# Patient Record
Sex: Male | Born: 1965
Health system: Southern US, Community
[De-identification: ages and names within clinical notes are randomized; demographics above are authoritative.]

## PROBLEM LIST (undated history)

## (undated) DIAGNOSIS — M549 Dorsalgia, unspecified: Secondary | ICD-10-CM

## (undated) DIAGNOSIS — I509 Heart failure, unspecified: Secondary | ICD-10-CM

## (undated) HISTORY — PX: CARDIAC SURGERY: SHX584

---

## 2006-12-07 ENCOUNTER — Inpatient Hospital Stay (HOSPITAL_COMMUNITY): Admission: EM | Admit: 2006-12-07 | Discharge: 2006-12-11 | Payer: Self-pay | Admitting: Emergency Medicine

## 2007-05-19 ENCOUNTER — Emergency Department (HOSPITAL_COMMUNITY): Admission: EM | Admit: 2007-05-19 | Discharge: 2007-05-19 | Payer: Self-pay | Admitting: Emergency Medicine

## 2007-07-08 ENCOUNTER — Emergency Department (HOSPITAL_COMMUNITY): Admission: EM | Admit: 2007-07-08 | Discharge: 2007-07-08 | Payer: Self-pay | Admitting: Emergency Medicine

## 2007-07-10 ENCOUNTER — Emergency Department (HOSPITAL_COMMUNITY): Admission: EM | Admit: 2007-07-10 | Discharge: 2007-07-10 | Payer: Self-pay | Admitting: Emergency Medicine

## 2007-10-11 ENCOUNTER — Emergency Department (HOSPITAL_COMMUNITY): Admission: EM | Admit: 2007-10-11 | Discharge: 2007-10-12 | Payer: Self-pay | Admitting: Emergency Medicine

## 2007-10-13 ENCOUNTER — Emergency Department (HOSPITAL_COMMUNITY): Admission: EM | Admit: 2007-10-13 | Discharge: 2007-10-13 | Payer: Self-pay | Admitting: Emergency Medicine

## 2008-02-02 ENCOUNTER — Emergency Department (HOSPITAL_COMMUNITY): Admission: EM | Admit: 2008-02-02 | Discharge: 2008-02-02 | Payer: Self-pay | Admitting: Emergency Medicine

## 2008-02-04 ENCOUNTER — Emergency Department (HOSPITAL_COMMUNITY): Admission: EM | Admit: 2008-02-04 | Discharge: 2008-02-04 | Payer: Self-pay | Admitting: Emergency Medicine

## 2008-02-07 ENCOUNTER — Emergency Department (HOSPITAL_COMMUNITY): Admission: EM | Admit: 2008-02-07 | Discharge: 2008-02-07 | Payer: Self-pay | Admitting: Emergency Medicine

## 2010-09-10 NOTE — H&P (Signed)
[name], [name]                ACCOUNT NO.:  [id number]   MEDICAL RECORD NO.:  [id number]          PATIENT TYPE:  INP   LOCATION:  5501                         FACILITY:  [facility]   PHYSICIAN:  Jonathan King, MDDATE OF BIRTH:  [birth year]   DATE OF ADMISSION:  12/07/2006  DATE OF DISCHARGE:                              HISTORY & PHYSICAL   CHIEF COMPLAINT:  Spider bite.   HISTORY OF PRESENT ILLNESS:  Jonathan King is a 45 year old relatively  healthy white male who presents to the emergency room after having  noticed an infection of his left knee several days ago. He thinks he may  have had a spider bite. His friend drained a fluid collection with a  needle after having put it over a sling.  Apparently a lot of blood and  pus was expressed at that time. Since then his knee has gotten worse.  The redness and pain has spread almost circumferentially and more  proximally than the initial area.  He is having nausea and subjective  fevers and chills. He thinks he goes to the [facility]  [facility] but cannot give me a physician's name.  He reports that  he goes to a different one every time. He has had no previous soft  tissue infections, nor joint infections. He has not seen a physician for  this problem.  He  does not recall the last time he received a tetanus  shot and is requesting a booster today.   PAST MEDICAL HISTORY:  Anxiety.   MEDICATIONS:  None.   ALLERGIES:  None.   SOCIAL HISTORY:  The patient is currently unemployed.  He drinks  occasionally but denies drinking to excess.  He does not smoke.  Denies  drug use.  He is here with, I believe, his girlfriend who reports she is  HIV positive.   REVIEW OF SYSTEMS:  CONSTITUTIONAL:  As above.  HEENT: No headache or  sore throat.  RESPIRATORY:  No shortness of breath or cough.  CARDIOVASCULAR:  No chest pains or palpitations.  GI:  As above.  GU:  No dysuria.  MUSCULOSKELETAL:  As above. SKIN:  As  above.  PSYCHIATRIC.  He is reporting feeling very panicky currently.  NEUROLOGIC:  No stroke  or seizure. ENDOCRINE:  No diabetes.  HEMATOLOGIC:  No bleeding or  clotting disorders. INFECTIOUS:  He does not know his HIV status  currently.   PHYSICAL EXAMINATION:  His temperature is 99.6 orally, blood pressure  132/93, pulse 118, respiratory rate 24, oxygen saturation 96% on room  air.  GENERAL:  The patient appears anxious.  He has a stutter.  HEENT:  Normocephalic, atraumatic.  Pupils equal, round, and reactive to  light.  NECK:  Supple.  LUNGS:  Clear to auscultation bilaterally without wheezes, rhonchi or  rales.  CARDIOVASCULAR:  Regular rate and rhythm without murmurs, gallops or  rubs.  ABDOMEN:  Normal bowel sounds, soft, nontender, nondistended.  GU/RECTAL:  Deferred.  EXTREMITIES:  He has a papule just distal to his knee with surrounding  area of intense induration and  erythema, warmth and tenderness. There is  also some erythema that expands distally and proximally, worse on the  front of the knee but extending in a patchy fashion posteriorly to the  popliteal area. I see no fluctuance. He has good range of motion of the  knee and no obvious effusion.  SKIN:  As above.  PSYCHIATRIC:  As above.  NEUROLOGIC:  Alert and oriented.  Cranial nerves and sensory motor exam  are intact.   LABS:  Have not been drawn by the ER physician. Radiology none.   ASSESSMENT AND PLAN:  1. Cellulitis of the left knee. I am concerned about an abscess, less      likely infected joint.  I will get an MRI of the knee and cover      with vancomycin IV and supportive care. I am in the hallway      currently and therefore feel it is not appropriate to discuss      getting an HIV test. The friend did actually volunteer the      information. Once he is in a more private area, I will consider      screening for need for HIV test.  2. Anxiety, p.r.n. Ativan.   In addition to above, he will need  a baseline CBC and BMET.      Jonathan L. Lendell Caprice, MD  Electronically Signed     CLS/MEDQ  D:  12/08/2006  T:  12/09/2006  Job:  [id number]   cc:   [facility], [facility]

## 2010-09-13 NOTE — Discharge Summary (Signed)
Jonathan King, Jonathan King                ACCOUNT NO.:  [id number]   MEDICAL RECORD NO.:  [id number]          PATIENT TYPE:  INP   LOCATION:  5501                         FACILITY:  [facility]   PHYSICIAN:  Barnetta Chapel, MDDATE OF BIRTH:  1965/12/04   DATE OF ADMISSION:  12/07/2006  DATE OF DISCHARGE:  12/11/2006                               DISCHARGE SUMMARY   PRIMARY CARE PHYSICIAN:  Media planner, [employer].   DISCHARGE DIAGNOSES:  Abscess/cellulitis of the anterior aspect of the  left knee status post I&D.   DISCHARGE MEDICATIONS:  1. Doxycycline 100 mg twice daily.  2. Vicodin 5/325 mg x1-2 tabs every 4 h as needed for pain.   CONSULTATIONS:  Surgical consult.   PROCEDURE:  I&D of left anterior knee abscess (this was done by the  surgical team).   IMAGING STUDIES:  The patient had an MRI of the left knee which revealed  cellulitic changes involving the anterior aspect of the knee with the  small foci of nonenhancing soft tissue, which may reflect an area of  phlegmon or necrotic tissue, but no discreet drainable soft tissue  abscess. No findings to suggest osteomyelitis. No internal derangement  of findings for septic arthritis.   BRIEF HISTORY AND HOSPITAL COURSE:  Please refer to the H&P done on  December 07, 2006. The patient is a 45 year old male who presented with an  abscess and cellulitis of the anterior aspect of the left knee following  an insect bite.   The patient was admitted to the regular medical floor. He was started on  IV antibiotics (IV Vancomycin). An MRI of the left knee was done (please  see above for the findings). A surgical consult was called for the  possible drainage of the abscess. The patient was seen by the surgical  team and had an I&D done on the left knee. The wound dressing was  continued as per the surgeon's advice. The patient's antibiotics were  continued. The patient was eventually discharged back home on p.o.  doxycycline.  The wound culture results were still pending as of the time  of discharge.   PLAN:  1. Discharge patient home on p.o. doxycycline.  2. Home health for wound dressing.  3. Activity as tolerated.  4. Regular diet.     Barnetta Chapel, MD  Electronically Signed    SIO/MEDQ  D:  01/06/2007  T:  01/06/2007  Job:  [id number]

## 2011-02-10 LAB — BASIC METABOLIC PANEL
BUN: 8
CO2: 29
Calcium: 9.5
Chloride: 102
Creatinine, Ser: 1
GFR calc Af Amer: >60
GFR calc non Af Amer: >60
Glucose, Bld: 118 — ABNORMAL HIGH
Potassium: 3.8
Sodium: 138

## 2011-02-10 LAB — DIFFERENTIAL
Basophils Absolute: 0
Basophils Relative: 0
Eosinophils Absolute: 0.1
Eosinophils Relative: 1
Lymphocytes Relative: 14
Lymphs Abs: 1.8
Monocytes Absolute: 1 — ABNORMAL HIGH
Monocytes Relative: 8
Neutro Abs: 9.9 — ABNORMAL HIGH
Neutrophils Relative %: 77

## 2011-02-10 LAB — WOUND CULTURE

## 2011-02-10 LAB — CULTURE, BLOOD (ROUTINE X 2): Culture: NO GROWTH

## 2011-02-10 LAB — CBC
HCT: 45.4
Hemoglobin: 15.5
MCHC: 34.1
MCV: 89.6
Platelets: 253
RBC: 5.07
RDW: 12.7
WBC: 12.9 — ABNORMAL HIGH

## 2012-01-25 ENCOUNTER — Emergency Department (HOSPITAL_COMMUNITY)
Admission: EM | Admit: 2012-01-25 | Discharge: 2012-01-25 | Disposition: A | Discharge status: Home / Self Care | Payer: Self-pay | Attending: Emergency Medicine | Admitting: Emergency Medicine

## 2012-01-25 ENCOUNTER — Encounter (HOSPITAL_COMMUNITY): Payer: Self-pay | Admitting: Emergency Medicine

## 2012-01-25 ENCOUNTER — Emergency Department (HOSPITAL_COMMUNITY): Payer: Self-pay

## 2012-01-25 DIAGNOSIS — M549 Dorsalgia, unspecified: Secondary | ICD-10-CM | POA: Insufficient documentation

## 2012-01-25 MED ORDER — KETOROLAC TROMETHAMINE 60 MG/2ML IM SOLN
60.0000 mg | Freq: Once | INTRAMUSCULAR | Status: AC
  Administered 2012-01-25: 60 mg via INTRAMUSCULAR
  Filled 2012-01-25: qty 2

## 2012-01-25 MED ORDER — CYCLOBENZAPRINE HCL 10 MG PO TABS: 10.0000 mg | ORAL_TABLET | Freq: Two times a day (BID) | ORAL | Status: DC | PRN

## 2012-01-25 MED ORDER — OXYCODONE-ACETAMINOPHEN 5-325 MG PO TABS: 1.0000 | ORAL_TABLET | ORAL | Status: DC | PRN

## 2012-01-25 NOTE — ED Provider Notes (Signed)
History     CSN: [id number]  Arrival date & time 01/25/12  0349   None     Chief Complaint  Patient presents with  . Back Pain    (Consider location/radiation/quality/duration/timing/severity/associated sxs/prior treatment) Patient is a 46 y.o. male presenting with back pain. The history is provided by the patient, a friend and the EMS personnel. No language interpreter was used.  Back Pain  This is a new problem. The current episode started 12 to 24 hours ago. The problem occurs constantly. The pain is associated with no known injury. The pain is present in the thoracic spine. The pain is at a severity of 8/10. The pain is moderate. The symptoms are aggravated by bending. Pertinent negatives include no chest pain, no fever, no numbness, no bowel incontinence, no perianal numbness, no bladder incontinence, no leg pain, no paresthesias, no paresis, no tingling and no weakness. He has tried NSAIDs for the symptoms.   46 year old male coming in with complaint of midthoracic pain that started around 10:30 PM last night. Denies shortness of breath or chest pain. States the pain is worse with movement and palpitation to the thoracic spine. He denies injury. He denies bowel or bladder incontinence. Patient has taken ibuprofen 1000 mg for pain. States that he did vomit x1. States that he is hungry right now and would like to eat. Patient sits up in the bed with no problems for exam. Ambulating without any problems.  History reviewed. No pertinent past medical history.  History reviewed. No pertinent past surgical history.  No family history on file.  History  Substance Use Topics  . Smoking status: Never Smoker   . Smokeless tobacco: Not on file  . Alcohol Use: No      Review of Systems  Constitutional: Negative for fever.  Cardiovascular: Negative for chest pain.  Gastrointestinal: Negative for bowel incontinence.  Genitourinary: Negative for bladder incontinence.  Musculoskeletal:  Positive for back pain.  Neurological: Negative for tingling, weakness, numbness and paresthesias.    Allergies  Review of patient's allergies indicates no known allergies.  Home Medications   Current Outpatient Rx  Name Route Sig Dispense Refill  . IBUPROFEN 200 MG PO TABS Oral Take 800 mg by mouth every 6 (six) hours as needed. For pain      BP 150/99  Pulse 66  Temp 98 F (36.7 C) (Oral)  Resp 18  Ht 6' (1.829 m)  Wt 185 lb (83.915 kg)  BMI 25.09 kg/m2  SpO2 93%  Physical Exam  Nursing note and vitals reviewed. Constitutional: He is oriented to person, place, and time. He appears well-developed and well-nourished.  HENT:  Head: Normocephalic.  Eyes: Conjunctivae normal and EOM are normal. Pupils are equal, round, and reactive to light.  Neck: Normal range of motion. Neck supple.  Cardiovascular: Normal rate.   Pulmonary/Chest: Effort normal.  Abdominal: Soft.  Musculoskeletal: Normal range of motion. He exhibits tenderness. He exhibits no edema.  Neurological: He is alert and oriented to person, place, and time.  Skin: Skin is warm and dry.  Psychiatric: He has a normal mood and affect.    ED Course  Procedures (including critical care time)  Labs Reviewed - No data to display No results found.   No diagnosis found.    MDM  46 year old male with acute onset of point tenderness to thoracic spine around 10:30 last night. Thoracic films negative for acute process in reviewed by myself. Toradol IM in the ER with prescription for Percocet  and Flexeril and instructed not to drive with him. No red flags. No cauda equina symptoms. Patient understands to return for worsening symptoms.        Remi Haggard, NP 01/25/12 [phone]

## 2012-01-25 NOTE — ED Notes (Signed)
Pt states he began having back pain late last night. Pt denies any trauma or injury. States that he has occasional back pain but never this bad.

## 2012-01-25 NOTE — ED Notes (Signed)
Pt c/o mid back pain onset last night, denies injury. Worse with movement.

## 2012-01-27 NOTE — ED Provider Notes (Signed)
Medical screening examination/treatment/procedure(s) were performed by non-physician practitioner and as supervising physician I was immediately available for consultation/collaboration.   Nadeem Romanoski, MD 01/27/12 1227 

## 2012-01-30 ENCOUNTER — Encounter (HOSPITAL_COMMUNITY): Payer: Self-pay | Admitting: Emergency Medicine

## 2012-01-30 ENCOUNTER — Emergency Department (INDEPENDENT_AMBULATORY_CARE_PROVIDER_SITE_OTHER)
Admission: EM | Admit: 2012-01-30 | Discharge: 2012-01-30 | Disposition: A | Discharge status: Nursing Home (Includes ICF) | Payer: Self-pay | Source: Home / Self Care | Attending: Emergency Medicine | Admitting: Emergency Medicine

## 2012-01-30 ENCOUNTER — Inpatient Hospital Stay (HOSPITAL_COMMUNITY)
Admission: EM | Admit: 2012-01-30 | Discharge: 2012-02-08 | DRG: 418 | Disposition: A | Discharge status: Home / Self Care | Payer: MEDICAID | Attending: General Surgery | Admitting: General Surgery

## 2012-01-30 ENCOUNTER — Observation Stay (HOSPITAL_COMMUNITY): Payer: Self-pay

## 2012-01-30 ENCOUNTER — Emergency Department (HOSPITAL_COMMUNITY): Payer: Self-pay

## 2012-01-30 DIAGNOSIS — K560 Paralytic ileus: Secondary | ICD-10-CM | POA: Diagnosis not present

## 2012-01-30 DIAGNOSIS — K810 Acute cholecystitis: Secondary | ICD-10-CM

## 2012-01-30 DIAGNOSIS — K929 Disease of digestive system, unspecified: Secondary | ICD-10-CM | POA: Diagnosis not present

## 2012-01-30 DIAGNOSIS — R112 Nausea with vomiting, unspecified: Secondary | ICD-10-CM

## 2012-01-30 DIAGNOSIS — K8000 Calculus of gallbladder with acute cholecystitis without obstruction: Principal | ICD-10-CM | POA: Diagnosis present

## 2012-01-30 DIAGNOSIS — R1011 Right upper quadrant pain: Secondary | ICD-10-CM

## 2012-01-30 DIAGNOSIS — Y836 Removal of other organ (partial) (total) as the cause of abnormal reaction of the patient, or of later complication, without mention of misadventure at the time of the procedure: Secondary | ICD-10-CM | POA: Diagnosis not present

## 2012-01-30 HISTORY — DX: Dorsalgia, unspecified: M54.9

## 2012-01-30 LAB — BASIC METABOLIC PANEL
BUN: 15 mg/dL (ref 6–23)
CO2: 27 mEq/L (ref 19–32)
Calcium: 9.3 mg/dL (ref 8.4–10.5)
Chloride: 92 mEq/L — ABNORMAL LOW (ref 96–112)
Creatinine, Ser: 0.85 mg/dL (ref 0.50–1.35)
GFR calc Af Amer: >90 mL/min (ref >90)
GFR calc non Af Amer: >90 mL/min (ref >90)
Glucose, Bld: 98 mg/dL (ref 70–99)
Potassium: 3 mEq/L — ABNORMAL LOW (ref 3.5–5.1)
Sodium: 132 mEq/L — ABNORMAL LOW (ref 135–145)

## 2012-01-30 LAB — URINE MICROSCOPIC-ADD ON

## 2012-01-30 LAB — CBC WITH DIFFERENTIAL/PLATELET
Basophils Absolute: 0.1 10*3/uL (ref 0.0–0.1)
Basophils Relative: 0 % (ref 0–1)
Eosinophils Absolute: 0.1 10*3/uL (ref 0.0–0.7)
Eosinophils Relative: 0 % (ref 0–5)
HCT: 43.4 % (ref 39.0–52.0)
Hemoglobin: 15.1 g/dL (ref 13.0–17.0)
Lymphocytes Relative: 9 % — ABNORMAL LOW (ref 12–46)
Lymphs Abs: 1.5 10*3/uL (ref 0.7–4.0)
MCH: 30.7 pg (ref 26.0–34.0)
MCHC: 34.8 g/dL (ref 30.0–36.0)
MCV: 88.2 fL (ref 78.0–100.0)
Monocytes Absolute: 1.5 10*3/uL — ABNORMAL HIGH (ref 0.1–1.0)
Monocytes Relative: 9 % (ref 3–12)
Neutro Abs: 13 10*3/uL — ABNORMAL HIGH (ref 1.7–7.7)
Neutrophils Relative %: 81 % — ABNORMAL HIGH (ref 43–77)
Platelets: 256 10*3/uL (ref 150–400)
RBC: 4.92 MIL/uL (ref 4.22–5.81)
RDW: 13.4 % (ref 11.5–15.5)
WBC: 16.1 10*3/uL — ABNORMAL HIGH (ref 4.0–10.5)

## 2012-01-30 LAB — URINALYSIS, ROUTINE W REFLEX MICROSCOPIC
Glucose, UA: NEGATIVE mg/dL
Hgb urine dipstick: NEGATIVE
Ketones, ur: 15 mg/dL — AB
Leukocytes, UA: NEGATIVE
Nitrite: NEGATIVE
Protein, ur: 100 mg/dL — AB
Specific Gravity, Urine: 1.034 — ABNORMAL HIGH (ref 1.005–1.030)
Urobilinogen, UA: 2 mg/dL — ABNORMAL HIGH (ref 0.0–1.0)
pH: 6 (ref 5.0–8.0)

## 2012-01-30 MED ORDER — POTASSIUM CHLORIDE IN NACL 20-0.9 MEQ/L-% IV SOLN
INTRAVENOUS | Status: DC
  Administered 2012-01-31: 125 mL/h via INTRAVENOUS
  Administered 2012-02-01 – 2012-02-03 (×5): via INTRAVENOUS
  Administered 2012-02-04: 75 mL/h via INTRAVENOUS
  Administered 2012-02-04: 03:00:00 via INTRAVENOUS
  Administered 2012-02-04: 75 mL/h via INTRAVENOUS
  Administered 2012-02-05: via INTRAVENOUS
  Filled 2012-01-30 (×18): qty 1000

## 2012-01-30 MED ORDER — OXYCODONE HCL 5 MG PO TABS: 5.0000 mg | ORAL_TABLET | ORAL | Status: DC | PRN

## 2012-01-30 MED ORDER — ACETAMINOPHEN 325 MG PO TABS
975.0000 mg | ORAL_TABLET | Freq: Once | ORAL | Status: AC
  Administered 2012-01-30: 975 mg via ORAL
  Filled 2012-01-30: qty 3

## 2012-01-30 MED ORDER — SODIUM CHLORIDE 0.9 % IV SOLN
3.0000 g | Freq: Once | INTRAVENOUS | Status: AC
  Administered 2012-01-30: 3 g via INTRAVENOUS
  Filled 2012-01-30: qty 3

## 2012-01-30 MED ORDER — ACETAMINOPHEN 650 MG RE SUPP: 650.0000 mg | Freq: Four times a day (QID) | RECTAL | Status: DC | PRN

## 2012-01-30 MED ORDER — HYDROMORPHONE HCL PF 1 MG/ML IJ SOLN
1.0000 mg | INTRAMUSCULAR | Status: DC | PRN
  Administered 2012-01-31: 1 mg via INTRAVENOUS
  Filled 2012-01-30: qty 1

## 2012-01-30 MED ORDER — PANTOPRAZOLE SODIUM 40 MG IV SOLR
40.0000 mg | Freq: Every day | INTRAVENOUS | Status: DC
  Administered 2012-01-31 – 2012-02-06 (×8): 40 mg via INTRAVENOUS
  Filled 2012-01-30 (×10): qty 40

## 2012-01-30 MED ORDER — ACETAMINOPHEN 325 MG PO TABS
650.0000 mg | ORAL_TABLET | Freq: Four times a day (QID) | ORAL | Status: DC | PRN
  Administered 2012-02-01: 650 mg via ORAL
  Filled 2012-01-30: qty 2

## 2012-01-30 MED ORDER — ONDANSETRON HCL 4 MG/2ML IJ SOLN
4.0000 mg | Freq: Four times a day (QID) | INTRAMUSCULAR | Status: DC | PRN
  Administered 2012-02-02: 4 mg via INTRAVENOUS
  Filled 2012-01-30: qty 2

## 2012-01-30 MED ORDER — ONDANSETRON 4 MG PO TBDP
8.0000 mg | ORAL_TABLET | Freq: Once | ORAL | Status: AC
  Administered 2012-01-30: 8 mg via ORAL

## 2012-01-30 MED ORDER — PIPERACILLIN-TAZOBACTAM 3.375 G IVPB
3.3750 g | Freq: Three times a day (TID) | INTRAVENOUS | Status: DC
  Administered 2012-01-31 – 2012-02-07 (×21): 3.375 g via INTRAVENOUS
  Filled 2012-01-30 (×28): qty 50

## 2012-01-30 MED ORDER — ONDANSETRON 4 MG PO TBDP
ORAL_TABLET | ORAL | Status: AC
  Filled 2012-01-30: qty 2

## 2012-01-30 MED ORDER — SODIUM CHLORIDE 0.9 % IV SOLN
Freq: Once | INTRAVENOUS | Status: AC
  Administered 2012-01-30: 18:00:00 via INTRAVENOUS

## 2012-01-30 NOTE — ED Notes (Signed)
Pt sent here from [facility] for further eval of RLQ pain with N/V starting Sunday

## 2012-01-30 NOTE — ED Notes (Addendum)
C/O N/V since Sunday. Was seen early Sunday am at [facility] for back pain. This started after leaving the ED. Has had N/V and mild right flank pain. States flank pain has improved. Denies diarrhea or constipation.

## 2012-01-30 NOTE — ED Provider Notes (Signed)
History     CSN: [id number]  Arrival date & time 01/30/12  1109   First MD Initiated Contact with Patient 01/30/12 1327      Chief Complaint  Patient presents with  . Emesis    (Consider location/radiation/quality/duration/timing/severity/associated sxs/prior treatment) Patient is a 46 y.o. male presenting with vomiting. The history is provided by the patient.  Emesis  Associated symptoms include abdominal pain. Pertinent negatives include no chills and no diarrhea.  Patient reports decreased appetite with vomiting since Sunday.  States evaluated in ER on 01/25/12 and was prescribed flexeril and percocet for back pain.  Pt states back pain resolved, but now has right sided pain with continued nausea and vomiting.  Currently not taking medications for symptoms other than home ibuprofen which mildly relieves pain.  No history of same.  Pain is worse with movement.  Food does not trigger vomiting episodes.  Denies UTI symptoms.    Past Medical History  Diagnosis Date  . Back pain     History reviewed. No pertinent past surgical history.  No family history on file.  History  Substance Use Topics  . Smoking status: Never Smoker   . Smokeless tobacco: Not on file  . Alcohol Use: No      Review of Systems  Constitutional: Positive for appetite change. Negative for chills and fatigue.  Respiratory: Negative.   Cardiovascular: Negative.   Gastrointestinal: Positive for nausea, vomiting and abdominal pain. Negative for diarrhea and constipation.  Genitourinary: Negative.     Allergies  Review of patient's allergies indicates no known allergies.  Home Medications   No current outpatient prescriptions on file.  BP 149/93  Pulse 102  Temp 98.9 F (37.2 C) (Oral)  Resp 18  SpO2 100%  Physical Exam  Nursing note and vitals reviewed. Constitutional: He is oriented to person, place, and time. Vital signs are normal. He appears well-developed and well-nourished. He is  active and cooperative.  HENT:  Head: Normocephalic.  Mouth/Throat: Oropharynx is clear and moist. No oropharyngeal exudate.  Eyes: Conjunctivae normal are normal. Pupils are equal, round, and reactive to light. No scleral icterus.  Neck: Trachea normal and normal range of motion. Neck supple.  Cardiovascular: Normal rate, regular rhythm and normal heart sounds.   Pulmonary/Chest: Effort normal and breath sounds normal.  Abdominal: Soft. Bowel sounds are normal. There is tenderness in the right upper quadrant and right lower quadrant. There is no rebound and no CVA tenderness.  Lymphadenopathy:    He has no cervical adenopathy.  Neurological: He is alert and oriented to person, place, and time. No cranial nerve deficit or sensory deficit.  Skin: Skin is warm and dry. No rash noted.  Psychiatric: He has a normal mood and affect. His speech is normal and behavior is normal. Judgment and thought content normal. Cognition and memory are normal.    ED Course  Procedures (including critical care time)     1. Nausea and vomiting   2. RUQ abdominal pain       MDM  Transfer to [facility] for further evaluation and management of RUQ pain and vomiting.         Johnsie Kindred, NP 02/01/12 1427

## 2012-01-30 NOTE — ED Notes (Signed)
Pt waiting for ultrasound. Pt has family at bedside. Pt is alert, pt anxious about being here.

## 2012-01-30 NOTE — H&P (Signed)
Jonathan King is an 46 y.o. male.   Chief Complaint: Right upper quadrant abdominal pain HPI: This is a 46 year old gentleman who presents with a six-day history of sharp, right upper quadrant abdominal pain. He has had some nausea and vomiting with this. He reports the pain is moderate in intensity and refers through to the back. Bowel movements have been normal. He has no prior history of similar discomfort. He has no other complaints.  Past Medical History  Diagnosis Date  . Back pain     History reviewed. No pertinent past surgical history.  History reviewed. No pertinent family history. Social History:  reports that he has never smoked. He does not have any smokeless tobacco history on file. He reports that he does not drink alcohol or use illicit drugs.  Allergies: No Known Allergies   (Not in a hospital admission)  Results for orders placed during the hospital encounter of 01/30/12 (from the past 48 hour(s))  CBC WITH DIFFERENTIAL     Status: Abnormal   Collection Time   01/30/12  3:02 PM      Component Value Range Comment   WBC 16.1 (*) 4.0 - 10.5 K/uL    RBC 4.92  4.22 - 5.81 MIL/uL    Hemoglobin 15.1  13.0 - 17.0 g/dL    HCT 98.1  19.1 - 47.8 %    MCV 88.2  78.0 - 100.0 fL    MCH 30.7  26.0 - 34.0 pg    MCHC 34.8  30.0 - 36.0 g/dL    RDW 29.5  62.1 - 30.8 %    Platelets 256  150 - 400 K/uL    Neutrophils Relative 81 (*) 43 - 77 %    Neutro Abs 13.0 (*) 1.7 - 7.7 K/uL    Lymphocytes Relative 9 (*) 12 - 46 %    Lymphs Abs 1.5  0.7 - 4.0 K/uL    Monocytes Relative 9  3 - 12 %    Monocytes Absolute 1.5 (*) 0.1 - 1.0 K/uL    Eosinophils Relative 0  0 - 5 %    Eosinophils Absolute 0.1  0.0 - 0.7 K/uL    Basophils Relative 0  0 - 1 %    Basophils Absolute 0.1  0.0 - 0.1 K/uL   BASIC METABOLIC PANEL     Status: Abnormal   Collection Time   01/30/12  3:02 PM      Component Value Range Comment   Sodium 132 (*) 135 - 145 mEq/L    Potassium 3.0 (*) 3.5 - 5.1 mEq/L    Chloride 92 (*) 96 - 112 mEq/L    CO2 27  19 - 32 mEq/L    Glucose, Bld 98  70 - 99 mg/dL    BUN 15  6 - 23 mg/dL    Creatinine, Ser 6.57  0.50 - 1.35 mg/dL    Calcium 9.3  8.4 - 84.6 mg/dL    GFR calc non Af Amer >90  >90 mL/min    GFR calc Af Amer >90  >90 mL/min   URINALYSIS, ROUTINE W REFLEX MICROSCOPIC     Status: Abnormal   Collection Time   01/30/12  4:06 PM      Component Value Range Comment   Color, Urine AMBER (*) YELLOW BIOCHEMICALS MAY BE AFFECTED BY COLOR   APPearance CLEAR  CLEAR    Specific Gravity, Urine 1.034 (*) 1.005 - 1.030    pH 6.0  5.0 - 8.0  Glucose, UA NEGATIVE  NEGATIVE mg/dL    Hgb urine dipstick NEGATIVE  NEGATIVE    Bilirubin Urine MODERATE (*) NEGATIVE    Ketones, ur 15 (*) NEGATIVE mg/dL    Protein, ur 161 (*) NEGATIVE mg/dL    Urobilinogen, UA 2.0 (*) 0.0 - 1.0 mg/dL    Nitrite NEGATIVE  NEGATIVE    Leukocytes, UA NEGATIVE  NEGATIVE   URINE MICROSCOPIC-ADD ON     Status: Normal   Collection Time   01/30/12  4:06 PM      Component Value Range Comment   Squamous Epithelial / LPF RARE  RARE    RBC / HPF 0-2  <3 RBC/hpf    Bacteria, UA RARE  RARE    Urine-Other AMORPHOUS URATES/PHOSPHATES      US Abdomen Complete  01/30/2012  *RADIOLOGY REPORT*  Clinical Data:  Right upper quadrant pain.  Nausea, vomiting.  COMPLETE ABDOMINAL ULTRASOUND  Comparison:  None.  Findings:  Gallbladder:  Gallbladder wall is thickened, measuring 9 mm. Layers of edema are identified within the gallbladder wall.There is gallbladder sludge.  No definite stones are identified.  No sonographic Murphy's sign seen.  Common bile duct:  7.2 mm.  No choledocholithiasis identified.  Liver:  Liver is echogenic without focal lesion.  There is poor visualization of the diaphragm and internal hepatic structures.  IVC:  Appears normal.  Pancreas:  Pancreas is not well seen because of overlying bowel gas.  Spleen:  8.2 cm, normal appearance.  Right Kidney:  13.3 cm, normal appearance.  Left  Kidney:  12.9 cm, normal in appearance.  Abdominal aorta:  Proximal aorta is not aneurysmal.  Distal aorta is not well seen because of bowel gas.  IMPRESSION:  1.  Significant gallbladder wall thickening and gallbladder sludge are present.  No sonographic Murphy's sign.  The findings are nonspecific but include acalculus cholecystitis. 2.  Fatty liver. 3.  No evidence for hydronephrosis.   Original Report Authenticated By: Jonathan King, M.D.     Review of Systems  Constitutional: Positive for fever.  HENT: Negative.   Eyes: Negative.   Respiratory: Negative.   Cardiovascular: Negative.   Gastrointestinal: Positive for nausea, vomiting and abdominal pain. Negative for diarrhea, constipation and blood in stool.  Genitourinary: Negative.   Musculoskeletal: Negative.   Skin: Negative.   Neurological: Negative.   Endo/Heme/Allergies: Negative.   Psychiatric/Behavioral: Negative.     Blood pressure 127/82, pulse 115, temperature 101.9 F (38.8 C), temperature source Oral, resp. rate 18, SpO2 93.00%. Physical Exam  Constitutional: He is oriented to person, place, and time. He appears well-developed and well-nourished. No distress.       He is flushed in appearance  HENT:  Head: Normocephalic and atraumatic.  Right Ear: External ear normal.  Left Ear: External ear normal.  Nose: Nose normal.  Mouth/Throat: Oropharynx is clear and moist. No oropharyngeal exudate.  Eyes: Conjunctivae normal and EOM are normal. Pupils are equal, round, and reactive to light. Right eye exhibits no discharge. Left eye exhibits no discharge. No scleral icterus.  Neck: Normal range of motion. Neck supple. No tracheal deviation present. No thyromegaly present.  Cardiovascular: Normal rate, regular rhythm, normal heart sounds and intact distal pulses.   No murmur heard. Respiratory: Effort normal and breath sounds normal. No respiratory distress. He has no wheezes. He has no rales.  GI: Soft. Bowel sounds are  normal. There is tenderness.       There is mild tenderness with guarding in the right  upper quadrant. The rest of the abdomen is soft and nontender  Musculoskeletal: Normal range of motion. He exhibits no edema and no tenderness.  Lymphadenopathy:    He has no cervical adenopathy.  Neurological: He is alert and oriented to person, place, and time.  Skin: No rash noted. He is diaphoretic. No erythema.  Psychiatric: His behavior is normal. Thought content normal.     Assessment/Plan Acute cholecystitis  Plan will be to admit her to the hospital and start him on IV antibiotics with plans for eventual laparoscopic cholecystectomy. I will check an EKG and chest x-ray preoperatively. I discussed this with him and he is in agreement.  Chrisanne Loose A 01/30/2012, 7:32 PM

## 2012-01-30 NOTE — ED Notes (Signed)
Patient transported to X-ray 

## 2012-01-30 NOTE — ED Provider Notes (Signed)
History   This chart was scribed for Gerhard Munch, MD by Melba Coon. The patient was seen in room TR06C/TR06C and the patient's care was started at 5:12PM.    CSN: [id number]  Arrival date & time 01/30/12  1455   First MD Initiated Contact with Patient 01/30/12 1640      Chief Complaint  Patient presents with  . Flank Pain  . Abdominal Pain    (Consider location/radiation/quality/duration/timing/severity/associated sxs/prior treatment) The history is provided by the patient. No language interpreter was used.   Jonathan King is a 46 y.o. male who presents to the Emergency Department complaining of constant, moderate to severe abdominal and flank pain with an onset 6 days ago. He was seen here at [facility] ED for back pain but present symptoms started after he left. He was at Urgent Care this morning and was given Zofran which slightly alleviated the nausea. Reports vomit this morning and continuous nausea. Nausea is sometimes worse after eating. Ibuprofen did not alleviate the pain. No HA, confusion, disorientation, fever, neck pain, sore throat, rash, CP, SOB, diarrhea, dysuria, scrotal or penile pain, or extremity pain, edema, weakness, numbness, or tingling. No known allergies. No other pertinent medical symptoms.   Past Medical History  Diagnosis Date  . Back pain     History reviewed. No pertinent past surgical history.  History reviewed. No pertinent family history.  History  Substance Use Topics  . Smoking status: Never Smoker   . Smokeless tobacco: Not on file  . Alcohol Use: No      Review of Systems 10 Systems reviewed and all are negative for acute change except as noted in the HPI.   Allergies  Review of patient's allergies indicates no known allergies.  Home Medications   Current Outpatient Rx  Name Route Sig Dispense Refill  . IBUPROFEN 200 MG PO TABS Oral Take 600 mg by mouth every 6 (six) hours as needed. For pain      BP 127/82  Pulse  115  Temp 101.9 F (38.8 C) (Oral)  Resp 18  SpO2 93%  Physical Exam  Nursing note and vitals reviewed. Constitutional: He is oriented to person, place, and time. He appears well-developed and well-nourished. No distress.  HENT:  Head: Normocephalic and atraumatic.  Eyes: EOM are normal.  Neck: Neck supple. No tracheal deviation present.  Cardiovascular: Normal rate.   Pulmonary/Chest: Effort normal. No respiratory distress.  Abdominal: There is tenderness (RUQ and RLQ tenderness).  Musculoskeletal: Normal range of motion.  Neurological: He is alert and oriented to person, place, and time.  Skin: Skin is warm and dry.  Psychiatric: He has a normal mood and affect. His behavior is normal.    ED Course  Procedures (including critical care time)  COORDINATION OF CARE:  5:17PM - abd ultrasound, BMP, CBC with diff, and UA will be ordered for the pt. 6:35PM - imaging and lab results reviewed and is c/w cholecystitis.   Labs Reviewed  CBC WITH DIFFERENTIAL - Abnormal; Notable for the following:    WBC 16.1 (*)     Neutrophils Relative 81 (*)     Neutro Abs 13.0 (*)     Lymphocytes Relative 9 (*)     Monocytes Absolute 1.5 (*)     All other components within normal limits  BASIC METABOLIC PANEL - Abnormal; Notable for the following:    Sodium 132 (*)     Potassium 3.0 (*)     Chloride 92 (*)  All other components within normal limits  URINALYSIS, ROUTINE W REFLEX MICROSCOPIC - Abnormal; Notable for the following:    Color, Urine AMBER (*)  BIOCHEMICALS MAY BE AFFECTED BY COLOR   Specific Gravity, Urine 1.034 (*)     Bilirubin Urine MODERATE (*)     Ketones, ur 15 (*)     Protein, ur 100 (*)     Urobilinogen, UA 2.0 (*)     All other components within normal limits  URINE MICROSCOPIC-ADD ON   US Abdomen Complete  01/30/2012  *RADIOLOGY REPORT*  Clinical Data:  Right upper quadrant pain.  Nausea, vomiting.  COMPLETE ABDOMINAL ULTRASOUND  Comparison:  None.  Findings:   Gallbladder:  Gallbladder wall is thickened, measuring 9 mm. Layers of edema are identified within the gallbladder wall.There is gallbladder sludge.  No definite stones are identified.  No sonographic Murphy's sign seen.  Common bile duct:  7.2 mm.  No choledocholithiasis identified.  Liver:  Liver is echogenic without focal lesion.  There is poor visualization of the diaphragm and internal hepatic structures.  IVC:  Appears normal.  Pancreas:  Pancreas is not well seen because of overlying bowel gas.  Spleen:  8.2 cm, normal appearance.  Right Kidney:  13.3 cm, normal appearance.  Left Kidney:  12.9 cm, normal in appearance.  Abdominal aorta:  Proximal aorta is not aneurysmal.  Distal aorta is not well seen because of bowel gas.  IMPRESSION:  1.  Significant gallbladder wall thickening and gallbladder sludge are present.  No sonographic Murphy's sign.  The findings are nonspecific but include acalculus cholecystitis. 2.  Fatty liver. 3.  No evidence for hydronephrosis.   Original Report Authenticated By: Patterson Hammersmith, M.D.      No diagnosis found.    MDM  I personally performed the services described in this documentation, which was scribed in my presence. The recorded information has been reviewed and considered.  This previously well male presents with new nausea, vomiting, right upper quadrant pain.  On exam he is tender to palpation about the right upper and lower quadrants, tachycardic, febrile.  Given these findings is concern for cholecystitis, though other GI pathology was considered.  Ultrasound demonstrates findings consistent with a calculus cholecystitis.  Given the constellation of findings, I discussed the case with our surgeon on call, Dr. Magnus Ivan, who will see the patient  Gerhard Munch, MD 01/30/12 1932

## 2012-01-31 ENCOUNTER — Observation Stay (HOSPITAL_COMMUNITY): Payer: Self-pay | Admitting: Anesthesiology

## 2012-01-31 ENCOUNTER — Encounter (HOSPITAL_COMMUNITY): Payer: Self-pay | Admitting: Anesthesiology

## 2012-01-31 ENCOUNTER — Encounter (HOSPITAL_COMMUNITY): Admission: EM | Disposition: A | Discharge status: Home / Self Care | Payer: Self-pay | Source: Home / Self Care

## 2012-01-31 HISTORY — PX: CHOLECYSTECTOMY: SHX55

## 2012-01-31 LAB — CBC
HCT: 36.6 % — ABNORMAL LOW (ref 39.0–52.0)
Hemoglobin: 12.5 g/dL — ABNORMAL LOW (ref 13.0–17.0)
MCH: 30 pg (ref 26.0–34.0)
MCHC: 34.2 g/dL (ref 30.0–36.0)
MCV: 87.8 fL (ref 78.0–100.0)
Platelets: 236 10*3/uL (ref 150–400)
RBC: 4.17 MIL/uL — ABNORMAL LOW (ref 4.22–5.81)
RDW: 13.6 % (ref 11.5–15.5)
WBC: 14.6 10*3/uL — ABNORMAL HIGH (ref 4.0–10.5)

## 2012-01-31 LAB — TYPE AND SCREEN
ABO/RH(D): O POS
Antibody Screen: NEGATIVE

## 2012-01-31 LAB — COMPREHENSIVE METABOLIC PANEL
ALT: 107 U/L — ABNORMAL HIGH (ref 0–53)
AST: 80 U/L — ABNORMAL HIGH (ref 0–37)
Albumin: 2.4 g/dL — ABNORMAL LOW (ref 3.5–5.2)
Alkaline Phosphatase: 95 U/L (ref 39–117)
BUN: 13 mg/dL (ref 6–23)
CO2: 25 mEq/L (ref 19–32)
Calcium: 8.2 mg/dL — ABNORMAL LOW (ref 8.4–10.5)
Chloride: 98 mEq/L (ref 96–112)
Creatinine, Ser: 0.94 mg/dL (ref 0.50–1.35)
GFR calc Af Amer: >90 mL/min (ref >90)
GFR calc non Af Amer: >90 mL/min (ref >90)
Glucose, Bld: 96 mg/dL (ref 70–99)
Potassium: 2.9 mEq/L — ABNORMAL LOW (ref 3.5–5.1)
Sodium: 133 mEq/L — ABNORMAL LOW (ref 135–145)
Total Bilirubin: 1.1 mg/dL (ref 0.3–1.2)
Total Protein: 6.5 g/dL (ref 6.0–8.3)

## 2012-01-31 LAB — SURGICAL PCR SCREEN
MRSA, PCR: NEGATIVE
Staphylococcus aureus: NEGATIVE

## 2012-01-31 LAB — ABO/RH: ABO/RH(D): O POS

## 2012-01-31 SURGERY — LAPAROSCOPIC CHOLECYSTECTOMY
Anesthesia: General | Site: Abdomen | Wound class: Dirty or Infected

## 2012-01-31 MED ORDER — LIDOCAINE HCL (CARDIAC) 20 MG/ML IV SOLN
INTRAVENOUS | Status: DC | PRN
  Administered 2012-01-31: 100 mg via INTRAVENOUS

## 2012-01-31 MED ORDER — ACETAMINOPHEN 10 MG/ML IV SOLN
1000.0000 mg | Freq: Once | INTRAVENOUS | Status: AC
  Administered 2012-01-31: 1000 mg via INTRAVENOUS

## 2012-01-31 MED ORDER — SODIUM CHLORIDE 0.9 % IR SOLN
Status: DC | PRN
  Administered 2012-01-31: 3000 mL

## 2012-01-31 MED ORDER — HYDROMORPHONE HCL PF 1 MG/ML IJ SOLN
1.0000 mg | INTRAMUSCULAR | Status: DC | PRN
  Administered 2012-02-02 (×2): 1 mg via INTRAVENOUS
  Administered 2012-02-03 (×2): 2 mg via INTRAVENOUS
  Administered 2012-02-03 (×2): 1 mg via INTRAVENOUS
  Administered 2012-02-04: 2 mg via INTRAVENOUS
  Administered 2012-02-04 – 2012-02-05 (×5): 1 mg via INTRAVENOUS
  Filled 2012-01-31: qty 2
  Filled 2012-01-31: qty 1
  Filled 2012-01-31: qty 2
  Filled 2012-01-31 (×6): qty 1
  Filled 2012-01-31: qty 2
  Filled 2012-01-31 (×2): qty 1

## 2012-01-31 MED ORDER — HYDROMORPHONE HCL PF 1 MG/ML IJ SOLN
0.2500 mg | INTRAMUSCULAR | Status: DC | PRN
  Administered 2012-01-31 (×4): 0.5 mg via INTRAVENOUS

## 2012-01-31 MED ORDER — MIDAZOLAM HCL 5 MG/5ML IJ SOLN
INTRAMUSCULAR | Status: DC | PRN
  Administered 2012-01-31: 2 mg via INTRAVENOUS

## 2012-01-31 MED ORDER — MIDAZOLAM HCL 2 MG/2ML IJ SOLN: 0.5000 mg | Freq: Once | INTRAMUSCULAR | Status: AC | PRN

## 2012-01-31 MED ORDER — SUCCINYLCHOLINE CHLORIDE 20 MG/ML IJ SOLN
INTRAMUSCULAR | Status: DC | PRN
  Administered 2012-01-31: 100 mg via INTRAVENOUS

## 2012-01-31 MED ORDER — GLYCOPYRROLATE 0.2 MG/ML IJ SOLN
INTRAMUSCULAR | Status: DC | PRN
  Administered 2012-01-31: .4 mg via INTRAVENOUS

## 2012-01-31 MED ORDER — NEOSTIGMINE METHYLSULFATE 1 MG/ML IJ SOLN
INTRAMUSCULAR | Status: DC | PRN
  Administered 2012-01-31: 3 mg via INTRAVENOUS

## 2012-01-31 MED ORDER — LACTATED RINGERS IV SOLN
INTRAVENOUS | Status: DC | PRN
  Administered 2012-01-31 (×2): via INTRAVENOUS

## 2012-01-31 MED ORDER — FENTANYL CITRATE 0.05 MG/ML IJ SOLN
INTRAMUSCULAR | Status: DC | PRN
  Administered 2012-01-31: 250 ug via INTRAVENOUS
  Administered 2012-01-31: 100 ug via INTRAVENOUS
  Administered 2012-01-31: 50 ug via INTRAVENOUS

## 2012-01-31 MED ORDER — ACETAMINOPHEN 10 MG/ML IV SOLN
1000.0000 mg | Freq: Four times a day (QID) | INTRAVENOUS | Status: DC
  Filled 2012-01-31 (×2): qty 100

## 2012-01-31 MED ORDER — BUPIVACAINE-EPINEPHRINE 0.25% -1:200000 IJ SOLN
INTRAMUSCULAR | Status: DC | PRN
  Administered 2012-01-31: 20 mL

## 2012-01-31 MED ORDER — PROMETHAZINE HCL 25 MG/ML IJ SOLN
6.2500 mg | INTRAMUSCULAR | Status: DC | PRN
  Administered 2012-01-31: 6.25 mg via INTRAVENOUS

## 2012-01-31 MED ORDER — PROPOFOL 10 MG/ML IV BOLUS
INTRAVENOUS | Status: DC | PRN
  Administered 2012-01-31: 150 mg via INTRAVENOUS

## 2012-01-31 MED ORDER — ONDANSETRON HCL 4 MG/2ML IJ SOLN
INTRAMUSCULAR | Status: DC | PRN
  Administered 2012-01-31: 4 mg via INTRAVENOUS

## 2012-01-31 MED ORDER — OXYCODONE-ACETAMINOPHEN 5-325 MG PO TABS
1.0000 | ORAL_TABLET | ORAL | Status: DC | PRN
  Administered 2012-01-31 – 2012-02-07 (×14): 2 via ORAL
  Filled 2012-01-31 (×13): qty 2

## 2012-01-31 MED ORDER — VECURONIUM BROMIDE 10 MG IV SOLR
INTRAVENOUS | Status: DC | PRN
  Administered 2012-01-31: 5 mg via INTRAVENOUS
  Administered 2012-01-31: 2 mg via INTRAVENOUS

## 2012-01-31 MED ORDER — POTASSIUM CHLORIDE 10 MEQ/100ML IV SOLN
10.0000 meq | INTRAVENOUS | Status: AC
  Administered 2012-01-31 (×2): 10 meq via INTRAVENOUS
  Filled 2012-01-31 (×3): qty 100

## 2012-01-31 MED ORDER — MEPERIDINE HCL 25 MG/ML IJ SOLN: 6.2500 mg | INTRAMUSCULAR | Status: DC | PRN

## 2012-01-31 SURGICAL SUPPLY — 53 items
APL SKNCLS STERI-STRIP NONHPOA (GAUZE/BANDAGES/DRESSINGS) ×2
APPLIER CLIP 5 13 M/L LIGAMAX5 (MISCELLANEOUS) ×3
APPLIER CLIP ROT 10 11.4 M/L (STAPLE) ×3
APR CLP MED LRG 11.4X10 (STAPLE) ×2
APR CLP MED LRG 5 ANG JAW (MISCELLANEOUS) ×2
BAG SPEC RTRVL LRG 6X4 10 (ENDOMECHANICALS) ×2
BANDAGE ADHESIVE 1X3 (GAUZE/BANDAGES/DRESSINGS) ×9 IMPLANT
BENZOIN TINCTURE PRP APPL 2/3 (GAUZE/BANDAGES/DRESSINGS) ×3 IMPLANT
BLADE SURG ROTATE 9660 (MISCELLANEOUS) IMPLANT
CANISTER SUCTION 2500CC (MISCELLANEOUS) ×3 IMPLANT
CHLORAPREP W/TINT 26ML (MISCELLANEOUS) ×3 IMPLANT
CLIP APPLIE 5 13 M/L LIGAMAX5 (MISCELLANEOUS) ×2 IMPLANT
CLIP APPLIE ROT 10 11.4 M/L (STAPLE) ×1 IMPLANT
CLOTH BEACON ORANGE TIMEOUT ST (SAFETY) ×3 IMPLANT
CLSR STERI-STRIP ANTIMIC 1/2X4 (GAUZE/BANDAGES/DRESSINGS) ×2 IMPLANT
COVER MAYO STAND STRL (DRAPES) ×3 IMPLANT
COVER SURGICAL LIGHT HANDLE (MISCELLANEOUS) ×3 IMPLANT
DECANTER SPIKE VIAL GLASS SM (MISCELLANEOUS) ×3 IMPLANT
DRAIN CHANNEL 19F RND (DRAIN) ×2 IMPLANT
DRAPE C-ARM 42X72 X-RAY (DRAPES) ×3 IMPLANT
DRAPE UTILITY 15X26 W/TAPE STR (DRAPE) ×6 IMPLANT
DRSG TEGADERM 4X4.75 (GAUZE/BANDAGES/DRESSINGS) ×3 IMPLANT
ELECT REM PT RETURN 9FT ADLT (ELECTROSURGICAL) ×3
ELECTRODE REM PT RTRN 9FT ADLT (ELECTROSURGICAL) ×2 IMPLANT
ENDOLOOP SUT PDS II  0 18 (SUTURE) ×2
ENDOLOOP SUT PDS II 0 18 (SUTURE) ×2 IMPLANT
EVACUATOR SILICONE 100CC (DRAIN) ×2 IMPLANT
GAUZE SPONGE 2X2 8PLY STRL LF (GAUZE/BANDAGES/DRESSINGS) ×2 IMPLANT
GLOVE BIOGEL M STRL SZ7.5 (GLOVE) ×3 IMPLANT
GLOVE BIOGEL PI IND STRL 8 (GLOVE) ×2 IMPLANT
GLOVE BIOGEL PI INDICATOR 8 (GLOVE) ×1
GOWN PREVENTION PLUS XXLARGE (GOWN DISPOSABLE) ×3 IMPLANT
GOWN STRL NON-REIN LRG LVL3 (GOWN DISPOSABLE) ×9 IMPLANT
HEMOSTAT SNOW SURGICEL 2X4 (HEMOSTASIS) ×2 IMPLANT
KIT BASIN OR (CUSTOM PROCEDURE TRAY) ×3 IMPLANT
KIT ROOM TURNOVER OR (KITS) ×3 IMPLANT
NS IRRIG 1000ML POUR BTL (IV SOLUTION) ×3 IMPLANT
PAD ARMBOARD 7.5X6 YLW CONV (MISCELLANEOUS) ×3 IMPLANT
POUCH SPECIMEN RETRIEVAL 10MM (ENDOMECHANICALS) ×3 IMPLANT
SCISSORS LAP 5X35 DISP (ENDOMECHANICALS) IMPLANT
SET CHOLANGIOGRAPH 5 50 .035 (SET/KITS/TRAYS/PACK) ×3 IMPLANT
SET IRRIG TUBING LAPAROSCOPIC (IRRIGATION / IRRIGATOR) ×3 IMPLANT
SLEEVE ENDOPATH XCEL 5M (ENDOMECHANICALS) ×6 IMPLANT
SPECIMEN JAR SMALL (MISCELLANEOUS) ×3 IMPLANT
SPONGE GAUZE 2X2 STER 10/PKG (GAUZE/BANDAGES/DRESSINGS) ×1
SUT ETHILON 3 0 PS 1 (SUTURE) ×2 IMPLANT
SUT MNCRL AB 4-0 PS2 18 (SUTURE) ×3 IMPLANT
TAPE CLOTH SURG 4X10 WHT LF (GAUZE/BANDAGES/DRESSINGS) ×2 IMPLANT
TOWEL OR 17X24 6PK STRL BLUE (TOWEL DISPOSABLE) ×3 IMPLANT
TOWEL OR 17X26 10 PK STRL BLUE (TOWEL DISPOSABLE) ×3 IMPLANT
TRAY LAPAROSCOPIC (CUSTOM PROCEDURE TRAY) ×3 IMPLANT
TROCAR XCEL BLUNT TIP 100MML (ENDOMECHANICALS) ×3 IMPLANT
TROCAR XCEL NON-BLD 5MMX100MML (ENDOMECHANICALS) ×3 IMPLANT

## 2012-01-31 NOTE — Transfer of Care (Signed)
Immediate Anesthesia Transfer of Care Note  Patient: Jonathan King  Procedure(s) Performed: Procedure(s) (LRB) with comments: LAPAROSCOPIC CHOLECYSTECTOMY (N/A)  Patient Location: PACU  Anesthesia Type: General  Level of Consciousness: awake, alert  and oriented  Airway & Oxygen Therapy: Patient Spontanous Breathing and Patient connected to nasal cannula oxygen  Post-op Assessment: Report given to PACU RN and Post -op Vital signs reviewed and stable  Post vital signs: Reviewed and stable  Complications: No apparent anesthesia complications

## 2012-01-31 NOTE — Preoperative (Signed)
Beta Blockers   Reason not to administer Beta Blockers:Not Applicable 

## 2012-01-31 NOTE — Progress Notes (Signed)
RUQ pain since last Sunday, constant, n/v. Took some NSAIDs for pain. +anorexia. +bm. No prior symptoms. No aggravating/relieving factors  Denies cp, sob, pnd, orthopnea, TIA, amaruosis fugax, tobacco-alcohol-drug use. Denies melena, hematochezia. O/w 10 point ROS negative  Reviewed Dr Eliberto Ivory note  Alert, nad, stutters ox3 cta  Reg Soft, nd, RUQ TTP to deep palpation o/w nontender. No RT/guarding No jaundice, scleral icterus  Labs & imaging reviewed.   A/p: Fever Leukocytosis Elevated transaminases; abnormal appearance of GB on u/s. All consistent acute cholecystitis. Doubt PUD, appendicitis, pancreatitis.  I believe the patient's symptoms are consistent with gallbladder disease.  We discussed gallbladder disease.  We discussed non-operative and operative management.   I discussed laparoscopic cholecystectomy with IOC and possible open cholecystectomy in detail.  The patient was shown diagrams detailing the procedure.  We discussed the risks and benefits of a laparoscopic cholecystectomy including, but not limited to bleeding, infection, injury to surrounding structures such as the intestine or liver, bile leak, retained gallstones, drain placement, need to convert to an open procedure, prolonged diarrhea, blood clots such as  DVT, common bile duct injury, anesthesia risks, and possible need for additional procedures.  We discussed the typical post-operative recovery course. I explained that the likelihood of improvement of their symptoms is good.  He has elected to proceed to the OR  Jonathan King. Andrey Campanile, MD, FACS General, Bariatric, & Minimally Invasive Surgery [facility], [place]

## 2012-01-31 NOTE — Brief Op Note (Signed)
01/30/2012 - 01/31/2012  2:27 PM  PATIENT:  Jonathan King  46 y.o. male  PRE-OPERATIVE DIAGNOSIS:  cholecystitis  POST-OPERATIVE DIAGNOSIS:  acute gangrenous  cholecystitis  PROCEDURE:  Procedure(s) (LRB) with comments: LAPAROSCOPIC CHOLECYSTECTOMY (N/A)  SURGEON:  Surgeon(s) and Role:    * Atilano Ina, MD,FACS - Primary    * Robyne Askew, MD - Assisting  PHYSICIAN ASSISTANT: none  ASSISTANTS: see above   ANESTHESIA:   general  EBL:  Total I/O In: 1000 [I.V.:1000] Out: -   BLOOD ADMINISTERED:none  DRAINS: (19Fr) Jackson-Pratt drain(s) with closed bulb suction in the RUQ/GB fossa   LOCAL MEDICATIONS USED:  OTHER 0.25% marcaine with epi  SPECIMEN:  Source of Specimen:  necrotic gallbladder  DISPOSITION OF SPECIMEN:  PATHOLOGY  COUNTS:  YES  TOURNIQUET:  * No tourniquets in log *  DICTATION: .Other Dictation: Dictation Number [id number]  PLAN OF CARE: return to floor after PACU  PATIENT DISPOSITION:  PACU - hemodynamically stable.   Delay start of Pharmacological VTE agent (>24hrs) due to surgical blood loss or risk of bleeding: yes  Mary Sella. Andrey Campanile, MD, FACS General, Bariatric, & Minimally Invasive Surgery [facility], [place]

## 2012-01-31 NOTE — Op Note (Signed)
Jonathan King, Jonathan King                ACCOUNT NO.:  [id number]  MEDICAL RECORD NO.:  [id number]  LOCATION:  6N21C                        FACILITY:  [facility]  PHYSICIAN:  Mary Sella. Andrey Campanile, MD, FACSDATE OF BIRTH:  1965/09/24  DATE OF PROCEDURE:  01/31/2012 DATE OF DISCHARGE:                              OPERATIVE REPORT   PREOPERATIVE DIAGNOSIS:  Acute cholecystitis.  POSTOPERATIVE DIAGNOSIS:  Acute gangrenous cholecystitis.  PROCEDURE:  Laparoscopic cholecystectomy.  SURGEON:  Mary Sella. Andrey Campanile, MD, FACS  ASSISTANT SURGEON:  Ollen Gross. Vernell Morgans, MD.  ANESTHESIA:  General plus local consisting 0.25% Marcaine with epi.  DRAINS:  A 19-French Jackson-Pratt drain in the right upper quadrant and the gallbladder fossa.  SPECIMEN:  Gallbladder.  FINDINGS:  The patient had a walled-off gallbladder.  There was omentum on top of it.  Once peeling the omentum off, large amount of pus came pouring out of the dome of the gallbladder.  The gallbladder was eventually freed.  Two pieces of Surgicel SNoW were left in the gallbladder fossa as well as on top of the omentum.  The drain was placed.  INDICATIONS FOR PROCEDURE:  The patient is a 46 year old Caucasian male who developed right upper quadrant pain last Sunday.  The pain was persistent.  He had no prior symptoms.  He did have some nausea and some occasional emesis.  The pain was persistent, so he came to the emergency room yesterday where an ultrasound was performed which demonstrated gallbladder wall thickening as well as thick sludge.  He had an elevated white blood cell count and a mild elevation of his transaminases.  He was admitted and placed on IV antibiotics.  We discussed different management options such as IV antibiotics.  First was IV antibiotics with percutaneous drain versus cholecystectomy.  We discussed the pros and cons of each.  We discussed the risks and benefits of surgery including, but not limited to, bleeding, infection,  injury to surrounding structures, need to convert to an open procedure, bile leak, prolonged diarrhea, need for temporary drain placement, blood clot formation, need to convert to an open procedure, incisional hernia, injury to the common bile duct requiring major reconstructive bile duct surgery, retained gallstones, potential need for ERCP as well as the typical postoperative course.  He elected to proceed to surgery.  The patient was taken to the operating room at [facility] after obtaining informed consent.  He was placed supine on the operating room table.  General endotracheal anesthesia was established.  Sequential compression devices had already been placed.  He was on therapeutic antibiotics.  Surgical time-out was performed.  His abdomen was prepped and draped in usual standard surgical fashion.  Local was infiltrated at the base of his umbilicus.  Next, a 1.5 cm vertical infraumbilical incision was made with a #11 blade.  The fascia was grasped and lifted anteriorly.  Next, the fascia was incised with a #11 blade.  A 0 Vicryl suture was placed around the fascial edges in a pursestring fashion. Next, a 12-mm trocar was placed under direct visualization into the abdominal cavity.  Pneumoperitoneum was smoothly established to patient pressure of 15 mmHg.  The laparoscope was advanced and  the abdominal cavity was surveilled.  There was no evidence of injury to surrounding structures.  The right upper quadrant was visualized.  There was a lot of omentum that was lying on top of the liver what appeared to be the gallbladder.  The patient was placed in reverse Trendelenburg and 5 mm trocars were placed in the subxiphoid position and 2 in the right hypochondrium.  This was all done under direct visualization after local been infiltrated.  My partner grabbed the omentum and I lifted up on the liver.  Thus we were able to peel the omentum away from what appeared to be the  gallbladder.  In doing so, there was a large expression of a lot of purulent material.  Then using the suction irrigator catheter, I used hydrodissection as well as blunt dissection to peel the omentum off the body of the gallbladder.  At this point Dr. Carolynne Edouard was able to grab the gallbladder and retracted towards the shoulder.  I then continued to circumferentially try to free the surrounding inflammatory tissue from the gallbladder.  There was a fair amount of bleeding from the omental surface.  I had essentially tried to wall off the gallbladder.  It was essentially very raw.  We were approaching what appeared to be the infundibulum.  I came across a small 2 mm blood vessel that appeared to be going into the gallbladder.  It was clipped proximally with 2 clips and transected distally with a clip and then cut in the middle with EndoShear.  I continued to do some additional blunt hydrodissection as well as with the suction catheter.  All in all about 20-30 minutes was done using this.  I identified what appeared to be the neck of the gallbladder.  I was able to circumferentially get around it with a [place] and doing, there was some spillage of pus from the infundibulum of the gallbladder at this point.  There was one stone that spilled and it was excised, retracted and removed from the abdominal cavity.  The 5- mm subxiphoid trocar was up sized to a 12-mm trocar.  We continued to irrigate and with the suction irrigator catheter to help with dissection.  Again, I was able to get around what appeared to be the neck and proximal cystic duct.  As it entered the gallbladder with the [place].  There did not appear to be any other structures entering the gallbladder.  At this point I felt that it was safe to potentially transect the gallbladder at this location.  There was good posterior window behind this before I could see the liver and again there did not appear to be any other structures  entering the gallbladder.  I transected the neck of the gallbladder with EndoShear.  My partner then grabbed the biliary aspect of the remaining aspect of the gallbladder and I placed 2 PDS Endoloop around the neck of the gallbladder.  We then started mobilizing the gallbladder up at the gallbladder fossa. Essentially, the posterior wall was fused to the liver and it was difficult to differentiate between gallbladder wall and liver capsule and parenchyma.  There was still a fair amount of pus in the gallbladder.  We were able to eventually free the gallbladder, but it appeared that some of the posterior wall was left in the gallbladder fossa.  An EndoCatch bag was advanced to the subxiphoid trocar and the gallbladder was placed within the specimen bag.  It was then removed from the Hasson trocar.  Pneumoperitoneum  was reestablished.  I then irrigated the right upper quadrant.  I bovied the gallbladder fossa with hook electrocautery.  I then irrigated again the omentum that had been plastered to the gallbladder.  There did not appear to be any direct hemorrhage.  It was just essentially very raw.  I ended up using a total of 3 L of irrigation.  I then placed 2 pieces of Surgicel SNoW, one in the gallbladder fossa and one on top of the raw omentum.  We then placed a 19-French drain in the right upper quadrant and brought it out through the most lateral right-sided abdominal trocar.  Pneumoperitoneum was released and the drain was connected to the bulb.  However, we could not get adequate suction on the drains despite numerous attempts.  We eventually reestablished pneumoperitoneum and removed that drain and placed a new drain and brought it out in a similar fashion.  The drain was placed in the right upper quadrant and in the pericolic gutter and in the gallbladder fossa.  It was secured to the bulb and we had adequate suction at this point.  Pneumoperitoneum was released.  It should be  noted that prior to doing this, the Hasson trocar had been removed prior and a pursestring suture had been tied down thus obliterating the fascial defect.  There was no air leak at the umbilicus and there was nothing within our closure.  The drain was secured to the skin with a 2-0 nylon suture.  The remaining incisions were closed with a 4-0 Monocryl in a subcuticular fashion followed by application of benzoin, Steri-Strips, and sterile bandages. The patient was extubated and taken to recovery room in stable condition.  There were no immediate complications.  The patient tolerated the procedure well.     Mary Sella. Andrey Campanile, MD, FACS     EMW/MEDQ  D:  01/31/2012  T:  01/31/2012  Job:  [id number]

## 2012-01-31 NOTE — Anesthesia Preprocedure Evaluation (Signed)
Anesthesia Evaluation  Patient identified by MRN, date of birth, ID band Patient awake    Reviewed: Allergy & Precautions, H&P , NPO status , Patient's Chart, lab work & pertinent test results, reviewed documented beta blocker date and time   History of Anesthesia Complications Negative for: history of anesthetic complications  Airway Mallampati: II TM Distance: >3 FB Neck ROM: Full    Dental  (+) Teeth Intact and Dental Advisory Given   Pulmonary neg pulmonary ROS,  breath sounds clear to auscultation  Pulmonary exam normal       Cardiovascular negative cardio ROS  Rhythm:Regular Rate:Normal     Neuro/Psych negative neurological ROS  negative psych ROS   GI/Hepatic Increased LFTs with acute chole N/V with acute cholecystitis   Endo/Other  negative endocrine ROS  Renal/GU negative Renal ROS     Musculoskeletal   Abdominal   Peds  Hematology negative hematology ROS (+)   Anesthesia Other Findings   Reproductive/Obstetrics                           Anesthesia Physical Anesthesia Plan  ASA: I and Emergent  Anesthesia Plan: General   Post-op Pain Management:    Induction: Intravenous and Rapid sequence  Airway Management Planned: Oral ETT  Additional Equipment:   Intra-op Plan:   Post-operative Plan: Extubation in OR  Informed Consent: I have reviewed the patients History and Physical, chart, labs and discussed the procedure including the risks, benefits and alternatives for the proposed anesthesia with the patient or authorized representative who has indicated his/her understanding and acceptance.   Dental advisory given  Plan Discussed with: Surgeon and CRNA  Anesthesia Plan Comments: (Plan routine monitors, GETA with RSI)        Anesthesia Quick Evaluation

## 2012-01-31 NOTE — Progress Notes (Signed)
Patient ID: Jonathan King, male   DOB: Jan 23, 1966, 46 y.o.   MRN: [id number]    Subjective: Pt is scared and crying.  He states that when he has pain its on the right side, but is not having any pain currently.  Objective: Vital signs in last 24 hours: Temp:  [98.9 F (37.2 C)-102.8 F (39.3 C)] 99.8 F (37.7 C) (10/05 0700) Pulse Rate:  [102-115] 111  (10/05 0547) Resp:  [18] 18  (10/05 0547) BP: (116-149)/(77-95) 116/77 mmHg (10/05 0547) SpO2:  [93 %-100 %] 93 % (10/05 0547) Last BM Date: 01/30/12  Intake/Output from previous day: 10/04 0701 - 10/05 0700 In: 460.4 [I.V.:460.4] Out: -  Intake/Output this shift:    PE: Abd: soft, essentially NT right now, +BS, ND Ht: regular rhythm, but slightly tachy Lungs: CTAB  Lab Results:   Basename 01/30/12 1502  WBC 16.1*  HGB 15.1  HCT 43.4  PLT 256   BMET  Basename 01/30/12 1502  NA 132*  K 3.0*  CL 92*  CO2 27  GLUCOSE 98  BUN 15  CREATININE 0.85  CALCIUM 9.3   PT/INR No results found for this basename: LABPROT:2,INR:2 in the last 72 hours CMP     Component Value Date/Time   NA 132* 01/30/2012 1502   K 3.0* 01/30/2012 1502   CL 92* 01/30/2012 1502   CO2 27 01/30/2012 1502   GLUCOSE 98 01/30/2012 1502   BUN 15 01/30/2012 1502   CREATININE 0.85 01/30/2012 1502   CALCIUM 9.3 01/30/2012 1502   GFRNONAA >90 01/30/2012 1502   GFRAA >90 01/30/2012 1502   Lipase  No results found for this basename: lipase       Studies/Results: Dg Chest 2 View  01/30/2012  *RADIOLOGY REPORT*  Clinical Data: Fever.  Pre cholecystectomy evaluation.  CHEST - 2 VIEW  Comparison: Thoracic spine radiographs dated 01/25/2012.  Findings: Normal sized heart.  Linear density at the right lung base and in the left lower lung zone.  Poor depth of inspiration. Stable mild scoliosis.  IMPRESSION: Poor inspiration with bibasilar atelectasis or scarring, greater on the right.   Original Report Authenticated By: Darrol Angel, M.D.    US Abdomen  Complete  01/30/2012  *RADIOLOGY REPORT*  Clinical Data:  Right upper quadrant pain.  Nausea, vomiting.  COMPLETE ABDOMINAL ULTRASOUND  Comparison:  None.  Findings:  Gallbladder:  Gallbladder wall is thickened, measuring 9 mm. Layers of edema are identified within the gallbladder wall.There is gallbladder sludge.  No definite stones are identified.  No sonographic Murphy's sign seen.  Common bile duct:  7.2 mm.  No choledocholithiasis identified.  Liver:  Liver is echogenic without focal lesion.  There is poor visualization of the diaphragm and internal hepatic structures.  IVC:  Appears normal.  Pancreas:  Pancreas is not well seen because of overlying bowel gas.  Spleen:  8.2 cm, normal appearance.  Right Kidney:  13.3 cm, normal appearance.  Left Kidney:  12.9 cm, normal in appearance.  Abdominal aorta:  Proximal aorta is not aneurysmal.  Distal aorta is not well seen because of bowel gas.  IMPRESSION:  1.  Significant gallbladder wall thickening and gallbladder sludge are present.  No sonographic Murphy's sign.  The findings are nonspecific but include acalculus cholecystitis. 2.  Fatty liver. 3.  No evidence for hydronephrosis.   Original Report Authenticated By: Patterson Hammersmith, M.D.     Anti-infectives: Anti-infectives     Start     Dose/Rate Route Frequency  Ordered Stop   01/30/12 2300  piperacillin-tazobactam (ZOSYN) IVPB 3.375 g       3.375 g 12.5 mL/hr over 240 Minutes Intravenous Every 8 hours 01/30/12 2210     01/30/12 1845   Ampicillin-Sulbactam (UNASYN) 3 g in sodium chloride 0.9 % 100 mL IVPB        3 g 100 mL/hr over 60 Minutes Intravenous  Once 01/30/12 1835 01/30/12 1947           Assessment/Plan  1. Right sided abd pain, ? Cholecystitis 2. Fever 3. Leukocytosis 4. Anxiety  Plan: 1. Keep NPO, await labs.  Patient has not had a CMET yet.  He has urine bilirubin and proteins in his urine.  Would like to see what TB is. 2. Patient does not act or present like a  typical gallbladder.  Will d/w MD prior to pursuing lap chole.  LOS: 1 day    Samie Barclift E 01/31/2012, 8:36 AM Pager: [phone]

## 2012-01-31 NOTE — Anesthesia Procedure Notes (Signed)
Procedure Name: Intubation Date/Time: 01/31/2012 12:43 PM Performed by: Marena Chancy Pre-anesthesia Checklist: Patient identified, Timeout performed, Emergency Drugs available, Suction available and Patient being monitored Patient Re-evaluated:Patient Re-evaluated prior to inductionOxygen Delivery Method: Circle system utilized Preoxygenation: Pre-oxygenation with 100% oxygen Intubation Type: IV induction, Rapid sequence and Cricoid Pressure applied Laryngoscope Size: Miller and 2 Grade View: Grade I Tube type: Oral Tube size: 7.5 mm Number of attempts: 1 Placement Confirmation: ETT inserted through vocal cords under direct vision,  breath sounds checked- equal and bilateral and positive ETCO2 Secured at: 23 cm Tube secured with: Tape Dental Injury: Teeth and Oropharynx as per pre-operative assessment

## 2012-01-31 NOTE — Anesthesia Postprocedure Evaluation (Signed)
  Anesthesia Post-op Note  Patient: Jonathan King  Procedure(s) Performed: Procedure(s) (LRB) with comments: LAPAROSCOPIC CHOLECYSTECTOMY (N/A)  Patient Location: PACU  Anesthesia Type: General  Level of Consciousness: sedated, patient cooperative and responds to stimulation and voice  Airway and Oxygen Therapy: Patient Spontanous Breathing and Patient connected to nasal cannula oxygen  Post-op Pain: none  Post-op Assessment: Post-op Vital signs reviewed, Patient's Cardiovascular Status Stable, Respiratory Function Stable, Patent Airway, No signs of Nausea or vomiting and Pain level controlled  Post-op Vital Signs: Reviewed and stable  Complications: No apparent anesthesia complications

## 2012-02-01 ENCOUNTER — Encounter (HOSPITAL_COMMUNITY): Payer: Self-pay | Admitting: *Deleted

## 2012-02-01 LAB — COMPREHENSIVE METABOLIC PANEL
ALT: 77 U/L — ABNORMAL HIGH (ref 0–53)
AST: 49 U/L — ABNORMAL HIGH (ref 0–37)
Albumin: 2 g/dL — ABNORMAL LOW (ref 3.5–5.2)
Alkaline Phosphatase: 78 U/L (ref 39–117)
BUN: 7 mg/dL (ref 6–23)
CO2: 27 mEq/L (ref 19–32)
Calcium: 7.7 mg/dL — ABNORMAL LOW (ref 8.4–10.5)
Chloride: 97 mEq/L (ref 96–112)
Creatinine, Ser: 0.82 mg/dL (ref 0.50–1.35)
GFR calc Af Amer: >90 mL/min (ref >90)
GFR calc non Af Amer: >90 mL/min (ref >90)
Glucose, Bld: 143 mg/dL — ABNORMAL HIGH (ref 70–99)
Potassium: 3.4 mEq/L — ABNORMAL LOW (ref 3.5–5.1)
Sodium: 132 mEq/L — ABNORMAL LOW (ref 135–145)
Total Bilirubin: 0.9 mg/dL (ref 0.3–1.2)
Total Protein: 5.9 g/dL — ABNORMAL LOW (ref 6.0–8.3)

## 2012-02-01 LAB — CBC
HCT: 36.1 % — ABNORMAL LOW (ref 39.0–52.0)
Hemoglobin: 11.9 g/dL — ABNORMAL LOW (ref 13.0–17.0)
MCH: 29.6 pg (ref 26.0–34.0)
MCHC: 33 g/dL (ref 30.0–36.0)
MCV: 89.8 fL (ref 78.0–100.0)
Platelets: 270 10*3/uL (ref 150–400)
RBC: 4.02 MIL/uL — ABNORMAL LOW (ref 4.22–5.81)
RDW: 14.2 % (ref 11.5–15.5)
WBC: 22.2 10*3/uL — ABNORMAL HIGH (ref 4.0–10.5)

## 2012-02-01 NOTE — Progress Notes (Signed)
Patient ID: Jonathan King, male   DOB: 11-Dec-1965, 46 y.o.   MRN: [id number] 1 Day Post-Op  Subjective: Pt c/o pain, but otherwise ok.  Ready to try some liquids  Objective: Vital signs in last 24 hours: Temp:  [98.2 F (36.8 C)-100.2 F (37.9 C)] 99.3 F (37.4 C) (10/06 0559) Pulse Rate:  [96-125] 112  (10/06 0559) Resp:  [17-20] 20  (10/06 0559) BP: (117-149)/(74-89) 142/86 mmHg (10/06 0559) SpO2:  [91 %-98 %] 93 % (10/06 0559) FiO2 (%):  [3 %] 3 % (10/05 1857) Weight:  [185 lb (83.915 kg)] 185 lb (83.915 kg) (10/06 0207) Last BM Date: 01/30/12  Intake/Output from previous day: 10/05 0701 - 10/06 0700 In: 3710 [P.O.:600; I.V.:3110] Out: 595 [Urine:425; Drains:170] Intake/Output this shift:    PE: Abd: soft, tender in RUQ, drain with serosang output.  +BS, incisions c/d/i  Lab Results:   Basename 02/01/12 0640 01/31/12 0750  WBC 22.2* 14.6*  HGB 11.9* 12.5*  HCT 36.1* 36.6*  PLT 270 236   BMET  Basename 02/01/12 0640 01/31/12 0840  NA 132* 133*  K 3.4* 2.9*  CL 97 98  CO2 27 25  GLUCOSE 143* 96  BUN 7 13  CREATININE 0.82 0.94  CALCIUM 7.7* 8.2*   PT/INR No results found for this basename: LABPROT:2,INR:2 in the last 72 hours CMP     Component Value Date/Time   NA 132* 02/01/2012 0640   K 3.4* 02/01/2012 0640   CL 97 02/01/2012 0640   CO2 27 02/01/2012 0640   GLUCOSE 143* 02/01/2012 0640   BUN 7 02/01/2012 0640   CREATININE 0.82 02/01/2012 0640   CALCIUM 7.7* 02/01/2012 0640   PROT 5.9* 02/01/2012 0640   ALBUMIN 2.0* 02/01/2012 0640   AST 49* 02/01/2012 0640   ALT 77* 02/01/2012 0640   ALKPHOS 78 02/01/2012 0640   BILITOT 0.9 02/01/2012 0640   GFRNONAA >90 02/01/2012 0640   GFRAA >90 02/01/2012 0640   Lipase  No results found for this basename: lipase       Studies/Results: Dg Chest 2 View  01/30/2012  *RADIOLOGY REPORT*  Clinical Data: Fever.  Pre cholecystectomy evaluation.  CHEST - 2 VIEW  Comparison: Thoracic spine radiographs dated 01/25/2012.   Findings: Normal sized heart.  Linear density at the right lung base and in the left lower lung zone.  Poor depth of inspiration. Stable mild scoliosis.  IMPRESSION: Poor inspiration with bibasilar atelectasis or scarring, greater on the right.   Original Report Authenticated By: Darrol Angel, M.D.    US Abdomen Complete  01/30/2012  *RADIOLOGY REPORT*  Clinical Data:  Right upper quadrant pain.  Nausea, vomiting.  COMPLETE ABDOMINAL ULTRASOUND  Comparison:  None.  Findings:  Gallbladder:  Gallbladder wall is thickened, measuring 9 mm. Layers of edema are identified within the gallbladder wall.There is gallbladder sludge.  No definite stones are identified.  No sonographic Murphy's sign seen.  Common bile duct:  7.2 mm.  No choledocholithiasis identified.  Liver:  Liver is echogenic without focal lesion.  There is poor visualization of the diaphragm and internal hepatic structures.  IVC:  Appears normal.  Pancreas:  Pancreas is not well seen because of overlying bowel gas.  Spleen:  8.2 cm, normal appearance.  Right Kidney:  13.3 cm, normal appearance.  Left Kidney:  12.9 cm, normal in appearance.  Abdominal aorta:  Proximal aorta is not aneurysmal.  Distal aorta is not well seen because of bowel gas.  IMPRESSION:  1.  Significant  gallbladder wall thickening and gallbladder sludge are present.  No sonographic Murphy's sign.  The findings are nonspecific but include acalculus cholecystitis. 2.  Fatty liver. 3.  No evidence for hydronephrosis.   Original Report Authenticated By: Patterson Hammersmith, M.D.     Anti-infectives: Anti-infectives     Start     Dose/Rate Route Frequency Ordered Stop   01/30/12 2300  piperacillin-tazobactam (ZOSYN) IVPB 3.375 g       3.375 g 12.5 mL/hr over 240 Minutes Intravenous Every 8 hours 01/30/12 2210     01/30/12 1845   Ampicillin-Sulbactam (UNASYN) 3 g in sodium chloride 0.9 % 100 mL IVPB        3 g 100 mL/hr over 60 Minutes Intravenous  Once 01/30/12 1835 01/30/12  1947           Assessment/Plan  1. S/p lap chole for acute cholecystitis  Plan: 1. Cont IV abx given the amount of infection patient was noted to have at time of surgery 2. Clear liquids and advance as tolerates 3. Mobilize and pulm toilet 4. Cont JP drain and abx for 1 week per Andrey Campanile.   LOS: 2 days    Jennavecia Schwier E 02/01/2012, 8:51 AM Pager: [phone]

## 2012-02-01 NOTE — Progress Notes (Signed)
Pt's Temp. Is 101.4.Dr Magnus Ivan notified.

## 2012-02-01 NOTE — Progress Notes (Signed)
Looks ok No n/v C/o pain  Drain -serosang Expected mild TTP  S/p Lap chole for gangrenous cholecystitis Drain stays in for 1 week Monitor for bile leak Cont ABX for gangrenous purulent GB - needs 1 week of abx  Suprena Travaglini M. Andrey Campanile, MD, FACS General, Bariatric, & Minimally Invasive Surgery [facility], [place]

## 2012-02-02 ENCOUNTER — Inpatient Hospital Stay (HOSPITAL_COMMUNITY): Payer: MEDICAID

## 2012-02-02 ENCOUNTER — Encounter (HOSPITAL_COMMUNITY): Payer: Self-pay | Admitting: General Surgery

## 2012-02-02 LAB — COMPREHENSIVE METABOLIC PANEL
ALT: 52 U/L (ref 0–53)
AST: 32 U/L (ref 0–37)
Albumin: 2.1 g/dL — ABNORMAL LOW (ref 3.5–5.2)
Alkaline Phosphatase: 105 U/L (ref 39–117)
BUN: 10 mg/dL (ref 6–23)
CO2: 28 mEq/L (ref 19–32)
Calcium: 8.5 mg/dL (ref 8.4–10.5)
Chloride: 95 mEq/L — ABNORMAL LOW (ref 96–112)
Creatinine, Ser: 0.84 mg/dL (ref 0.50–1.35)
GFR calc Af Amer: >90 mL/min (ref >90)
GFR calc non Af Amer: >90 mL/min (ref >90)
Glucose, Bld: 133 mg/dL — ABNORMAL HIGH (ref 70–99)
Potassium: 4 mEq/L (ref 3.5–5.1)
Sodium: 132 mEq/L — ABNORMAL LOW (ref 135–145)
Total Bilirubin: 0.5 mg/dL (ref 0.3–1.2)
Total Protein: 6.8 g/dL (ref 6.0–8.3)

## 2012-02-02 LAB — CBC
HCT: 37.7 % — ABNORMAL LOW (ref 39.0–52.0)
HCT: 37.5 % — ABNORMAL LOW (ref 39.0–52.0)
Hemoglobin: 12.3 g/dL — ABNORMAL LOW (ref 13.0–17.0)
Hemoglobin: 12.1 g/dL — ABNORMAL LOW (ref 13.0–17.0)
MCH: 29.5 pg (ref 26.0–34.0)
MCH: 29.5 pg (ref 26.0–34.0)
MCHC: 32.6 g/dL (ref 30.0–36.0)
MCHC: 32.3 g/dL (ref 30.0–36.0)
MCV: 90.4 fL (ref 78.0–100.0)
MCV: 91.5 fL (ref 78.0–100.0)
Platelets: 338 10*3/uL (ref 150–400)
Platelets: 374 10*3/uL (ref 150–400)
RBC: 4.17 MIL/uL — ABNORMAL LOW (ref 4.22–5.81)
RBC: 4.1 MIL/uL — ABNORMAL LOW (ref 4.22–5.81)
RDW: 14.5 % (ref 11.5–15.5)
RDW: 14.4 % (ref 11.5–15.5)
WBC: 30.9 10*3/uL — ABNORMAL HIGH (ref 4.0–10.5)
WBC: 30.8 10*3/uL — ABNORMAL HIGH (ref 4.0–10.5)

## 2012-02-02 MED ORDER — IOHEXOL 300 MG/ML  SOLN
20.0000 mL | INTRAMUSCULAR | Status: AC
  Administered 2012-02-02: 20 mL via ORAL

## 2012-02-02 MED ORDER — SODIUM CHLORIDE 0.9 % IJ SOLN
INTRAMUSCULAR | Status: AC
  Administered 2012-02-02: 10 mL
  Filled 2012-02-02: qty 10

## 2012-02-02 MED ORDER — METOPROLOL TARTRATE 1 MG/ML IV SOLN
10.0000 mg | Freq: Once | INTRAVENOUS | Status: AC
  Administered 2012-02-02: 10 mg via INTRAVENOUS
  Filled 2012-02-02: qty 10

## 2012-02-02 MED ORDER — ALBUTEROL SULFATE (5 MG/ML) 0.5% IN NEBU
2.5000 mg | INHALATION_SOLUTION | RESPIRATORY_TRACT | Status: DC | PRN
  Administered 2012-02-02: 2.5 mg via RESPIRATORY_TRACT
  Filled 2012-02-02: qty 0.5

## 2012-02-02 NOTE — Progress Notes (Signed)
Patient ID: Jonathan King, male   DOB: [birth year], 46 y.o.   MRN: [id number] 2 Days Post-Op  Subjective: Pt c/o pain esp in abd, was nauseated this am, feeling very hot and sweating, denies BM and only minimal flatus  Objective: Vital signs in last 24 hours: Temp:  [99.2 F (37.3 C)-101.4 F (38.6 C)] 99.8 F (37.7 C) (10/07 0529) Pulse Rate:  [108-115] 110  (10/07 0529) Resp:  [18-19] 19  (10/07 0529) BP: (123-135)/(73-85) 124/85 mmHg (10/07 0529) SpO2:  [90 %-94 %] 92 % (10/07 0529) Last BM Date: 01/30/12  Intake/Output from previous day: 10/06 0701 - 10/07 0700 In: 3327 [P.O.:1400; I.V.:1927] Out: 1340 [Urine:1250; Drains:90] Intake/Output this shift:    PE: Abd: distended, tender throughout but esp in RUQ, drain with serosang output.  Very faint bs, incisions c/d/i  Lab Results:   Basename 02/02/12 0640 02/01/12 0640  WBC 30.9* 22.2*  HGB 12.3* 11.9*  HCT 37.7* 36.1*  PLT 338 270   BMET  Basename 02/01/12 0640 01/31/12 0840  NA 132* 133*  K 3.4* 2.9*  CL 97 98  CO2 27 25  GLUCOSE 143* 96  BUN 7 13  CREATININE 0.82 0.94  CALCIUM 7.7* 8.2*   PT/INR No results found for this basename: LABPROT:2,INR:2 in the last 72 hours CMP     Component Value Date/Time   NA 132* 02/01/2012 0640   K 3.4* 02/01/2012 0640   CL 97 02/01/2012 0640   CO2 27 02/01/2012 0640   GLUCOSE 143* 02/01/2012 0640   BUN 7 02/01/2012 0640   CREATININE 0.82 02/01/2012 0640   CALCIUM 7.7* 02/01/2012 0640   PROT 5.9* 02/01/2012 0640   ALBUMIN 2.0* 02/01/2012 0640   AST 49* 02/01/2012 0640   ALT 77* 02/01/2012 0640   ALKPHOS 78 02/01/2012 0640   BILITOT 0.9 02/01/2012 0640   GFRNONAA >90 02/01/2012 0640   GFRAA >90 02/01/2012 0640   Lipase  No results found for this basename: lipase       Studies/Results: No results found.  Anti-infectives: Anti-infectives     Start     Dose/Rate Route Frequency Ordered Stop   01/30/12 2300   piperacillin-tazobactam (ZOSYN) IVPB 3.375 g        3.375  g 12.5 mL/hr over 240 Minutes Intravenous Every 8 hours 01/30/12 2210     01/30/12 1845   Ampicillin-Sulbactam (UNASYN) 3 g in sodium chloride 0.9 % 100 mL IVPB        3 g 100 mL/hr over 60 Minutes Intravenous  Once 01/30/12 1835 01/30/12 1947           Assessment/Plan  1. POD#2- lap chole for acute cholecystitis: febrile today and abd more distended, also WBC up to 30 now, abd tender  --Make NPO  --Get abd CT  --Continue IV abx (zosyn)  --Will follow up after CT scan     LOS: 3 days    Vianny Schraeder 02/02/2012, 8:50 AM Pager: [phone]

## 2012-02-02 NOTE — Progress Notes (Signed)
Patient arrived from [address] to 3316 per MD order. VS 132/105,97.8,113,21,94% on NRB, pt diaphoretic, complaints of mild discomfort in abdomen when sitting up straight in the bed. Patient A&O. Will continue to monitor.

## 2012-02-02 NOTE — Progress Notes (Signed)
Report called to 3300  

## 2012-02-02 NOTE — Progress Notes (Signed)
O2 sat 70% RA, placed on O2 5L [place]  02 sat 85%, placed on non rebreather O2 sat 95%, pt very clammy,sweaty,pale. Clance Boll PA notified at this time. See new orders.

## 2012-02-02 NOTE — Progress Notes (Signed)
Attempted to put NGT down, with help of another RN, patient screamed and pulled out tube after it was 1/2 way down. Patient upset, calmed him down, and made him comfortable. Will continue to monitor patient.

## 2012-02-02 NOTE — Progress Notes (Signed)
Patient acutely decompensating this am with fevers and increased WBC.  Abd distended but soft.  Tachycardic and diaphoretic.  STAT CXR revealed distended stomach.  Pt refusing NG for decompression.  Will put on broad spectrum antibiotics prophylactically and get cultures.  Will transfer to stepdown for closer monitoring.

## 2012-02-03 LAB — BASIC METABOLIC PANEL
BUN: 12 mg/dL (ref 6–23)
CO2: 28 mEq/L (ref 19–32)
Calcium: 8.1 mg/dL — ABNORMAL LOW (ref 8.4–10.5)
Chloride: 100 mEq/L (ref 96–112)
Creatinine, Ser: 0.76 mg/dL (ref 0.50–1.35)
GFR calc Af Amer: >90 mL/min (ref >90)
GFR calc non Af Amer: >90 mL/min (ref >90)
Glucose, Bld: 119 mg/dL — ABNORMAL HIGH (ref 70–99)
Potassium: 3.9 mEq/L (ref 3.5–5.1)
Sodium: 136 mEq/L (ref 135–145)

## 2012-02-03 LAB — CBC
HCT: 36 % — ABNORMAL LOW (ref 39.0–52.0)
Hemoglobin: 11.9 g/dL — ABNORMAL LOW (ref 13.0–17.0)
MCH: 29.9 pg (ref 26.0–34.0)
MCHC: 33.1 g/dL (ref 30.0–36.0)
MCV: 90.5 fL (ref 78.0–100.0)
Platelets: 406 10*3/uL — ABNORMAL HIGH (ref 150–400)
RBC: 3.98 MIL/uL — ABNORMAL LOW (ref 4.22–5.81)
RDW: 14.7 % (ref 11.5–15.5)
WBC: 25.5 10*3/uL — ABNORMAL HIGH (ref 4.0–10.5)

## 2012-02-03 MED ORDER — METOPROLOL TARTRATE 1 MG/ML IV SOLN
5.0000 mg | Freq: Four times a day (QID) | INTRAVENOUS | Status: DC | PRN
  Filled 2012-02-03: qty 5

## 2012-02-03 MED ORDER — SODIUM CHLORIDE 0.9 % IJ SOLN
INTRAMUSCULAR | Status: AC
  Administered 2012-02-03: 10 mL
  Filled 2012-02-03: qty 10

## 2012-02-03 MED ORDER — WHITE PETROLATUM GEL
Status: AC
  Administered 2012-02-03: 0.2
  Filled 2012-02-03: qty 5

## 2012-02-03 NOTE — Progress Notes (Signed)
Patient ID: Jonathan King, male   DOB: March 11, 1966, 46 y.o.   MRN: [id number] 3 Days Post-Op  Subjective: Pt c/o less pain, no nausea.  No flatus or BM's  Objective: Vital signs in last 24 hours: Temp:  [97.8 F (36.6 C)-99.8 F (37.7 C)] 99.2 F (37.3 C) (10/08 0806) Pulse Rate:  [105-128] 117  (10/08 0806) Resp:  [20-26] 21  (10/08 0806) BP: (109-139)/(79-105) 123/91 mmHg (10/08 0806) SpO2:  [69 %-97 %] 92 % (10/08 0806) FiO2 (%):  [100 %] 100 % (10/07 1214) Weight:  [220 lb 7.4 oz (100 kg)] 220 lb 7.4 oz (100 kg) (10/07 1400) Last BM Date: 01/30/12  Intake/Output from previous day: 10/07 0701 - 10/08 0700 In: 1675 [I.V.:1625; IV Piggyback:50] Out: 750 [Urine:725; Drains:25] Intake/Output this shift: Total I/O In: 375 [I.V.:375] Out: 150 [Urine:150]  PE: Gen: NAD CV: tachycardic Abd: less distended, min tenderness, drain with dark serosang output.  incisions c/d/i  Lab Results:   Basename 02/03/12 0820 02/02/12 1339  WBC 25.5* 30.8*  HGB 11.9* 12.1*  HCT 36.0* 37.5*  PLT 406* 374   BMET  Basename 02/03/12 0820 02/02/12 1339  NA 136 132*  K 3.9 4.0  CL 100 95*  CO2 28 28  GLUCOSE 119* 133*  BUN 12 10  CREATININE 0.76 0.84  CALCIUM 8.1* 8.5   PT/INR No results found for this basename: LABPROT:2,INR:2 in the last 72 hours CMP     Component Value Date/Time   NA 136 02/03/2012 0820   K 3.9 02/03/2012 0820   CL 100 02/03/2012 0820   CO2 28 02/03/2012 0820   GLUCOSE 119* 02/03/2012 0820   BUN 12 02/03/2012 0820   CREATININE 0.76 02/03/2012 0820   CALCIUM 8.1* 02/03/2012 0820   PROT 6.8 02/02/2012 1339   ALBUMIN 2.1* 02/02/2012 1339   AST 32 02/02/2012 1339   ALT 52 02/02/2012 1339   ALKPHOS 105 02/02/2012 1339   BILITOT 0.5 02/02/2012 1339   GFRNONAA >90 02/03/2012 0820   GFRAA >90 02/03/2012 0820   Lipase  No results found for this basename: lipase     Studies/Results: Dg Chest Port 1 View  02/02/2012  *RADIOLOGY REPORT*  Clinical Data: Hypoxia,  diaphoresis, difficulty breathing, abdominal distention, right side pain  PORTABLE CHEST - 1 VIEW  Comparison: Portable exam 1210 hours compared to 01/30/2012  Findings: Enlargement of cardiac silhouette. Mediastinal contours and pulmonary vascularity normal. Low lung volumes with bibasilar atelectasis. Upper lungs clear. No pleural effusion or pneumothorax. Mild gaseous distention of the stomach.  IMPRESSION: Low lung volumes with bibasilar atelectasis.   Original Report Authenticated By: Lollie Marrow, M.D.    Dg Abd Portable 1v  02/02/2012  *RADIOLOGY REPORT*  Clinical Data: Abdominal distention, right side pain, difficulty breathing, recent cholecystectomy 01/30/2012  PORTABLE ABDOMEN - 1 VIEW  Comparison: None  Findings: Mild gaseous distention of stomach. Air filled distended loops of small bowel in the abdomen. However there is some gas within the colon as well as the gas and stool within the rectum. Surgical drain noted right upper quadrant. Findings suggest a postoperative ileus. No bowel wall thickening. Surgical clips right upper quadrant consistent with cholecystectomy. No acute osseous findings.  IMPRESSION: Question postoperative ileus.   Original Report Authenticated By: Lollie Marrow, M.D.     Anti-infectives: Anti-infectives     Start     Dose/Rate Route Frequency Ordered Stop   01/30/12 2300   piperacillin-tazobactam (ZOSYN) IVPB 3.375 g  3.375 g 12.5 mL/hr over 240 Minutes Intravenous Every 8 hours 01/30/12 2210     01/30/12 1845   Ampicillin-Sulbactam (UNASYN) 3 g in sodium chloride 0.9 % 100 mL IVPB        3 g 100 mL/hr over 60 Minutes Intravenous  Once 01/30/12 1835 01/30/12 1947           Assessment/Plan  1. POD#3- lap chole for acute cholecystitis: post op septic response. Fever curve better.   --Cont NPO.  NG attempted but pt refused placement  --Continue IV abx (zosyn)  --wbc getting better  --PT for ambulation     LOS: 4 days    Ryker Sudbury  C. 02/03/2012, 11:18 AM Pager: [phone]

## 2012-02-04 LAB — CBC
HCT: 33.9 % — ABNORMAL LOW (ref 39.0–52.0)
Hemoglobin: 10.9 g/dL — ABNORMAL LOW (ref 13.0–17.0)
MCH: 29.2 pg (ref 26.0–34.0)
MCHC: 32.2 g/dL (ref 30.0–36.0)
MCV: 90.9 fL (ref 78.0–100.0)
Platelets: 376 10*3/uL (ref 150–400)
RBC: 3.73 MIL/uL — ABNORMAL LOW (ref 4.22–5.81)
RDW: 14.9 % (ref 11.5–15.5)
WBC: 18.4 10*3/uL — ABNORMAL HIGH (ref 4.0–10.5)

## 2012-02-04 LAB — BASIC METABOLIC PANEL
BUN: 12 mg/dL (ref 6–23)
CO2: 28 mEq/L (ref 19–32)
Calcium: 7.9 mg/dL — ABNORMAL LOW (ref 8.4–10.5)
Chloride: 103 mEq/L (ref 96–112)
Creatinine, Ser: 0.71 mg/dL (ref 0.50–1.35)
GFR calc Af Amer: >90 mL/min (ref >90)
GFR calc non Af Amer: >90 mL/min (ref >90)
Glucose, Bld: 94 mg/dL (ref 70–99)
Potassium: 3.9 mEq/L (ref 3.5–5.1)
Sodium: 138 mEq/L (ref 135–145)

## 2012-02-04 NOTE — Progress Notes (Signed)
Patient ID: Jonathan King, male   DOB: [birth year], 46 y.o.   MRN: [id number] 4 Days Post-Op  Subjective: Pt c/o less pain, no nausea.  Pos flatus no BM's.  Desat when sleeping, switched from 2 to 4L  Objective: Vital signs in last 24 hours: Temp:  [98.6 F (37 C)-99.3 F (37.4 C)] 98.9 F (37.2 C) (10/09 0402) Pulse Rate:  [71-117] 71  (10/09 0325) Resp:  [13-23] 15  (10/09 0325) BP: (118-131)/(64-92) 129/72 mmHg (10/09 0325) SpO2:  [87 %-95 %] 87 % (10/09 0420) Last BM Date: 01/30/12  Intake/Output from previous day: 10/08 0701 - 10/09 0700 In: 2725 [I.V.:2625; IV Piggyback:100] Out: 875 [Urine:850; Drains:25] Intake/Output this shift:    PE: Gen: NAD CV: tachycardic, getting better Abd: less distended, min tenderness, drain with serosang and fibrinous output.  incisions c/d/i  Lab Results:   Basename 02/04/12 0505 02/03/12 0820  WBC 18.4* 25.5*  HGB 10.9* 11.9*  HCT 33.9* 36.0*  PLT 376 406*   BMET  Basename 02/04/12 0505 02/03/12 0820  NA 138 136  K 3.9 3.9  CL 103 100  CO2 28 28  GLUCOSE 94 119*  BUN 12 12  CREATININE 0.71 0.76  CALCIUM 7.9* 8.1*   PT/INR No results found for this basename: LABPROT:2,INR:2 in the last 72 hours CMP     Component Value Date/Time   NA 138 02/04/2012 0505   K 3.9 02/04/2012 0505   CL 103 02/04/2012 0505   CO2 28 02/04/2012 0505   GLUCOSE 94 02/04/2012 0505   BUN 12 02/04/2012 0505   CREATININE 0.71 02/04/2012 0505   CALCIUM 7.9* 02/04/2012 0505   PROT 6.8 02/02/2012 1339   ALBUMIN 2.1* 02/02/2012 1339   AST 32 02/02/2012 1339   ALT 52 02/02/2012 1339   ALKPHOS 105 02/02/2012 1339   BILITOT 0.5 02/02/2012 1339   GFRNONAA >90 02/04/2012 0505   GFRAA >90 02/04/2012 0505   Lipase  No results found for this basename: lipase     Studies/Results: Dg Chest Port 1 View  02/02/2012  *RADIOLOGY REPORT*  Clinical Data: Hypoxia, diaphoresis, difficulty breathing, abdominal distention, right side pain  PORTABLE CHEST - 1 VIEW   Comparison: Portable exam 1210 hours compared to 01/30/2012  Findings: Enlargement of cardiac silhouette. Mediastinal contours and pulmonary vascularity normal. Low lung volumes with bibasilar atelectasis. Upper lungs clear. No pleural effusion or pneumothorax. Mild gaseous distention of the stomach.  IMPRESSION: Low lung volumes with bibasilar atelectasis.   Original Report Authenticated By: Lollie Marrow, M.D.    Dg Abd Portable 1v  02/02/2012  *RADIOLOGY REPORT*  Clinical Data: Abdominal distention, right side pain, difficulty breathing, recent cholecystectomy 01/30/2012  PORTABLE ABDOMEN - 1 VIEW  Comparison: None  Findings: Mild gaseous distention of stomach. Air filled distended loops of small bowel in the abdomen. However there is some gas within the colon as well as the gas and stool within the rectum. Surgical drain noted right upper quadrant. Findings suggest a postoperative ileus. No bowel wall thickening. Surgical clips right upper quadrant consistent with cholecystectomy. No acute osseous findings.  IMPRESSION: Question postoperative ileus.   Original Report Authenticated By: Lollie Marrow, M.D.     Anti-infectives: Anti-infectives     Start     Dose/Rate Route Frequency Ordered Stop   01/30/12 2300   piperacillin-tazobactam (ZOSYN) IVPB 3.375 g        3.375 g 12.5 mL/hr over 240 Minutes Intravenous Every 8 hours 01/30/12 2210  01/30/12 1845   Ampicillin-Sulbactam (UNASYN) 3 g in sodium chloride 0.9 % 100 mL IVPB        3 g 100 mL/hr over 60 Minutes Intravenous  Once 01/30/12 1835 01/30/12 1947           Assessment/Plan  1. POD#4- lap chole for acute cholecystitis: post op septic response. Fever curve better.    --will start clears  --Continue IV abx (zosyn)  --wbc getting better  --Cont ambulation     LOS: 5 days    Romie Levee C. 02/04/2012, 8:01 AM Pager: [phone]

## 2012-02-05 LAB — CBC
HCT: 32.8 % — ABNORMAL LOW (ref 39.0–52.0)
Hemoglobin: 10.6 g/dL — ABNORMAL LOW (ref 13.0–17.0)
MCH: 29.2 pg (ref 26.0–34.0)
MCHC: 32.3 g/dL (ref 30.0–36.0)
MCV: 90.4 fL (ref 78.0–100.0)
Platelets: 394 10*3/uL (ref 150–400)
RBC: 3.63 MIL/uL — ABNORMAL LOW (ref 4.22–5.81)
RDW: 14.6 % (ref 11.5–15.5)
WBC: 15.4 10*3/uL — ABNORMAL HIGH (ref 4.0–10.5)

## 2012-02-05 MED ORDER — DOCUSATE SODIUM 100 MG PO CAPS
100.0000 mg | ORAL_CAPSULE | Freq: Two times a day (BID) | ORAL | Status: DC
  Administered 2012-02-05 – 2012-02-08 (×6): 100 mg via ORAL
  Filled 2012-02-05 (×7): qty 1

## 2012-02-05 MED ORDER — POTASSIUM CHLORIDE IN NACL 20-0.9 MEQ/L-% IV SOLN
INTRAVENOUS | Status: DC
  Filled 2012-02-05 (×2): qty 1000

## 2012-02-05 MED ORDER — BISACODYL 10 MG RE SUPP
10.0000 mg | Freq: Once | RECTAL | Status: AC
  Administered 2012-02-05: 10 mg via RECTAL
  Filled 2012-02-05: qty 1

## 2012-02-05 NOTE — Progress Notes (Signed)
Patient ID: Jonathan King, male   DOB: Jun 22, 1965, 46 y.o.   MRN: [id number] 5 Days Post-Op  Subjective: Pt c/o less pain, no nausea.  Pos flatus no BM's.  Tolerated liquids Objective: Vital signs in last 24 hours: Temp:  [98 F (36.7 C)-100.3 F (37.9 C)] 99.4 F (37.4 C) (10/10 0400) Pulse Rate:  [95-106] 95  (10/10 0400) Resp:  [18-25] 19  (10/10 0400) BP: (123-151)/(77-90) 135/80 mmHg (10/10 0400) SpO2:  [93 %-97 %] 93 % (10/10 0400) Last BM Date: 01/30/12  Intake/Output from previous day: 10/09 0701 - 10/10 0700 In: 1555 [P.O.:600; I.V.:950] Out: 1330 [Urine:1300; Drains:30] Intake/Output this shift:    PE: Gen: NAD CV: tachycardic, getting better Abd: distended, min tenderness, drain with serosang and fibrinous output.  incisions c/d/i  Lab Results:   Basename 02/05/12 0528 02/04/12 0505  WBC 15.4* 18.4*  HGB 10.6* 10.9*  HCT 32.8* 33.9*  PLT 394 376   BMET  Basename 02/04/12 0505 02/03/12 0820  NA 138 136  K 3.9 3.9  CL 103 100  CO2 28 28  GLUCOSE 94 119*  BUN 12 12  CREATININE 0.71 0.76  CALCIUM 7.9* 8.1*   PT/INR No results found for this basename: LABPROT:2,INR:2 in the last 72 hours CMP     Component Value Date/Time   NA 138 02/04/2012 0505   K 3.9 02/04/2012 0505   CL 103 02/04/2012 0505   CO2 28 02/04/2012 0505   GLUCOSE 94 02/04/2012 0505   BUN 12 02/04/2012 0505   CREATININE 0.71 02/04/2012 0505   CALCIUM 7.9* 02/04/2012 0505   PROT 6.8 02/02/2012 1339   ALBUMIN 2.1* 02/02/2012 1339   AST 32 02/02/2012 1339   ALT 52 02/02/2012 1339   ALKPHOS 105 02/02/2012 1339   BILITOT 0.5 02/02/2012 1339   GFRNONAA >90 02/04/2012 0505   GFRAA >90 02/04/2012 0505   Lipase  No results found for this basename: lipase     Studies/Results: No results found.  Anti-infectives: Anti-infectives     Start     Dose/Rate Route Frequency Ordered Stop   01/30/12 2300   piperacillin-tazobactam (ZOSYN) IVPB 3.375 g        3.375 g 12.5 mL/hr over 240 Minutes  Intravenous Every 8 hours 01/30/12 2210     01/30/12 1845   Ampicillin-Sulbactam (UNASYN) 3 g in sodium chloride 0.9 % 100 mL IVPB        3 g 100 mL/hr over 60 Minutes Intravenous  Once 01/30/12 1835 01/30/12 1947           Assessment/Plan  1. POD#5- lap chole for acute cholecystitis: post op septic response. Still with low grade fevers and distended abd.    --tolerating liquids  --Continue IV abx (zosyn)  --wbc getting better  --Cont ambulation  --Will give suppository to assist with bowel movement  --Transfer to floor     LOS: 6 days    Vanita Panda. 02/05/2012, 8:26 AM Pager: [phone]

## 2012-02-05 NOTE — Progress Notes (Signed)
Pt transferred to [address]. Report given to [facility] nurse on [address]. No complaints.

## 2012-02-05 NOTE — Progress Notes (Signed)
Received patient from 3300, alert and oriented, crying, anxious about the transfer. Reassure patient that we will take good care of him. VSS, will continue to monitor.

## 2012-02-06 LAB — CBC
HCT: 31.9 % — ABNORMAL LOW (ref 39.0–52.0)
Hemoglobin: 10.4 g/dL — ABNORMAL LOW (ref 13.0–17.0)
MCH: 29.4 pg (ref 26.0–34.0)
MCHC: 32.6 g/dL (ref 30.0–36.0)
MCV: 90.1 fL (ref 78.0–100.0)
Platelets: 439 10*3/uL — ABNORMAL HIGH (ref 150–400)
RBC: 3.54 MIL/uL — ABNORMAL LOW (ref 4.22–5.81)
RDW: 14.2 % (ref 11.5–15.5)
WBC: 14.8 10*3/uL — ABNORMAL HIGH (ref 4.0–10.5)

## 2012-02-06 NOTE — Progress Notes (Signed)
Patient ID: Jonathan King, male   DOB: [birth year], 46 y.o.   MRN: [id number] 6 Days Post-Op  Subjective: Pt c/o less pain, no nausea.  Pos flatus and BM's.  Tolerated liquids Objective: Vital signs in last 24 hours: Temp:  [97.9 F (36.6 C)-98.9 F (37.2 C)] 98.1 F (36.7 C) (10/11 0501) Pulse Rate:  [83-112] 97  (10/11 0501) Resp:  [18-22] 18  (10/11 0501) BP: (117-134)/(69-95) 124/82 mmHg (10/11 0501) SpO2:  [90 %-95 %] 95 % (10/11 0501) Last BM Date: 02/05/12  Intake/Output from previous day: 10/10 0701 - 10/11 0700 In: 1110 [P.O.:800; I.V.:310] Out: 100 [Urine:100] Intake/Output this shift:    PE: Gen: NAD CV: tachycardic, getting better Abd: distended, min tenderness, drain with serosang and fibrinous output.  incisions c/d/i  Lab Results:   Basename 02/06/12 0605 02/05/12 0528  WBC 14.8* 15.4*  HGB 10.4* 10.6*  HCT 31.9* 32.8*  PLT 439* 394   BMET  Basename 02/04/12 0505  NA 138  K 3.9  CL 103  CO2 28  GLUCOSE 94  BUN 12  CREATININE 0.71  CALCIUM 7.9*   PT/INR No results found for this basename: LABPROT:2,INR:2 in the last 72 hours CMP     Component Value Date/Time   NA 138 02/04/2012 0505   K 3.9 02/04/2012 0505   CL 103 02/04/2012 0505   CO2 28 02/04/2012 0505   GLUCOSE 94 02/04/2012 0505   BUN 12 02/04/2012 0505   CREATININE 0.71 02/04/2012 0505   CALCIUM 7.9* 02/04/2012 0505   PROT 6.8 02/02/2012 1339   ALBUMIN 2.1* 02/02/2012 1339   AST 32 02/02/2012 1339   ALT 52 02/02/2012 1339   ALKPHOS 105 02/02/2012 1339   BILITOT 0.5 02/02/2012 1339   GFRNONAA >90 02/04/2012 0505   GFRAA >90 02/04/2012 0505   Lipase  No results found for this basename: lipase     Studies/Results: No results found.  Anti-infectives: Anti-infectives     Start     Dose/Rate Route Frequency Ordered Stop   01/30/12 2300   piperacillin-tazobactam (ZOSYN) IVPB 3.375 g        3.375 g 12.5 mL/hr over 240 Minutes Intravenous Every 8 hours 01/30/12 2210     01/30/12 1845    Ampicillin-Sulbactam (UNASYN) 3 g in sodium chloride 0.9 % 100 mL IVPB        3 g 100 mL/hr over 60 Minutes Intravenous  Once 01/30/12 1835 01/30/12 1947           Assessment/Plan  1. POD#6- lap chole for acute cholecystitis: post op septic response. Still with distended abd.    --tolerating liquids, having BM's.  Doesn't want to advance diet today  --Continue IV abx (zosyn)   --wbc getting better  --Cont ambulation      LOS: 7 days    Romie Levee C. 02/06/2012, 9:44 AM Pager: [phone]

## 2012-02-07 DIAGNOSIS — K560 Paralytic ileus: Secondary | ICD-10-CM

## 2012-02-07 LAB — CBC
HCT: 32.2 % — ABNORMAL LOW (ref 39.0–52.0)
Hemoglobin: 10.4 g/dL — ABNORMAL LOW (ref 13.0–17.0)
MCH: 28.8 pg (ref 26.0–34.0)
MCHC: 32.3 g/dL (ref 30.0–36.0)
MCV: 89.2 fL (ref 78.0–100.0)
Platelets: 458 10*3/uL — ABNORMAL HIGH (ref 150–400)
RBC: 3.61 MIL/uL — ABNORMAL LOW (ref 4.22–5.81)
RDW: 14.5 % (ref 11.5–15.5)
WBC: 12.3 10*3/uL — ABNORMAL HIGH (ref 4.0–10.5)

## 2012-02-07 MED ORDER — PANTOPRAZOLE SODIUM 40 MG PO TBEC
40.0000 mg | DELAYED_RELEASE_TABLET | Freq: Every day | ORAL | Status: DC
  Administered 2012-02-07 – 2012-02-08 (×2): 40 mg via ORAL
  Filled 2012-02-07 (×2): qty 1

## 2012-02-07 MED ORDER — AMOXICILLIN-POT CLAVULANATE 875-125 MG PO TABS
1.0000 | ORAL_TABLET | Freq: Two times a day (BID) | ORAL | Status: DC
  Administered 2012-02-07 – 2012-02-08 (×3): 1 via ORAL
  Filled 2012-02-07 (×6): qty 1

## 2012-02-07 NOTE — Progress Notes (Signed)
7 Days Post-Op  Subjective: Patient tolerated clears yesterday One bowel movement this morning Still mildly distended, but improved from yesterday.  Objective: Vital signs in last 24 hours: Temp:  [98.1 F (36.7 C)-99 F (37.2 C)] 98.1 F (36.7 C) (10/12 0523) Pulse Rate:  [77-95] 95  (10/12 0523) Resp:  [18-20] 18  (10/12 0523) BP: (105-127)/(73-88) 105/73 mmHg (10/12 0523) SpO2:  [91 %] 91 % (10/12 0523) Last BM Date: 02/06/12  Intake/Output from previous day: 10/11 0701 - 10/12 0700 In: 1113 [P.O.:840; I.V.:223; IV Piggyback:50] Out: 15 [Drains:15] Intake/Output this shift:    General appearance: alert, cooperative and no distress GI: mildly distended; + bowel sounds; incisional tenderness Some skin sensitivity from steri-strips - removed. Drain - old bloody drainage; no bilious drainage  Lab Results:   Basename 02/07/12 0505 02/06/12 0605  WBC 12.3* 14.8*  HGB 10.4* 10.4*  HCT 32.2* 31.9*  PLT 458* 439*   BMET No results found for this basename: NA:2,K:2,CL:2,CO2:2,GLUCOSE:2,BUN:2,CREATININE:2,CALCIUM:2 in the last 72 hours PT/INR No results found for this basename: LABPROT:2,INR:2 in the last 72 hours ABG No results found for this basename: PHART:2,PCO2:2,PO2:2,HCO3:2 in the last 72 hours  Studies/Results: No results found.  Anti-infectives: Anti-infectives     Start     Dose/Rate Route Frequency Ordered Stop   02/07/12 1000   amoxicillin-clavulanate (AUGMENTIN) 875-125 MG per tablet 1 tablet        1 tablet Oral Every 12 hours 02/07/12 0810     01/30/12 2300   piperacillin-tazobactam (ZOSYN) IVPB 3.375 g  Status:  Discontinued        3.375 g 12.5 mL/hr over 240 Minutes Intravenous Every 8 hours 01/30/12 2210 02/07/12 0810   01/30/12 1845   Ampicillin-Sulbactam (UNASYN) 3 g in sodium chloride 0.9 % 100 mL IVPB        3 g 100 mL/hr over 60 Minutes Intravenous  Once 01/30/12 1835 01/30/12 1947          Assessment/Plan: s/p Procedure(s) (LRB) with  comments: LAPAROSCOPIC CHOLECYSTECTOMY (N/A) Gangrenous cholecystitis Post-operative ileus Septic response  Advance diet Saline lock Change IV antibiotics to PO - Augmentin Remove drain in AM Probable discharge tomorrow. Encourage ambulation   LOS: 8 days    Anyra Kaufman K. 02/07/2012

## 2012-02-08 MED ORDER — OXYCODONE-ACETAMINOPHEN 5-325 MG PO TABS: 1.0000 | ORAL_TABLET | ORAL | Status: DC | PRN

## 2012-02-08 NOTE — Discharge Summary (Signed)
Physician Discharge Summary  Patient ID: Jonathan King MRN: [id number] DOB/AGE: [id number] y.o.  Admit date: 01/30/2012 Discharge date: 02/08/2012  Admission Diagnoses:Acute cholecystitis  Discharge Diagnoses: Acute cholecystitis Post-operative ileus - resolved Active Problems:  Acute cholecystitis   Discharged Condition: good  Hospital Course: Lap chole with IOC for acute gangrenous cholecystitis on 01/31/12.  Prolonged post-operative ileus - slowly resolved.  Drain removed on day of discharge.  Tolerating regular diet  Consults: None  Significant Diagnostic Studies: none  Treatments: surgery: Laparoscopic cholecystectomy with intraoperative cholangiogram  Discharge Exam: Blood pressure 124/81, pulse 95, temperature 98.4 F (36.9 C), temperature source Oral, resp. rate 19, height 6' (1.829 m), weight 220 lb 7.4 oz (100 kg), SpO2 91.00%. General appearance: alert, cooperative and no distress Resp: clear to auscultation bilaterally GI: soft, non-tender; bowel sounds normal; no masses,  no organomegaly Some skin irritation from dressings RUQ drain site - minimal bloody drainage; dressing in place  Disposition: home   Discharge Orders    Future Orders Please Complete By Expires   Diet general      Increase activity slowly      May walk up steps      May shower / Bathe      Driving Restrictions      Comments:   Do not drive while taking pain medications   Call MD for:  temperature >100.4      Call MD for:  persistant nausea and vomiting      Call MD for:  severe uncontrolled pain      Call MD for:  redness, tenderness, or signs of infection (pain, swelling, redness, odor or green/yellow discharge around incision site)      Discharge instructions      Comments:   [facility] LAPAROSCOPIC SURGERY: POST OP INSTRUCTIONS Always review your discharge instruction sheet given to you by the facility where your surgery was performed. IF YOU HAVE  DISABILITY OR FAMILY LEAVE FORMS, YOU MUST BRING THEM TO THE OFFICE FOR PROCESSING.   DO NOT GIVE THEM TO YOUR DOCTOR.  A prescription for pain medication may be given to you upon discharge.  Take your pain medication as prescribed, if needed.  If narcotic pain medicine is not needed, then you may take acetaminophen (Tylenol) or ibuprofen (Advil) as needed. Take your usually prescribed medications unless otherwise directed. If you need a refill on your pain medication, please contact your pharmacy.  They will contact our office to request authorization. Prescriptions will not be filled after 5pm or on week-ends. You should follow a light diet the first few days after arrival home, such as soup and crackers, etc.  Be sure to include lots of fluids daily. Most patients will experience some swelling and bruising in the area of the incisions.  Ice packs will help.  Swelling and bruising can take several days to resolve.  It is common to experience some constipation if taking pain medication after surgery.  Increasing fluid intake and taking a stool softener (such as Colace) will usually help or prevent this problem from occurring.  A mild laxative (Milk of Magnesia or Miralax) should be taken according to package instructions if there are no bowel movements after 48 hours. Unless discharge instructions indicate otherwise, you may remove your bandages 24-48 hours after surgery, and you may shower at that time.  You may have steri-strips (small skin tapes) in place directly over the incision.  These strips should be left on the skin for  7-10 days.  If your surgeon used skin glue on the incision, you may shower in 24 hours.  The glue will flake off over the next 2-3 weeks.  Any sutures or staples will be removed at the office during your follow-up visit. ACTIVITIES:  You may resume regular (light) daily activities beginning the next day-such as daily self-care, walking, climbing stairs-gradually increasing  activities as tolerated.  You may have sexual intercourse when it is comfortable.  Refrain from any heavy lifting or straining until approved by your doctor. You may drive when you are no longer taking prescription pain medication, you can comfortably wear a seatbelt, and you can safely maneuver your car and apply brakes. RETURN TO WORK:  __________________________________________________________ Bonita Quin should see your doctor in the office for a follow-up appointment approximately 2-3 weeks after your surgery.  Make sure that you call for this appointment within a day or two after you arrive home to insure a convenient appointment time. OTHER INSTRUCTIONS: __________________________________________________________________________________________________________________________ __________________________________________________________________________________________________________________________ WHEN TO CALL YOUR DOCTOR: Fever over 101.0 Inability to urinate Continued bleeding from incision. Increased pain, redness, or drainage from the incision. Increasing abdominal pain  The clinic staff is available to answer your questions during regular business hours.  Please don't hesitate to call and ask to speak to one of the nurses for clinical concerns.  If you have a medical emergency, go to the nearest emergency room or call 911.  A surgeon from [facility] is always on call at the hospital. [address], [address], [place], [place]  [address] [address] [address], [place], [place]   [address] [phone] ? [phone]  FAX [phone] Web site: [email or web address]   Discharge wound care:      Comments:   Dry dressing over the old drain site on your right side.  Change this daily after your shower.  Use gauze and tape to cover the hole.       Medication List     As of 02/08/2012  8:49 AM    TAKE these medications         ibuprofen 200 MG tablet   Commonly known as:  ADVIL,MOTRIN   Take 600 mg by mouth every 6 (six) hours as needed. For pain      oxyCODONE-acetaminophen 5-325 MG per tablet   Commonly known as: PERCOCET/ROXICET   Take 1-2 tablets by mouth every 4 (four) hours as needed.           Follow-up Information    Follow up with [facility]. Schedule an appointment as soon as possible for a visit in 2 weeks.   Contact information:   [address] [place], [place] [address] [phone]         Signed: Wynona Luna. 02/08/2012, 8:49 AM

## 2012-02-12 ENCOUNTER — Telehealth (INDEPENDENT_AMBULATORY_CARE_PROVIDER_SITE_OTHER): Payer: Self-pay | Admitting: General Surgery

## 2012-02-12 NOTE — Telephone Encounter (Signed)
Called patient to set up p.o.gallbladder appt to see the [facility] per Marisue Ivan in 7-10 days per her message...patient has appt on 02/17/12 at 4:00 and is aware and fine with instructions.

## 2012-02-17 ENCOUNTER — Ambulatory Visit (INDEPENDENT_AMBULATORY_CARE_PROVIDER_SITE_OTHER): Payer: Self-pay | Admitting: General Surgery

## 2012-02-17 ENCOUNTER — Encounter (INDEPENDENT_AMBULATORY_CARE_PROVIDER_SITE_OTHER): Payer: Self-pay

## 2012-02-17 VITALS — BP 128/78 | HR 94 | Temp 97.9°F | Ht 72.0 in | Wt 187.0 lb

## 2012-02-17 DIAGNOSIS — K8010 Calculus of gallbladder with chronic cholecystitis without obstruction: Secondary | ICD-10-CM

## 2012-02-17 NOTE — Progress Notes (Signed)
FON RIJO Harland 1965/10/31 [id number] 02/17/2012   Jonathan King is a 46 y.o. male who had a laparoscopic cholecystectomy with intraoperative cholangiogram by Dr.Wilson.  The pathology report confirmed cholecystitis.  The patient reports that they are feeling well with normal bowel movements and good appetite.  The pre-operative symptoms of abdominal pain, nausea, and vomiting have resolved.  Gangrenous Cholecystitis on exam in OR. She is back to work and doing well Physical examination - Incisions appear well-healed with no sign of infection or bleeding.   Abdomen - soft, non-tender . Impression:  s/p laparoscopic cholecystectomy  Plan:  She may resume a regular diet and full activity.  She may follow-up on a PRN basis.

## 2012-02-17 NOTE — Patient Instructions (Signed)
Call if you have a problem. 

## 2012-02-20 NOTE — ED Provider Notes (Signed)
Medical screening examination/treatment/procedure(s) were performed by non-physician practitioner and as supervising physician I was immediately available for consultation/collaboration.  Leslee Home, M.D.   Reuben Likes, MD 02/20/12 [phone]

## 2013-05-17 ENCOUNTER — Encounter (HOSPITAL_COMMUNITY): Payer: Self-pay | Admitting: Emergency Medicine

## 2013-05-17 ENCOUNTER — Emergency Department (INDEPENDENT_AMBULATORY_CARE_PROVIDER_SITE_OTHER)
Admission: EM | Admit: 2013-05-17 | Discharge: 2013-05-17 | Disposition: A | Discharge status: Home / Self Care | Payer: Self-pay | Source: Home / Self Care | Attending: Family Medicine | Admitting: Family Medicine

## 2013-05-17 DIAGNOSIS — L0390 Cellulitis, unspecified: Secondary | ICD-10-CM

## 2013-05-17 DIAGNOSIS — L0291 Cutaneous abscess, unspecified: Secondary | ICD-10-CM

## 2013-05-17 MED ORDER — SULFAMETHOXAZOLE-TRIMETHOPRIM 800-160 MG PO TABS: 2.0000 | ORAL_TABLET | Freq: Two times a day (BID) | ORAL | Status: DC

## 2013-05-17 MED ORDER — HYDROCODONE-ACETAMINOPHEN 5-325 MG PO TABS: 1.0000 | ORAL_TABLET | Freq: Four times a day (QID) | ORAL | Status: DC | PRN

## 2013-05-17 NOTE — Discharge Instructions (Signed)
You have an abscess of the skin which is an infection. You need to take antibiotics for 10 days. If you develop a reaction to the medication, please stop them and let me know. Also, if you start to develop shaking chills or spreading of the infection around your scalp, then come back. Otherwise, rinse the area with warm water 2-3 x per day.   Take Care,   Dr. Maricela Bo

## 2013-05-17 NOTE — ED Provider Notes (Signed)
CSN: [id number]     Arrival date & time 05/17/13  1730 History   First MD Initiated Contact with Patient 05/17/13 1947     No chief complaint on file.  (Consider location/radiation/quality/duration/timing/severity/associated sxs/prior Treatment) HPI  "knot" on head x 7 days. Started as a small bump that looked like it had blood in it. Wife tried to lance it with a needle and there was drainage of blood. After that it started swelling more and having drainage with bleeding. It is painful. He denies fever or chills. Has tried rubbing alcohol, neosporin, and hydrogen peroxide without help.   Past Medical History  Diagnosis Date  . Back pain    Past Surgical History  Procedure Laterality Date  . Cholecystectomy  01/31/2012    Procedure: LAPAROSCOPIC CHOLECYSTECTOMY;  Surgeon: Gayland Curry, MD,FACS;  Location: [facility];  Service: General;  Laterality: N/A;   No family history on file. History  Substance Use Topics  . Smoking status: Never Smoker   . Smokeless tobacco: Not on file  . Alcohol Use: No    Review of Systems See HPI Allergies  Review of patient's allergies indicates no known allergies.  Home Medications   Current Outpatient Rx  Name  Route  Sig  Dispense  Refill  . HYDROcodone-acetaminophen (NORCO) 5-325 MG per tablet   Oral   Take 1 tablet by mouth every 6 (six) hours as needed for moderate pain.   15 tablet   0   . ibuprofen (ADVIL,MOTRIN) 200 MG tablet   Oral   Take 600 mg by mouth every 6 (six) hours as needed. For pain         . oxyCODONE-acetaminophen (PERCOCET/ROXICET) 5-325 MG per tablet   Oral   Take 1-2 tablets by mouth every 4 (four) hours as needed.   40 tablet   0   . sulfamethoxazole-trimethoprim (SEPTRA DS) 800-160 MG per tablet   Oral   Take 2 tablets by mouth 2 (two) times daily. Take for 10 days.   40 tablet   0    BP 150/99  Pulse 93  Temp(Src) 99.3 F (37.4 C) (Oral)  Resp 20  SpO2 97% Physical Exam Gen: well appearing,  dissheveled, non distressed Skin: 1 cm x 1 cm draining abscess of left temporal scalp without surrounding erythema Neck: normal ROM, no lymphadenopathy  ED Course  Procedures (including critical care time) Labs Review Labs Reviewed  WOUND CULTURE   Imaging Review No results found.    MDM   1. Abscess    Concern for MRSA but no cellulitis. Given bactrim DS 2 tabs BID x 10 days. Precautions for follow up given.     Angelica Ran, MD 05/17/13 2015

## 2013-05-17 NOTE — ED Notes (Signed)
Abscess to left side of head

## 2013-05-18 NOTE — ED Provider Notes (Signed)
Medical screening examination/treatment/procedure(s) were performed by resident physician or non-physician practitioner and as supervising physician I was immediately available for consultation/collaboration.   Pauline Good MD.   Billy Fischer, MD 05/18/13 [phone]

## 2013-05-21 LAB — WOUND CULTURE: Special Requests: NORMAL

## 2013-05-22 ENCOUNTER — Telehealth (HOSPITAL_COMMUNITY): Payer: Self-pay | Admitting: Emergency Medicine

## 2013-05-22 NOTE — ED Notes (Addendum)
Wound culture head: Mod. MRSA.  Pt. adequately treated with Septra DS.  I called pt. and left a message to call.  Call 1. 05/22/2013 Left message.  Call 2. 05/24/2013 I called pt. Pt. verified x 2 and given results.  Pt. Told he was adequately treated with Septra DS.  He said it is getting better.  I reviewed the [facility] MRSA instructions with him and he voiced understanding. 05/25/2013

## 2013-05-23 ENCOUNTER — Encounter: Payer: Self-pay | Admitting: Family Medicine

## 2013-05-23 DIAGNOSIS — A4902 Methicillin resistant Staphylococcus aureus infection, unspecified site: Secondary | ICD-10-CM | POA: Insufficient documentation

## 2013-05-30 NOTE — ED Notes (Signed)
Accessed to write note for patient

## 2013-05-31 ENCOUNTER — Encounter (HOSPITAL_COMMUNITY): Payer: Self-pay | Admitting: Emergency Medicine

## 2013-05-31 ENCOUNTER — Emergency Department (HOSPITAL_COMMUNITY)
Admission: EM | Admit: 2013-05-31 | Discharge: 2013-05-31 | Disposition: A | Discharge status: Home / Self Care | Payer: Self-pay | Attending: Emergency Medicine | Admitting: Emergency Medicine

## 2013-05-31 DIAGNOSIS — Z8614 Personal history of Methicillin resistant Staphylococcus aureus infection: Secondary | ICD-10-CM | POA: Insufficient documentation

## 2013-05-31 DIAGNOSIS — Z4800 Encounter for change or removal of nonsurgical wound dressing: Secondary | ICD-10-CM | POA: Insufficient documentation

## 2013-05-31 DIAGNOSIS — Z5189 Encounter for other specified aftercare: Secondary | ICD-10-CM

## 2013-05-31 NOTE — Discharge Instructions (Signed)
Please follow up with your primary care physician in 1-2 days. If you do not have one please call the [facility] number listed above. Please read all discharge instructions and return precautions.   Scar Minimization You will have a scar anytime you have surgery and a cut is made in the skin or you have something removed from your skin (mole, skin cancer, cyst). Although scars are unavoidable following surgery, there are ways to minimize their appearance. It is important to follow all the instructions you receive from your caregiver about wound care. How your wound heals will influence the appearance of your scar. If you do not follow the wound care instructions as directed, complications such as infection may occur. Wound instructions include keeping the wound clean, moist, and not letting the wound form a scab. Some people form scars that are raised and lumpy (hypertrophic) or larger than the initial wound (keloidal). HOME CARE INSTRUCTIONS   Follow wound care instructions as directed.  Keep the wound clean by washing it with soap and water.  Keep the wound moist with provided antibiotic cream or petroleum jelly until completely healed. Moisten twice a day for about 2 weeks.  Get stitches (sutures) taken out at the scheduled time.  Avoid touching or manipulating your wound unless needed. Wash your hands thoroughly before and after touching your wound.  Follow all restrictions such as limits on exercise or work. This depends on where your scar is located.  Keep the scar protected from sunburn. Cover the scar with sunscreen/sunblock with SPF 30 or higher.  Gently massage the scar using a circular motion to help minimize the appearance of the scar. Do this only after the wound has closed and all the sutures have been removed.  For hypertrophic or keloidal scars, there are several ways to treat and minimize their appearance. Methods include compression therapy, intralesional  corticosteroids, laser therapy, or surgery. These methods are performed by your caregiver. Remember that the scar may appear lighter or darker than your normal skin color. This difference in color should even out with time. SEEK MEDICAL CARE IF:   You have a fever.  You develop signs of infection such as pain, redness, pus, and warmth.  You have questions or concerns. Document Released: 10/02/2009 Document Revised: 07/07/2011 Document Reviewed: 10/02/2009 [facility] Patient Information 2014 [place].

## 2013-05-31 NOTE — ED Provider Notes (Signed)
Medical screening examination/treatment/procedure(s) were performed by non-physician practitioner and as supervising physician I was immediately available for consultation/collaboration.  EKG Interpretation   None         Charles B. Karle Starch, MD 05/31/13 [phone]

## 2013-05-31 NOTE — ED Provider Notes (Signed)
CSN: [id number]     Arrival date & time 05/31/13  1234 History   None   This chart was scribed for Baron Sane PA-C, a non-physician practitioner working with No att. providers found by Denice Bors, ED Scribe. This patient was seen in room TR08C/TR08C and the patient's care was started at 1:22 PM     Chief Complaint  Patient presents with  . Abscess   (Consider location/radiation/quality/duration/timing/severity/associated sxs/prior Treatment) The history is provided by the patient. No language interpreter was used.   HPI Comments: TIMOTHEY DAHLSTROM is a 48 y.o. male who presents to the Emergency Department complaining of abscess recheck on left parietal scalp. Reports he completed course of abx prescribed at urgent care. Denies associated drainage, and fever. Pt is requesting a work note allowing him to return.    Past Medical History  Diagnosis Date  . Back pain    Past Surgical History  Procedure Laterality Date  . Cholecystectomy  01/31/2012    Procedure: LAPAROSCOPIC CHOLECYSTECTOMY;  Surgeon: Gayland Curry, MD,FACS;  Location: [facility];  Service: General;  Laterality: N/A;   History reviewed. No pertinent family history. History  Substance Use Topics  . Smoking status: Never Smoker   . Smokeless tobacco: Not on file  . Alcohol Use: No    Review of Systems  Constitutional: Negative for fever.  Skin: Negative for wound.  Psychiatric/Behavioral: Negative for confusion.    Allergies  Review of patient's allergies indicates no known allergies.  Home Medications   Current Outpatient Rx  Name  Route  Sig  Dispense  Refill  . ibuprofen (ADVIL,MOTRIN) 200 MG tablet   Oral   Take 600 mg by mouth every 6 (six) hours as needed. For pain         . sulfamethoxazole-trimethoprim (SEPTRA DS) 800-160 MG per tablet   Oral   Take 2 tablets by mouth 2 (two) times daily. Take for 10 days.   40 tablet   0    BP 120/78  Pulse 92  Temp(Src) 97.5 F (36.4 C) (Oral)   Resp 18  SpO2 94% Physical Exam  Constitutional: He is oriented to person, place, and time. He appears well-developed and well-nourished. No distress.  HENT:  Head: Normocephalic and atraumatic.  Right Ear: External ear normal.  Left Ear: External ear normal.  Nose: Nose normal.  Mouth/Throat: Oropharynx is clear and moist.  Eyes: Conjunctivae are normal.  Neck: Normal range of motion. Neck supple.  Cardiovascular: Normal rate.   Pulmonary/Chest: Effort normal.  Abdominal: Soft.  Musculoskeletal: Normal range of motion.  Neurological: He is alert and oriented to person, place, and time.  Skin: Skin is warm and dry. He is not diaphoretic. No erythema.  0.5 cm well-healed scar on left parietal scalp   Psychiatric: He has a normal mood and affect.    ED Course  Procedures  COORDINATION OF CARE:  Nursing notes reviewed. Vital signs reviewed. Initial pt interview and examination performed.   1:30 PM-Discussed treatment plan with pt at bedside. Pt agrees with plan.   Treatment plan initiated:Medications - No data to display   Initial diagnostic testing ordered.     Labs Review Labs Reviewed - No data to display Imaging Review No results found.  EKG Interpretation   None       MDM   1. Visit for wound check     Filed Vitals:   05/31/13 1337  BP: 120/78  Pulse: 92  Temp: 97.5 F (36.4 C)  Resp: 18   Afebrile, NAD, non-toxic appearing, AAOx4. Well healed incision from previous I&D done at [facility]. No further evidence of abscess. No evidence of cellulitis. Work note provided to patient. Return precautions discussed. Patient is agreeable to plan. Patient is stable at time of discharge     I personally performed the services described in this documentation, which was scribed in my presence. The recorded information has been reviewed and is accurate.    Harlow Mares, PA-C 05/31/13 1418

## 2013-05-31 NOTE — ED Notes (Addendum)
Pt reports to the ED for eval of abscess to the left anterior portion of the skull. Pt reports he was seen at the [facility] and given PO sulfamethoazole and had a swab performed and was then d/c. The patient was called and later told the culture was positive for MRSA. Pt has been compliant with his antibiotics and attempted to go back to work but his work would not accept his work note unless it came from the ED. Area is scabbed over and no longer draining. No erythema, swelling, warmth, or tenderness noted. Pt denies any complaints at this time and reports he just needs a work note. Pt A&Ox4, resp e/u, and skin warm and dry.

## 2013-06-29 ENCOUNTER — Emergency Department (INDEPENDENT_AMBULATORY_CARE_PROVIDER_SITE_OTHER)
Admission: EM | Admit: 2013-06-29 | Discharge: 2013-06-29 | Disposition: A | Discharge status: Home / Self Care | Payer: Self-pay | Source: Home / Self Care | Attending: Family Medicine | Admitting: Family Medicine

## 2013-06-29 ENCOUNTER — Encounter (HOSPITAL_COMMUNITY): Payer: Self-pay | Admitting: Emergency Medicine

## 2013-06-29 DIAGNOSIS — R21 Rash and other nonspecific skin eruption: Secondary | ICD-10-CM

## 2013-06-29 MED ORDER — PREDNISONE 10 MG PO KIT: PACK | ORAL | Status: DC

## 2013-06-29 MED ORDER — METHYLPREDNISOLONE ACETATE 80 MG/ML IJ SUSP
INTRAMUSCULAR | Status: AC
  Filled 2013-06-29: qty 1

## 2013-06-29 MED ORDER — METHYLPREDNISOLONE SODIUM SUCC 125 MG IJ SOLR
80.0000 mg | Freq: Once | INTRAMUSCULAR | Status: AC
  Administered 2013-06-29: 80 mg via INTRAMUSCULAR

## 2013-06-29 MED ORDER — METHYLPREDNISOLONE SODIUM SUCC 125 MG IJ SOLR
INTRAMUSCULAR | Status: AC
  Filled 2013-06-29: qty 2

## 2013-06-29 MED ORDER — TRIAMCINOLONE ACETONIDE 0.1 % EX CREA: 1.0000 "application " | TOPICAL_CREAM | Freq: Two times a day (BID) | CUTANEOUS | Status: DC

## 2013-06-29 NOTE — ED Provider Notes (Signed)
Jonathan King is a 48 y.o. male who presents to Urgent Care today for rash. Patient notes a pruritic maculopapular rash across his trunk extremities and face present for about one week. He adamantly denies any medications recently. His most recent prescription medication was Septra that finished about one month ago. He notes that he's been using a different laundry detergent over the past several months but nothing new. No new changes to soaps or detergents etc. no mouth or eye involvement. No fevers or chills or flulike illness. He has been using calamine lotion which is only temporarily effective. No nausea vomiting or diarrhea fevers or chills.   Past Medical History  Diagnosis Date  . Back pain    History  Substance Use Topics  . Smoking status: Never Smoker   . Smokeless tobacco: Not on file  . Alcohol Use: No   ROS as above Medications: No current facility-administered medications for this encounter.   Current Outpatient Prescriptions  Medication Sig Dispense Refill  . ibuprofen (ADVIL,MOTRIN) 200 MG tablet Take 600 mg by mouth every 6 (six) hours as needed. For pain      . PredniSONE 10 MG KIT 12 day dose pack po  1 kit  0  . triamcinolone cream (KENALOG) 0.1 % Apply 1 application topically 2 (two) times daily.  60 g  1    Exam:  BP 140/89  Pulse 100  Temp(Src) 98.6 F (37 C) (Oral)  Resp 19  SpO2 98% Gen: Well NAD HEENT: EOMI,  MMM no mucocutaneous involvement Lungs: Normal work of breathing. CTABL Heart: RRR no MRG Abd: NABS, Soft. NT, ND Exts: Brisk capillary refill, warm and well perfused.  Skin: Diffuse patchy maculopapular rash with excoriations across trunk extremities and face and neck. Nontender. Blanching.  No results found for this or any previous visit (from the past 24 hour(s)). No results found.  Assessment and Plan: 47 y.o. male with dermatitis. Unclear etiology. Possibly contact. Very doubtfully related to Septra exposure one month ago.  Plan to use  prednisone and triamcinolone cream.  Followup in 2-3 days for recheck. Go to the emergency room if worsening.  Discussed warning signs or symptoms. Please see discharge instructions. Patient expresses understanding.     S , MD 06/29/13 1226 

## 2013-06-29 NOTE — ED Notes (Signed)
Rash for one week, started on right torso and has spread all over body.  Red dotted rash, itches terribly, some scabbed areas from scratching.  Denies any respiratory symptoms

## 2013-06-29 NOTE — Discharge Instructions (Signed)
Thank you for coming in today. Take prednisone daily starting today. Apply the cream twice daily as needed Use moisturizers Use [place] Itch as needed Return in 2 days for recheck after 4 PM.    Contact Dermatitis Contact dermatitis is a reaction to certain substances that touch the skin. Contact dermatitis can be either irritant contact dermatitis or allergic contact dermatitis. Irritant contact dermatitis does not require previous exposure to the substance for a reaction to occur.Allergic contact dermatitis only occurs if you have been exposed to the substance before. Upon a repeat exposure, your body reacts to the substance.  CAUSES  Many substances can cause contact dermatitis. Irritant dermatitis is most commonly caused by repeated exposure to mildly irritating substances, such as:  Makeup.  Soaps.  Detergents.  Bleaches.  Acids.  Metal salts, such as nickel. Allergic contact dermatitis is most commonly caused by exposure to:  Poisonous plants.  Chemicals (deodorants, shampoos).  Jewelry.  Latex.  Neomycin in triple antibiotic cream.  Preservatives in products, including clothing. SYMPTOMS  The area of skin that is exposed may develop:  Dryness or flaking.  Redness.  Cracks.  Itching.  Pain or a burning sensation.  Blisters. With allergic contact dermatitis, there may also be swelling in areas such as the eyelids, mouth, or genitals.  DIAGNOSIS  Your caregiver can usually tell what the problem is by doing a physical exam. In cases where the cause is uncertain and an allergic contact dermatitis is suspected, a patch skin test may be performed to help determine the cause of your dermatitis. TREATMENT Treatment includes protecting the skin from further contact with the irritating substance by avoiding that substance if possible. Barrier creams, powders, and gloves may be helpful. Your caregiver may also recommend:  Steroid creams or ointments applied 2  times daily. For best results, soak the rash area in cool water for 20 minutes. Then apply the medicine. Cover the area with a plastic wrap. You can store the steroid cream in the refrigerator for a "chilly" effect on your rash. That may decrease itching. Oral steroid medicines may be needed in more severe cases.  Antibiotics or antibacterial ointments if a skin infection is present.  Antihistamine lotion or an antihistamine taken by mouth to ease itching.  Lubricants to keep moisture in your skin.  Burow's solution to reduce redness and soreness or to dry a weeping rash. Mix one packet or tablet of solution in 2 cups cool water. Dip a clean washcloth in the mixture, wring it out a bit, and put it on the affected area. Leave the cloth in place for 30 minutes. Do this as often as possible throughout the day.  Taking several cornstarch or baking soda baths daily if the area is too large to cover with a washcloth. Harsh chemicals, such as alkalis or acids, can cause skin damage that is like a burn. You should flush your skin for 15 to 20 minutes with cold water after such an exposure. You should also seek immediate medical care after exposure. Bandages (dressings), antibiotics, and pain medicine may be needed for severely irritated skin.  HOME CARE INSTRUCTIONS  Avoid the substance that caused your reaction.  Keep the area of skin that is affected away from hot water, soap, sunlight, chemicals, acidic substances, or anything else that would irritate your skin.  Do not scratch the rash. Scratching may cause the rash to become infected.  You may take cool baths to help stop the itching.  Only take over-the-counter  or prescription medicines as directed by your caregiver.  See your caregiver for follow-up care as directed to make sure your skin is healing properly. SEEK MEDICAL CARE IF:   Your condition is not better after 3 days of treatment.  You seem to be getting worse.  You see signs of  infection such as swelling, tenderness, redness, soreness, or warmth in the affected area.  You have any problems related to your medicines. Document Released: 04/11/2000 Document Revised: 07/07/2011 Document Reviewed: 09/17/2010 [facility] Patient Information 2014 [place], [place].

## 2013-07-01 ENCOUNTER — Emergency Department (INDEPENDENT_AMBULATORY_CARE_PROVIDER_SITE_OTHER)
Admission: EM | Admit: 2013-07-01 | Discharge: 2013-07-01 | Disposition: A | Discharge status: Home / Self Care | Payer: Self-pay | Source: Home / Self Care | Attending: Emergency Medicine | Admitting: Emergency Medicine

## 2013-07-01 ENCOUNTER — Encounter (HOSPITAL_COMMUNITY): Payer: Self-pay | Admitting: Emergency Medicine

## 2013-07-01 DIAGNOSIS — L309 Dermatitis, unspecified: Secondary | ICD-10-CM

## 2013-07-01 DIAGNOSIS — L259 Unspecified contact dermatitis, unspecified cause: Secondary | ICD-10-CM

## 2013-07-01 MED ORDER — TRIAMCINOLONE ACETONIDE 0.1 % EX CREA: 1.0000 "application " | TOPICAL_CREAM | Freq: Three times a day (TID) | CUTANEOUS | Status: DC

## 2013-07-01 NOTE — ED Provider Notes (Signed)
  Chief Complaint    Chief Complaint  Patient presents with  . Rash    History of Present Illness      Jonathan King is a 48 year old male who's had a one-week history of a rash on the right side of his body. This began in the flank area on the right and spread as far as the axilla and down as far as the hip. It had begun to spread around the sides of the body. He also has some on his neck and his left antecubital fossa. He was seen here 2 days ago. It was thought that this was a contact dermatitis, although he was unable to recall anything that he come in contact with. He was begun on prednisone and triamcinolone cream. The rash seems to be getting better over the past 2 days. It is mildly pruritic. He denies any difficulty breathing or swelling of the lips, tongue, or throat.  Review of Systems   Other than as noted above, the patient denies any of the following symptoms: Systemic:  No fever, chills, or myalgias. ENT:  No nasal congestion, rhinorrhea, sore throat, swelling of lips, tongue or throat. Resp:  No cough, wheezing, or shortness of breath.  [place]    Past medical history, family history, social history, meds, and allergies were reviewed.   Physical Exam     Vital signs:  BP 127/78  Pulse 84  Temp(Src) 98.3 F (36.8 C) (Oral)  Resp 20  SpO2 95% Gen:  Alert, oriented, in no distress. ENT:  Pharynx clear, no intraoral lesions, moist mucous membranes. Lungs:  Clear to auscultation. Skin:  There is an erythematous, scaly, well-demarcated rash that encompasses the entire right lateral side of the torso from the axilla on down to the hip. This extends somewhat onto the back and onto the anterior chest and abdomen. He also has a small patch of the same rash in the left antecubital fossa and on anterior neck. His face is flushed and erythematous but there is no rash on his face.  Assessment    The encounter diagnosis was Dermatitis.  Appears to be a contact dermatitis,  although I'm not sure what it is that caused it. He seems to be getting better with current medications, so will continue on with these and suggested that if he's not completely well in a week to return for a recheck.  Plan     1.  Meds:  The following meds were prescribed:   Discharge Medication List as of 07/01/2013  7:43 PM    START taking these medications   Details  !! triamcinolone cream (KENALOG) 0.1 % Apply 1 application topically 3 (three) times daily., Starting 07/01/2013, Until Discontinued, Normal     !! - Potential duplicate medications found. Please discuss with provider.      2.  Patient Education/Counseling:  The patient was given appropriate handouts, self care instructions, and instructed in symptomatic relief.    3.  Follow up:  The patient was told to follow up here if no better in 3 to 4 days, or sooner if becoming worse in any way, and given some red flag symptoms such as worsening rash, fever, or difficulty breathing which would prompt immediate return.  Follow up here if necessary.      Harden Mo, MD 07/01/13 2206

## 2013-07-01 NOTE — Discharge Instructions (Signed)

## 2013-07-01 NOTE — ED Notes (Addendum)
Here for recheck of rash from 3/4.  He got a shot of Solu-Medrol 125 mg.  He is taking prednisone dose pack  and Triamcinolone cream with some relief.  Itching has decreased.  Rash is peeling on his arms and sides of abdomen.  Face and neck appear red.

## 2013-09-18 ENCOUNTER — Emergency Department (HOSPITAL_COMMUNITY)
Admission: EM | Admit: 2013-09-18 | Discharge: 2013-09-18 | Disposition: A | Discharge status: Home / Self Care | Payer: Self-pay | Attending: Emergency Medicine | Admitting: Emergency Medicine

## 2013-09-18 ENCOUNTER — Encounter (HOSPITAL_COMMUNITY): Payer: Self-pay | Admitting: Emergency Medicine

## 2013-09-18 DIAGNOSIS — L03811 Cellulitis of head [any part, except face]: Secondary | ICD-10-CM

## 2013-09-18 DIAGNOSIS — L03818 Cellulitis of other sites: Principal | ICD-10-CM

## 2013-09-18 DIAGNOSIS — L02818 Cutaneous abscess of other sites: Secondary | ICD-10-CM | POA: Insufficient documentation

## 2013-09-18 DIAGNOSIS — IMO0002 Reserved for concepts with insufficient information to code with codable children: Secondary | ICD-10-CM | POA: Insufficient documentation

## 2013-09-18 MED ORDER — CEPHALEXIN 500 MG PO CAPS: 500.0000 mg | ORAL_CAPSULE | Freq: Four times a day (QID) | ORAL | Status: DC

## 2013-09-18 MED ORDER — SULFAMETHOXAZOLE-TRIMETHOPRIM 800-160 MG PO TABS: 1.0000 | ORAL_TABLET | Freq: Two times a day (BID) | ORAL | Status: AC

## 2013-09-18 NOTE — Discharge Instructions (Signed)
MRSA Overview  MRSA stands for methicillin-resistant Staphylococcus aureus. It is a type of bacteria that is resistant to some common antibiotics. It can cause infections in the skin and many other places in the body. Staphylococcus aureus, often called "staph," is a bacteria that normally lives on the skin or in the nose. Staph on the surface of the skin or in the nose does not cause problems. However, if the staph enters the body through a cut, wound, or break in the skin, an infection can happen.  Up until recently, infections with the MRSA type of staph mainly occurred in hospitals and other healthcare settings. There are now increasing problems with MRSA infections in the community as well. Infections with MRSA may be very serious or even life-threatening. Most MRSA infections are acquired in one of two ways:  · Healthcare-associated MRSA (HA-MRSA)  · This can be acquired by people in any healthcare setting. MRSA can be a big problem for hospitalized people, people in nursing homes, people in rehabilitation facilities, people with weakened immune systems, dialysis patients, and those who have had surgery.  · Community-associated MRSA (CA-MRSA)  · Community spread of MRSA is becoming more common. It is known to spread in crowded settings, in jails and prisons, and in situations where there is close skin-to-skin contact, such as during sporting events or in locker rooms. MRSA can be spread through shared items, such as children's toys, razors, towels, or sports equipment.  CAUSES   All staph, including MRSA, are normally harmless unless they enter the body through a scratch, cut, or wound, such as with surgery. All staph, including MRSA, can be spread from person-to-person by touching contaminated objects or through direct contact.  SPECIAL GROUPS  MRSA can present problems for special groups of people. Some of these groups include:  · Breastfeeding women.  · The most common problem is MRSA infection of the  breast (mastitis). There is evidence that MRSA can be passed to an infant from infected breast milk. Your caregiver may recommend that you stop breastfeeding until the mastitis is under control.  · If you are breastfeeding and have a MRSA infection in a place other than the breast, you may usually continue breastfeeding while under treatment. If taking antibiotics, ask your caregiver if it is safe to continue breastfeeding while taking your prescribed medicines.  · Neonates (babies from birth to 1 month old) and infants (babies from 1 month to 1 year old).  · There is evidence that MRSA can be passed to a newborn at birth if the mother has MRSA on the skin, in or around the birth canal, or an infection in the uterus, cervix, or vagina. MRSA infection can have the same appearance as a normal newborn or infant rash or several other skin infections. This can make it hard to diagnose MRSA.  · Immune compromised people.  · If you have an immune system problem, you may have a higher chance of developing a MRSA infection.  · People after any type of surgery.  · Staph in general, including MRSA, is the most common cause of infections occurring at the site of recent surgery.  · People on long-term steroid medicines.  · These kinds of medicines can lower your resistance to infection. This can increase your chance of getting MRSA.  · People who have had frequent hospitalizations, live in nursing homes or other residential care facilities, have venous or urinary catheters, or have taken multiple courses of antibiotic therapy for any reason.    DIAGNOSIS   Diagnosis of MRSA is done by cultures of fluid samples that may come from:  · Swabs taken from cuts or wounds in infected areas.  · Nasal swabs.  · Saliva or deep cough specimens from the lungs (sputum).  · Urine.  · Blood.  Many people are "colonized" with MRSA but have no signs of infection. This means that people carry the MRSA germ on their skin or in their nose and may  never develop MRSA infection.   TREATMENT   Treatment varies and is based on how serious, how deep, or how extensive the infection is. For example:  · Some skin infections, such as a small boil or abscess, may be treated by draining yellowish-white fluid (pus) from the site of the infection.  · Deeper or more widespread soft tissue infections are usually treated with surgery to drain pus and with antibiotic medicine given by vein or by mouth. This may be recommended even if you are pregnant.  · Serious infections may require a hospital stay.  If antibiotics are given, they may be needed for several weeks.  PREVENTION   Because many people are colonized with staph, including MRSA, preventing the spread of the bacteria from person-to-person is most important. The best way to prevent the spread of bacteria and other germs is through proper hand washing or by using alcohol-based hand disinfectants. The following are other ways to help prevent MRSA infection within the hospital and community settings.   · Healthcare settings:  · Strict hand washing or hand disinfection procedures need to be followed before and after touching every patient.  · Patients infected with MRSA are placed in isolation to prevent the spread of the bacteria.  · Healthcare workers need to wear disposable gowns and gloves when touching or caring for patients infected with MRSA. Visitors may also be asked to wear a gown and gloves.  · Hospital surfaces need to be disinfected frequently.  · Community settings:  · Wash your hands frequently with soap and water for at least 15 seconds. Otherwise, use alcohol-based hand disinfectants when soap and water is not available.  · Make sure people who live with you wash their hands often, too.  · Do not share personal items. For example, avoid sharing razors and other personal hygiene items, towels, clothing, and athletic equipment.  · Wash and dry your clothes and bedding at the warmest temperatures  recommended on the labels.  · Keep wounds covered. Pus from infected sores may contain MRSA and other bacteria. Keep cuts and abrasions clean and covered with germ-free (sterile), dry bandages until they are healed.  · If you have a wound that appears infected, ask your caregiver if a culture for MRSA and other bacteria should be done.  · If you are breastfeeding, talk to your caregiver about MRSA. You may be asked to temporarily stop breastfeeding.  HOME CARE INSTRUCTIONS   · Take your antibiotics as directed. Finish them even if you start to feel better.  · Avoid close contact with those around you as much as possible. Do not use towels, razors, toothbrushes, bedding, or other items that will be used by others.  · To fight the infection, follow your caregiver's instructions for wound care. Wash your hands before and after changing your bandages.  · If you have an intravascular device, such as a catheter, make sure you know how to care for it.  · Be sure to tell any healthcare providers that you have MRSA   so they are aware of your infection.  SEEK IMMEDIATE MEDICAL CARE IF:   · The infection appears to be getting worse. Signs include:  · Increased warmth, redness, or tenderness around the wound site.  · A red line that extends from the infection site.  · A dark color in the area around the infection.  · Wound drainage that is tan, yellow, or green.  · A bad smell coming from the wound.  · You feel sick to your stomach (nauseous) and throw up (vomit) or cannot keep medicine down.  · You have a fever.  · Your baby is older than 3 months with a rectal temperature of 102° F (38.9° C) or higher.  · Your baby is 3 months old or younger with a rectal temperature of 100.4° F (38° C) or higher.  · You have difficulty breathing.  MAKE SURE YOU:   · Understand these instructions.  · Will watch your condition.  · Will get help right away if you are not doing well or get worse.  Document Released: 04/14/2005 Document Revised:  07/07/2011 Document Reviewed: 07/17/2010  [employer]® Patient Information ©2014 [employer].

## 2013-09-18 NOTE — ED Notes (Signed)
Pt states that he has an abcess on the top of his head. Pt has hx of same. Pt denies any drainage from site. Pt noted to have 2 reddened raised areas to the top of his head

## 2013-09-18 NOTE — ED Provider Notes (Signed)
CSN: [id number]     Arrival date & time 09/18/13  1321 History  This chart was scribed for non-physician practitioner, Domenic Moras  , working with Wandra Arthurs, MD, by Allena Earing ED Scribe. This patient was seen in TR08C/TR08C and the patient's care was started at 2:24 PM.    Chief Complaint  Patient presents with  . Recurrent Skin Infections      The history is provided by the patient. No language interpreter was used.    HPI Comments: Jonathan King is a 48 y.o. male who presents to the Emergency Department complaining of a "couple of red bumps on my head" that have been present for around 1 week, they have not been bothering him until today. He states that they are a 2/10 pain wise and denies any drainage from the sites. He has had this problem before, he had been seen in January for similar symptoms and received anti biotics that helped. He reports that they drained by themselves last time. He denies any fever and HA.   Past Medical History  Diagnosis Date  . Back pain    Past Surgical History  Procedure Laterality Date  . Cholecystectomy  01/31/2012    Procedure: LAPAROSCOPIC CHOLECYSTECTOMY;  Surgeon: Gayland Curry, MD,FACS;  Location: [facility];  Service: General;  Laterality: N/A;   No family history on file. History  Substance Use Topics  . Smoking status: Never Smoker   . Smokeless tobacco: Not on file  . Alcohol Use: No    Review of Systems  Constitutional: Negative for fever, chills, activity change and appetite change.  Respiratory: Negative for cough.   Cardiovascular: Negative for chest pain.  Gastrointestinal: Negative for abdominal pain.  Musculoskeletal: Negative for arthralgias, back pain, gait problem, myalgias, neck pain and neck stiffness.  Skin: Positive for wound (on his scalp).  Neurological: Negative for dizziness, seizures, syncope, facial asymmetry, speech difficulty, weakness, light-headedness, numbness and headaches.      Allergies  Review of  patient's allergies indicates no known allergies.  Home Medications   Prior to Admission medications   Medication Sig Start Date End Date Taking? Authorizing Provider  ibuprofen (ADVIL,MOTRIN) 200 MG tablet Take 600 mg by mouth every 6 (six) hours as needed. For pain    Historical Provider, MD  PredniSONE 10 MG KIT 12 day dose pack po 06/29/13   Gregor Hams, MD  triamcinolone cream (KENALOG) 0.1 % Apply 1 application topically 2 (two) times daily. 06/29/13   Gregor Hams, MD  triamcinolone cream (KENALOG) 0.1 % Apply 1 application topically 3 (three) times daily. 07/01/13   Harden Mo, MD   BP 146/86  Pulse 95  Temp(Src) 98.6 F (37 C) (Oral)  Resp 20  SpO2 95% Physical Exam  Nursing note and vitals reviewed. Constitutional: He is oriented to person, place, and time. He appears well-developed.  HENT:  Head: Normocephalic.  Sparse area, area of mildly fluctuated lesion on the left parietal occipital region. Another located on the front of the scalp. Patches of erythema.   Eyes: Conjunctivae and EOM are normal. No scleral icterus.  Neck: Neck supple. No thyromegaly present.  Cardiovascular: Normal rate and regular rhythm.  Exam reveals no gallop and no friction rub.   No murmur heard. Pulmonary/Chest: No stridor. He has no wheezes. He has no rales. He exhibits no tenderness.  Abdominal: He exhibits no distension. There is no tenderness. There is no rebound.  Musculoskeletal: Normal range of motion. He exhibits  no edema.  Lymphadenopathy:    He has no cervical adenopathy.  Neurological: He is oriented to person, place, and time. He exhibits normal muscle tone. Coordination normal.  Skin: No rash noted. No erythema.  Psychiatric: He has a normal mood and affect. His behavior is normal.    ED Course  Procedures (including critical care time)  DIAGNOSTIC STUDIES: Oxygen Saturation is 95% on RA, normal by my interpretation.    COORDINATION OF CARE:   2:30 PM-Discussed treatment  plan which includes trying antibiotics again with pt at bedside and pt agreed to plan. Abscess likely does not need I&D at this time given formation of scab on each lesion to suggest drainage without I&D.  Recommend warm compress, take abx and take ibuprofen/tylenol for pain.  Pt to f/u with pcp for further care  Labs Review Labs Reviewed - No data to display  Imaging Review No results found.   EKG Interpretation None      MDM   Final diagnoses:  Abscess or cellulitis of scalp    BP 146/86  Pulse 95  Temp(Src) 98.6 F (37 C) (Oral)  Resp 20  SpO2 95%   I personally performed the services described in this documentation, which was scribed in my presence. The recorded information has been reviewed and is accurate.    Domenic Moras, PA-C 09/18/13 1435

## 2013-09-19 NOTE — ED Provider Notes (Signed)
Medical screening examination/treatment/procedure(s) were performed by non-physician practitioner and as supervising physician I was immediately available for consultation/collaboration.   EKG Interpretation None        Wandra Arthurs, MD 09/19/13 1504

## 2013-11-20 ENCOUNTER — Encounter (HOSPITAL_COMMUNITY): Payer: Self-pay | Admitting: Emergency Medicine

## 2013-11-20 ENCOUNTER — Emergency Department (HOSPITAL_COMMUNITY)
Admission: EM | Admit: 2013-11-20 | Discharge: 2013-11-20 | Disposition: A | Discharge status: Home / Self Care | Payer: Self-pay | Attending: Emergency Medicine | Admitting: Emergency Medicine

## 2013-11-20 DIAGNOSIS — L0291 Cutaneous abscess, unspecified: Secondary | ICD-10-CM

## 2013-11-20 DIAGNOSIS — IMO0002 Reserved for concepts with insufficient information to code with codable children: Secondary | ICD-10-CM | POA: Insufficient documentation

## 2013-11-20 DIAGNOSIS — L03818 Cellulitis of other sites: Principal | ICD-10-CM

## 2013-11-20 DIAGNOSIS — L02818 Cutaneous abscess of other sites: Secondary | ICD-10-CM | POA: Insufficient documentation

## 2013-11-20 DIAGNOSIS — Z792 Long term (current) use of antibiotics: Secondary | ICD-10-CM | POA: Insufficient documentation

## 2013-11-20 MED ORDER — CHLORHEXIDINE GLUCONATE 4 % EX LIQD: Freq: Every day | CUTANEOUS | Status: DC | PRN

## 2013-11-20 MED ORDER — MUPIROCIN CALCIUM 2 % NA OINT: TOPICAL_OINTMENT | NASAL | Status: DC

## 2013-11-20 MED ORDER — SULFAMETHOXAZOLE-TRIMETHOPRIM 800-160 MG PO TABS: 1.0000 | ORAL_TABLET | Freq: Two times a day (BID) | ORAL | Status: DC

## 2013-11-20 NOTE — Discharge Instructions (Signed)
MRSA Infection MRSA stands for methicillin-resistant Staphylococcus aureus. This type of infection is caused by Staphylococcus aureus bacteria that are no longer affected by the medicines used to kill them (drug resistant). Staphylococcus (staph) bacteria are normally found on the skin or in the nose of healthy people. In most cases, these bacteria do not cause infection. But if these resistant bacteria enter your body through a cut or sore, they can cause a serious infection on your skin or in other parts of your body. There is a slight chance that the staph on your skin or in your nose is MRSA. There are two types of MRSA infections:  Hospital-acquired MRSA is bacteria that you get in the hospital.  Community-acquired MRSA is bacteria that you get somewhere other than in a hospital. RISK FACTORS Hospital-acquired MRSA is more common. You could be at risk for this infection if you are in the hospital and you:  Have surgery or a procedure.  Have an IV access or a catheter tube placed in your body.  Have weak resistance to germs (weakened immune system).  Are elderly.  Are on kidney dialysis. You could be at risk for community-acquired MRSA if you have a break in your skin and come into contact with MRSA. This may happen if you:  Play sports where there is skin-to-skin contact.  Live in a crowded setting, like a dormitory or a [employer]  Share towels, razors, or sports equipment with other people. SYMPTOMS  Symptoms of hospital-acquired MRSA depend on where MRSA has spread. Symptoms may include:  Wound infection.  Skin infection.  Rash.  Pneumonia.  Fever and chills.  Difficulty breathing.  Chest pain. Community-acquired MRSA is most likely to start as a scratch or cut that becomes infected. Symptoms may include:  A pus-filled pimple.  A boil on your skin.  Pus draining from your skin.  A sore (abscess) under your skin or somewhere in your body.  Fever  with or without chills. DIAGNOSIS  The diagnosis of MRSA is made by taking a sample from an infected area and sending it to a lab for testing. A lab technician can grow (culture) MRSA and check it under a microscope. The cultured MRSA can be tested to see which type of antibiotic medicine will work to treat it. Newer tests can identify MRSA more quickly by testing bacteria samples for MRSA genes. Your health care provider can diagnose MRSA using samples from:   Cuts or wounds in infected areas.  Nasal swabs.  Saliva or cough specimens from deep in the lungs (sputum).  Urine.  Blood. You may also have:  Imaging studies (such as X-Krull or MRI) to check if the infection has spread to the lungs, bones, or joints.  A culture and sensitivity test of blood or fluids from inside the joints. TREATMENT  Treatment depends on how severe, deep, or extensive the infection is. Very bad infections may require a hospital stay.  Some skin infections, such as a small boil or sore (abscess), may be treated by draining pus from the site of the infection.  More extensive surgery to drain pus may be necessary for deeper or more widespread soft tissue infections.  You may then have to take antibiotic medicine given by mouth or through a vein. You may start antibiotic treatment right away or after testing can be done to see what antibiotic medicine should be used. HOME CARE INSTRUCTIONS   Take your antibiotics as directed by your health care provider. Take  the medicine as prescribed until it is finished.  Avoid close contact with those around you as much as possible. Do not use towels, razors, toothbrushes, bedding, or other items that will be used by others.  Wash your hands frequently for 15 seconds with soap and water. Dry your hands with a clean or disposable towel.  When you are not able to wash your hands, use hand sanitizer that is more than 60 percent alcohol.  Wash towels, sheets, or clothes in  the washing machine with detergent and hot water. Dry them in a hot dryer.  Follow your health care provider's instructions for wound care. Wash your hands before and after changing your bandages.  Always shower after exercising.  Keep all cuts and scrapes clean and covered with a bandage.  Be sure to tell all your health care providers that you have MRSA so they are aware of your infection. SEEK MEDICAL CARE IF:  You have a cut, scrape, pimple, or boil that becomes red, swollen, or painful or has pus in it.  You have pus draining from your skin.  You have an abscess under your skin or somewhere in your body. SEEK IMMEDIATE MEDICAL CARE IF:   You have symptoms of a skin infection with a fever or chills.  You have trouble breathing.  You have chest pain.  You have a skin wound and you become nauseous or start vomiting. MAKE SURE YOU:  Understand these instructions.  Will watch your condition.  Will get help right away if you are not doing well or get worse. Document Released: 04/14/2005 Document Revised: 04/19/2013 Document Reviewed: 02/04/2013 [facility] Patient Information 2015 [place], [place]. This information is not intended to replace advice given to you by your health care provider. Make sure you discuss any questions you have with your health care provider. Abscess Care After An abscess (also called a boil or furuncle) is an infected area that contains a collection of pus. Signs and symptoms of an abscess include pain, tenderness, redness, or hardness, or you may feel a moveable soft area under your skin. An abscess can occur anywhere in the body. The infection may spread to surrounding tissues causing cellulitis. A cut (incision) by the surgeon was made over your abscess and the pus was drained out. Gauze may have been packed into the space to provide a drain that will allow the cavity to heal from the inside outwards. The boil may be painful for 5 to 7 days. Most people with  a boil do not have high fevers. Your abscess, if seen early, may not have localized, and may not have been lanced. If not, another appointment may be required for this if it does not get better on its own or with medications. HOME CARE INSTRUCTIONS   Only take over-the-counter or prescription medicines for pain, discomfort, or fever as directed by your caregiver.  When you bathe, soak and then remove gauze or iodoform packs at least daily or as directed by your caregiver. You may then wash the wound gently with mild soapy water. Repack with gauze or do as your caregiver directs. SEEK IMMEDIATE MEDICAL CARE IF:   You develop increased pain, swelling, redness, drainage, or bleeding in the wound site.  You develop signs of generalized infection including muscle aches, chills, fever, or a general ill feeling.  An oral temperature above 102 F (38.9 C) develops, not controlled by medication. See your caregiver for a recheck if you develop any of the symptoms described above.  If medications (antibiotics) were prescribed, take them as directed. Document Released: 10/31/2004 Document Revised: 07/07/2011 Document Reviewed: 06/28/2007 [facility] Patient Information 2015 [place], [place]. This information is not intended to replace advice given to you by your health care provider. Make sure you discuss any questions you have with your health care provider.

## 2013-11-20 NOTE — ED Notes (Signed)
Declined W/C at D/C and was escorted to lobby by RN. 

## 2013-11-20 NOTE — ED Provider Notes (Signed)
CSN: [id number]     Arrival date & time 11/20/13  1622 History  This chart was scribed for non-physician provider Margarita Mail, PA-C, working with Osvaldo Shipper, MD by Irene Pap, ED Scribe. This patient was seen in room TR10C/TR10C and patient care was started at 4:57 PM.       Chief Complaint  Patient presents with  . Abscess   Patient is a 48 y.o. male presenting with abscess. The history is provided by the patient. No language interpreter was used.  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 male who presents to the Emergency Department complaining of an abscess on the left side of his head onset one week ago. He reports that the pain is intermittent which he rates 1-2/10 with associated drainage from the area. He states that while the pain is not severe, the abscess has grown in size over the course of the week. He denies fevers, chills, nausea or vomiting. He denies having a PCP. He denies any allergies to medications, or history of diabetes.   Past Medical History  Diagnosis Date  . Back pain    Past Surgical History  Procedure Laterality Date  . Cholecystectomy  01/31/2012    Procedure: LAPAROSCOPIC CHOLECYSTECTOMY;  Surgeon: Gayland Curry, MD,FACS;  Location: [facility];  Service: General;  Laterality: N/A;   No family history on file. History  Substance Use Topics  . Smoking status: Never Smoker   . Smokeless tobacco: Not on file  . Alcohol Use: Yes     Comment: occ    Review of Systems  Constitutional: Negative for fever and chills.  Gastrointestinal: Negative for nausea and vomiting.  Skin: Positive for rash (left scalp abscess).   Allergies  Review of patient's allergies indicates no known allergies.  Home Medications    Prior to Admission medications   Medication Sig Start Date End Date Taking? Authorizing Provider  cephALEXin (KEFLEX) 500 MG capsule Take 1 capsule (500 mg total) by mouth 4 (four) times daily. 09/18/13   Domenic Moras, PA-C  ibuprofen (ADVIL,MOTRIN) 200 MG tablet Take 600 mg by mouth every 6 (six) hours as needed. For pain    Historical Provider, MD  PredniSONE 10 MG KIT 12 day dose pack po 06/29/13   Gregor Hams, MD  triamcinolone cream (KENALOG) 0.1 % Apply 1 application topically 2 (two) times daily. 06/29/13   Gregor Hams, MD  triamcinolone cream (KENALOG) 0.1 % Apply 1 application topically 3 (three) times daily. 07/01/13   Harden Mo, MD   BP 142/91  Pulse 97  Temp(Src) 98.5 F (36.9 C) (Oral)  Resp 20  SpO2 96% Physical Exam  Nursing note and vitals reviewed. Constitutional: He is oriented to person, place, and time. He appears well-developed and well-nourished.  HENT:  Head: Normocephalic and atraumatic.  Raised erythematous 1.5 cm in diameter nodule with induration and white purulent discharge. Mild pain to palpation and excoriations.   Eyes: EOM are normal.  Neck: Normal range of motion. Neck supple.  Cardiovascular: Normal rate.   Pulmonary/Chest: Effort normal.  Musculoskeletal: Normal range of motion.  Neurological: He is alert and oriented to person, place, and time.  Skin: Skin is warm and dry.  Psychiatric: He has a  normal mood and affect. His behavior is normal.  [facility]  ED Course  Procedures (including critical care time) DIAGNOSTIC STUDIES: Oxygen Saturation is 96% on room air, normal by my interpretation.    COORDINATION OF CARE: 4:57 PM-Discussed treatment plan which includes abscess drainage with pt at bedside and pt agreed to plan.   Labs Review Labs Reviewed - No data to display  Imaging Review No results found.   EKG Interpretation None      INCISION AND DRAINAGE Performed by: Margarita Mail Consent: Verbal consent obtained. Risks and  benefits: risks, benefits and alternatives were discussed Type: abscess  Body area: Scalp  Anesthesia: local infiltration  Incision was made with a scalpel.  Local anesthetic: lidocaine 2% w/ epinephrine  Anesthetic total: 2 ml  Complexity: complex Blunt dissection to break up loculations  Drainage: purulent  Drainage amount: moderate  Packing material:N/A  Patient tolerance: Patient tolerated the procedure well with no immediate complications.     MDM   Final diagnoses:  Abscess   Patient with skin abscess amenable to incision and drainage.  Abscess was not large enough to warrant packing or drain..  Mild signs of cellulitis is surrounding skin.  Will d/c to home with antibiotic therapy due to hx of MRSA.  I personally performed the services described in this documentation, which was scribed in my presence. The recorded information has been reviewed and is accurate.    Margarita Mail, PA-C 11/20/13 1848

## 2013-11-20 NOTE — ED Notes (Signed)
Pt is here with abscess to left lateral head for 3-4 days.  Concerned for mrsa

## 2013-11-21 NOTE — ED Provider Notes (Signed)
Medical screening examination/treatment/procedure(s) were performed by non-physician practitioner and as supervising physician I was immediately available for consultation/collaboration.   EKG Interpretation None        Osvaldo Shipper, MD 11/21/13 [phone]

## 2014-06-19 ENCOUNTER — Emergency Department (HOSPITAL_COMMUNITY)
Admission: EM | Admit: 2014-06-19 | Discharge: 2014-06-19 | Disposition: A | Discharge status: Home / Self Care | Payer: Self-pay | Attending: Emergency Medicine | Admitting: Emergency Medicine

## 2014-06-19 ENCOUNTER — Encounter (HOSPITAL_COMMUNITY): Payer: Self-pay | Admitting: *Deleted

## 2014-06-19 DIAGNOSIS — Z792 Long term (current) use of antibiotics: Secondary | ICD-10-CM | POA: Insufficient documentation

## 2014-06-19 DIAGNOSIS — L0201 Cutaneous abscess of face: Secondary | ICD-10-CM | POA: Insufficient documentation

## 2014-06-19 DIAGNOSIS — Z23 Encounter for immunization: Secondary | ICD-10-CM | POA: Insufficient documentation

## 2014-06-19 DIAGNOSIS — Z79899 Other long term (current) drug therapy: Secondary | ICD-10-CM | POA: Insufficient documentation

## 2014-06-19 DIAGNOSIS — Z7952 Long term (current) use of systemic steroids: Secondary | ICD-10-CM | POA: Insufficient documentation

## 2014-06-19 MED ORDER — LIDOCAINE HCL 2 % IJ SOLN
5.0000 mL | Freq: Once | INTRAMUSCULAR | Status: AC
  Administered 2014-06-19: 100 mg via INTRADERMAL
  Filled 2014-06-19: qty 20

## 2014-06-19 MED ORDER — DOXYCYCLINE HYCLATE 100 MG PO CAPS: 100.0000 mg | ORAL_CAPSULE | Freq: Two times a day (BID) | ORAL | Status: DC

## 2014-06-19 MED ORDER — HYDROCODONE-ACETAMINOPHEN 5-325 MG PO TABS
1.0000 | ORAL_TABLET | Freq: Four times a day (QID) | ORAL | Status: DC | PRN
Start: 2014-06-19 — End: 2016-06-23

## 2014-06-19 MED ORDER — TETANUS-DIPHTH-ACELL PERTUSSIS 5-2.5-18.5 LF-MCG/0.5 IM SUSP
0.5000 mL | Freq: Once | INTRAMUSCULAR | Status: AC
  Administered 2014-06-19: 0.5 mL via INTRAMUSCULAR
  Filled 2014-06-19: qty 0.5

## 2014-06-19 NOTE — Discharge Instructions (Signed)
Apply warm compress to affected area several times daily for the next 4-5 days.  Take antibiotic and pain medication as prescribed.  Return if your condition worsen after 48 hrs or if you have other concerns.  Abscess Care After An abscess (also called a boil or furuncle) is an infected area that contains a collection of pus. Signs and symptoms of an abscess include pain, tenderness, redness, or hardness, or you may feel a moveable soft area under your skin. An abscess can occur anywhere in the body. The infection may spread to surrounding tissues causing cellulitis. A cut (incision) by the surgeon was made over your abscess and the pus was drained out. Gauze may have been packed into the space to provide a drain that will allow the cavity to heal from the inside outwards. The boil may be painful for 5 to 7 days. Most people with a boil do not have high fevers. Your abscess, if seen early, may not have localized, and may not have been lanced. If not, another appointment may be required for this if it does not get better on its own or with medications. HOME CARE INSTRUCTIONS   Only take over-the-counter or prescription medicines for pain, discomfort, or fever as directed by your caregiver.  When you bathe, soak and then remove gauze or iodoform packs at least daily or as directed by your caregiver. You may then wash the wound gently with mild soapy water. Repack with gauze or do as your caregiver directs. SEEK IMMEDIATE MEDICAL CARE IF:   You develop increased pain, swelling, redness, drainage, or bleeding in the wound site.  You develop signs of generalized infection including muscle aches, chills, fever, or a general ill feeling.  An oral temperature above 102 F (38.9 C) develops, not controlled by medication. See your caregiver for a recheck if you develop any of the symptoms described above. If medications (antibiotics) were prescribed, take them as directed. Document Released: 10/31/2004  Document Revised: 07/07/2011 Document Reviewed: 06/28/2007 [facility] Patient Information 2015 [place], [place]. This information is not intended to replace advice given to you by your health care provider. Make sure you discuss any questions you have with your health care provider.

## 2014-06-19 NOTE — ED Provider Notes (Signed)
CSN: [id number]     Arrival date & time 06/19/14  0825 History   First MD Initiated Contact with Patient 06/19/14 [phone]     No chief complaint on file.    (Consider location/radiation/quality/duration/timing/severity/associated sxs/prior Treatment) HPI   49 year old male who presents for evaluation of a wound check on his head.  Pt notice a bump on L side of forehead x 3 weeks.  It has increased in size, with pustular discharge and tender to palpation.  He has been cleaning with peroxide and neosporin.  Pain is dull, 1/10 and no fever.  Has similar wound in the past.  No fever, nausea, sore throat, headache, vision changes or numbness.    Past Medical History  Diagnosis Date  . Back pain    Past Surgical History  Procedure Laterality Date  . Cholecystectomy  01/31/2012    Procedure: LAPAROSCOPIC CHOLECYSTECTOMY;  Surgeon: Gayland Curry, MD,FACS;  Location: [facility];  Service: General;  Laterality: N/A;   No family history on file. History  Substance Use Topics  . Smoking status: Never Smoker   . Smokeless tobacco: Not on file  . Alcohol Use: Yes     Comment: occ    Review of Systems  Constitutional: Negative for fever.  Skin: Positive for rash.  Neurological: Negative for headaches.      Allergies  Review of patient's allergies indicates no known allergies.  Home Medications   Prior to Admission medications   Medication Sig Start Date End Date Taking? Authorizing Provider  cephALEXin (KEFLEX) 500 MG capsule Take 1 capsule (500 mg total) by mouth 4 (four) times daily. 09/18/13   Domenic Moras, PA-C  chlorhexidine (HIBICLENS) 4 % external liquid Apply topically daily as needed. 11/20/13   Margarita Mail, PA-C  ibuprofen (ADVIL,MOTRIN) 200 MG tablet Take 600 mg by mouth every 6 (six) hours as needed. For pain    Historical Provider, MD  mupirocin nasal ointment (BACTROBAN) 2 % Apply in each nostril daily 11/20/13   Margarita Mail, PA-C  PredniSONE 10 MG KIT 12 day dose pack po  06/29/13   Gregor Hams, MD  sulfamethoxazole-trimethoprim (SEPTRA DS) 800-160 MG per tablet Take 1 tablet by mouth every 12 (twelve) hours. 11/20/13   Margarita Mail, PA-C  triamcinolone cream (KENALOG) 0.1 % Apply 1 application topically 2 (two) times daily. 06/29/13   Gregor Hams, MD  triamcinolone cream (KENALOG) 0.1 % Apply 1 application topically 3 (three) times daily. 07/01/13   Harden Mo, MD   There were no vitals taken for this visit. Physical Exam  Constitutional: He appears well-developed and well-nourished. No distress.  Pt stuttered  HENT:  Head: Atraumatic.  Forehead: an area of induration and fluctuance with surrounding erythema measuring a dime size, ttp.  Eyes: Conjunctivae are normal.  Neck: Normal range of motion. Neck supple.  Neurological: He is alert.  Skin: No rash noted.  Psychiatric: He has a normal mood and affect.    ED Course  Procedures (including critical care time)  9:19 AM Pt with an abscess cellulitis affecting forehead.  Abscess was I&D by me.  tdap given, abx and pain medication prescribed as pt has surrounding cellulitis as well.  Doubt kerion.  Warm compress and return precaution discussed.   INCISION AND DRAINAGE Performed by: Domenic Moras Consent: Verbal consent obtained. Risks and benefits: risks, benefits and alternatives were discussed Type: abscess  Body area: forehead, Left  Anesthesia: local infiltration  Incision was made with a scalpel.  Local anesthetic: lidocaine  2% w/o epinephrine  Anesthetic total: 2 ml  Complexity: complex Blunt dissection to break up loculations  Drainage: purulent  Drainage amount: small  Packing material: none  Patient tolerance: Patient tolerated the procedure well with no immediate complications.     Labs Review Labs Reviewed - No data to display  Imaging Review No results found.   EKG Interpretation None      MDM   Final diagnoses:  Abscess of forehead    BP 133/91 mmHg   Pulse 94  Temp(Src) 98.3 F (36.8 C)  Resp 16  Ht 6' (1.829 m)  Wt 190 lb (86.183 kg)  BMI 25.76 kg/m2  SpO2 95%     Domenic Moras, PA-C 06/19/14 3014  Blanchie Dessert, MD 06/19/14 1550

## 2014-06-19 NOTE — ED Notes (Signed)
Declined W/C at D/C and was escorted to lobby by RN. 

## 2014-06-19 NOTE — ED Notes (Signed)
Pt report infection has been there for several days. Pt has a raised ,red area on fore head.

## 2014-10-13 ENCOUNTER — Emergency Department (HOSPITAL_COMMUNITY)
Admission: EM | Admit: 2014-10-13 | Discharge: 2014-10-13 | Disposition: A | Discharge status: Home / Self Care | Payer: Self-pay | Attending: Emergency Medicine | Admitting: Emergency Medicine

## 2014-10-13 ENCOUNTER — Encounter (HOSPITAL_COMMUNITY): Payer: Self-pay | Admitting: *Deleted

## 2014-10-13 DIAGNOSIS — Z79899 Other long term (current) drug therapy: Secondary | ICD-10-CM | POA: Insufficient documentation

## 2014-10-13 DIAGNOSIS — L02811 Cutaneous abscess of head [any part, except face]: Secondary | ICD-10-CM | POA: Insufficient documentation

## 2014-10-13 MED ORDER — CLINDAMYCIN HCL 300 MG PO CAPS: 300.0000 mg | ORAL_CAPSULE | Freq: Four times a day (QID) | ORAL | Status: DC

## 2014-10-13 MED ORDER — LIDOCAINE HCL (PF) 2 % IJ SOLN
0.0000 mL | Freq: Once | INTRAMUSCULAR | Status: AC | PRN
  Administered 2014-10-13: 10 mL via INTRADERMAL
  Filled 2014-10-13: qty 20

## 2014-10-13 NOTE — ED Provider Notes (Signed)
CSN: [id number]     Arrival date & time 10/13/14  1615 History  This chart was scribed for non-physician practitioner Al Corpus, PA-C working with Lacretia Leigh, MD by Meriel Pica, ED Scribe. This patient was seen in room TR08C/TR08C and the patient's care was started at 5:12 PM.   Chief Complaint  Patient presents with  . Abscess   The history is provided by the patient. No language interpreter was used.   HPI Comments: Jonathan King is a 49 y.o. male, with a PMhx of back pain and abscesses, who presents to the Emergency Department complaining of a gradually worsening area of pain and swelling to the back of his head that began 7 days ago. He reports associated purulent drainage that he noticed today. He was seen 4 months ago for similar symptoms but in a different location on his head. Pt denies fevers, chills, nausea, vomiting, or any recent injury to the head. Tetanus updated February 2016. Patient with history of MRSA.   Past Medical History  Diagnosis Date  . Back pain    Past Surgical History  Procedure Laterality Date  . Cholecystectomy  01/31/2012    Procedure: LAPAROSCOPIC CHOLECYSTECTOMY;  Surgeon: Gayland Curry, MD,FACS;  Location: [facility];  Service: General;  Laterality: N/A;   No family history on file. History  Substance Use Topics  . Smoking status: Never Smoker   . Smokeless tobacco: Not on file  . Alcohol Use: Yes     Comment: occ    Review of Systems  Constitutional: Negative for fever and chills.  Gastrointestinal: Negative for nausea and vomiting.  Skin: Positive for color change and wound.   Allergies  Review of patient's allergies indicates no known allergies.  Home Medications   Prior to Admission medications   Medication Sig Start Date End Date Taking? Authorizing Provider  chlorhexidine (HIBICLENS) 4 % external liquid Apply topically daily as needed. 11/20/13   Margarita Mail, PA-C  clindamycin (CLEOCIN) 300 MG capsule Take 1 capsule (300 mg  total) by mouth 4 (four) times daily. X 7 days 10/13/14   Al Corpus, PA-C  doxycycline (VIBRAMYCIN) 100 MG capsule Take 1 capsule (100 mg total) by mouth 2 (two) times daily. One po bid x 7 days 06/19/14   Domenic Moras, PA-C  HYDROcodone-acetaminophen (NORCO/VICODIN) 5-325 MG per tablet Take 1 tablet by mouth every 6 (six) hours as needed for moderate pain. 06/19/14   Domenic Moras, PA-C  ibuprofen (ADVIL,MOTRIN) 200 MG tablet Take 600 mg by mouth every 6 (six) hours as needed. For pain    Historical Provider, MD  mupirocin nasal ointment (BACTROBAN) 2 % Apply in each nostril daily 11/20/13   Margarita Mail, PA-C  PredniSONE 10 MG KIT 12 day dose pack po 06/29/13   Gregor Hams, MD  sulfamethoxazole-trimethoprim (SEPTRA DS) 800-160 MG per tablet Take 1 tablet by mouth every 12 (twelve) hours. 11/20/13   Margarita Mail, PA-C  triamcinolone cream (KENALOG) 0.1 % Apply 1 application topically 2 (two) times daily. 06/29/13   Gregor Hams, MD  triamcinolone cream (KENALOG) 0.1 % Apply 1 application topically 3 (three) times daily. 07/01/13   Harden Mo, MD   BP 134/86 mmHg  Pulse 97  Temp(Src) 97.5 F (36.4 C) (Oral)  Resp 18  Wt 211 lb 8 oz (95.936 kg)  SpO2 95% Physical Exam  Constitutional: He appears well-developed and well-nourished. No distress.  HENT:  Head: Normocephalic and atraumatic.  Eyes: Conjunctivae are normal. Right eye exhibits  no discharge. Left eye exhibits no discharge.  Pulmonary/Chest: Effort normal. No respiratory distress.  Neurological: He is alert. Coordination normal.  Skin: He is not diaphoretic.  2 cm fluctuant mass to left occipital scalp with purulent drainage with mild signs of cellulitis.   Psychiatric: He has a normal mood and affect. His behavior is normal.  Nursing note and vitals reviewed.  ED Course  Procedures  DIAGNOSTIC STUDIES: Oxygen Saturation is 95% on RA, adequate by my interpretation.    COORDINATION OF CARE: 5:14 PM Discussed treatment plan  which includes to perform an I&D procedure. Pt acknowledges and agrees to plan.   INCISION AND DRAINAGE PROCEDURE NOTE: Patient identification was confirmed and verbal consent was obtained. This procedure was performed by Al Corpus, PA-C at 5:34 PM. Site: Left occipital scalp Sterile procedures observed Anesthetic used (type and amt): Lidocaine 2% injection w/o epinephrine  Blade size: 11 Drainage: All Complexity: Complex Packing used 1/4 inch iodoform Site anesthetized, incision made over site, wound drained and explored loculations, rinsed with copious amounts of normal saline, wound packed with sterile gauze, covered with dry, sterile dressing.  Pt tolerated procedure well without complications.  Instructions for care discussed verbally and pt provided with additional written instructions for homecare and f/u.  Labs Review Labs Reviewed - No data to display  Imaging Review No results found.   EKG Interpretation None      MDM   Final diagnoses:  Abscess, scalp   Patient with history of MRSA presenting with left scalp abscess. He states his own new location. No systemic symptoms. IND performed without immediate complications. Patient placed on clindamycin due to history of MRSA. He denies other medical problems. Patient to follow-up at urgent care in 2 days for wound recheck.  Discussed return precautions with patient. Patient verbalizes understanding and agrees with plan.  I personally performed the services described in this documentation, which was scribed in my presence. The recorded information has been reviewed and is accurate.   Al Corpus, PA-C 10/13/14 1749  Lacretia Leigh, MD 10/14/14 2125

## 2014-10-13 NOTE — ED Notes (Signed)
The pt is c/o an abscess on his scalp  .  He is afraid that his Jonathan King has returned.  It has been there for 2-3 weeks

## 2014-10-13 NOTE — Discharge Instructions (Signed)
Return to the emergency room with worsening of symptoms, new symptoms or with symptoms that are concerning, especially fevers, chills, redness, swelling, large amount of pus. Continue with warm soaks. Follow up with urgent care in 2 days for wound recheck. Read below information and follow recommendations.  Abscess Care After An abscess (also called a boil or furuncle) is an infected area that contains a collection of pus. Signs and symptoms of an abscess include pain, tenderness, redness, or hardness, or you may feel a moveable soft area under your skin. An abscess can occur anywhere in the body. The infection may spread to surrounding tissues causing cellulitis. A cut (incision) by the surgeon was made over your abscess and the pus was drained out. Gauze may have been packed into the space to provide a drain that will allow the cavity to heal from the inside outwards. The boil may be painful for 5 to 7 days. Most people with a boil do not have high fevers. Your abscess, if seen early, may not have localized, and may not have been lanced. If not, another appointment may be required for this if it does not get better on its own or with medications. HOME CARE INSTRUCTIONS   Only take over-the-counter or prescription medicines for pain, discomfort, or fever as directed by your caregiver.  When you bathe, soak and then remove gauze or iodoform packs at least daily or as directed by your caregiver. You may then wash the wound gently with mild soapy water. Repack with gauze or do as your caregiver directs. SEEK IMMEDIATE MEDICAL CARE IF:   You develop increased pain, swelling, redness, drainage, or bleeding in the wound site.  You develop signs of generalized infection including muscle aches, chills, fever, or a general ill feeling.  An oral temperature above 102 F (38.9 C) develops, not controlled by medication. See your caregiver for a recheck if you develop any of the symptoms described above. If  medications (antibiotics) were prescribed, take them as directed. Document Released: 10/31/2004 Document Revised: 07/07/2011 Document Reviewed: 06/28/2007 [facility] Patient Information 2015 [place], [place]. This information is not intended to replace advice given to you by your health care provider. Make sure you discuss any questions you have with your health care provider.

## 2016-05-28 ENCOUNTER — Emergency Department (HOSPITAL_COMMUNITY)
Admission: EM | Admit: 2016-05-28 | Discharge: 2016-05-28 | Disposition: A | Discharge status: Home / Self Care | Payer: Self-pay | Attending: Emergency Medicine | Admitting: Emergency Medicine

## 2016-05-28 ENCOUNTER — Encounter (HOSPITAL_COMMUNITY): Payer: Self-pay

## 2016-05-28 DIAGNOSIS — Z79899 Other long term (current) drug therapy: Secondary | ICD-10-CM | POA: Insufficient documentation

## 2016-05-28 DIAGNOSIS — R21 Rash and other nonspecific skin eruption: Secondary | ICD-10-CM | POA: Insufficient documentation

## 2016-05-28 MED ORDER — HYDROXYZINE HCL 10 MG PO TABS: 10.0000 mg | ORAL_TABLET | Freq: Four times a day (QID) | ORAL | 0 refills | Status: DC | PRN

## 2016-05-28 MED ORDER — PREDNISONE 20 MG PO TABS: 40.0000 mg | ORAL_TABLET | Freq: Every day | ORAL | 0 refills | Status: DC

## 2016-05-28 MED ORDER — HYDROXYZINE HCL 10 MG PO TABS
10.0000 mg | ORAL_TABLET | Freq: Once | ORAL | Status: AC
  Administered 2016-05-28: 10 mg via ORAL
  Filled 2016-05-28: qty 1

## 2016-05-28 MED ORDER — PREDNISONE 20 MG PO TABS
60.0000 mg | ORAL_TABLET | Freq: Once | ORAL | Status: AC
Start: 2016-05-28 — End: 2016-05-28
  Administered 2016-05-28: 60 mg via ORAL
  Filled 2016-05-28: qty 3

## 2016-05-28 MED ORDER — PERMETHRIN 5 % EX CREA: TOPICAL_CREAM | CUTANEOUS | 0 refills | Status: DC

## 2016-05-28 NOTE — ED Provider Notes (Signed)
[facility] DEPT Provider Note   CSN: [id number] Arrival date & time: 05/28/16  1154  By signing my name below, I, Higinio Plan, attest that this documentation has been prepared under the direction and in the presence of Harlene Ramus, PA-C . Electronically Signed: Higinio Plan, Scribe. 05/28/2016. 12:36 PM.  History   Chief Complaint Chief Complaint  Patient presents with  . Allergic Reaction   The history is provided by the patient and a friend. No language interpreter was used.   HPI Comments: Jonathan King is a 51 y.o. male who presents to the Emergency Department complaining of gradually worsening, generalized, pruritic rash that began ~1 week ago. Pt reports his rash began on his bilateral arms and recently spread to his chest, neck, and bilateral upper extremities. He notes he has experienced difficulty sleeping due to his symptoms and states his "itching" is worse at night. Pt reports he has applied [place] topical lotion over his rash with temporary relief. He states this is the first time he has experienced similar symptoms. Pt's friend present in the room notes he recently experienced similar symptoms in which he was diagnosed and treated with scabies ~2 weeks ago. However, he states he only picked up his prescription medication for this 2 days ago. Pt denies any drainage from his rash, fever, use of any new detergents, lotions, body washes, new clothes, sheets or foods, lesions on his feet, sensation of his throat closing, shortness of breath, abdominal pain, and vomiting.   Past Medical History:  Diagnosis Date  . Back pain    Patient Active Problem List   Diagnosis Date Noted  . MRSA infection 05/23/2013  . Acute cholecystitis 01/30/2012   Past Surgical History:  Procedure Laterality Date  . CHOLECYSTECTOMY  01/31/2012   Procedure: LAPAROSCOPIC CHOLECYSTECTOMY;  Surgeon: Gayland Curry, MD,FACS;  Location: [facility];  Service: General;  Laterality: N/A;    Home Medications     Prior to Admission medications   Medication Sig Start Date End Date Taking? Authorizing Provider  chlorhexidine (HIBICLENS) 4 % external liquid Apply topically daily as needed. 11/20/13   Margarita Mail, PA-C  clindamycin (CLEOCIN) 300 MG capsule Take 1 capsule (300 mg total) by mouth 4 (four) times daily. X 7 days 10/13/14   Al Corpus, PA-C  doxycycline (VIBRAMYCIN) 100 MG capsule Take 1 capsule (100 mg total) by mouth 2 (two) times daily. One po bid x 7 days 06/19/14   Domenic Moras, PA-C  HYDROcodone-acetaminophen (NORCO/VICODIN) 5-325 MG per tablet Take 1 tablet by mouth every 6 (six) hours as needed for moderate pain. 06/19/14   Domenic Moras, PA-C  hydrOXYzine (ATARAX/VISTARIL) 10 MG tablet Take 1 tablet (10 mg total) by mouth every 6 (six) hours as needed for itching. 05/28/16   Nona Dell, PA-C  ibuprofen (ADVIL,MOTRIN) 200 MG tablet Take 600 mg by mouth every 6 (six) hours as needed. For pain    Historical Provider, MD  mupirocin nasal ointment (BACTROBAN) 2 % Apply in each nostril daily 11/20/13   Margarita Mail, PA-C  permethrin (ELIMITE) 5 % cream Apply to affected area once 05/28/16   Nona Dell, PA-C  predniSONE (DELTASONE) 20 MG tablet Take 2 tablets (40 mg total) by mouth daily. 05/28/16   Nona Dell, PA-C  sulfamethoxazole-trimethoprim (SEPTRA DS) 800-160 MG per tablet Take 1 tablet by mouth every 12 (twelve) hours. 11/20/13   Margarita Mail, PA-C  triamcinolone cream (KENALOG) 0.1 % Apply 1 application topically 2 (two) times daily.  06/29/13   Gregor Hams, MD  triamcinolone cream (KENALOG) 0.1 % Apply 1 application topically 3 (three) times daily. 07/01/13   Harden Mo, MD    Family History History reviewed. No pertinent family history.  Social History Social History  Substance Use Topics  . Smoking status: Never Smoker  . Smokeless tobacco: Never Used  . Alcohol use Yes     Comment: occ     Allergies   Patient has no known  allergies.   Review of Systems Review of Systems  Constitutional: Negative for fever.  HENT: Negative for trouble swallowing.   Respiratory: Negative for shortness of breath.   Gastrointestinal: Negative for abdominal pain and vomiting.  Skin: Positive for rash.  Psychiatric/Behavioral: Positive for sleep disturbance (due to rash).   Physical Exam Updated Vital Signs BP 134/85 (BP Location: Left Arm)   Pulse 85   Temp 97.9 F (36.6 C) (Oral)   Resp 18   SpO2 99%   Physical Exam  Constitutional: He is oriented to person, place, and time. He appears well-developed and well-nourished.  HENT:  Head: Normocephalic and atraumatic.  Mouth/Throat: Uvula is midline, oropharynx is clear and moist and mucous membranes are normal. No oral lesions. No oropharyngeal exudate, posterior oropharyngeal edema, posterior oropharyngeal erythema or tonsillar abscesses. No tonsillar exudate.  Eyes: Conjunctivae and EOM are normal. Right eye exhibits no discharge. Left eye exhibits no discharge. No scleral icterus.  Cardiovascular: Normal rate, regular rhythm, normal heart sounds and intact distal pulses.   Pulmonary/Chest: Effort normal and breath sounds normal.  Abdominal: Soft. Bowel sounds are normal. There is no tenderness.  Musculoskeletal: He exhibits no edema.  Neurological: He is alert and oriented to person, place, and time.  Skin: Skin is warm and dry.  Multiple diffuse erythematous papules noted to interdigits, bilateral upper arms, anterior and posterior neck, and lateral chest with few linear burrows noted. Pt continues to aggressively scratch his skin throughout exam. Multiple excoriations present. No active bleeding or drainage noted. No pustules or bola present. No lesions on palms or soles.   Nursing note and vitals reviewed.  ED Treatments / Results  Labs (all labs ordered are listed, but only abnormal results are displayed) Labs Reviewed - No data to display  EKG  EKG  Interpretation None       Radiology No results found.  Procedures Procedures (including critical care time)  Medications Ordered in ED Medications  predniSONE (DELTASONE) tablet 60 mg (not administered)  hydrOXYzine (ATARAX/VISTARIL) tablet 10 mg (not administered)    DIAGNOSTIC STUDIES:  Oxygen Saturation is 99% on RA, normal by my interpretation.    COORDINATION OF CARE:  12:29 PM Discussed treatment plan with pt at bedside and pt agreed to plan.  Initial Impression / Assessment and Plan / ED Course  I have reviewed the triage vital signs and the nursing notes.  Pertinent labs & imaging results that were available during my care of the patient were reviewed by me and considered in my medical decision making (see chart for details).     Rash consistent with scabies. Patient denies any difficulty breathing or swallowing.  Pt has a patent airway without stridor and is handling secretions without difficulty; no angioedema. Patient reports his roommate was diagnosed with scabies personally 2 weeks ago but notes he just picked up his medications 2 days ago. No blisters, no pustules, no warmth, no draining sinus tracts, no superficial abscesses, no bullous impetigo, no vesicles, no desquamation, no target lesions  with dusky purpura or a central bulla. Not tender to touch. No concern for superimposed infection. No concern for SJS, TEN, TSS, tick borne illness, syphilis or other life-threatening condition. Will discharge home with short course of steroids, permethrin and vistaril. Advised pt to wash all his linens/clothes in hot water. Patient given information to follow up with PCP as needed. Discussed return precautions.    I personally performed the services described in this documentation, which was scribed in my presence. The recorded information has been reviewed and is accurate.   Final Clinical Impressions(s) / ED Diagnoses   Final diagnoses:  Rash    New  Prescriptions New Prescriptions   HYDROXYZINE (ATARAX/VISTARIL) 10 MG TABLET    Take 1 tablet (10 mg total) by mouth every 6 (six) hours as needed for itching.   PERMETHRIN (ELIMITE) 5 % CREAM    Apply to affected area once   PREDNISONE (DELTASONE) 20 MG TABLET    Take 2 tablets (40 mg total) by mouth daily.     Chesley Noon [place], [place] 05/28/16 [place] Y[place], MD 05/28/16 1600

## 2016-05-28 NOTE — Discharge Instructions (Signed)
Take your medication as prescribed. I also recommend washing all linens and close in hot water.  Follow-up with a primary care provider's office below within the next week as needed. Return to the emergency department if symptoms worsen or new onset of fever, oral lesions, difficulty breathing, facial/neck swelling, vomiting, abdominal pain, drainage, new/worsening rash.

## 2016-05-28 NOTE — ED Triage Notes (Signed)
Pt presents with 1 week h/o generalized itching.  Pt denies any new topicals, soaps, detergents, foods or medication.  Male with pt reports she has had same and treated for scabies x 2 weeks ago.

## 2016-06-12 ENCOUNTER — Emergency Department (HOSPITAL_COMMUNITY): Payer: Self-pay

## 2016-06-12 ENCOUNTER — Encounter (HOSPITAL_COMMUNITY): Payer: Self-pay

## 2016-06-12 ENCOUNTER — Emergency Department (HOSPITAL_COMMUNITY)
Admission: EM | Admit: 2016-06-12 | Discharge: 2016-06-12 | Disposition: A | Discharge status: Home / Self Care | Payer: Self-pay | Attending: Emergency Medicine | Admitting: Emergency Medicine

## 2016-06-12 DIAGNOSIS — M25831 Other specified joint disorders, right wrist: Secondary | ICD-10-CM | POA: Insufficient documentation

## 2016-06-12 DIAGNOSIS — M79644 Pain in right finger(s): Secondary | ICD-10-CM

## 2016-06-12 MED ORDER — IBUPROFEN 600 MG PO TABS: 600.0000 mg | ORAL_TABLET | Freq: Four times a day (QID) | ORAL | 0 refills | Status: DC | PRN

## 2016-06-12 NOTE — ED Notes (Signed)
Pt states he understands instructions. Home stable with steady gait. 

## 2016-06-12 NOTE — ED Notes (Signed)
Pt denies injury c/o opain at left wrist.

## 2016-06-12 NOTE — ED Provider Notes (Signed)
[facility] DEPT Provider Note   CSN: [id number] Arrival date & time: 06/12/16  1325   By signing my name below, I, Evelene Croon, attest that this documentation has been prepared under the direction and in the presence of Varney Biles, MD . Electronically Signed: Evelene Croon, Scribe. 06/12/2016. 3:25 PM.   History   Chief Complaint Chief Complaint  Patient presents with  . Arm Pain    The history is provided by the patient. No language interpreter was used.     HPI Comments:  Jonathan King is a 51 y.o. male who presents to the Emergency Department complaining of moderate pain to the left thumb that radiates up the LUE. He has been experiencing this pain for a few days but the radiation up the arm began yesterday. No recent trauma or injury. He reports frequently lifting at work; denies frequent typing.  No alleviating factors noted. Pt has no other acute complaints or associated symptoms at this time.   Past Medical History:  Diagnosis Date  . Back pain     Patient Active Problem List   Diagnosis Date Noted  . MRSA infection 05/23/2013  . Acute cholecystitis 01/30/2012    Past Surgical History:  Procedure Laterality Date  . CHOLECYSTECTOMY  01/31/2012   Procedure: LAPAROSCOPIC CHOLECYSTECTOMY;  Surgeon: Gayland Curry, MD,FACS;  Location: [facility];  Service: General;  Laterality: N/A;       Home Medications    Prior to Admission medications   Medication Sig Start Date End Date Taking? Authorizing Provider  chlorhexidine (HIBICLENS) 4 % external liquid Apply topically daily as needed. 11/20/13   Margarita Mail, PA-C  clindamycin (CLEOCIN) 300 MG capsule Take 1 capsule (300 mg total) by mouth 4 (four) times daily. X 7 days 10/13/14   Al Corpus, PA-C  doxycycline (VIBRAMYCIN) 100 MG capsule Take 1 capsule (100 mg total) by mouth 2 (two) times daily. One po bid x 7 days 06/19/14   Domenic Moras, PA-C  HYDROcodone-acetaminophen (NORCO/VICODIN) 5-325 MG per tablet Take  1 tablet by mouth every 6 (six) hours as needed for moderate pain. 06/19/14   Domenic Moras, PA-C  hydrOXYzine (ATARAX/VISTARIL) 10 MG tablet Take 1 tablet (10 mg total) by mouth every 6 (six) hours as needed for itching. 05/28/16   Nona Dell, PA-C  ibuprofen (ADVIL,MOTRIN) 200 MG tablet Take 600 mg by mouth every 6 (six) hours as needed. For pain    Historical Provider, MD  mupirocin nasal ointment (BACTROBAN) 2 % Apply in each nostril daily 11/20/13   Margarita Mail, PA-C  permethrin (ELIMITE) 5 % cream Apply to affected area once 05/28/16   Nona Dell, PA-C  predniSONE (DELTASONE) 20 MG tablet Take 2 tablets (40 mg total) by mouth daily. 05/28/16   Nona Dell, PA-C  sulfamethoxazole-trimethoprim (SEPTRA DS) 800-160 MG per tablet Take 1 tablet by mouth every 12 (twelve) hours. 11/20/13   Margarita Mail, PA-C  triamcinolone cream (KENALOG) 0.1 % Apply 1 application topically 2 (two) times daily. 06/29/13   Gregor Hams, MD  triamcinolone cream (KENALOG) 0.1 % Apply 1 application topically 3 (three) times daily. 07/01/13   Harden Mo, MD    Family History History reviewed. No pertinent family history.  Social History Social History  Substance Use Topics  . Smoking status: Never Smoker  . Smokeless tobacco: Never Used  . Alcohol use Yes     Comment: occ     Allergies   Patient has no known allergies.  Review of Systems Review of Systems  Musculoskeletal: Positive for arthralgias and myalgias.  Neurological: Negative for weakness.        Physical Exam Updated Vital Signs BP 135/96 (BP Location: Right Arm)   Pulse 87   Temp 98.5 F (36.9 C) (Oral)   Resp 18   Ht 6' (1.829 m)   Wt 210 lb (95.3 kg)   SpO2 96%   BMI 28.48 kg/m   Physical Exam  Constitutional: He is oriented to person, place, and time. He appears well-developed and well-nourished. No distress.  HENT:  Head: Normocephalic and atraumatic.  Eyes: Conjunctivae are normal.    Cardiovascular: Normal rate.   Pulmonary/Chest: Effort normal.  Abdominal: He exhibits no distension.  Musculoskeletal: He exhibits no deformity.  Pt has tenderness at the base of the right thumb. The tenderness is not present at the MCP joint. No tendermess with finkerstein's or eichhoff;s test. No gross swelling, skin is warm to touch  No deformity  Abduction, adduction, and oppsotion of thumb is normal    Neurological: He is alert and oriented to person, place, and time.  Skin: Skin is warm and dry.  Psychiatric: He has a normal mood and affect.  Nursing note and vitals reviewed.    ED Treatments / Results  DIAGNOSTIC STUDIES:  Oxygen Saturation is 96% on RA, normal by my interpretation.    COORDINATION OF CARE:  3:09 PM Pt advised to apply ice. Will discharge with anti-inflammatories.  Discussed treatment plan with pt at bedside and pt agreed to plan.  Labs (all labs ordered are listed, but only abnormal results are displayed) Labs Reviewed - No data to display  EKG  EKG Interpretation None       Radiology No results found.  Procedures Procedures (including critical care time)  Medications Ordered in ED Medications - No data to display   Initial Impression / Assessment and Plan / ED Course  I have reviewed the triage vital signs and the nursing notes.  Pertinent labs & imaging results that were available during my care of the patient were reviewed by me and considered in my medical decision making (see chart for details).    Pt comes in with cc of thumb pain. Pt's ROM over the thumb is normal, there is no deformity or swelling, and he has no pain at the thumb/wrist junction. Pt doesn't type a lot, he has no trauma. Pain shoots up  -so maybe some nerve related irritation maybe tenosynovitis - although provocative test for de quervan's were neg. Will ask pt to ice the area.   Final Clinical Impressions(s) / ED Diagnoses   Final diagnoses:  Thumb pain,  right  Impingement syndrome of wrist, right    New Prescriptions New Prescriptions   No medications on file   I personally performed the services described in this documentation, which was scribed in my presence. The recorded information has been reviewed and is accurate.     Varney Biles, MD 06/12/16 1538

## 2016-06-12 NOTE — ED Notes (Signed)
Pt unable to sign due to computer. 

## 2016-06-12 NOTE — Discharge Instructions (Signed)
Take the motrin as prescribed. Ice the thumb where it hurts for 5-10 minutes at least 4 times a day. If symptoms dont get better, consider seeing the hand doctors.

## 2016-06-12 NOTE — ED Triage Notes (Signed)
Pt presents with 2 day h/o pain to L thumb.  Pt denies any injury, denies any swelling; reports pain in wrist as well.

## 2016-06-23 ENCOUNTER — Ambulatory Visit (HOSPITAL_COMMUNITY)
Admission: EM | Admit: 2016-06-23 | Discharge: 2016-06-23 | Disposition: A | Discharge status: Home / Self Care | Payer: Self-pay | Attending: Family Medicine | Admitting: Family Medicine

## 2016-06-23 ENCOUNTER — Encounter (HOSPITAL_COMMUNITY): Payer: Self-pay | Admitting: Family Medicine

## 2016-06-23 DIAGNOSIS — M25532 Pain in left wrist: Secondary | ICD-10-CM

## 2016-06-23 MED ORDER — DICLOFENAC SODIUM 50 MG PO TBEC: 50.0000 mg | DELAYED_RELEASE_TABLET | Freq: Two times a day (BID) | ORAL | 0 refills | Status: DC

## 2016-06-23 NOTE — ED Provider Notes (Signed)
CSN: [id number]     Arrival date & time 06/23/16  1006 History   First MD Initiated Contact with Patient 06/23/16 1150     Chief Complaint  Patient presents with  . Arm Pain   (Consider location/radiation/quality/duration/timing/severity/associated sxs/prior Treatment) Patient c/o left arm and wrist pain for last 2 weeks.  He states today he is better but has been having problems yesterday.  He states he has been to the ED and has had xrays but  Still having some pains.  He reports the pain in his left wrist and it wakes him up at night occasionally.  He reports the pain is also on the ulnar side of the elbow and radiates up from the wrist.  He states he took some motrin but it didn't help.   The history is provided by the patient.  Arm Pain  This is a new problem. The current episode started yesterday. The problem occurs constantly. The problem has not changed since onset.   Past Medical History:  Diagnosis Date  . Back pain    Past Surgical History:  Procedure Laterality Date  . CHOLECYSTECTOMY  01/31/2012   Procedure: LAPAROSCOPIC CHOLECYSTECTOMY;  Surgeon: Gayland Curry, MD,FACS;  Location: [place];  Service: General;  Laterality: N/A;   History reviewed. No pertinent family history. Social History  Substance Use Topics  . Smoking status: Never Smoker  . Smokeless tobacco: Never Used  . Alcohol use Yes     Comment: occ    Review of Systems  Constitutional: Negative.   HENT: Negative.   Eyes: Negative.   Respiratory: Negative.   Cardiovascular: Negative.   Endocrine: Negative.   Genitourinary: Negative.   Musculoskeletal: Positive for arthralgias.  Allergic/Immunologic: Negative.   Neurological: Negative.   Hematological: Negative.     Allergies  Patient has no known allergies.  Home Medications   Prior to Admission medications   Medication Sig Start Date End Date Taking? Authorizing Provider  diclofenac (VOLTAREN) 50 MG EC tablet Take 1 tablet (50 mg total) by  mouth 2 (two) times daily. 06/23/16   Lysbeth Penner, FNP  hydrOXYzine (ATARAX/VISTARIL) 10 MG tablet Take 1 tablet (10 mg total) by mouth every 6 (six) hours as needed for itching. 05/28/16   Nona Dell, PA-C  ibuprofen (ADVIL,MOTRIN) 600 MG tablet Take 1 tablet (600 mg total) by mouth every 6 (six) hours as needed. For pain 06/12/16   Varney Biles, MD   Meds Ordered and Administered this Visit  Medications - No data to display  BP 117/81   Pulse 99   Temp 98.5 F (36.9 C)   Resp 18   SpO2 95%  No data found.   Physical Exam  Constitutional: He appears well-developed and well-nourished.  HENT:  Head: Normocephalic and atraumatic.  Right Ear: External ear normal.  Left Ear: External ear normal.  Mouth/Throat: Oropharynx is clear and moist.  Eyes: Conjunctivae and EOM are normal. Pupils are equal, round, and reactive to light.  Neck: Normal range of motion.  Cardiovascular: Normal rate, regular rhythm and normal heart sounds.   Pulmonary/Chest: Effort normal and breath sounds normal.  Musculoskeletal:  Negative Finklestein Negative Phalen or tinnels Normal left elbow and left wrist exam.  Nursing note and vitals reviewed.   Urgent Care Course     Procedures (including critical care time)  Labs Review Labs Reviewed - No data to display  Imaging Review No results found.   Visual Acuity Review  Right Eye Distance:  Left Eye Distance:   Bilateral Distance:    Right Eye Near:   Left Eye Near:    Bilateral Near:         MDM   1. Left wrist pain    Patient has been pointing to his left wrist as the major problem area.  He currently is symptom free.  Advised that he may have a carpal tunnel problem and should try wearing a left wrist splint and try diclofenac.  Diclofenac '50mg'$  one po bid x 10 days #20  Copies of recent xrays given to patient.  Follow up with PCP.       Lysbeth Penner, FNP 06/23/16 1213

## 2016-06-23 NOTE — ED Triage Notes (Signed)
Pt here for intermittent left arm pain for weeks. sts he has been to the ER twice about the same problem.

## 2018-03-29 ENCOUNTER — Other Ambulatory Visit: Payer: Self-pay

## 2018-03-29 ENCOUNTER — Emergency Department (HOSPITAL_COMMUNITY): Payer: Self-pay

## 2018-03-29 ENCOUNTER — Encounter (HOSPITAL_COMMUNITY): Payer: Self-pay | Admitting: *Deleted

## 2018-03-29 ENCOUNTER — Emergency Department (HOSPITAL_COMMUNITY)
Admission: EM | Admit: 2018-03-29 | Discharge: 2018-03-30 | Disposition: A | Discharge status: Home / Self Care | Payer: Self-pay | Attending: Emergency Medicine | Admitting: Emergency Medicine

## 2018-03-29 DIAGNOSIS — Z79899 Other long term (current) drug therapy: Secondary | ICD-10-CM | POA: Insufficient documentation

## 2018-03-29 DIAGNOSIS — R0781 Pleurodynia: Secondary | ICD-10-CM | POA: Insufficient documentation

## 2018-03-29 MED ORDER — LIDOCAINE 5 % EX PTCH
1.0000 | MEDICATED_PATCH | CUTANEOUS | Status: DC
  Administered 2018-03-30: 1 via TRANSDERMAL
  Filled 2018-03-29: qty 1

## 2018-03-29 NOTE — ED Triage Notes (Addendum)
Pt says that he bent over to the right over a chair with a wooden arm and started having pain in the right lower ribs. He took some ibuprofen for the same. Reports hurts to take a deep breath and pain with coughing. No acute distress.

## 2018-03-29 NOTE — Discharge Instructions (Addendum)
Please take Ibuprofen (Advil, motrin) and Tylenol (acetaminophen) to relieve your pain.  You may take up to 600 MG (3 pills) of normal strength ibuprofen every 8 hours as needed.  In between doses of ibuprofen you make take tylenol, up to 1,000 mg (two extra strength pills).  Do not take more than 3,000 mg tylenol in a 24 hour period.  Please check all medication labels as many medications such as pain and cold medications may contain tylenol.  Do not drink alcohol while taking these medications.  Do not take other NSAID'S while taking ibuprofen (such as aleve or naproxen).  Please take ibuprofen with food to decrease stomach upset.  Please take the lidocaine patch off after 12 hours and wash the skin in that area.  Do not use ice or heat while you have the lidocaine patch on.

## 2018-03-29 NOTE — ED Provider Notes (Signed)
[place] EMERGENCY DEPARTMENT Provider Note   CSN: [id number] Arrival date & time: 03/29/18  1846     History   Chief Complaint Chief Complaint  Patient presents with  . Rib Pain    HPI Jonathan King is a 52 y.o. male who presents today for evaluation of right-sided rib pain.  He reports that he was at a doctor's visit today with his roommate when he leaned over the wooden arm of a chair to reach his phone charger and had immediate onset of pain in his right sided ribs where the arm of the chair was touching his chest.  He reports that it hurts if he takes big breaths however it only hurts and that one specific area.  No left-sided or generalized chest pain.  No shortness of breath.  No fevers or chills.  No new coughing.   HPI  Past Medical History:  Diagnosis Date  . Back pain     Patient Active Problem List   Diagnosis Date Noted  . MRSA infection 05/23/2013  . Acute cholecystitis 01/30/2012    Past Surgical History:  Procedure Laterality Date  . CHOLECYSTECTOMY  01/31/2012   Procedure: LAPAROSCOPIC CHOLECYSTECTOMY;  Surgeon: Gayland Curry, MD,FACS;  Location: [place];  Service: General;  Laterality: N/A;        Home Medications    Prior to Admission medications   Medication Sig Start Date End Date Taking? Authorizing Provider  diclofenac (VOLTAREN) 50 MG EC tablet Take 1 tablet (50 mg total) by mouth 2 (two) times daily. 06/23/16   Lysbeth Penner, FNP  hydrOXYzine (ATARAX/VISTARIL) 10 MG tablet Take 1 tablet (10 mg total) by mouth every 6 (six) hours as needed for itching. 05/28/16   Nona Dell, PA-C  ibuprofen (ADVIL,MOTRIN) 600 MG tablet Take 1 tablet (600 mg total) by mouth every 6 (six) hours as needed. For pain 06/12/16   Varney Biles, MD    Family History No family history on file.  Social History Social History   Tobacco Use  . Smoking status: Never Smoker  . Smokeless tobacco: Never Used  Substance Use Topics    . Alcohol use: Yes    Comment: occ  . Drug use: No     Allergies   Patient has no known allergies.   Review of Systems Review of Systems  Constitutional: Negative for chills and fever.  HENT: Negative for congestion.   Respiratory: Negative for chest tightness and shortness of breath.   Cardiovascular:       "Rib pain"  Gastrointestinal: Negative for abdominal pain and nausea.  Musculoskeletal: Negative for back pain and neck pain.  Skin: Negative for color change, rash and wound.  Neurological: Negative for weakness and headaches.  All other systems reviewed and are negative.    Physical Exam Updated Vital Signs BP 140/73 (BP Location: Right Arm)   Pulse (!) 48   Temp 98.4 F (36.9 C) (Oral)   Resp 16   Ht 6' (1.829 m)   Wt 97.5 kg   SpO2 96%   BMI 29.16 kg/m   Physical Exam  Constitutional: He appears well-developed. No distress.  HENT:  Head: Normocephalic.  Mouth/Throat: Oropharynx is clear and moist.  Cardiovascular: Normal rate, regular rhythm and normal heart sounds.  Pulmonary/Chest: Effort normal and breath sounds normal. No stridor. No respiratory distress. He has no wheezes. He exhibits tenderness (Localized TTP over right sided lower lateral ribs.  Palpation here both recreates and exacerbates his  reported pain. ).  Abdominal: Soft. He exhibits no distension. There is no tenderness. There is no guarding.  Skin: Skin is warm and dry. He is not diaphoretic.  No obvious wounds, contusions, deformities, rash or erythema/induration over the area of pain on the right lateral chest.  Nursing note and vitals reviewed.    ED Treatments / Results  Labs (all labs ordered are listed, but only abnormal results are displayed) Labs Reviewed - No data to display  EKG None  Radiology Dg Ribs Unilateral W/chest Right  Result Date: 03/29/2018 CLINICAL DATA:  Right rib pain EXAM: RIGHT RIBS AND CHEST - 3+ VIEW COMPARISON:  02/02/2012 FINDINGS: No fracture or  other bone lesions are seen involving the ribs. There is no evidence of pneumothorax or pleural effusion. Both lungs are clear. Heart size and mediastinal contours are within normal limits. IMPRESSION: Negative. Electronically Signed   By: Rolm Baptise M.D.   On: 03/29/2018 20:29    Procedures Procedures (including critical care time)  Medications Ordered in ED Medications  lidocaine (LIDODERM) 5 % 1 patch (has no administration in time range)     Initial Impression / Assessment and Plan / ED Course  I have reviewed the triage vital signs and the nursing notes.  Pertinent labs & imaging results that were available during my care of the patient were reviewed by me and considered in my medical decision making (see chart for details).    Patient presents today for evaluation of sudden onset of right lower rib pain.  This started decently after he was leaning over a wooden chair arm to grab a phone charger, and was feeling fine before this.  Rib and chest x-rays were obtained without evidence of fracture, pneumothorax, consolidation, or other abnormalities.  Given that symptoms started immediately after he leaned over the chair arm, not consistent with ACS.  Do not suspect PE based on timing of pain after injury.  Given lidocaine patch in the department, incentive spirometer ordered.  Instructed on over-the-counter medications at home.  Instructed on proper use of incentive spirometer.  Return precautions were discussed with patient who states their understanding.  At the time of discharge patient denied any unaddressed complaints or concerns.  Patient is agreeable for discharge home.   Final Clinical Impressions(s) / ED Diagnoses   Final diagnoses:  Rib pain on right side    ED Discharge Orders    None       Ollen Gross 03/29/18 2358    Orpah Greek, MD 03/31/18 [phone]

## 2018-11-11 ENCOUNTER — Encounter (HOSPITAL_COMMUNITY): Payer: Self-pay

## 2018-11-11 ENCOUNTER — Other Ambulatory Visit: Payer: Self-pay

## 2018-11-11 ENCOUNTER — Emergency Department (HOSPITAL_COMMUNITY)
Admission: EM | Admit: 2018-11-11 | Discharge: 2018-11-12 | Disposition: A | Discharge status: Home / Self Care | Payer: Medicaid Other | Attending: Emergency Medicine | Admitting: Emergency Medicine

## 2018-11-11 DIAGNOSIS — Z79899 Other long term (current) drug therapy: Secondary | ICD-10-CM | POA: Insufficient documentation

## 2018-11-11 DIAGNOSIS — R1013 Epigastric pain: Secondary | ICD-10-CM | POA: Insufficient documentation

## 2018-11-11 LAB — TROPONIN I (HIGH SENSITIVITY): Troponin I (High Sensitivity): 29 ng/L — ABNORMAL HIGH (ref <18)

## 2018-11-11 MED ORDER — ONDANSETRON HCL 4 MG/2ML IJ SOLN
4.0000 mg | Freq: Once | INTRAMUSCULAR | Status: AC
  Administered 2018-11-11: 4 mg via INTRAVENOUS
  Filled 2018-11-11: qty 2

## 2018-11-11 MED ORDER — PANTOPRAZOLE SODIUM 40 MG PO TBEC
40.0000 mg | DELAYED_RELEASE_TABLET | Freq: Once | ORAL | Status: AC
  Administered 2018-11-11: 40 mg via ORAL
  Filled 2018-11-11: qty 1

## 2018-11-11 NOTE — ED Provider Notes (Signed)
[facility] EMERGENCY DEPARTMENT Provider Note   CSN: [id number] Arrival date & time: 11/11/18  2124    History   Chief Complaint Chief Complaint  Patient presents with  . Abdominal Pain    HPI Jonathan King is a 53 y.o. male.     HPI  This a 53 year old male with a history of cholecystitis who presents with abdominal pain.  Patient reports 2-day history of pain associated with eating.  He has reported some nausea which she states is better when he eats.  He also states he has had some vomiting that has been nonbilious and nonbloody.  Pain is the epigastric region.  He describes it as dull and nonradiating.  Currently his pain is only 2 out of 10.  He has not taken anything for his pain.  He denies any chest pain, fever, cough.  He reports normal bowel movements.  Denies any urinary symptoms.  Past Medical History:  Diagnosis Date  . Back pain     Patient Active Problem List   Diagnosis Date Noted  . MRSA infection 05/23/2013  . Acute cholecystitis 01/30/2012    Past Surgical History:  Procedure Laterality Date  . CHOLECYSTECTOMY  01/31/2012   Procedure: LAPAROSCOPIC CHOLECYSTECTOMY;  Surgeon: Gayland Curry, MD,FACS;  Location: [place];  Service: General;  Laterality: N/A;        Home Medications    Prior to Admission medications   Medication Sig Start Date End Date Taking? Authorizing Provider  ibuprofen (ADVIL,MOTRIN) 600 MG tablet Take 1 tablet (600 mg total) by mouth every 6 (six) hours as needed. For pain Patient taking differently: Take 400 mg by mouth every 6 (six) hours as needed for moderate pain.  06/12/16  Yes Varney Biles, MD  diclofenac (VOLTAREN) 50 MG EC tablet Take 1 tablet (50 mg total) by mouth 2 (two) times daily. Patient not taking: Reported on 11/12/2018 06/23/16   Lysbeth Penner, FNP  hydrOXYzine (ATARAX/VISTARIL) 10 MG tablet Take 1 tablet (10 mg total) by mouth every 6 (six) hours as needed for itching. Patient not  taking: Reported on 11/12/2018 05/28/16   Nona Dell, PA-C  omeprazole (PRILOSEC) 20 MG capsule Take 1 capsule (20 mg total) by mouth daily. 11/12/18   Horton, Barbette Hair, MD  ondansetron (ZOFRAN ODT) 4 MG disintegrating tablet Take 1 tablet (4 mg total) by mouth every 8 (eight) hours as needed for nausea or vomiting. 11/12/18   Horton, Barbette Hair, MD    Family History History reviewed. No pertinent family history.  Social History Social History   Tobacco Use  . Smoking status: Never Smoker  . Smokeless tobacco: Never Used  Substance Use Topics  . Alcohol use: Yes    Comment: occ  . Drug use: No     Allergies   Patient has no known allergies.   Review of Systems Review of Systems  Constitutional: Negative for fever.  Respiratory: Negative for cough and shortness of breath.   Cardiovascular: Negative for chest pain.  Gastrointestinal: Positive for abdominal pain, nausea and vomiting. Negative for constipation and diarrhea.  Genitourinary: Negative for dysuria.  Neurological: Negative for weakness.  All other systems reviewed and are negative.    Physical Exam Updated Vital Signs BP 127/73   Pulse (!) 46   Temp 98.3 F (36.8 C) (Oral)   Resp (!) 23   Ht 1.829 m (6')   Wt 96.2 kg   SpO2 92%   BMI 28.75 kg/m  Physical Exam Vitals signs and nursing note reviewed.  Constitutional:      Appearance: He is well-developed. He is not ill-appearing.  HENT:     Head: Normocephalic and atraumatic.  Eyes:     Pupils: Pupils are equal, round, and reactive to light.  Neck:     Musculoskeletal: Neck supple.  Cardiovascular:     Rate and Rhythm: Normal rate and regular rhythm.     Heart sounds: Normal heart sounds. No murmur.  Pulmonary:     Effort: Pulmonary effort is normal. No respiratory distress.     Breath sounds: Normal breath sounds. No wheezing.  Abdominal:     General: Bowel sounds are normal.     Palpations: Abdomen is soft.     Tenderness:  There is no abdominal tenderness. There is no guarding or rebound.     Hernia: No hernia is present.  Lymphadenopathy:     Cervical: No cervical adenopathy.  Skin:    General: Skin is warm and dry.  Neurological:     Mental Status: He is alert and oriented to person, place, and time.     Comments: Slight stutter noted  Psychiatric:        Mood and Affect: Mood normal.      ED Treatments / Results  Labs (all labs ordered are listed, but only abnormal results are displayed) Labs Reviewed  COMPREHENSIVE METABOLIC PANEL - Abnormal; Notable for the following components:      Result Value   Glucose, Bld 116 (*)    All other components within normal limits  TROPONIN I (HIGH SENSITIVITY) - Abnormal; Notable for the following components:   Troponin I (High Sensitivity) 29 (*)    All other components within normal limits  TROPONIN I (HIGH SENSITIVITY) - Abnormal; Notable for the following components:   Troponin I (High Sensitivity) 30 (*)    All other components within normal limits  CBC WITH DIFFERENTIAL/PLATELET  LIPASE, BLOOD  TROPONIN I (HIGH SENSITIVITY)    EKG EKG Interpretation  Date/Time:  Thursday November 11 2018 23:38:45 EDT Ventricular Rate:  102 PR Interval:    QRS Duration: 108 QT Interval:  360 QTC Calculation: 469 R Axis:   -26 Text Interpretation:  Sinus tachycardia Paired ventricular premature complexes Incomplete left bundle branch block Low voltage, precordial leads Anterior Q waves, possibly due to ILBBB No prior for comparison Confirmed by Thayer Jew [phone]) on 11/12/2018 1:25:11 AM   Radiology No results found.  Procedures Procedures (including critical care time)  Medications Ordered in ED Medications  pantoprazole (PROTONIX) EC tablet 40 mg (40 mg Oral Given 11/11/18 2329)  ondansetron (ZOFRAN) injection 4 mg (4 mg Intravenous Given 11/11/18 2329)     Initial Impression / Assessment and Plan / ED Course  I have reviewed the triage vital signs  and the nursing notes.  Pertinent labs & imaging results that were available during my care of the patient were reviewed by me and considered in my medical decision making (see chart for details).        Patient presents with epigastric pain.  Associated with eating.  He is overall nontoxic-appearing and vital signs are reassuring.  Pulse rate is noted to be low; however, patient has frequent PVCs on the monitor.  EKG shows no evidence of ischemia.  Low suspicion for ACS.  Troponin x2-.  Lab work largely reassuring including LFTs and lipase.  Suspect some element of gastritis or reflux.  Patient was given Protonix and Zofran.  He  is able to orally hydrate.  At this time I do not feel he needs imaging.  After history, exam, and medical workup I feel the patient has been appropriately medically screened and is safe for discharge home. Pertinent diagnoses were discussed with the patient. Patient was given return precautions.   Final Clinical Impressions(s) / ED Diagnoses   Final diagnoses:  Epigastric pain    ED Discharge Orders         Ordered    omeprazole (PRILOSEC) 20 MG capsule  Daily     11/12/18 0253    ondansetron (ZOFRAN ODT) 4 MG disintegrating tablet  Every 8 hours PRN     11/12/18 0253           Merryl Hacker, MD 11/12/18 [phone]

## 2018-11-11 NOTE — ED Notes (Signed)
Noted that pulse ox HR was picking up as 36 w/ good waveform , palpated radial pulse and noted that it did not coincide w/ rate displayed on monitor. Pt pale and diaphoretic, obtained EKG which demonstrated 2nd degree AV block, w/ 5-6 beat runs of vtach and significant ectopy. MD steinl given EKG and aware of ectopy. Pt moved to room 16 and placed on cardiac monitor

## 2018-11-11 NOTE — ED Notes (Signed)
Janett Billow (roommate): [phone]

## 2018-11-11 NOTE — ED Triage Notes (Signed)
Patient brought in by roommate with c/o of abd pain nausea and some vomiting x2 days.

## 2018-11-12 LAB — COMPREHENSIVE METABOLIC PANEL
ALT: 20 U/L (ref 0–44)
AST: 18 U/L (ref 15–41)
Albumin: 3.7 g/dL (ref 3.5–5.0)
Alkaline Phosphatase: 67 U/L (ref 38–126)
Anion gap: 9 (ref 5–15)
BUN: 18 mg/dL (ref 6–20)
CO2: 26 mmol/L (ref 22–32)
Calcium: 9.4 mg/dL (ref 8.9–10.3)
Chloride: 104 mmol/L (ref 98–111)
Creatinine, Ser: 1.11 mg/dL (ref 0.61–1.24)
GFR calc Af Amer: >60 mL/min (ref >60)
GFR calc non Af Amer: >60 mL/min (ref >60)
Glucose, Bld: 116 mg/dL — ABNORMAL HIGH (ref 70–99)
Potassium: 4.2 mmol/L (ref 3.5–5.1)
Sodium: 139 mmol/L (ref 135–145)
Total Bilirubin: 0.4 mg/dL (ref 0.3–1.2)
Total Protein: 6.9 g/dL (ref 6.5–8.1)

## 2018-11-12 LAB — CBC WITH DIFFERENTIAL/PLATELET
Abs Immature Granulocytes: 0.04 10*3/uL (ref 0.00–0.07)
Basophils Absolute: 0.1 10*3/uL (ref 0.0–0.1)
Basophils Relative: 1 %
Eosinophils Absolute: 0.3 10*3/uL (ref 0.0–0.5)
Eosinophils Relative: 3 %
HCT: 45.6 % (ref 39.0–52.0)
Hemoglobin: 14.8 g/dL (ref 13.0–17.0)
Immature Granulocytes: 0 %
Lymphocytes Relative: 20 %
Lymphs Abs: 2.1 10*3/uL (ref 0.7–4.0)
MCH: 29.9 pg (ref 26.0–34.0)
MCHC: 32.5 g/dL (ref 30.0–36.0)
MCV: 92.1 fL (ref 80.0–100.0)
Monocytes Absolute: 0.8 10*3/uL (ref 0.1–1.0)
Monocytes Relative: 7 %
Neutro Abs: 7.1 10*3/uL (ref 1.7–7.7)
Neutrophils Relative %: 69 %
Platelets: 232 10*3/uL (ref 150–400)
RBC: 4.95 MIL/uL (ref 4.22–5.81)
RDW: 13.3 % (ref 11.5–15.5)
WBC: 10.4 10*3/uL (ref 4.0–10.5)
nRBC: 0 % (ref 0.0–0.2)

## 2018-11-12 LAB — TROPONIN I (HIGH SENSITIVITY): Troponin I (High Sensitivity): 30 ng/L — ABNORMAL HIGH (ref <18)

## 2018-11-12 LAB — LIPASE, BLOOD: Lipase: 36 U/L (ref 11–51)

## 2018-11-12 MED ORDER — ONDANSETRON 4 MG PO TBDP: 4.0000 mg | ORAL_TABLET | Freq: Three times a day (TID) | ORAL | 0 refills | Status: DC | PRN

## 2018-11-12 MED ORDER — OMEPRAZOLE 20 MG PO CPDR: 20.0000 mg | DELAYED_RELEASE_CAPSULE | Freq: Every day | ORAL | 0 refills | Status: DC

## 2018-11-12 NOTE — Discharge Instructions (Addendum)
You were seen today for abdominal pain.  Your work-up is reassuring.  Take medications as prescribed.  Follow-up with GI if symptoms persist.

## 2018-12-09 ENCOUNTER — Other Ambulatory Visit: Payer: Self-pay

## 2018-12-09 ENCOUNTER — Encounter (HOSPITAL_COMMUNITY): Payer: Self-pay | Admitting: Emergency Medicine

## 2018-12-09 ENCOUNTER — Inpatient Hospital Stay (HOSPITAL_COMMUNITY)
Admission: EM | Admit: 2018-12-09 | Discharge: 2018-12-30 | DRG: 233 | Disposition: A | Discharge status: Home / Self Care | Payer: Self-pay | Attending: Cardiothoracic Surgery | Admitting: Cardiothoracic Surgery

## 2018-12-09 DIAGNOSIS — R7401 Elevation of levels of liver transaminase levels: Secondary | ICD-10-CM

## 2018-12-09 DIAGNOSIS — R778 Other specified abnormalities of plasma proteins: Secondary | ICD-10-CM

## 2018-12-09 DIAGNOSIS — J939 Pneumothorax, unspecified: Secondary | ICD-10-CM

## 2018-12-09 DIAGNOSIS — E878 Other disorders of electrolyte and fluid balance, not elsewhere classified: Secondary | ICD-10-CM

## 2018-12-09 DIAGNOSIS — F1010 Alcohol abuse, uncomplicated: Secondary | ICD-10-CM | POA: Diagnosis present

## 2018-12-09 DIAGNOSIS — I493 Ventricular premature depolarization: Secondary | ICD-10-CM | POA: Diagnosis present

## 2018-12-09 DIAGNOSIS — K219 Gastro-esophageal reflux disease without esophagitis: Secondary | ICD-10-CM

## 2018-12-09 DIAGNOSIS — Z79899 Other long term (current) drug therapy: Secondary | ICD-10-CM

## 2018-12-09 DIAGNOSIS — R059 Cough, unspecified: Secondary | ICD-10-CM

## 2018-12-09 DIAGNOSIS — D72829 Elevated white blood cell count, unspecified: Secondary | ICD-10-CM

## 2018-12-09 DIAGNOSIS — I459 Conduction disorder, unspecified: Secondary | ICD-10-CM | POA: Diagnosis present

## 2018-12-09 DIAGNOSIS — I5022 Chronic systolic (congestive) heart failure: Secondary | ICD-10-CM | POA: Insufficient documentation

## 2018-12-09 DIAGNOSIS — I4729 Other ventricular tachycardia: Secondary | ICD-10-CM

## 2018-12-09 DIAGNOSIS — Z820 Family history of epilepsy and other diseases of the nervous system: Secondary | ICD-10-CM

## 2018-12-09 DIAGNOSIS — Z791 Long term (current) use of non-steroidal anti-inflammatories (NSAID): Secondary | ICD-10-CM

## 2018-12-09 DIAGNOSIS — R7989 Other specified abnormal findings of blood chemistry: Secondary | ICD-10-CM

## 2018-12-09 DIAGNOSIS — I080 Rheumatic disorders of both mitral and aortic valves: Secondary | ICD-10-CM | POA: Diagnosis present

## 2018-12-09 DIAGNOSIS — Z9049 Acquired absence of other specified parts of digestive tract: Secondary | ICD-10-CM

## 2018-12-09 DIAGNOSIS — R079 Chest pain, unspecified: Secondary | ICD-10-CM

## 2018-12-09 DIAGNOSIS — I9581 Postprocedural hypotension: Secondary | ICD-10-CM | POA: Diagnosis present

## 2018-12-09 DIAGNOSIS — J690 Pneumonitis due to inhalation of food and vomit: Secondary | ICD-10-CM

## 2018-12-09 DIAGNOSIS — D62 Acute posthemorrhagic anemia: Secondary | ICD-10-CM | POA: Diagnosis present

## 2018-12-09 DIAGNOSIS — I5023 Acute on chronic systolic (congestive) heart failure: Secondary | ICD-10-CM

## 2018-12-09 DIAGNOSIS — R7303 Prediabetes: Secondary | ICD-10-CM

## 2018-12-09 DIAGNOSIS — Z09 Encounter for follow-up examination after completed treatment for conditions other than malignant neoplasm: Secondary | ICD-10-CM

## 2018-12-09 DIAGNOSIS — I259 Chronic ischemic heart disease, unspecified: Secondary | ICD-10-CM | POA: Diagnosis present

## 2018-12-09 DIAGNOSIS — R911 Solitary pulmonary nodule: Secondary | ICD-10-CM

## 2018-12-09 DIAGNOSIS — I420 Dilated cardiomyopathy: Secondary | ICD-10-CM | POA: Diagnosis present

## 2018-12-09 DIAGNOSIS — E669 Obesity, unspecified: Secondary | ICD-10-CM | POA: Diagnosis present

## 2018-12-09 DIAGNOSIS — R05 Cough: Secondary | ICD-10-CM

## 2018-12-09 DIAGNOSIS — C3490 Malignant neoplasm of unspecified part of unspecified bronchus or lung: Secondary | ICD-10-CM | POA: Diagnosis present

## 2018-12-09 DIAGNOSIS — E876 Hypokalemia: Secondary | ICD-10-CM

## 2018-12-09 DIAGNOSIS — K5900 Constipation, unspecified: Secondary | ICD-10-CM | POA: Diagnosis present

## 2018-12-09 DIAGNOSIS — R739 Hyperglycemia, unspecified: Secondary | ICD-10-CM | POA: Diagnosis present

## 2018-12-09 DIAGNOSIS — I2510 Atherosclerotic heart disease of native coronary artery without angina pectoris: Secondary | ICD-10-CM | POA: Diagnosis present

## 2018-12-09 DIAGNOSIS — I480 Paroxysmal atrial fibrillation: Secondary | ICD-10-CM | POA: Diagnosis not present

## 2018-12-09 DIAGNOSIS — E785 Hyperlipidemia, unspecified: Secondary | ICD-10-CM | POA: Diagnosis present

## 2018-12-09 DIAGNOSIS — I313 Pericardial effusion (noninflammatory): Principal | ICD-10-CM | POA: Diagnosis present

## 2018-12-09 DIAGNOSIS — D696 Thrombocytopenia, unspecified: Secondary | ICD-10-CM | POA: Diagnosis present

## 2018-12-09 DIAGNOSIS — I509 Heart failure, unspecified: Secondary | ICD-10-CM

## 2018-12-09 DIAGNOSIS — T502X5A Adverse effect of carbonic-anhydrase inhibitors, benzothiadiazides and other diuretics, initial encounter: Secondary | ICD-10-CM | POA: Diagnosis present

## 2018-12-09 DIAGNOSIS — R918 Other nonspecific abnormal finding of lung field: Secondary | ICD-10-CM

## 2018-12-09 DIAGNOSIS — J9382 Other air leak: Secondary | ICD-10-CM | POA: Diagnosis not present

## 2018-12-09 DIAGNOSIS — M545 Low back pain: Secondary | ICD-10-CM | POA: Diagnosis present

## 2018-12-09 DIAGNOSIS — I2582 Chronic total occlusion of coronary artery: Secondary | ICD-10-CM | POA: Diagnosis present

## 2018-12-09 DIAGNOSIS — Z951 Presence of aortocoronary bypass graft: Secondary | ICD-10-CM

## 2018-12-09 DIAGNOSIS — N179 Acute kidney failure, unspecified: Secondary | ICD-10-CM | POA: Diagnosis present

## 2018-12-09 DIAGNOSIS — I255 Ischemic cardiomyopathy: Secondary | ICD-10-CM | POA: Diagnosis present

## 2018-12-09 DIAGNOSIS — I5021 Acute systolic (congestive) heart failure: Secondary | ICD-10-CM | POA: Insufficient documentation

## 2018-12-09 DIAGNOSIS — I472 Ventricular tachycardia: Secondary | ICD-10-CM | POA: Diagnosis not present

## 2018-12-09 DIAGNOSIS — Z20828 Contact with and (suspected) exposure to other viral communicable diseases: Secondary | ICD-10-CM | POA: Diagnosis present

## 2018-12-09 DIAGNOSIS — I5041 Acute combined systolic (congestive) and diastolic (congestive) heart failure: Secondary | ICD-10-CM | POA: Diagnosis present

## 2018-12-09 DIAGNOSIS — R0602 Shortness of breath: Secondary | ICD-10-CM

## 2018-12-09 DIAGNOSIS — E871 Hypo-osmolality and hyponatremia: Secondary | ICD-10-CM

## 2018-12-09 DIAGNOSIS — I5043 Acute on chronic combined systolic (congestive) and diastolic (congestive) heart failure: Secondary | ICD-10-CM | POA: Diagnosis present

## 2018-12-09 DIAGNOSIS — Z7982 Long term (current) use of aspirin: Secondary | ICD-10-CM

## 2018-12-09 LAB — COMPREHENSIVE METABOLIC PANEL
ALT: 32 U/L (ref 0–44)
AST: 23 U/L (ref 15–41)
Albumin: 3.8 g/dL (ref 3.5–5.0)
Alkaline Phosphatase: 77 U/L (ref 38–126)
Anion gap: 11 (ref 5–15)
BUN: 20 mg/dL (ref 6–20)
CO2: 23 mmol/L (ref 22–32)
Calcium: 9 mg/dL (ref 8.9–10.3)
Chloride: 105 mmol/L (ref 98–111)
Creatinine, Ser: 1.2 mg/dL (ref 0.61–1.24)
GFR calc Af Amer: >60 mL/min (ref >60)
GFR calc non Af Amer: >60 mL/min (ref >60)
Glucose, Bld: 126 mg/dL — ABNORMAL HIGH (ref 70–99)
Potassium: 4 mmol/L (ref 3.5–5.1)
Sodium: 139 mmol/L (ref 135–145)
Total Bilirubin: 0.7 mg/dL (ref 0.3–1.2)
Total Protein: 7.4 g/dL (ref 6.5–8.1)

## 2018-12-09 LAB — CBC
HCT: 47.3 % (ref 39.0–52.0)
Hemoglobin: 15 g/dL (ref 13.0–17.0)
MCH: 30 pg (ref 26.0–34.0)
MCHC: 31.7 g/dL (ref 30.0–36.0)
MCV: 94.6 fL (ref 80.0–100.0)
Platelets: 241 10*3/uL (ref 150–400)
RBC: 5 MIL/uL (ref 4.22–5.81)
RDW: 13.3 % (ref 11.5–15.5)
WBC: 8.1 10*3/uL (ref 4.0–10.5)
nRBC: 0 % (ref 0.0–0.2)

## 2018-12-09 LAB — LIPASE, BLOOD: Lipase: 31 U/L (ref 11–51)

## 2018-12-09 MED ORDER — SODIUM CHLORIDE 0.9% FLUSH
3.0000 mL | Freq: Once | INTRAVENOUS | Status: AC
  Administered 2018-12-10: 3 mL via INTRAVENOUS

## 2018-12-09 NOTE — ED Triage Notes (Signed)
Pt reports genralized abdominal pain x 34month.Pt reports SHOB, cough. Denies N/V, sick contact or fever.

## 2018-12-10 ENCOUNTER — Encounter (HOSPITAL_COMMUNITY): Payer: Self-pay | Admitting: Family Medicine

## 2018-12-10 ENCOUNTER — Emergency Department (HOSPITAL_COMMUNITY): Payer: Self-pay

## 2018-12-10 DIAGNOSIS — I509 Heart failure, unspecified: Secondary | ICD-10-CM

## 2018-12-10 DIAGNOSIS — I5022 Chronic systolic (congestive) heart failure: Secondary | ICD-10-CM | POA: Insufficient documentation

## 2018-12-10 DIAGNOSIS — I5021 Acute systolic (congestive) heart failure: Secondary | ICD-10-CM | POA: Insufficient documentation

## 2018-12-10 DIAGNOSIS — R739 Hyperglycemia, unspecified: Secondary | ICD-10-CM

## 2018-12-10 LAB — COMPREHENSIVE METABOLIC PANEL
ALT: 29 U/L (ref 0–44)
AST: 21 U/L (ref 15–41)
Albumin: 3.7 g/dL (ref 3.5–5.0)
Alkaline Phosphatase: 76 U/L (ref 38–126)
Anion gap: 9 (ref 5–15)
BUN: 16 mg/dL (ref 6–20)
CO2: 29 mmol/L (ref 22–32)
Calcium: 8.9 mg/dL (ref 8.9–10.3)
Chloride: 99 mmol/L (ref 98–111)
Creatinine, Ser: 1.16 mg/dL (ref 0.61–1.24)
GFR calc Af Amer: >60 mL/min (ref >60)
GFR calc non Af Amer: >60 mL/min (ref >60)
Glucose, Bld: 116 mg/dL — ABNORMAL HIGH (ref 70–99)
Potassium: 3.9 mmol/L (ref 3.5–5.1)
Sodium: 137 mmol/L (ref 135–145)
Total Bilirubin: 0.7 mg/dL (ref 0.3–1.2)
Total Protein: 7.2 g/dL (ref 6.5–8.1)

## 2018-12-10 LAB — BASIC METABOLIC PANEL
Anion gap: 13 (ref 5–15)
BUN: 15 mg/dL (ref 6–20)
CO2: 28 mmol/L (ref 22–32)
Calcium: 9.5 mg/dL (ref 8.9–10.3)
Chloride: 97 mmol/L — ABNORMAL LOW (ref 98–111)
Creatinine, Ser: 1.22 mg/dL (ref 0.61–1.24)
GFR calc Af Amer: >60 mL/min (ref >60)
GFR calc non Af Amer: >60 mL/min (ref >60)
Glucose, Bld: 114 mg/dL — ABNORMAL HIGH (ref 70–99)
Potassium: 3.7 mmol/L (ref 3.5–5.1)
Sodium: 138 mmol/L (ref 135–145)

## 2018-12-10 LAB — CBC WITH DIFFERENTIAL/PLATELET
Abs Immature Granulocytes: 0.02 10*3/uL (ref 0.00–0.07)
Basophils Absolute: 0.1 10*3/uL (ref 0.0–0.1)
Basophils Relative: 1 %
Eosinophils Absolute: 0.3 10*3/uL (ref 0.0–0.5)
Eosinophils Relative: 3 %
HCT: 45.9 % (ref 39.0–52.0)
Hemoglobin: 14.6 g/dL (ref 13.0–17.0)
Immature Granulocytes: 0 %
Lymphocytes Relative: 21 %
Lymphs Abs: 2.1 10*3/uL (ref 0.7–4.0)
MCH: 29.7 pg (ref 26.0–34.0)
MCHC: 31.8 g/dL (ref 30.0–36.0)
MCV: 93.3 fL (ref 80.0–100.0)
Monocytes Absolute: 0.7 10*3/uL (ref 0.1–1.0)
Monocytes Relative: 7 %
Neutro Abs: 6.7 10*3/uL (ref 1.7–7.7)
Neutrophils Relative %: 68 %
Platelets: 220 10*3/uL (ref 150–400)
RBC: 4.92 MIL/uL (ref 4.22–5.81)
RDW: 13.2 % (ref 11.5–15.5)
WBC: 9.8 10*3/uL (ref 4.0–10.5)
nRBC: 0 % (ref 0.0–0.2)

## 2018-12-10 LAB — SARS CORONAVIRUS 2 (TAT 6-24 HRS): SARS Coronavirus 2: NEGATIVE

## 2018-12-10 LAB — MAGNESIUM
Magnesium: 2.1 mg/dL (ref 1.7–2.4)
Magnesium: 2.1 mg/dL (ref 1.7–2.4)

## 2018-12-10 LAB — URINALYSIS, ROUTINE W REFLEX MICROSCOPIC
Bilirubin Urine: NEGATIVE
Glucose, UA: NEGATIVE mg/dL
Hgb urine dipstick: NEGATIVE
Ketones, ur: NEGATIVE mg/dL
Leukocytes,Ua: NEGATIVE
Nitrite: NEGATIVE
Protein, ur: NEGATIVE mg/dL
Specific Gravity, Urine: 1.023 (ref 1.005–1.030)
pH: 5 (ref 5.0–8.0)

## 2018-12-10 LAB — TROPONIN I (HIGH SENSITIVITY)
Troponin I (High Sensitivity): 42 ng/L — ABNORMAL HIGH (ref <18)
Troponin I (High Sensitivity): 46 ng/L — ABNORMAL HIGH (ref <18)
Troponin I (High Sensitivity): 41 ng/L — ABNORMAL HIGH (ref <18)
Troponin I (High Sensitivity): 41 ng/L — ABNORMAL HIGH (ref <18)

## 2018-12-10 LAB — PHOSPHORUS: Phosphorus: 3.1 mg/dL (ref 2.5–4.6)

## 2018-12-10 LAB — BRAIN NATRIURETIC PEPTIDE: B Natriuretic Peptide: 1200.8 pg/mL — ABNORMAL HIGH (ref 0.0–100.0)

## 2018-12-10 LAB — MRSA PCR SCREENING: MRSA by PCR: NEGATIVE

## 2018-12-10 MED ORDER — SODIUM CHLORIDE 0.9 % IV SOLN
250.0000 mL | INTRAVENOUS | Status: DC | PRN
  Administered 2018-12-14: 250 mL via INTRAVENOUS

## 2018-12-10 MED ORDER — ACETAMINOPHEN 325 MG PO TABS
650.0000 mg | ORAL_TABLET | ORAL | Status: DC | PRN
  Administered 2018-12-13: 650 mg via ORAL
  Filled 2018-12-10: qty 2

## 2018-12-10 MED ORDER — GUAIFENESIN ER 600 MG PO TB12
1200.0000 mg | ORAL_TABLET | Freq: Two times a day (BID) | ORAL | Status: DC
  Administered 2018-12-10 – 2018-12-20 (×21): 1200 mg via ORAL
  Filled 2018-12-10 (×21): qty 2

## 2018-12-10 MED ORDER — BENZONATATE 100 MG PO CAPS
200.0000 mg | ORAL_CAPSULE | Freq: Three times a day (TID) | ORAL | Status: DC | PRN
  Administered 2018-12-13 – 2018-12-17 (×2): 200 mg via ORAL
  Filled 2018-12-10 (×2): qty 2

## 2018-12-10 MED ORDER — ENOXAPARIN SODIUM 40 MG/0.4ML ~~LOC~~ SOLN
40.0000 mg | SUBCUTANEOUS | Status: DC
  Administered 2018-12-10 – 2018-12-16 (×7): 40 mg via SUBCUTANEOUS
  Filled 2018-12-10 (×8): qty 0.4

## 2018-12-10 MED ORDER — ONDANSETRON HCL 4 MG/2ML IJ SOLN: 4.0000 mg | Freq: Four times a day (QID) | INTRAMUSCULAR | Status: DC | PRN

## 2018-12-10 MED ORDER — PANTOPRAZOLE SODIUM 40 MG PO TBEC
40.0000 mg | DELAYED_RELEASE_TABLET | Freq: Every day | ORAL | Status: DC
  Administered 2018-12-10 – 2018-12-20 (×11): 40 mg via ORAL
  Filled 2018-12-10 (×11): qty 1

## 2018-12-10 MED ORDER — SODIUM CHLORIDE 0.9% FLUSH
3.0000 mL | Freq: Two times a day (BID) | INTRAVENOUS | Status: DC
  Administered 2018-12-10 – 2018-12-19 (×13): 3 mL via INTRAVENOUS

## 2018-12-10 MED ORDER — SODIUM CHLORIDE 0.9% FLUSH
3.0000 mL | INTRAVENOUS | Status: DC | PRN
  Administered 2018-12-16: 3 mL via INTRAVENOUS
  Filled 2018-12-10: qty 3

## 2018-12-10 MED ORDER — FUROSEMIDE 10 MG/ML IJ SOLN
40.0000 mg | Freq: Two times a day (BID) | INTRAMUSCULAR | Status: DC
  Administered 2018-12-10 – 2018-12-11 (×3): 40 mg via INTRAVENOUS
  Filled 2018-12-10 (×3): qty 4

## 2018-12-10 NOTE — Progress Notes (Signed)
The patient was admitted early this morning after midnight and H&P has been reviewed and I am in current agreement with assessment and plan done by Dr. Mitzi Hansen.  Additional changes the plan of care been made accordingly.  The patient is a 53 year old Caucasian obese male with no real appreciable past medical history except history of intermittent alcohol abuse who presents with a chief complaint of grossly worsening shortness of breath, nonproductive cough as well as abdominal discomfort and swelling.  He states that he has had trouble laying flat at night and tosses and turns but denies any lower extremity swelling.  He states that symptoms started about a month ago insidiously and have progressively worsened.  Also noted to have some mild abdominal discomfort and nausea over the same interval but was recently prescribed Protonix and Zofran and states that this is alleviated his abdominal discomfort.  He presented to the ED and was found to be afebrile and saturating 90% on room air while at rest but was slightly tachypneic.  Chest x-ray was notable for increased cardiac silhouette from prior and BNP was elevated at 1200.8.  Of note SARS-CoV-2 testing was still pending.  CHF of unknown type given his persistently worsening shortness of breath with orthopnea and elevated BNP along with increased cardiac silhouette.  He was started on diuresis and echocardiogram ordered.  He will be admitted for the following and treated for but not limited to:  Suspected CHF  -Presents with insidiously worsening SOB with orthopnea, no swelling, no fever, and no chest pain  - BNP is elevated to 1201 and cardiac silhouette on CXR is increased from prior  -Start Diureses with IV Lasix 40 mg BID -Check echocardiogram,  -Continue to monitor renal function and electrolytes during diuresis -Consider ACE and beta-blocker if echo confirms CHF  -Strict I's and O's and daily weights and fluid restrict to 1500 mL's -Cycle cardiac  troponin and high-sensitivity to rule out ischemia as a cause for suspected CHF -Continue monitor volume status carefully and if necessary will need to consult cardiology for further evaluation and management  Hyperglycemia -Patient's blood sugar on admission was 116 and repeat was 126 -Check hemoglobin A1c -Continue to monitor and trend blood sugars carefully and if necessary will need to add sensitive NovoLog sliding scale insulin AC  ? Pericardial Effusion -We will obtain echocardiogram to delineate and evaluate  We will continue to monitor patient's clinical response to intervention and repeat blood work in a.m.

## 2018-12-10 NOTE — ED Provider Notes (Signed)
ED ECG REPORT   Date: 12/10/2018 0142  Rate: 83  Rhythm: normal sinus rhythm  QRS Axis: left  Intervals: normal  ST/T Wave abnormalities: nonspecific ST changes  Conduction Disutrbances:none  Narrative Interpretation: PVCs noted    I have personally reviewed the EKG tracing and agree with the computerized printout as noted.    Ripley Fraise, MD 12/10/18 [phone]

## 2018-12-10 NOTE — Plan of Care (Signed)
Patient is currently diuresing, receiving IV lasix for acute overload. Patient is a new CHF patient. Patient is independent on room air.

## 2018-12-10 NOTE — Progress Notes (Signed)
Attempted to call ED Nurse back to receive report.

## 2018-12-10 NOTE — H&P (Signed)
History and Physical    Jonathan King:[id number] DOB: August 20, 1965 DOA: 12/09/2018  PCP: Patient, No Pcp Per   Patient coming from: Home   Chief Complaint: Orthopnea, SOB, swelling, abd discomfort   HPI: Jonathan King is a 53 y.o. male who denies any significant past medical history now presents for evaluation of progressive dyspnea, nonproductive cough. He has had some orthopnea associated with this but denies any lower extremity swelling.  Symptoms began insidiously roughly at least a month ago and have progressively worsened.  He has also been having some mild abdominal discomfort and nausea over the same interval, but was recently prescribed Protonix and Zofran and states that that is not bothering him now.  He denies any fevers, chills, chest pain, or sick contacts.  ED Course: Upon arrival to the ED, patient is found to be afebrile, saturating 90% on room air while at rest, slightly tachypneic, and with stable blood pressure and heart rate.  EKG features a sinus rhythm with PVCs and nonspecific ST abnormality.  Chest x-ray is notable for an increased cardiac silhouette from prior.  Chemistry panel unremarkable.  CBC unremarkable.  Troponin mildly elevated at 42.  BNP is elevated to 1201.  Hospitalist were asked to admit.  Review of Systems:  All other systems reviewed and apart from HPI, are negative.  Past Medical History:  Diagnosis Date  . Back pain     Past Surgical History:  Procedure Laterality Date  . CHOLECYSTECTOMY  01/31/2012   Procedure: LAPAROSCOPIC CHOLECYSTECTOMY;  Surgeon: Gayland Curry, MD,FACS;  Location: [place];  Service: General;  Laterality: N/A;     reports that he has never smoked. He has never used smokeless tobacco. He reports current alcohol use. He reports that he does not use drugs.  No Known Allergies  History reviewed. No pertinent family history.   Prior to Admission medications   Medication Sig Start Date End Date Taking? Authorizing Provider   diclofenac (VOLTAREN) 50 MG EC tablet Take 1 tablet (50 mg total) by mouth 2 (two) times daily. Patient not taking: Reported on 11/12/2018 06/23/16   Lysbeth Penner, FNP  hydrOXYzine (ATARAX/VISTARIL) 10 MG tablet Take 1 tablet (10 mg total) by mouth every 6 (six) hours as needed for itching. Patient not taking: Reported on 11/12/2018 05/28/16   Nona Dell, PA-C  ibuprofen (ADVIL,MOTRIN) 600 MG tablet Take 1 tablet (600 mg total) by mouth every 6 (six) hours as needed. For pain Patient taking differently: Take 400 mg by mouth every 6 (six) hours as needed for moderate pain.  06/12/16   Varney Biles, MD  omeprazole (PRILOSEC) 20 MG capsule Take 1 capsule (20 mg total) by mouth daily. 11/12/18   Horton, Barbette Hair, MD  ondansetron (ZOFRAN ODT) 4 MG disintegrating tablet Take 1 tablet (4 mg total) by mouth every 8 (eight) hours as needed for nausea or vomiting. 11/12/18   Merryl Hacker, MD    Physical Exam: Vitals:   12/10/18 0300 12/10/18 0330 12/10/18 0345 12/10/18 0415  BP: 107/80 101/87 123/73 117/76  Pulse: (!) 31 98 66 80  Resp: (!) 23 (!) 25 15 15   Temp:      TempSrc:      SpO2: 93% 93% 94% 90%    Constitutional: NAD, calm  Eyes: PERTLA, hordeolum upper lid on left, conjunctivae normal ENMT: Mucous membranes are moist. Posterior pharynx clear of any exudate or lesions.   Neck: normal, supple, no masses, no thyromegaly Respiratory: no wheezing, no  crackles. Tachypneic. No accessory muscle use.  Cardiovascular: S1 & S2 heard, regular rate and rhythm. No extremity edema.   Abdomen: No distension, no tenderness, soft. Bowel sounds active.  Musculoskeletal: no clubbing / cyanosis. No joint deformity upper and lower extremities.    Skin: no significant rashes, lesions, ulcers. Warm, dry, well-perfused. Neurologic: CN 2-12 grossly intact. Sensation intact. Strength 5/5 in all 4 limbs.  Psychiatric: Alert and oriented x 3. Calm, cooperative.    Labs on Admission: I  have personally reviewed following labs and imaging studies  CBC: Recent Labs  Lab 12/09/18 1947  WBC 8.1  HGB 15.0  HCT 47.3  MCV 94.6  PLT 161   Basic Metabolic Panel: Recent Labs  Lab 12/09/18 1947  NA 139  K 4.0  CL 105  CO2 23  GLUCOSE 126*  BUN 20  CREATININE 1.20  CALCIUM 9.0   GFR: CrCl cannot be calculated (Unknown ideal weight.). Liver Function Tests: Recent Labs  Lab 12/09/18 1947  AST 23  ALT 32  ALKPHOS 77  BILITOT 0.7  PROT 7.4  ALBUMIN 3.8   Recent Labs  Lab 12/09/18 1947  LIPASE 31   No results for input(s): AMMONIA in the last 168 hours. Coagulation Profile: No results for input(s): INR, PROTIME in the last 168 hours. Cardiac Enzymes: No results for input(s): CKTOTAL, CKMB, CKMBINDEX, TROPONINI in the last 168 hours. BNP (last 3 results) No results for input(s): PROBNP in the last 8760 hours. HbA1C: No results for input(s): HGBA1C in the last 72 hours. CBG: No results for input(s): GLUCAP in the last 168 hours. Lipid Profile: No results for input(s): CHOL, HDL, LDLCALC, TRIG, CHOLHDL, LDLDIRECT in the last 72 hours. Thyroid Function Tests: No results for input(s): TSH, T4TOTAL, FREET4, T3FREE, THYROIDAB in the last 72 hours. Anemia Panel: No results for input(s): VITAMINB12, FOLATE, FERRITIN, TIBC, IRON, RETICCTPCT in the last 72 hours. Urine analysis:    Component Value Date/Time   COLORURINE YELLOW 12/09/2018 2349   APPEARANCEUR CLEAR 12/09/2018 2349   LABSPEC 1.023 12/09/2018 2349   PHURINE 5.0 12/09/2018 2349   GLUCOSEU NEGATIVE 12/09/2018 2349   HGBUR NEGATIVE 12/09/2018 2349   BILIRUBINUR NEGATIVE 12/09/2018 2349   KETONESUR NEGATIVE 12/09/2018 2349   PROTEINUR NEGATIVE 12/09/2018 2349   UROBILINOGEN 2.0 (H) 01/30/2012 1606   NITRITE NEGATIVE 12/09/2018 2349   LEUKOCYTESUR NEGATIVE 12/09/2018 2349   Sepsis Labs: @LABRCNTIP (procalcitonin:4,lacticidven:4) )No results found for this or any previous visit (from the past  240 hour(s)).   Radiological Exams on Admission: Dg Chest Port 1 View  Result Date: 12/10/2018 CLINICAL DATA:  53 year old male with cough and abdominal pain. EXAM: PORTABLE CHEST 1 VIEW COMPARISON:  03/29/2018 and earlier. FINDINGS: Portable AP upright view at 0224 hours. Increased cardiac silhouette since 2019. Other mediastinal contours are within normal limits. Visualized tracheal air column is within normal limits. Stable lung volumes. Allowing for portable technique the lungs are clear. No pneumothorax. Negative visible bowel gas pattern. No acute osseous abnormality identified. IMPRESSION: 1. Suggestion of increased cardiac silhouette since 2019. Consider pericardial effusion. 2. Otherwise no acute cardiopulmonary abnormality. Electronically Signed   By: Genevie Ann M.D.   On: 12/10/2018 02:52    EKG: Independently reviewed. Sinus rhythm, PVC's, non-specific ST abnormalities.   Assessment/Plan   1. Suspected CHF  - Presents with insidiously worsening SOB with orthopnea, no swelling, no fever, and no chest pain  - BNP is elevated to 1201 and cardiac silhouette on CXR is increased from prior  -  Diurese with IV Lasix, check echocardiogram, monitor renal function and electrolytes during diuresis, and consider ACE and beta-blocker if echo confirms CHF    PPE: Mask, face shield  DVT prophylaxis: Lovenox  Code Status: Full  Family Communication: Discussed with patient  Consults called: None  Admission status: Observation     Vianne Bulls, MD [facility] Pager [phone]  If 7PM-7AM, please contact night-coverage [email or web address] Password [email or web address]  12/10/2018, 5:01 AM

## 2018-12-10 NOTE — ED Notes (Signed)
Breakfast tray ordered 

## 2018-12-10 NOTE — ED Provider Notes (Signed)
[facility] EMERGENCY DEPARTMENT Provider Note   CSN: [id number] Arrival date & time: 12/09/18  1933    History   Chief Complaint Chief Complaint  Patient presents with  . Shortness of Breath    HPI Jonathan King is a 53 y.o. male with a hx of cholecystectomy, back pain presents to the Emergency Department complaining of gradual, persistent dry cough onset onset 3 mos ago. Pt reports he developed associated shortness of breath several weeks ago.  Pt reports this is worse at night when laying flat on his back.  He denies dyspnea on exertion.  Pt reports he is a never smoker.  Nothing makes the SOB or cough better or worse.  Pt denies taking any medications including cough treatments or ongoing ACE inhibitors.  Pt denies fever, chills, headache, neck pain, chest pain, weakness, dizziness, syncope.  Pt also c/o epigastric abd pain rated at a 2/10 without radiation.  Pt reports the pain has been present since his last ED visit but has improved significantly.  Pt reports he was placed on omeprazole which helped his symptoms, but turned his "poop green."  He therefore stopped taking the medication.  Pt denies aggravation with food.  Pt denies N/V/D.  He reports normal appetite.  Nothing makes his symptoms better or worse.  Pt reports he used to drink alcohol, but drinks seldomly at this time.  Pt denies known sick of [place] contacts.       The history is provided by the patient and medical records. No language interpreter was used.    Past Medical History:  Diagnosis Date  . Back pain     Patient Active Problem List   Diagnosis Date Noted  . Acute CHF (congestive heart failure) ([place]) 12/10/2018  . MRSA infection 05/23/2013  . Acute cholecystitis 01/30/2012    Past Surgical History:  Procedure Laterality Date  . CHOLECYSTECTOMY  01/31/2012   Procedure: LAPAROSCOPIC CHOLECYSTECTOMY;  Surgeon: Gayland Curry, MD,FACS;  Location: [facility];  Service: General;  Laterality:  N/A;        Home Medications    Prior to Admission medications   Medication Sig Start Date End Date Taking? Authorizing Provider  ibuprofen (ADVIL,MOTRIN) 600 MG tablet Take 1 tablet (600 mg total) by mouth every 6 (six) hours as needed. For pain Patient taking differently: Take 400 mg by mouth every 6 (six) hours as needed for moderate pain.  06/12/16  Yes Varney Biles, MD  omeprazole (PRILOSEC) 20 MG capsule Take 1 capsule (20 mg total) by mouth daily. 11/12/18  Yes Horton, Barbette Hair, MD  ondansetron (ZOFRAN ODT) 4 MG disintegrating tablet Take 1 tablet (4 mg total) by mouth every 8 (eight) hours as needed for nausea or vomiting. 11/12/18  Yes Horton, Barbette Hair, MD    Family History History reviewed. No pertinent family history.  Social History Social History   Tobacco Use  . Smoking status: Never Smoker  . Smokeless tobacco: Never Used  Substance Use Topics  . Alcohol use: Yes    Comment: occ  . Drug use: No     Allergies   Patient has no known allergies.   Review of Systems Review of Systems  Constitutional: Negative for appetite change, diaphoresis, fatigue, fever and unexpected weight change.  HENT: Negative for mouth sores.   Eyes: Negative for visual disturbance.  Respiratory: Positive for cough and shortness of breath. Negative for chest tightness and wheezing.   Cardiovascular: Negative for chest pain.  Gastrointestinal: Negative  for abdominal pain, constipation, diarrhea, nausea and vomiting.  Endocrine: Negative for polydipsia, polyphagia and polyuria.  Genitourinary: Negative for dysuria, frequency, hematuria and urgency.  Musculoskeletal: Negative for back pain and neck stiffness.  Skin: Negative for rash.  Allergic/Immunologic: Negative for immunocompromised state.  Neurological: Negative for syncope, light-headedness and headaches.  Hematological: Does not bruise/bleed easily.  Psychiatric/Behavioral: Negative for sleep disturbance. The patient is  not nervous/anxious.      Physical Exam Updated Vital Signs BP 116/85   Pulse 94   Temp 98.7 F (37.1 C) (Oral)   Resp 18   SpO2 95%   Physical Exam Vitals signs and nursing note reviewed.  Constitutional:      General: He is not in acute distress.    Appearance: He is not diaphoretic.  HENT:     Head: Normocephalic.  Eyes:     General: No scleral icterus.    Conjunctiva/sclera: Conjunctivae normal.  Neck:     Musculoskeletal: Normal range of motion.  Cardiovascular:     Rate and Rhythm: Normal rate and regular rhythm.     Pulses: Normal pulses.          Radial pulses are 2+ on the right side and 2+ on the left side.  Pulmonary:     Effort: No tachypnea, accessory muscle usage, prolonged expiration, respiratory distress or retractions.     Breath sounds: No stridor.     Comments: Equal chest rise. No increased work of breathing. Abdominal:     General: There is no distension.     Palpations: Abdomen is soft.     Tenderness: There is no abdominal tenderness. There is no guarding or rebound.  Genitourinary:    Scrotum/Testes:        Right: Swelling not present.        Left: Swelling not present.  Musculoskeletal:     Comments: Moves all extremities equally and without difficulty.  Skin:    General: Skin is warm and dry.     Capillary Refill: Capillary refill takes less than 2 seconds.  Neurological:     Mental Status: He is alert.     GCS: GCS eye subscore is 4. GCS verbal subscore is 5. GCS motor subscore is 6.     Comments: Speech is clear and goal oriented.  Psychiatric:        Mood and Affect: Mood normal.      ED Treatments / Results  Labs (all labs ordered are listed, but only abnormal results are displayed) Labs Reviewed  COMPREHENSIVE METABOLIC PANEL - Abnormal; Notable for the following components:      Result Value   Glucose, Bld 126 (*)    All other components within normal limits  BRAIN NATRIURETIC PEPTIDE - Abnormal; Notable for the  following components:   B Natriuretic Peptide 1,200.8 (*)    All other components within normal limits  COMPREHENSIVE METABOLIC PANEL - Abnormal; Notable for the following components:   Glucose, Bld 116 (*)    All other components within normal limits  BASIC METABOLIC PANEL - Abnormal; Notable for the following components:   Chloride 97 (*)    Glucose, Bld 114 (*)    All other components within normal limits  TROPONIN I (HIGH SENSITIVITY) - Abnormal; Notable for the following components:   Troponin I (High Sensitivity) 42 (*)    All other components within normal limits  TROPONIN I (HIGH SENSITIVITY) - Abnormal; Notable for the following components:   Troponin I (High Sensitivity) 46 (*)  All other components within normal limits  TROPONIN I (HIGH SENSITIVITY) - Abnormal; Notable for the following components:   Troponin I (High Sensitivity) 41 (*)    All other components within normal limits  TROPONIN I (HIGH SENSITIVITY) - Abnormal; Notable for the following components:   Troponin I (High Sensitivity) 41 (*)    All other components within normal limits  SARS CORONAVIRUS 2  MRSA PCR SCREENING  LIPASE, BLOOD  CBC  URINALYSIS, ROUTINE W REFLEX MICROSCOPIC  CBC WITH DIFFERENTIAL/PLATELET  MAGNESIUM  PHOSPHORUS  MAGNESIUM  HIV ANTIBODY (ROUTINE TESTING W REFLEX)  HEMOGLOBIN A1C  MAGNESIUM  PHOSPHORUS  COMPREHENSIVE METABOLIC PANEL  CBC WITH DIFFERENTIAL/PLATELET   ED ECG REPORT   Date: 12/10/2018  Rate: 83  Rhythm: sinus with trigeminy  QRS Axis: left  Intervals: normal  ST/T Wave abnormalities: nonspecific ST/T changes  Conduction Disutrbances:none  Narrative Interpretation:   Old EKG Reviewed: changes noted  I have personally reviewed the EKG tracing and agree with the computerized printout as noted.    Radiology Dg Chest Port 1 View  Result Date: 12/10/2018 CLINICAL DATA:  53 year old male with cough and abdominal pain. EXAM: PORTABLE CHEST 1 VIEW COMPARISON:   03/29/2018 and earlier. FINDINGS: Portable AP upright view at 0224 hours. Increased cardiac silhouette since 2019. Other mediastinal contours are within normal limits. Visualized tracheal air column is within normal limits. Stable lung volumes. Allowing for portable technique the lungs are clear. No pneumothorax. Negative visible bowel gas pattern. No acute osseous abnormality identified. IMPRESSION: 1. Suggestion of increased cardiac silhouette since 2019. Consider pericardial effusion. 2. Otherwise no acute cardiopulmonary abnormality. Electronically Signed   By: Genevie Ann M.D.   On: 12/10/2018 02:52    Procedures Procedures (including critical care time)  Medications Ordered in ED Medications  pantoprazole (PROTONIX) EC tablet 40 mg (40 mg Oral Given 12/10/18 1608)  sodium chloride flush (NS) 0.9 % injection 3 mL (3 mLs Intravenous Given 12/10/18 2124)  sodium chloride flush (NS) 0.9 % injection 3 mL (has no administration in time range)  0.9 %  sodium chloride infusion (has no administration in time range)  acetaminophen (TYLENOL) tablet 650 mg (has no administration in time range)  ondansetron (ZOFRAN) injection 4 mg (has no administration in time range)  enoxaparin (LOVENOX) injection 40 mg (40 mg Subcutaneous Given 12/10/18 1738)  furosemide (LASIX) injection 40 mg (40 mg Intravenous Given 12/10/18 1731)  benzonatate (TESSALON) capsule 200 mg (has no administration in time range)  guaiFENesin (MUCINEX) 12 hr tablet 1,200 mg (1,200 mg Oral Given 12/10/18 2121)  sodium chloride flush (NS) 0.9 % injection 3 mL (3 mLs Intravenous Given by Other 12/10/18 0410)     Initial Impression / Assessment and Plan / ED Course  I have reviewed the triage vital signs and the nursing notes.  Pertinent labs & imaging results that were available during my care of the patient were reviewed by me and considered in my medical decision making (see chart for details).  Clinical Course as of Dec 09 2228  Fri Dec 10, 2018  0458 Discussed with Dr. Myna Hidalgo who will admit   [HM]    Clinical Course User Index [HM] Evalyse Stroope, Jarrett Soho, [place]       Patient presents with ongoing worsening shortness of breath and orthopnea.  No dyspnea on exertion.  EKG with trigeminy new from previous EKG.  Troponin is elevated, above previous values.  Patient does deny chest pain.  No significant peripheral edema on exam.  Chest  x-ray without pulmonary edema but does show enlarged cardiac silhouette.  Doubt cardiac tamponade given the gradual onset of patient's symptoms and lack of other tamponade symptoms.  No JVD.  Patient does have hypoxia into the low 80s with sleeping.  He does not wear oxygen at home.  This improves after arousal.  Elevated BNP.  Suspect new onset heart failure.  Patient will be admitted for diuresis and further evaluation including echocardiogram.  The patient was discussed with and seen by Dr. Christy Gentles who agrees with the treatment plan.   Final Clinical Impressions(s) / ED Diagnoses   Final diagnoses:  Acute congestive heart failure, unspecified heart failure type (HCC)  Elevated troponin  Elevated brain natriuretic peptide (BNP) level    ED Discharge Orders    None       Loni Muse Gwenlyn Perking 12/10/18 2230    Ripley Fraise, MD 12/11/18 [phone]

## 2018-12-11 ENCOUNTER — Observation Stay (HOSPITAL_BASED_OUTPATIENT_CLINIC_OR_DEPARTMENT_OTHER): Payer: Self-pay

## 2018-12-11 ENCOUNTER — Encounter (HOSPITAL_COMMUNITY): Payer: Self-pay | Admitting: Internal Medicine

## 2018-12-11 DIAGNOSIS — I472 Ventricular tachycardia: Secondary | ICD-10-CM

## 2018-12-11 DIAGNOSIS — I5021 Acute systolic (congestive) heart failure: Secondary | ICD-10-CM

## 2018-12-11 DIAGNOSIS — I429 Cardiomyopathy, unspecified: Secondary | ICD-10-CM

## 2018-12-11 DIAGNOSIS — I5041 Acute combined systolic (congestive) and diastolic (congestive) heart failure: Secondary | ICD-10-CM | POA: Diagnosis present

## 2018-12-11 DIAGNOSIS — R05 Cough: Secondary | ICD-10-CM

## 2018-12-11 DIAGNOSIS — I340 Nonrheumatic mitral (valve) insufficiency: Secondary | ICD-10-CM

## 2018-12-11 DIAGNOSIS — R7303 Prediabetes: Secondary | ICD-10-CM

## 2018-12-11 DIAGNOSIS — I4729 Other ventricular tachycardia: Secondary | ICD-10-CM

## 2018-12-11 DIAGNOSIS — K219 Gastro-esophageal reflux disease without esophagitis: Secondary | ICD-10-CM

## 2018-12-11 DIAGNOSIS — R059 Cough, unspecified: Secondary | ICD-10-CM

## 2018-12-11 LAB — COMPREHENSIVE METABOLIC PANEL
ALT: 34 U/L (ref 0–44)
AST: 27 U/L (ref 15–41)
Albumin: 3.7 g/dL (ref 3.5–5.0)
Alkaline Phosphatase: 73 U/L (ref 38–126)
Anion gap: 10 (ref 5–15)
BUN: 18 mg/dL (ref 6–20)
CO2: 29 mmol/L (ref 22–32)
Calcium: 9.5 mg/dL (ref 8.9–10.3)
Chloride: 98 mmol/L (ref 98–111)
Creatinine, Ser: 1.32 mg/dL — ABNORMAL HIGH (ref 0.61–1.24)
GFR calc Af Amer: >60 mL/min (ref >60)
GFR calc non Af Amer: >60 mL/min (ref >60)
Glucose, Bld: 105 mg/dL — ABNORMAL HIGH (ref 70–99)
Potassium: 4.3 mmol/L (ref 3.5–5.1)
Sodium: 137 mmol/L (ref 135–145)
Total Bilirubin: 0.8 mg/dL (ref 0.3–1.2)
Total Protein: 7.9 g/dL (ref 6.5–8.1)

## 2018-12-11 LAB — CBC WITH DIFFERENTIAL/PLATELET
Abs Immature Granulocytes: 0.04 10*3/uL (ref 0.00–0.07)
Basophils Absolute: 0.1 10*3/uL (ref 0.0–0.1)
Basophils Relative: 1 %
Eosinophils Absolute: 0.5 10*3/uL (ref 0.0–0.5)
Eosinophils Relative: 5 %
HCT: 50.5 % (ref 39.0–52.0)
Hemoglobin: 16.4 g/dL (ref 13.0–17.0)
Immature Granulocytes: 0 %
Lymphocytes Relative: 29 %
Lymphs Abs: 3 10*3/uL (ref 0.7–4.0)
MCH: 29.8 pg (ref 26.0–34.0)
MCHC: 32.5 g/dL (ref 30.0–36.0)
MCV: 91.8 fL (ref 80.0–100.0)
Monocytes Absolute: 0.8 10*3/uL (ref 0.1–1.0)
Monocytes Relative: 8 %
Neutro Abs: 6 10*3/uL (ref 1.7–7.7)
Neutrophils Relative %: 57 %
Platelets: 228 10*3/uL (ref 150–400)
RBC: 5.5 MIL/uL (ref 4.22–5.81)
RDW: 13.2 % (ref 11.5–15.5)
WBC: 10.5 10*3/uL (ref 4.0–10.5)
nRBC: 0 % (ref 0.0–0.2)

## 2018-12-11 LAB — TSH: TSH: 9.49 u[IU]/mL — ABNORMAL HIGH (ref 0.350–4.500)

## 2018-12-11 LAB — PHOSPHORUS: Phosphorus: 4.2 mg/dL (ref 2.5–4.6)

## 2018-12-11 LAB — MAGNESIUM: Magnesium: 2.1 mg/dL (ref 1.7–2.4)

## 2018-12-11 LAB — HIV ANTIBODY (ROUTINE TESTING W REFLEX): HIV Screen 4th Generation wRfx: NONREACTIVE

## 2018-12-11 LAB — ECHOCARDIOGRAM COMPLETE
Height: 72 in
Weight: 3115.2 oz

## 2018-12-11 LAB — HEMOGLOBIN A1C
Hgb A1c MFr Bld: 5.9 % — ABNORMAL HIGH (ref 4.8–5.6)
Mean Plasma Glucose: 122.63 mg/dL

## 2018-12-11 MED ORDER — FUROSEMIDE 10 MG/ML IJ SOLN: 40.0000 mg | Freq: Every day | INTRAMUSCULAR | Status: DC

## 2018-12-11 MED ORDER — CARVEDILOL 3.125 MG PO TABS
3.1250 mg | ORAL_TABLET | Freq: Two times a day (BID) | ORAL | Status: DC
  Administered 2018-12-11 – 2018-12-20 (×20): 3.125 mg via ORAL
  Filled 2018-12-11 (×20): qty 1

## 2018-12-11 NOTE — Progress Notes (Signed)
   12/11/18 0551  ECG Monitoring  Ectopy Couplet PVC's;Multifocal PVC's;Ventricular trigeminy (Run PVC's~RN Brilynn Biasi notified)  Ectopy Frequency Frequent  C. Bodenheimer NP notified, awaiting blood work results

## 2018-12-11 NOTE — Progress Notes (Addendum)
CCMD called regarding pt's Heart rhythm of V-tach and multifocal PVCs. Notified MD and cardiologist.

## 2018-12-11 NOTE — TOC Initial Note (Signed)
Transition of Care [facility]) - Initial/Assessment Note    Patient Details  Name: Jonathan King MRN: [id number] Date of Birth: 10-13-1965  [facility] CM/SW Contact:    Carles Collet, RN Phone Number: 12/11/2018, 4:40 PM  Clinical Narrative:                [name] w patient and roommate at bedside. Patient does not have insurance or PCP. Discussed [facility] and he is interested in having appointment made this week when office open. He states he usually [facility] if he needs any but is not on any recurrent prescriptions. He states he drives and has a car.  CM will continue to follow for DC planning. Medical workup continues, per notes may have cath Monday to r/o ischemia.    Expected Discharge Plan: [place] Barriers to Discharge: Continued Medical Work up   Patient Goals and CMS Choice        Expected Discharge Plan and Services Expected Discharge Plan: [place]       Living arrangements for the past 2 months: Single Family Home                                      Prior Living Arrangements/Services Living arrangements for the past 2 months: Single Family Home Lives with:: Roommate, Other (Comment)(is also POA)                   Activities of Daily Living Home Assistive Devices/Equipment: None ADL Screening (condition at time of admission) Patient's cognitive ability adequate to safely complete daily activities?: Yes Is the patient deaf or have difficulty hearing?: No Does the patient have difficulty seeing, even when wearing glasses/contacts?: No Does the patient have difficulty concentrating, remembering, or making decisions?: No Patient able to express need for assistance with ADLs?: Yes Does the patient have difficulty dressing or bathing?: No Independently performs ADLs?: Yes (appropriate for developmental age) Does the patient have difficulty walking or climbing stairs?: No Weakness of Legs:  None Weakness of Arms/Hands: None  Permission Sought/Granted                  Emotional Assessment              Admission diagnosis:  Elevated troponin [R79.89] Elevated brain natriuretic peptide (BNP) level [R79.89] Acute congestive heart failure, unspecified heart failure type ([place]) [I50.9] Patient Active Problem List   Diagnosis Date Noted  . NSVT (nonsustained ventricular tachycardia) ([place]) 12/11/2018  . 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 Pre-diabetes 12/11/2018  . GERD (gastroesophageal reflux disease) 12/11/2018  . Cough 12/11/2018  . Acute systolic (congestive) heart failure ([place]) 12/10/2018  . MRSA infection 05/23/2013  . Acute cholecystitis 01/30/2012   PCP:  Patient, No Pcp Per Pharmacy:   [facility] [place] [address] Phone: [phone] Fax: [phone]     Social Determinants of Health (SDOH) Interventions    Readmission Risk Interventions No flowsheet data found.

## 2018-12-11 NOTE — Consult Note (Addendum)
CONSULTATION NOTE   Patient Name: Jonathan King Date of Encounter: 12/11/2018 Cardiologist: No primary care provider on file.  Chief Complaint   Shortness of breath  Patient Profile   53 yo male with orthopnea, DOE and edema, elevated BNP of 1201, admitted for CHF  HPI   Jonathan King is a 53 y.o. male who is being seen today for the evaluation of CHF at the request of Dr. Alfredia Ferguson. This is a 53 yo male without any significant history other than prior cholecystectomy in 2013, who presents with progressive DOE, orthopnea, weight gain and lower extremity edema. This has been over the past month and is associated with some abdominal pain and nausea, however, has been taking PPI for that with relief. On initial presentation he was mildly hypoxic at 90% on RA, CXR shows increased cardiac silhoutte, BNP elevated at 1201 with indeterminate flat troponins of [phone]. Echo performed today and I personally reviewed it, demonstrating LVEF 15-20%, severe global HK, dilated LV, moderate MR, trivial AI, mild LAE, elevated LV filling pressure. He reports his breathing is better today. Frequent PVC's and NSVT noted overnight.  PMHx   Past Medical History:  Diagnosis Date  . Back pain     Past Surgical History:  Procedure Laterality Date  . CHOLECYSTECTOMY  01/31/2012   Procedure: LAPAROSCOPIC CHOLECYSTECTOMY;  Surgeon: Gayland Curry, MD,FACS;  Location: [facility] OR;  Service: General;  Laterality: N/A;    FAMHx   Family History  Problem Relation Age of Onset  . Parkinson's disease Mother     SOCHx    reports that he has never smoked. He has never used smokeless tobacco. He reports current alcohol use. He reports that he does not use drugs.  Outpatient Medications   No current facility-administered medications on file prior to encounter.    Current Outpatient Medications on File Prior to Encounter  Medication Sig Dispense Refill  . ibuprofen (ADVIL,MOTRIN) 600 MG tablet Take 1 tablet  (600 mg total) by mouth every 6 (six) hours as needed. For pain (Patient taking differently: Take 400 mg by mouth every 6 (six) hours as needed for moderate pain. ) 30 tablet 0  . omeprazole (PRILOSEC) 20 MG capsule Take 1 capsule (20 mg total) by mouth daily. 30 capsule 0  . ondansetron (ZOFRAN ODT) 4 MG disintegrating tablet Take 1 tablet (4 mg total) by mouth every 8 (eight) hours as needed for nausea or vomiting. 20 tablet 0    Inpatient Medications    Scheduled Meds: . carvedilol  3.125 mg Oral BID  . enoxaparin (LOVENOX) injection  40 mg Subcutaneous Q24H  . [START ON 12/12/2018] furosemide  40 mg Intravenous Daily  . guaiFENesin  1,200 mg Oral BID  . pantoprazole  40 mg Oral Daily  . sodium chloride flush  3 mL Intravenous Q12H    Continuous Infusions: . sodium chloride      PRN Meds: sodium chloride, acetaminophen, benzonatate, ondansetron (ZOFRAN) IV, sodium chloride flush   ALLERGIES   No Known Allergies  ROS   Pertinent items noted in HPI and remainder of comprehensive ROS otherwise negative.  Vitals   Vitals:   12/11/18 0048 12/11/18 0526 12/11/18 0838 12/11/18 0918  BP: 113/85 106/86 115/89 109/80  Pulse: 75 (!) 51 (!) 106 95  Resp: 18 18 20    Temp: 98.3 F (36.8 C) (!) 97.5 F (36.4 C) 98.7 F (37.1 C)   TempSrc: Oral Oral Oral   SpO2: 93% 91% 93% (!) 89%  Weight:  88.3 kg    Height:        Intake/Output Summary (Last 24 hours) at 12/11/2018 1142 Last data filed at 12/11/2018 1100 Gross per 24 hour  Intake 720 ml  Output 4675 ml  Net -3955 ml   Filed Weights   12/10/18 1212 12/11/18 0526  Weight: 76.6 kg 88.3 kg    Physical Exam   General appearance: alert and no distress Neck: JVD - several cm above sternal notch, no carotid bruit and thyroid not enlarged, symmetric, no tenderness/mass/nodules Lungs: diminished breath sounds bilaterally and rales bilaterally Heart: regular rate and rhythm, S1, S2 normal and S3 present Abdomen: soft,  non-tender; bowel sounds normal; no masses,  no organomegaly Extremities: edema 1+ edema Pulses: 2+ and symmetric Skin: pale, warm, dry Neurologic: Grossly normal Psych: Pleasant, stutters  Labs   Results for orders placed or performed during the hospital encounter of 12/09/18 (from the past 48 hour(s))  Lipase, blood     Status: None   Collection Time: 12/09/18  7:47 PM  Result Value Ref Range   Lipase 31 11 - 51 U/L    Comment: Performed at [facility], [address] [place], [place] [address]  Comprehensive metabolic panel     Status: Abnormal   Collection Time: 12/09/18  7:47 PM  Result Value Ref Range   Sodium 139 135 - 145 mmol/L   Potassium 4.0 3.5 - 5.1 mmol/L   Chloride 105 98 - 111 mmol/L   CO2 23 22 - 32 mmol/L   Glucose, Bld 126 (H) 70 - 99 mg/dL   BUN 20 6 - 20 mg/dL   Creatinine, Ser 1.20 0.61 - 1.24 mg/dL   Calcium 9.0 8.9 - 10.3 mg/dL   Total Protein 7.4 6.5 - 8.1 g/dL   Albumin 3.8 3.5 - 5.0 g/dL   AST 23 15 - 41 U/L   ALT 32 0 - 44 U/L   Alkaline Phosphatase 77 38 - 126 U/L   Total Bilirubin 0.7 0.3 - 1.2 mg/dL   GFR calc non Af Amer >60 >60 mL/min   GFR calc Af Amer >60 >60 mL/min   Anion gap 11 5 - 15    Comment: Performed at [facility] [address] [place], [place] [address]  CBC     Status: None   Collection Time: 12/09/18  7:47 PM  Result Value Ref Range   WBC 8.1 4.0 - 10.5 K/uL   RBC 5.00 4.22 - 5.81 MIL/uL   Hemoglobin 15.0 13.0 - 17.0 g/dL   HCT 47.3 39.0 - 52.0 %   MCV 94.6 80.0 - 100.0 fL   MCH 30.0 26.0 - 34.0 pg   MCHC 31.7 30.0 - 36.0 g/dL   RDW 13.3 11.5 - 15.5 %   Platelets 241 150 - 400 K/uL   nRBC 0.0 0.0 - 0.2 %    Comment: Performed at [facility], [place] [address], [place], [place] [address]  Urinalysis, Routine w reflex microscopic     Status: None   Collection Time: 12/09/18 11:49 PM  Result Value Ref Range   Color, Urine YELLOW YELLOW   APPearance CLEAR CLEAR   Specific Gravity, Urine 1.023  1.005 - 1.030   pH 5.0 5.0 - 8.0   Glucose, UA NEGATIVE NEGATIVE mg/dL   Hgb urine dipstick NEGATIVE NEGATIVE   Bilirubin Urine NEGATIVE NEGATIVE   Ketones, ur NEGATIVE NEGATIVE mg/dL   Protein, ur NEGATIVE NEGATIVE mg/dL  Nitrite NEGATIVE NEGATIVE   Leukocytes,Ua NEGATIVE NEGATIVE    Comment: Performed at [facility], [place] [address], [place], [place] [address]  Troponin I (High Sensitivity)     Status: Abnormal   Collection Time: 12/10/18  2:48 AM  Result Value Ref Range   Troponin I (High Sensitivity) 42 (H) <18 ng/L    Comment: (NOTE) Elevated high sensitivity troponin I (hsTnI) values and significant  changes across serial measurements may suggest ACS but many other  chronic and acute conditions are known to elevate hsTnI results.  Refer to the "Links" section for chest pain algorithms and additional  guidance. Performed at [facility], [place] [address] [place], [place] [address]   Brain natriuretic peptide     Status: Abnormal   Collection Time: 12/10/18  2:48 AM  Result Value Ref Range   B Natriuretic Peptide 1,200.8 (H) 0.0 - 100.0 pg/mL    Comment: Performed at [facility] [address], [place], [place] [address]  Troponin I (High Sensitivity)     Status: Abnormal   Collection Time: 12/10/18  4:19 AM  Result Value Ref Range   Troponin I (High Sensitivity) 46 (H) <18 ng/L    Comment: (NOTE) Elevated high sensitivity troponin I (hsTnI) values and significant  changes across serial measurements may suggest ACS but many other  chronic and acute conditions are known to elevate hsTnI results.  Refer to the "Links" section for chest pain algorithms and additional  guidance. Performed at [facility], [place] [address] [place], [place] [address]   SARS CORONAVIRUS 2 Nasal Swab Aptima Multi Swab     Status: None   Collection Time: 12/10/18  4:43 AM   Specimen: Aptima Multi Swab; Nasal Swab  Result Value Ref Range   SARS Coronavirus 2 NEGATIVE  NEGATIVE    Comment: (NOTE) SARS-CoV-2 target nucleic acids are NOT DETECTED. The SARS-CoV-2 RNA is generally detectable in upper and lower respiratory specimens during the acute phase of infection. Negative results do not preclude SARS-CoV-2 infection, do not rule out co-infections with other pathogens, and should not be used as the sole basis for treatment or other patient management decisions. Negative results must be combined with clinical observations, patient history, and epidemiological information. The expected result is Negative. Fact Sheet for Patients: SugarRoll.be Fact Sheet for Healthcare Providers: https://www.woods-mathews.com/ This test is not yet approved or cleared by the [place] FDA and  has been authorized for detection and/or diagnosis of SARS-CoV-2 by FDA under an Emergency Use Authorization (EUA). This EUA will remain  in effect (meaning this test can be used) for the duration of the COVID-19 declaration under Section 56 4(b)(1) of the Act, 21 U.S.C. section 360bbb-3(b)(1), unless the authorization is terminated or revoked sooner. Performed at [facility], [place] [address], [place], [place] [address]   CBC with Differential/Platelet     Status: None   Collection Time: 12/10/18 11:54 AM  Result Value Ref Range   WBC 9.8 4.0 - 10.5 K/uL   RBC 4.92 4.22 - 5.81 MIL/uL   Hemoglobin 14.6 13.0 - 17.0 g/dL   HCT 45.9 39.0 - 52.0 %   MCV 93.3 80.0 - 100.0 fL   MCH 29.7 26.0 - 34.0 pg   MCHC 31.8 30.0 - 36.0 g/dL   RDW 13.2 11.5 - 15.5 %   Platelets 220 150 - 400 K/uL   nRBC 0.0 0.0 - 0.2 %   Neutrophils Relative % 68 %  Neutro Abs 6.7 1.7 - 7.7 K/uL   Lymphocytes Relative 21 %   Lymphs Abs 2.1 0.7 - 4.0 K/uL   Monocytes Relative 7 %   Monocytes Absolute 0.7 0.1 - 1.0 K/uL   Eosinophils Relative 3 %   Eosinophils Absolute 0.3 0.0 - 0.5 K/uL   Basophils Relative 1 %   Basophils Absolute 0.1 0.0 - 0.1 K/uL    Immature Granulocytes 0 %   Abs Immature Granulocytes 0.02 0.00 - 0.07 K/uL    Comment: Performed at [facility] [address], [place], [place] [address]  Comprehensive metabolic panel     Status: Abnormal   Collection Time: 12/10/18 11:54 AM  Result Value Ref Range   Sodium 137 135 - 145 mmol/L   Potassium 3.9 3.5 - 5.1 mmol/L   Chloride 99 98 - 111 mmol/L   CO2 29 22 - 32 mmol/L   Glucose, Bld 116 (H) 70 - 99 mg/dL   BUN 16 6 - 20 mg/dL   Creatinine, Ser 1.16 0.61 - 1.24 mg/dL   Calcium 8.9 8.9 - 10.3 mg/dL   Total Protein 7.2 6.5 - 8.1 g/dL   Albumin 3.7 3.5 - 5.0 g/dL   AST 21 15 - 41 U/L   ALT 29 0 - 44 U/L   Alkaline Phosphatase 76 38 - 126 U/L   Total Bilirubin 0.7 0.3 - 1.2 mg/dL   GFR calc non Af Amer >60 >60 mL/min   GFR calc Af Amer >60 >60 mL/min   Anion gap 9 5 - 15    Comment: Performed at [facility] [address], [place], [place] [address]  Magnesium     Status: None   Collection Time: 12/10/18 11:54 AM  Result Value Ref Range   Magnesium 2.1 1.7 - 2.4 mg/dL    Comment: Performed at [facility] [address], [place], [place] [address]  Phosphorus     Status: None   Collection Time: 12/10/18 11:54 AM  Result Value Ref Range   Phosphorus 3.1 2.5 - 4.6 mg/dL    Comment: Performed at [facility] [address], [place], [place] [address]  Troponin I (High Sensitivity)     Status: Abnormal   Collection Time: 12/10/18 11:54 AM  Result Value Ref Range   Troponin I (High Sensitivity) 41 (H) <18 ng/L    Comment: (NOTE) Elevated high sensitivity troponin I (hsTnI) values and significant  changes across serial measurements may suggest ACS but many other  chronic and acute conditions are known to elevate hsTnI results.  Refer to the "Links" section for chest pain algorithms and additional  guidance. Performed at [facility], [place] [address] [place], [place] [address]   Troponin I (High Sensitivity)     Status: Abnormal    Collection Time: 12/10/18  1:02 PM  Result Value Ref Range   Troponin I (High Sensitivity) 41 (H) <18 ng/L    Comment: (NOTE) Elevated high sensitivity troponin I (hsTnI) values and significant  changes across serial measurements may suggest ACS but many other  chronic and acute conditions are known to elevate hsTnI results.  Refer to the "Links" section for chest pain algorithms and additional  guidance. Performed at [facility], [place] [address] [place], [place] [address]   MRSA PCR Screening     Status: None   Collection Time: 12/10/18  6:24 PM   Specimen: Nasopharyngeal  Result Value Ref Range  MRSA by PCR NEGATIVE NEGATIVE    Comment:        The GeneXpert MRSA Assay (FDA approved for NASAL specimens only), is one component of a comprehensive MRSA colonization surveillance program. It is not intended to diagnose MRSA infection nor to guide or monitor treatment for MRSA infections. Performed at [facility], [place] [address], [place], [place] [address]   Basic metabolic panel     Status: Abnormal   Collection Time: 12/10/18  7:25 PM  Result Value Ref Range   Sodium 138 135 - 145 mmol/L   Potassium 3.7 3.5 - 5.1 mmol/L   Chloride 97 (L) 98 - 111 mmol/L   CO2 28 22 - 32 mmol/L   Glucose, Bld 114 (H) 70 - 99 mg/dL   BUN 15 6 - 20 mg/dL   Creatinine, Ser 1.22 0.61 - 1.24 mg/dL   Calcium 9.5 8.9 - 10.3 mg/dL   GFR calc non Af Amer >60 >60 mL/min   GFR calc Af Amer >60 >60 mL/min   Anion gap 13 5 - 15    Comment: Performed at [facility], [place] [address], [place], [place] [address]  Magnesium     Status: None   Collection Time: 12/10/18  7:25 PM  Result Value Ref Range   Magnesium 2.1 1.7 - 2.4 mg/dL    Comment: Performed at [facility], [place] [address] [place], [place] [address]  Hemoglobin A1c     Status: Abnormal   Collection Time: 12/11/18  5:11 AM  Result Value Ref Range   Hgb A1c MFr Bld 5.9 (H) 4.8 - 5.6 %    Comment: (NOTE) Pre  diabetes:          5.7%-6.4% Diabetes:              >6.4% Glycemic control for   <7.0% adults with diabetes    Mean Plasma Glucose 122.63 mg/dL    Comment: Performed at [facility] [address] [place], [place] [address]  Magnesium     Status: None   Collection Time: 12/11/18  5:11 AM  Result Value Ref Range   Magnesium 2.1 1.7 - 2.4 mg/dL    Comment: Performed at [facility] [address], [place], [place] [address]  Phosphorus     Status: None   Collection Time: 12/11/18  5:11 AM  Result Value Ref Range   Phosphorus 4.2 2.5 - 4.6 mg/dL    Comment: Performed at [facility] [address], [place], [place] [address]  Comprehensive metabolic panel     Status: Abnormal   Collection Time: 12/11/18  5:11 AM  Result Value Ref Range   Sodium 137 135 - 145 mmol/L   Potassium 4.3 3.5 - 5.1 mmol/L   Chloride 98 98 - 111 mmol/L   CO2 29 22 - 32 mmol/L   Glucose, Bld 105 (H) 70 - 99 mg/dL   BUN 18 6 - 20 mg/dL   Creatinine, Ser 1.32 (H) 0.61 - 1.24 mg/dL   Calcium 9.5 8.9 - 10.3 mg/dL   Total Protein 7.9 6.5 - 8.1 g/dL   Albumin 3.7 3.5 - 5.0 g/dL   AST 27 15 - 41 U/L   ALT 34 0 - 44 U/L   Alkaline Phosphatase 73 38 - 126 U/L   Total Bilirubin 0.8 0.3 - 1.2 mg/dL   GFR calc non Af Amer >60 >60 mL/min   GFR calc Af Amer >60 >  60 mL/min   Anion gap 10 5 - 15    Comment: Performed at [facility] [address] [place], [place] [address]  CBC with Differential/Platelet     Status: None   Collection Time: 12/11/18  5:11 AM  Result Value Ref Range   WBC 10.5 4.0 - 10.5 K/uL   RBC 5.50 4.22 - 5.81 MIL/uL   Hemoglobin 16.4 13.0 - 17.0 g/dL   HCT 50.5 39.0 - 52.0 %   MCV 91.8 80.0 - 100.0 fL   MCH 29.8 26.0 - 34.0 pg   MCHC 32.5 30.0 - 36.0 g/dL   RDW 13.2 11.5 - 15.5 %   Platelets 228 150 - 400 K/uL   nRBC 0.0 0.0 - 0.2 %   Neutrophils Relative % 57 %   Neutro Abs 6.0 1.7 - 7.7 K/uL   Lymphocytes Relative 29 %   Lymphs Abs 3.0 0.7 - 4.0 K/uL    Monocytes Relative 8 %   Monocytes Absolute 0.8 0.1 - 1.0 K/uL   Eosinophils Relative 5 %   Eosinophils Absolute 0.5 0.0 - 0.5 K/uL   Basophils Relative 1 %   Basophils Absolute 0.1 0.0 - 0.1 K/uL   Immature Granulocytes 0 %   Abs Immature Granulocytes 0.04 0.00 - 0.07 K/uL    Comment: Performed at [facility], [address] [place], [place] [address]    ECG   Sinus with PVC's at 100, LAFB - Personally Reviewed  Telemetry   Sinus rhythm with PVC's- Personally Reviewed  Radiology   Dg Chest Port 1 View  Result Date: 12/10/2018 CLINICAL DATA:  53 year old male with cough and abdominal pain. EXAM: PORTABLE CHEST 1 VIEW COMPARISON:  03/29/2018 and earlier. FINDINGS: Portable AP upright view at 0224 hours. Increased cardiac silhouette since 2019. Other mediastinal contours are within normal limits. Visualized tracheal air column is within normal limits. Stable lung volumes. Allowing for portable technique the lungs are clear. No pneumothorax. Negative visible bowel gas pattern. No acute osseous abnormality identified. IMPRESSION: 1. Suggestion of increased cardiac silhouette since 2019. Consider pericardial effusion. 2. Otherwise no acute cardiopulmonary abnormality. Electronically Signed   By: Genevie Ann M.D.   On: 12/10/2018 02:52    Cardiac Studies   Procedure: 2D Echo  Indications:    dyspnea 786.09   History:        Patient has no prior history of Echocardiogram examinations.   Sonographer:    Johny Chess Referring Phys: [id number] OMAIR LATIF [place]    1. The left ventricle has a visually estimated ejection fraction of 15-20%. The cavity size was moderately dilated. Findings are consistent with dilated cardiomyopathy. Left ventricular diastolic Doppler parameters are consistent with restrictive  filling. Elevated left atrial and left ventricular end-diastolic pressures Left ventricular diffuse hypokinesis.  2. The right ventricle has moderately  reduced systolic function. The cavity was normal. There is no increase in right ventricular wall thickness.  3. Left atrial size was mildly dilated.  4. The mitral valve is abnormal. Mild thickening of the mitral valve leaflet. Mitral valve regurgitation is moderate by color flow Doppler.  5. The tricuspid valve is grossly normal.  6. The aortic valve is abnormal. Mild thickening of the aortic valve. Aortic valve regurgitation is trivial by color flow Doppler. No stenosis of the aortic valve.  7. The aorta is normal unless otherwise noted.  8. The inferior vena cava was dilated in size with <50% respiratory variability.  SUMMARY  LVEF 15-20%, severe global hypokinesis with moderately dilated LV, grade 3 DD with elevated LV filling pressure, moderate RV hypokinesis, mild LAE, moderate MR, trivial AI, dilated IVC  Impression   Principal Problem:   Acute systolic (congestive) heart failure (HCC) Active Problems:   NSVT (nonsustained ventricular tachycardia) (HCC)   Recommendation   1. Mr. 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 Would recommend definitive R/LHC on Monday to exclude ischemia. Diuresed well overnight- creatinine bumped, so may slow rate of diuresis. Will start low dose coreg 3.125 mg BID today given frequent PVC's and NSVT. Would consider Entresto prior to d/c after cath if renal function stabilized.  Thanks for the consultation. Cardiology will follow with you.  Time Spent Directly with Patient:  I have spent a total of 45 minutes with the patient reviewing hospital notes, telemetry, EKGs, labs and examining the patient as well as establishing an assessment and plan that was discussed personally with the patient.  > 50% of time was spent in direct patient care.  Length of Stay:  LOS: 0 days   Pixie Casino, MD, [facility], [place] Director of the Advanced Lipid Disorders &   Cardiovascular Risk Reduction Clinic Diplomate of the American Board of Clinical Lipidology Attending Cardiologist  Direct Dial: [phone]  Fax: [phone]  Website:  [email or web address] Kelita Wallis 12/11/2018, 11:42 AM

## 2018-12-11 NOTE — Progress Notes (Signed)
  Echocardiogram 2D Echocardiogram has been performed.  Jonathan King 12/11/2018, 10:05 AM

## 2018-12-11 NOTE — Progress Notes (Signed)
PROGRESS NOTE    Jonathan King  UUV:[id number] DOB: [birth year] DOA: 12/09/2018 PCP: Patient, No Pcp Per   Brief Narrative:  The patient is a 53 year old Caucasian obese male with no real appreciable past medical history except history of intermittent alcohol abuse who presents with a chief complaint of grossly worsening shortness of breath, nonproductive cough as well as abdominal discomfort and swelling.  He states that he has had trouble laying flat at night and tosses and turns but denies any lower extremity swelling.  He states that symptoms started about a month ago insidiously and have progressively worsened.  Also noted to have some mild abdominal discomfort and nausea over the same interval but was recently prescribed Protonix and Zofran and states that this is alleviated his abdominal discomfort.    He presented to the ED and was found to be afebrile and saturating 90% on room air while at rest but was slightly tachypneic.  Chest x-ray was notable for increased cardiac silhouette from prior and BNP was elevated at 1200.8.  Of note SARS-CoV-2 testing was still pending.  CHF of unknown type given his persistently worsening shortness of breath with orthopnea and elevated BNP along with increased cardiac silhouette.  He was started on diuresis and echocardiogram ordered.  Echocardiogram was done and was severely abnormal and showed an EF of 15 to 20% with severe global hypokinesis with moderately dilated left ventricle and grade 3 diastolic dysfunction with elevated left filling pressures, moderate right ventricular hypokinesis as well as mild LAE, moderate Mitral Regurg and trivial AI and dilated IVC.   Cardiology is consulted and recommending changing diuresis because of worsening renal function and planning on cathing the patient on Monday to rule out ischemia.  Assessment & Plan:   Principal Problem:   Acute systolic (congestive) heart failure (HCC) Active Problems:   NSVT (nonsustained  ventricular tachycardia) (HCC)   Acute Combined Systolic and Diastolic CHFExacerbation -Presents with insidiously worsening SOB with orthopnea, no swelling, no fever, and no chest pain -BNP is elevated to 1201 and cardiac silhouette on CXR is increased from prior -Started Diureses with IV Lasix 40 mg BID but but back to IV 40 mg Daily per Cardiology given worsening Renal Fxn -Checked Echocardiogram and showed LVEF 15-20%, severe global hypokinesis with moderately dilated LV, grade 3 DD with elevated LV filling pressure, moderate RV hypokinesis, mild LAE, moderate MR, trivial AI, dilated IVC -Continue to monitor renal function and electrolytes during diuresis -Consider ACE and beta-blocker but will hold on ACE currently and start Entresto Likely; BB to be started and Dr. Debara Pickett and I discussed this and he states the patient will be started on Carvedilol 3.125 mg twice daily given his frequent PVCs and nonsustained V. tach -Strict I's and O's and daily weights and fluid restrict to 1500 mL's -Cycle cardiac troponin and high-sensitivity to rule out ischemia as a cause for suspected CHF; high-sensitivity troponins have relatively been flat -Continue monitor volume status carefully  -Cardiology is consulted for further evaluation and Dr. Debara Pickett feels that the patient has a new onset cardiomyopathy of non-known etiology and likely is nonischemic and appears dilated but recommends a right and left heart cath on Monday to exclude ischemia  Hyperglycemia in the setting of Pre-Diabetes -Patient's blood sugar on admission was 116 and repeat was 126 -Checked Hemoglobin A1c and was 5.9 -Continue to monitor and trend blood sugars carefully and if necessary will need to add sensitive NovoLog sliding scale insulin AC  ? Pericardial Effusion, ruled  out -There is mention of the possible pericardial effusion on chest x-ray -Echocardiogram done showed there is no evidence of any pericardial effusion  NSVT  -Has been having frequent PVCs and a 3 beat run of V. tach -Cardiology planning on starting low-dose beta-blocker with carvedilol 3.125 mg p.o. twice daily  -We will need to Continue to monitor telemetry  GERD -C/w Pantoprazole 40 mg po Daily   Cough -In the setting of pulmonary edema and CHF -Continue with benzonatate and guaifenesin  AKI/Renal Insufficiency -Patient's BUN/creatinine are seen with diuresis and went from 16/1.16 -> 15/1.22 -> 18/1.32 -Decreased Diuresis to IV 40 mg Daily -Avoid Nephrotoxic Medications, Contrast Dyes, as well as Hypotension if possible -Continue to Monitor and Trend Renal Fxn -Repeat CMP in AM    DVT prophylaxis: Enoxaparin 40 mg sq q24h Code Status: FULL CODE Family Communication: No family present at bedside  Disposition Plan: Remain inpatient and will change to inpatient status given that he will be here being diuresed and with possible cath on Monday  Consultants:   Cardiology  Procedures:  ECHOCARDIOGRAM IMPRESSIONS    1. The left ventricle has a visually estimated ejection fraction of 15-20%. The cavity size was moderately dilated. Findings are consistent with dilated cardiomyopathy. Left ventricular diastolic Doppler parameters are consistent with restrictive  filling. Elevated left atrial and left ventricular end-diastolic pressures Left ventricular diffuse hypokinesis.  2. The right ventricle has moderately reduced systolic function. The cavity was normal. There is no increase in right ventricular wall thickness.  3. Left atrial size was mildly dilated.  4. The mitral valve is abnormal. Mild thickening of the mitral valve leaflet. Mitral valve regurgitation is moderate by color flow Doppler.  5. The tricuspid valve is grossly normal.  6. The aortic valve is abnormal. Mild thickening of the aortic valve. Aortic valve regurgitation is trivial by color flow Doppler. No stenosis of the aortic valve.  7. The aorta is normal unless  otherwise noted.  8. The inferior vena cava was dilated in size with <50% respiratory variability.  SUMMARY   LVEF 15-20%, severe global hypokinesis with moderately dilated LV, grade 3 DD with elevated LV filling pressure, moderate RV hypokinesis, mild LAE, moderate MR, trivial AI, dilated IVC  FINDINGS  Left Ventricle: The left ventricle has a visually estimated ejection fraction of 15-20. The cavity size was moderately dilated. There is no increase in left ventricular wall thickness. Findings are consistent with dilated cardiomyopathy. Left  ventricular diastolic Doppler parameters are consistent with restrictive filling. Elevated left atrial and left ventricular end-diastolic pressures Left ventricular diffuse hypokinesis.  Right Ventricle: The right ventricle has moderately reduced systolic function. The cavity was normal. There is no increase in right ventricular wall thickness.  Left Atrium: Left atrial size was mildly dilated.  Right Atrium: Right atrial size was normal in size. Right atrial pressure is estimated at 10 mmHg.  Interatrial Septum: No atrial level shunt detected by color flow Doppler.  Pericardium: There is no evidence of pericardial effusion.  Mitral Valve: The mitral valve is abnormal. Mild thickening of the mitral valve leaflet. Mitral valve regurgitation is moderate by color flow Doppler.  Tricuspid Valve: The tricuspid valve is grossly normal. Tricuspid valve regurgitation was not visualized by color flow Doppler.  Aortic Valve: The aortic valve is abnormal Mild thickening of the aortic valve. Aortic valve regurgitation is trivial by color flow Doppler. There is No stenosis of the aortic valve.  Pulmonic Valve: The pulmonic valve was grossly normal. Pulmonic valve  regurgitation is not visualized by color flow Doppler.  Aorta: The aorta is normal unless otherwise noted.  Venous: The inferior vena cava is dilated in size with less than 50%  respiratory variability.    +--------------+--------++ LEFT VENTRICLE              +--------------+---------++ +--------------+--------++      Diastology              PLAX 2D                     +--------------+---------++ +--------------+--------++      LV e' lateral:6.31 cm/s LV EF:        15-20 %       +--------------+---------++ +--------------+--------++      LV e' medial: 5.66 cm/s LVIDd:        6.50 cm       +--------------+---------++ +--------------+--------++ LVIDs:        5.50 cm  +--------------+--------++ LV PW:        0.90 cm  +--------------+--------++ LV IVS:       0.80 cm  +--------------+--------++ LVOT diam:    2.30 cm  +--------------+--------++ LV SV:        69 ml    +--------------+--------++ LV SV Index:  32.20    +--------------+--------++ LVOT Area:    4.15 cm +--------------+--------++                        +--------------+--------++   +------------------+---------++ LV Volumes (MOD)            +------------------+---------++ LV area d, A4C:   46.30 cm +------------------+---------++ LV area s, A4C:   40.40 cm +------------------+---------++ LV major d, A4C:  9.68 cm   +------------------+---------++ LV major s, A4C:  8.84 cm   +------------------+---------++ LV vol d, MOD A4C:180.0 ml  +------------------+---------++ LV vol s, MOD A4C:150.0 ml  +------------------+---------++ LV SV MOD A4C:    180.0 ml  +------------------+---------++  +---------------+------++ RIGHT VENTRICLE       +---------------+------++ TAPSE (M-mode):1.5 cm +---------------+------++  +---------------+-------++-----------++ LEFT ATRIUM           Index       +---------------+-------++-----------++ LA diam:       4.80 cm2.28 cm/m  +---------------+-------++-----------++ LA Vol (A2C):  81.0 ml38.45 ml/m  +---------------+-------++-----------++ LA Vol (A4C):  74.3 ml35.27 ml/m +---------------+-------++-----------++ LA Biplane Vol:78.6 ml37.32 ml/m +---------------+-------++-----------++ +------------+----------++-----------++ RIGHT ATRIUM          Index       +------------+----------++-----------++ RA Pressure:15.00 mmHg            +------------+----------++-----------++ RA Area:    16.70 cm             +------------+----------++-----------++ RA Volume:  43.70 ml  20.75 ml/m +------------+----------++-----------++    +-------------+-------++ AORTA                +-------------+-------++ Ao Root diam:3.20 cm +-------------+-------++  +---------------+----------++ TRICUSPID VALVE           +---------------+----------++ Estimated RAP: 15.00 mmHg +---------------+----------++   +--------------+-------+ SHUNTS                +--------------+-------+ Systemic Diam:2.30 cm +--------------+-------+   Antimicrobials:  Anti-infectives (From admission, onward)   None     Subjective: Seen and examined at bedside and is very anxious and started crying because he was scared of the cath.  He kept stating "why me, why me."  States he is feeling better respiratory wise and is putting  out a lot of urine.  No chest pain, lightheadedness or dizziness.  No other concerns requested this time.  Objective: Vitals:   12/11/18 0048 12/11/18 0526 12/11/18 0838 12/11/18 0918  BP: 113/85 106/86 115/89 109/80  Pulse: 75 (!) 51 (!) 106 95  Resp: 18 18 20    Temp: 98.3 F (36.8 C) (!) 97.5 F (36.4 C) 98.7 F (37.1 C)   TempSrc: Oral Oral Oral   SpO2: 93% 91% 93% (!) 89%  Weight:  88.3 kg    Height:        Intake/Output Summary (Last 24 hours) at 12/11/2018 1249 Last data filed at 12/11/2018 1100 Gross per 24 hour  Intake 720 ml  Output 4675 ml  Net -3955 ml   Filed Weights   12/10/18 1212 12/11/18 0526  Weight: 76.6 kg  88.3 kg   Examination: Physical Exam:  Constitutional: WN/WD overweight Caucasian male currently in NAD and appears very anxious Eyes: Lids and conjunctivae normal, sclerae anicteric  ENMT: External Ears, Nose appear normal. Grossly normal hearing.  Neck: Appears normal, supple, no cervical masses, normal ROM, no appreciable thyromegaly; no JVD Respiratory: Diminished to auscultation bilaterally with some crackles but no appreciable wheezing, rales, rhonchi. Normal respiratory effort and patient is not tachypenic. No accessory muscle use.  Cardiovascular: RRR, no murmurs / rubs / gallops. S1 and S2 auscultated. No LE extremity edema.   Abdomen: Soft, non-tender, non-distended. Bowel sounds positive x4.  GU: Deferred. Musculoskeletal: No clubbing / cyanosis of digits/nails. No joint deformity upper and lower extremities.  Skin: No rashes, lesions, ulcers. No induration; Warm and dry.  Neurologic: CN 2-12 grossly intact with no focal deficits but patient is stuttering alog. Sensation intact in all 4 Extremities. Romberg sign cerebellar reflexes not assessed.  Psychiatric: Normal judgment and insight. Alert and oriented x 3. Extremely anxious mood and appropriate affect.   Data Reviewed: I have personally reviewed following labs and imaging studies  CBC: Recent Labs  Lab 12/09/18 1947 12/10/18 1154 12/11/18 0511  WBC 8.1 9.8 10.5  NEUTROABS  --  6.7 6.0  HGB 15.0 14.6 16.4  HCT 47.3 45.9 50.5  MCV 94.6 93.3 91.8  PLT 241 220 081   Basic Metabolic Panel: Recent Labs  Lab 12/09/18 1947 12/10/18 1154 12/10/18 1925 12/11/18 0511  NA 139 137 138 137  K 4.0 3.9 3.7 4.3  CL 105 99 97* 98  CO2 23 29 28 29   GLUCOSE 126* 116* 114* 105*  BUN 20 16 15 18   CREATININE 1.20 1.16 1.22 1.32*  CALCIUM 9.0 8.9 9.5 9.5  MG  --  2.1 2.1 2.1  PHOS  --  3.1  --  4.2   GFR: Estimated Creatinine Clearance: 71.9 mL/min (A) (by C-G formula based on SCr of 1.32 mg/dL (H)). Liver Function Tests:  Recent Labs  Lab 12/09/18 1947 12/10/18 1154 12/11/18 0511  AST 23 21 27   ALT 32 29 34  ALKPHOS 77 76 73  BILITOT 0.7 0.7 0.8  PROT 7.4 7.2 7.9  ALBUMIN 3.8 3.7 3.7   Recent Labs  Lab 12/09/18 1947  LIPASE 31   No results for input(s): AMMONIA in the last 168 hours. Coagulation Profile: No results for input(s): INR, PROTIME in the last 168 hours. Cardiac Enzymes: No results for input(s): CKTOTAL, CKMB, CKMBINDEX, TROPONINI in the last 168 hours. BNP (last 3 results) No results for input(s): PROBNP in the last 8760 hours. HbA1C: Recent Labs    12/11/18 0511  HGBA1C 5.9*  CBG: No results for input(s): GLUCAP in the last 168 hours. Lipid Profile: No results for input(s): CHOL, HDL, LDLCALC, TRIG, CHOLHDL, LDLDIRECT in the last 72 hours. Thyroid Function Tests: No results for input(s): TSH, T4TOTAL, FREET4, T3FREE, THYROIDAB in the last 72 hours. Anemia Panel: No results for input(s): VITAMINB12, FOLATE, FERRITIN, TIBC, IRON, RETICCTPCT in the last 72 hours. Sepsis Labs: No results for input(s): PROCALCITON, LATICACIDVEN in the last 168 hours.  Recent Results (from the past 240 hour(s))  SARS CORONAVIRUS 2 Nasal Swab Aptima Multi Swab     Status: None   Collection Time: 12/10/18  4:43 AM   Specimen: Aptima Multi Swab; Nasal Swab  Result Value Ref Range Status   SARS Coronavirus 2 NEGATIVE NEGATIVE Final    Comment: (NOTE) SARS-CoV-2 target nucleic acids are NOT DETECTED. The SARS-CoV-2 RNA is generally detectable in upper and lower respiratory specimens during the acute phase of infection. Negative results do not preclude SARS-CoV-2 infection, do not rule out co-infections with other pathogens, and should not be used as the sole basis for treatment or other patient management decisions. Negative results must be combined with clinical observations, patient history, and epidemiological information. The expected result is Negative. Fact Sheet for Patients:  SugarRoll.be Fact Sheet for Healthcare Providers: https://www.woods-mathews.com/ This test is not yet approved or cleared by the [place] FDA and  has been authorized for detection and/or diagnosis of SARS-CoV-2 by FDA under an Emergency Use Authorization (EUA). This EUA will remain  in effect (meaning this test can be used) for the duration of the COVID-19 declaration under Section 56 4(b)(1) of the Act, 21 U.S.C. section 360bbb-3(b)(1), unless the authorization is terminated or revoked sooner. Performed at [facility], [place] [address], [place], [place] [address]   MRSA PCR Screening     Status: None   Collection Time: 12/10/18  6:24 PM   Specimen: Nasopharyngeal  Result Value Ref Range Status   MRSA by PCR NEGATIVE NEGATIVE Final    Comment:        The GeneXpert MRSA Assay (FDA approved for NASAL specimens only), is one component of a comprehensive MRSA colonization surveillance program. It is not intended to diagnose MRSA infection nor to guide or monitor treatment for MRSA infections. Performed at [facility], [place] [address], [place], [place] [address]     Radiology Studies: Dg Chest [place] 1 View  Result Date: 12/10/2018 CLINICAL DATA:  53 year old male with cough and abdominal pain. EXAM: PORTABLE CHEST 1 VIEW COMPARISON:  03/29/2018 and earlier. FINDINGS: Portable AP upright view at 0224 hours. Increased cardiac silhouette since 2019. Other mediastinal contours are within normal limits. Visualized tracheal air column is within normal limits. Stable lung volumes. Allowing for portable technique the lungs are clear. No pneumothorax. Negative visible bowel gas pattern. No acute osseous abnormality identified. IMPRESSION: 1. Suggestion of increased cardiac silhouette since 2019. Consider pericardial effusion. 2. Otherwise no acute cardiopulmonary abnormality. Electronically Signed   By: Genevie Ann M.D.   On: 12/10/2018 02:52    Scheduled Meds: . carvedilol  3.125 mg Oral BID  . enoxaparin (LOVENOX) injection  40 mg Subcutaneous Q24H  . [START ON 12/12/2018] furosemide  40 mg Intravenous Daily  . guaiFENesin  1,200 mg Oral BID  . pantoprazole  40 mg Oral Daily  . sodium chloride flush  3 mL Intravenous Q12H   Continuous Infusions: . sodium chloride      LOS: 0 days   Kerney Elbe, DO [facility] PAGER is on AMION  If 7PM-7AM, please contact night-coverage [email or web address] Password [facility] 12/11/2018, 12:49 PM

## 2018-12-12 DIAGNOSIS — R7989 Other specified abnormal findings of blood chemistry: Secondary | ICD-10-CM

## 2018-12-12 DIAGNOSIS — D72829 Elevated white blood cell count, unspecified: Secondary | ICD-10-CM

## 2018-12-12 DIAGNOSIS — I472 Ventricular tachycardia: Secondary | ICD-10-CM

## 2018-12-12 DIAGNOSIS — I499 Cardiac arrhythmia, unspecified: Secondary | ICD-10-CM

## 2018-12-12 DIAGNOSIS — E878 Other disorders of electrolyte and fluid balance, not elsewhere classified: Secondary | ICD-10-CM

## 2018-12-12 DIAGNOSIS — R7303 Prediabetes: Secondary | ICD-10-CM

## 2018-12-12 DIAGNOSIS — E876 Hypokalemia: Secondary | ICD-10-CM

## 2018-12-12 DIAGNOSIS — E871 Hypo-osmolality and hyponatremia: Secondary | ICD-10-CM

## 2018-12-12 DIAGNOSIS — R7401 Elevation of levels of liver transaminase levels: Secondary | ICD-10-CM

## 2018-12-12 LAB — COMPREHENSIVE METABOLIC PANEL
ALT: 45 U/L — ABNORMAL HIGH (ref 0–44)
AST: 34 U/L (ref 15–41)
Albumin: 3.7 g/dL (ref 3.5–5.0)
Alkaline Phosphatase: 79 U/L (ref 38–126)
Anion gap: 13 (ref 5–15)
BUN: 21 mg/dL — ABNORMAL HIGH (ref 6–20)
CO2: 27 mmol/L (ref 22–32)
Calcium: 9.1 mg/dL (ref 8.9–10.3)
Chloride: 93 mmol/L — ABNORMAL LOW (ref 98–111)
Creatinine, Ser: 1.37 mg/dL — ABNORMAL HIGH (ref 0.61–1.24)
GFR calc Af Amer: >60 mL/min (ref >60)
GFR calc non Af Amer: 59 mL/min — ABNORMAL LOW (ref >60)
Glucose, Bld: 117 mg/dL — ABNORMAL HIGH (ref 70–99)
Potassium: 3.4 mmol/L — ABNORMAL LOW (ref 3.5–5.1)
Sodium: 133 mmol/L — ABNORMAL LOW (ref 135–145)
Total Bilirubin: 1.2 mg/dL (ref 0.3–1.2)
Total Protein: 7.6 g/dL (ref 6.5–8.1)

## 2018-12-12 LAB — CBC WITH DIFFERENTIAL/PLATELET
Abs Immature Granulocytes: 0.07 10*3/uL (ref 0.00–0.07)
Basophils Absolute: 0.1 10*3/uL (ref 0.0–0.1)
Basophils Relative: 1 %
Eosinophils Absolute: 0.6 10*3/uL — ABNORMAL HIGH (ref 0.0–0.5)
Eosinophils Relative: 5 %
HCT: 49 % (ref 39.0–52.0)
Hemoglobin: 16 g/dL (ref 13.0–17.0)
Immature Granulocytes: 1 %
Lymphocytes Relative: 24 %
Lymphs Abs: 2.7 10*3/uL (ref 0.7–4.0)
MCH: 29.6 pg (ref 26.0–34.0)
MCHC: 32.7 g/dL (ref 30.0–36.0)
MCV: 90.6 fL (ref 80.0–100.0)
Monocytes Absolute: 1 10*3/uL (ref 0.1–1.0)
Monocytes Relative: 9 %
Neutro Abs: 6.9 10*3/uL (ref 1.7–7.7)
Neutrophils Relative %: 60 %
Platelets: 238 10*3/uL (ref 150–400)
RBC: 5.41 MIL/uL (ref 4.22–5.81)
RDW: 13.1 % (ref 11.5–15.5)
WBC: 11.4 10*3/uL — ABNORMAL HIGH (ref 4.0–10.5)
nRBC: 0 % (ref 0.0–0.2)

## 2018-12-12 LAB — PHOSPHORUS: Phosphorus: 4.6 mg/dL (ref 2.5–4.6)

## 2018-12-12 LAB — MAGNESIUM: Magnesium: 2 mg/dL (ref 1.7–2.4)

## 2018-12-12 LAB — T4, FREE: Free T4: 0.65 ng/dL (ref 0.61–1.12)

## 2018-12-12 MED ORDER — FUROSEMIDE 40 MG PO TABS
40.0000 mg | ORAL_TABLET | Freq: Every day | ORAL | Status: DC
  Administered 2018-12-13 – 2018-12-14 (×2): 40 mg via ORAL
  Filled 2018-12-12 (×2): qty 1

## 2018-12-12 MED ORDER — POTASSIUM CHLORIDE CRYS ER 20 MEQ PO TBCR
40.0000 meq | EXTENDED_RELEASE_TABLET | Freq: Two times a day (BID) | ORAL | Status: AC
  Administered 2018-12-12 (×2): 40 meq via ORAL
  Filled 2018-12-12 (×2): qty 2

## 2018-12-12 NOTE — Progress Notes (Signed)
Patient's HR down to 40's non sustained, pt asleep. Pt on betablocker. Will continue to monitor

## 2018-12-12 NOTE — Progress Notes (Signed)
Page to MD s/p CCMD call re Jonathan King. FYI pt had 9 beat Vtach per CCMD *pt denies sx & reports brushing teeth*

## 2018-12-12 NOTE — Progress Notes (Signed)
   Vital Signs MEWS/VS Documentation      12/12/2018 0755 12/12/2018 1036 12/12/2018 1044 12/12/2018 1206   MEWS Score:  0  0  0  1   MEWS Score Color:  Green  Green  Green  Green   Resp:  -  -  -  20   Pulse:  -  94  -  66   BP:  -  112/85  -  92/74   Temp:  -  -  -  98.2 F (36.8 C)   O2 Device:  -  -  -  Room Air   Level of Consciousness:  Alert  -  Alert  -           Maud Deed Tobias-Diakun 12/12/2018,12:57 PM

## 2018-12-12 NOTE — Progress Notes (Signed)
PROGRESS NOTE    GEARL BARATTA  OZH:[id number] DOB: Aug 22, 1965 DOA: 12/09/2018 PCP: Patient, No Pcp Per   Brief Narrative:  The patient is a 53 year old Caucasian obese male with no real appreciable past medical history except history of intermittent alcohol abuse who presents with a chief complaint of grossly worsening shortness of breath, nonproductive cough as well as abdominal discomfort and swelling.  He states that he has had trouble laying flat at night and tosses and turns but denies any lower extremity swelling.  He states that symptoms started about a month ago insidiously and have progressively worsened.  Also noted to have some mild abdominal discomfort and nausea over the same interval but was recently prescribed Protonix and Zofran and states that this is alleviated his abdominal discomfort.    He presented to the ED and was found to be afebrile and saturating 90% on room air while at rest but was slightly tachypneic.  Chest x-ray was notable for increased cardiac silhouette from prior and BNP was elevated at 1200.8.  Of note SARS-CoV-2 testing was still pending.  CHF of unknown type given his persistently worsening shortness of breath with orthopnea and elevated BNP along with increased cardiac silhouette.  He was started on diuresis and echocardiogram ordered.  Echocardiogram was done and was severely abnormal and showed an EF of 15 to 20% with severe global hypokinesis with moderately dilated left ventricle and grade 3 diastolic dysfunction with elevated left filling pressures, moderate right ventricular hypokinesis as well as mild LAE, moderate Mitral Regurg and trivial AI and dilated IVC.   Cardiology wasconsulted and recommending changing diuresis because of worsening renal function and planning on cathing the patient on Monday to rule out ischemia. Unfortunately the patient is very averse to getting a cath so he will undergo a stress Myoview test tomorrow.  Diuresis is now stopped  because of worsening renal function and because the patient appears dry.  Patient continues to have frequent PVCs including ventricular bigeminy and cardiology considering antiarrhythmics given that the low-dose beta-blocker is ineffective and because the patient is bradycardic.  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  Cardiology recommending starting p.o. Lasix tomorrow -Checked Echocardiogram and showed LVEF 15-20%, severe global hypokinesis with moderately dilated LV, grade 3 DD with elevated LV filling pressure, moderate RV hypokinesis, mild LAE, moderate MR, trivial AI, dilated IVC -Continue to monitor renal function and electrolytes during diuresis -Continue to Hold on ACE currently and start Entresto Likely when renal function improves; BB to be started and Dr. Debara Pickett and I discussed this and he states the patient will be started on Carvedilol 3.125 mg twice daily given his frequent PVCs and nonsustained V. tach -Strict I's and O's and daily weights and fluid restrict to 1500 mL's -Patient is -5.455 L and weight is down 1 pound since yesterday -Cycle cardiac troponin and high-sensitivity to rule  out ischemia as a cause for suspected CHF; high-sensitivity troponins have relatively been flat -Continue monitor volume status carefully    -Cardiology is consulted for further evaluation and Dr. Debara Pickett feels that the patient has a new onset cardiomyopathy of non-known etiology and likely is nonischemic and appears dilated but recommends a right and left heart cath on Monday to exclude ischemia; patient does not want to proceed with catheterization but is agreeable to a Lexiscan Myoview in the morning  Hyperglycemia in the setting of Pre-Diabetes -Patient's blood sugar on admission was 116 and repeat was 126; CBGs have been running from 105-126 and today was 117 on CMP -Checked Hemoglobin A1c and was 5.9 -Continue to monitor and trend blood sugars carefully and if necessary will need to add sensitive NovoLog sliding scale insulin AC  ? Pericardial Effusion, ruled out -There is mention of the possible pericardial effusion on chest x-ray -Echocardiogram done showed there is no evidence of any pericardial effusion  NSVT -Has been having frequent PVCs and a 3 beat run of V. tach -Cardiology planning on starting low-dose beta-blocker with carvedilol 3.125 mg p.o. twice daily  -We will need to Continue to monitor telemetry  GERD -C/w Pantoprazole 40 mg po Daily   Cough -In the setting of pulmonary edema and CHF -Continue with benzonatate and guaifenesin  AKI/Renal Insufficiency, slightly worsened -Patient's BUN/creatinine are seen with diuresis and went from 16/1.16 -> 15/1.22 -> 18/1.32 -> 21/1.37 -Decreased Diuresis to IV 40 mg Daily yesterday and has now been stopped cardiology -Avoid Nephrotoxic Medications, Contrast Dyes, as well as Hypotension if possible -Continue to Monitor and Trend Renal Fxn -Repeat CMP in AM    Hyponatremia, hypochloremia -In the setting of diuresis -Mild and sodium is now 133 and chloride is now 93 -Continue monitor and trend and IV diuresis now been stopped -Repeat CMP in the a.m.  Hypokalemia -In the setting of diuresis -Patient potassium this morning was 3.4 -Replete with p.o. potassium  chloride 40 mg twice daily x2 doses -Continue monitor replete as necessary -Repeat CMP in a.m.  Elevated LFT -AST was 34 and ALT is slightly elevated at 45 -Likely reactive and will continue to monitor and trend -Repeat CMP in a.m.  Leukocytosis -Mildly elevated at 11.4 -Continue to monitor for signs and symptoms of infection; currently no evidence of infection exhibited and patient is afebrile -Repeat CBC in a.m.  Elevated TSH; suspected subclinical hypothyroidism -Likely reactive and TSH was 9.490 -T4 was within normal limits at 0.65 but is on the low end of normal -Check free T3  -We will hold off on treating and continue to monitor and recommend repeating LFTs in 4 to 6 weeks  DVT prophylaxis: Enoxaparin 40 mg sq q24h Code Status: FULL CODE Family Communication: No family present at bedside  Disposition Plan: Remain inpatient and will change to inpatient status given that he will be here being diuresed and with possible cath on Monday  Consultants:   Cardiology  Procedures:  ECHOCARDIOGRAM IMPRESSIONS    1. The left ventricle has a visually estimated ejection fraction of 15-20%. The cavity size was moderately dilated. Findings are consistent with dilated cardiomyopathy. Left ventricular diastolic Doppler parameters are consistent with restrictive  filling. Elevated left atrial and left ventricular end-diastolic pressures Left ventricular diffuse hypokinesis.  2. The right ventricle has moderately reduced systolic function. The cavity was normal. There is no increase in right ventricular wall thickness.  3. Left atrial size was mildly dilated.  4. The mitral valve is  abnormal. Mild thickening of the mitral valve leaflet. Mitral valve regurgitation is moderate by color flow Doppler.  5. The tricuspid valve is grossly normal.  6. The aortic valve is abnormal. Mild thickening of the aortic valve. Aortic valve regurgitation is trivial by color flow Doppler. No stenosis of the  aortic valve.  7. The aorta is normal unless otherwise noted.  8. The inferior vena cava was dilated in size with <50% respiratory variability.  SUMMARY   LVEF 15-20%, severe global hypokinesis with moderately dilated LV, grade 3 DD with elevated LV filling pressure, moderate RV hypokinesis, mild LAE, moderate MR, trivial AI, dilated IVC  FINDINGS  Left Ventricle: The left ventricle has a visually estimated ejection fraction of 15-20. The cavity size was moderately dilated. There is no increase in left ventricular wall thickness. Findings are consistent with dilated cardiomyopathy. Left  ventricular diastolic Doppler parameters are consistent with restrictive filling. Elevated left atrial and left ventricular end-diastolic pressures Left ventricular diffuse hypokinesis.  Right Ventricle: The right ventricle has moderately reduced systolic function. The cavity was normal. There is no increase in right ventricular wall thickness.  Left Atrium: Left atrial size was mildly dilated.  Right Atrium: Right atrial size was normal in size. Right atrial pressure is estimated at 10 mmHg.  Interatrial Septum: No atrial level shunt detected by color flow Doppler.  Pericardium: There is no evidence of pericardial effusion.  Mitral Valve: The mitral valve is abnormal. Mild thickening of the mitral valve leaflet. Mitral valve regurgitation is moderate by color flow Doppler.  Tricuspid Valve: The tricuspid valve is grossly normal. Tricuspid valve regurgitation was not visualized by color flow Doppler.  Aortic Valve: The aortic valve is abnormal Mild thickening of the aortic valve. Aortic valve regurgitation is trivial by color flow Doppler. There is No stenosis of the aortic valve.  Pulmonic Valve: The pulmonic valve was grossly normal. Pulmonic valve regurgitation is not visualized by color flow Doppler.  Aorta: The aorta is normal unless otherwise noted.  Venous: The inferior vena cava  is dilated in size with less than 50% respiratory variability.    +--------------+--------++  LEFT VENTRICLE                 +--------------+---------++ +--------------+--------++       Diastology                  PLAX 2D                        +--------------+---------++ +--------------+--------++       LV e' lateral: 6.31 cm/s    LV EF:         15-20 %         +--------------+---------++ +--------------+--------++       LV e' medial:  5.66 cm/s    LVIDd:         6.50 cm         +--------------+---------++ +--------------+--------++  LVIDs:         5.50 cm    +--------------+--------++  LV PW:         0.90 cm    +--------------+--------++  LV IVS:        0.80 cm    +--------------+--------++  LVOT diam:     2.30 cm    +--------------+--------++  LV SV:         69 ml      +--------------+--------++  LV SV Index:   32.20      +--------------+--------++  LVOT Area:     4.15 cm   +--------------+--------++                            +--------------+--------++   +------------------+---------++  LV Volumes (MOD)               +------------------+---------++  LV area d, A4C:    46.30 cm   +------------------+---------++  LV area s, A4C:    40.40 cm   +------------------+---------++  LV major d, A4C:   9.68 cm     +------------------+---------++  LV major s, A4C:   8.84 cm     +------------------+---------++  LV vol d, MOD A4C: 180.0 ml    +------------------+---------++  LV vol s, MOD A4C: 150.0 ml    +------------------+---------++  LV SV MOD A4C:     180.0 ml    +------------------+---------++  +---------------+------++  RIGHT VENTRICLE          +---------------+------++  TAPSE (M-mode): 1.5 cm   +---------------+------++  +---------------+-------++-----------++  LEFT ATRIUM              Index         +---------------+-------++-----------++  LA diam:        4.80 cm  2.28 cm/m    +---------------+-------++-----------++  LA Vol (A2C):   81.0 ml  38.45  ml/m   +---------------+-------++-----------++  LA Vol (A4C):   74.3 ml  35.27 ml/m   +---------------+-------++-----------++  LA Biplane Vol: 78.6 ml  37.32 ml/m   +---------------+-------++-----------++ +------------+----------++-----------++  RIGHT ATRIUM             Index         +------------+----------++-----------++  RA Pressure: 15.00 mmHg                +------------+----------++-----------++  RA Area:     16.70 cm                 +------------+----------++-----------++  RA Volume:   43.70 ml    20.75 ml/m   +------------+----------++-----------++    +-------------+-------++  AORTA                   +-------------+-------++  Ao Root diam: 3.20 cm   +-------------+-------++  +---------------+----------++  TRICUSPID VALVE              +---------------+----------++  Estimated RAP:  15.00 mmHg   +---------------+----------++   +--------------+-------+  SHUNTS                  +--------------+-------+  Systemic Diam: 2.30 cm  +--------------+-------+   Antimicrobials:  Anti-infectives (From admission, onward)   None     Subjective: Seen and examined at bedside and states that he was doing better today and denied any chest pain, lightheadedness or dizziness, shortness of breath.  Highly against cardiac catheterization and worried about having a stroke and adamantly refusing.  No nausea or vomiting.  Agreeable to a stress Myoview.  No other concerns or complaints at this time.  Objective: Vitals:   12/12/18 0038 12/12/18 0112 12/12/18 0430 12/12/18 1036  BP: 94/71  107/86 112/85  Pulse: (!) 47 62 (!) 58 94  Resp: 18  20   Temp: 98.5 F (36.9 C)  98.2 F (36.8 C)   TempSrc: Oral  Oral   SpO2: 94%  92%   Weight:   87.7 kg   Height:        Intake/Output Summary (Last 24 hours) at 12/12/2018 1100  Last data filed at 12/12/2018 1043 Gross per 24 hour  Intake 1200 ml  Output 1700 ml  Net -500 ml   Filed Weights   12/10/18 1212 12/11/18 0526 12/12/18  0430  Weight: 76.6 kg 88.3 kg 87.7 kg   Examination: Physical Exam:  Constitutional: Well-nourished, well-developed overweight Caucasian male currently in no acute distress sitting up in bed playing on his phone Eyes: Lids and conjunctive are normal.  Sclera anicteric ENMT: External ears and nose appear normal.  Grossly normal hearing Neck: Appears supple no JVD Respiratory: Diminished auscultation bilaterally but crackles have improved and there is no appreciable wheezing, rales, rhonchi.  Patient was not tachypneic or using any accessory muscles to breathe Cardiovascular: Regular rate and rhythm but slightly on the slower side.  No extremity edema noted today Abdomen: Soft, nontender, slightly distended.  Bowel sounds present GU: Deferred Musculoskeletal: No contractures or cyanosis.  No joint deformities in the upper lower extremities noted Skin: Skin is warm and dry with no appreciable rashes or lesions on to skin evaluation Neurologic: Cranial nerves II through XII grossly intact but patient does stutter.  Romberg sign cerebellar reflexes were not Psychiatric: Normal judgment and insight.  Patient is awake, alert, oriented x3 and not as anxious today  Data Reviewed: I have personally reviewed following labs and imaging studies  CBC: Recent Labs  Lab 12/09/18 1947 12/10/18 1154 12/11/18 0511 12/12/18 0519  WBC 8.1 9.8 10.5 11.4*  NEUTROABS  --  6.7 6.0 6.9  HGB 15.0 14.6 16.4 16.0  HCT 47.3 45.9 50.5 49.0  MCV 94.6 93.3 91.8 90.6  PLT 241 220 228 481   Basic Metabolic Panel: Recent Labs  Lab 12/09/18 1947 12/10/18 1154 12/10/18 1925 12/11/18 0511 12/12/18 0519  NA 139 137 138 137 133*  K 4.0 3.9 3.7 4.3 3.4*  CL 105 99 97* 98 93*  CO2 23 29 28 29 27   GLUCOSE 126* 116* 114* 105* 117*  BUN 20 16 15 18  21*  CREATININE 1.20 1.16 1.22 1.32* 1.37*  CALCIUM 9.0 8.9 9.5 9.5 9.1  MG  --  2.1 2.1 2.1 2.0  PHOS  --  3.1  --  4.2 4.6   GFR: Estimated Creatinine  Clearance: 69.2 mL/min (A) (by C-G formula based on SCr of 1.37 mg/dL (H)). Liver Function Tests: Recent Labs  Lab 12/09/18 1947 12/10/18 1154 12/11/18 0511 12/12/18 0519  AST 23 21 27  34  ALT 32 29 34 45*  ALKPHOS 77 76 73 79  BILITOT 0.7 0.7 0.8 1.2  PROT 7.4 7.2 7.9 7.6  ALBUMIN 3.8 3.7 3.7 3.7   Recent Labs  Lab 12/09/18 1947  LIPASE 31   No results for input(s): AMMONIA in the last 168 hours. Coagulation Profile: No results for input(s): INR, PROTIME in the last 168 hours. Cardiac Enzymes: No results for input(s): CKTOTAL, CKMB, CKMBINDEX, TROPONINI in the last 168 hours. BNP (last 3 results) No results for input(s): PROBNP in the last 8760 hours. HbA1C: Recent Labs    12/11/18 0511  HGBA1C 5.9*   CBG: No results for input(s): GLUCAP in the last 168 hours. Lipid Profile: No results for input(s): CHOL, HDL, LDLCALC, TRIG, CHOLHDL, LDLDIRECT in the last 72 hours. Thyroid Function Tests: Recent Labs    12/11/18 1400 12/12/18 0811  TSH 9.490*  --   FREET4  --  0.65   Anemia Panel: No results for input(s): VITAMINB12, FOLATE, FERRITIN, TIBC, IRON, RETICCTPCT in the last 72 hours. Sepsis Labs: No results for  input(s): PROCALCITON, LATICACIDVEN in the last 168 hours.  Recent Results (from the past 240 hour(s))  SARS CORONAVIRUS 2 Nasal Swab Aptima Multi Swab     Status: None   Collection Time: 12/10/18  4:43 AM   Specimen: Aptima Multi Swab; Nasal Swab  Result Value Ref Range Status   SARS Coronavirus 2 NEGATIVE NEGATIVE Final    Comment: (NOTE) SARS-CoV-2 target nucleic acids are NOT DETECTED. The SARS-CoV-2 RNA is generally detectable in upper and lower respiratory specimens during the acute phase of infection. Negative results do not preclude SARS-CoV-2 infection, do not rule out co-infections with other pathogens, and should not be used as the sole basis for treatment or other patient management decisions. Negative results must be combined with  clinical observations, patient history, and epidemiological information. The expected result is Negative. Fact Sheet for Patients: SugarRoll.be Fact Sheet for Healthcare Providers: https://www.woods-mathews.com/ This test is not yet approved or cleared by the [place] FDA and  has been authorized for detection and/or diagnosis of SARS-CoV-2 by FDA under an Emergency Use Authorization (EUA). This EUA will remain  in effect (meaning this test can be used) for the duration of the COVID-19 declaration under Section 56 4(b)(1) of the Act, 21 U.S.C. section 360bbb-3(b)(1), unless the authorization is terminated or revoked sooner. Performed at [facility], [place] [address], [place], [place] [address]   MRSA PCR Screening     Status: None   Collection Time: 12/10/18  6:24 PM   Specimen: Nasopharyngeal  Result Value Ref Range Status   MRSA by PCR NEGATIVE NEGATIVE Final    Comment:        The GeneXpert MRSA Assay (FDA approved for NASAL specimens only), is one component of a comprehensive MRSA colonization surveillance program. It is not intended to diagnose MRSA infection nor to guide or monitor treatment for MRSA infections. Performed at [facility], [place] [address], [place], [place] [address]     Radiology Studies: No results found. Scheduled Meds:  carvedilol  3.125 mg Oral BID   enoxaparin (LOVENOX) injection  40 mg Subcutaneous Q24H   [START ON 12/13/2018] furosemide  40 mg Oral Daily   guaiFENesin  1,200 mg Oral BID   pantoprazole  40 mg Oral Daily   potassium chloride  40 mEq Oral BID   sodium chloride flush  3 mL Intravenous Q12H   Continuous Infusions:  sodium chloride      LOS: 1 day   Kerney Elbe, DO [facility] PAGER is on AMION  If 7PM-7AM, please contact night-coverage [email or web address] Password [email or web address] 12/12/2018, 11:00 AM

## 2018-12-12 NOTE — Progress Notes (Signed)
Per CCMD pt with 9 beat run of VTach. Pt denies complaints, remains asymptomatic but reports recent coughing spell and having just brushed his teeth. Page to cardiology as Juluis Rainier.

## 2018-12-12 NOTE — Progress Notes (Signed)
DAILY PROGRESS NOTE   Patient Name: Jonathan King Date of Encounter: 12/12/2018 Cardiologist: No primary care provider on file.  Chief Complaint   Breathing better  Patient Profile   53 yo male with orthopnea, DOE and edema, elevated BNP of 1201, admitted for CHF  Subjective   Diuresed another 1.7L negative overnight - now 5.8L Negative.  Creatinine up today with reduced sodium/chloride and potassium, suggestive of overdiuresis. Frequent PVC's overnight including ventricular bigeminy.  Objective   Vitals:   12/11/18 2055 12/12/18 0038 12/12/18 0112 12/12/18 0430  BP: 111/79 94/71  107/86  Pulse: (!) 49 (!) 47 62 (!) 58  Resp: 18 18  20   Temp: 97.7 F (36.5 C) 98.5 F (36.9 C)  98.2 F (36.8 C)  TempSrc: Oral Oral  Oral  SpO2: 93% 94%  92%  Weight:    87.7 kg  Height:        Intake/Output Summary (Last 24 hours) at 12/12/2018 0944 Last data filed at 12/12/2018 0851 Gross per 24 hour  Intake 960 ml  Output 2500 ml  Net -1540 ml   Filed Weights   12/10/18 1212 12/11/18 0526 12/12/18 0430  Weight: 76.6 kg 88.3 kg 87.7 kg    Physical Exam   General appearance: alert and no distress Lungs: clear to auscultation bilaterally Heart: regular rate and rhythm, S1, S2 normal, no murmur, click, rub or gallop Extremities: extremities normal, atraumatic, no cyanosis or edema Neurologic: Grossly normal  Inpatient Medications    Scheduled Meds: . carvedilol  3.125 mg Oral BID  . enoxaparin (LOVENOX) injection  40 mg Subcutaneous Q24H  . furosemide  40 mg Intravenous Daily  . guaiFENesin  1,200 mg Oral BID  . pantoprazole  40 mg Oral Daily  . potassium chloride  40 mEq Oral BID  . sodium chloride flush  3 mL Intravenous Q12H    Continuous Infusions: . sodium chloride      PRN Meds: sodium chloride, acetaminophen, benzonatate, ondansetron (ZOFRAN) IV, sodium chloride flush   Labs   Results for orders placed or performed during the hospital encounter of  12/09/18 (from the past 48 hour(s))  CBC with Differential/Platelet     Status: None   Collection Time: 12/10/18 11:54 AM  Result Value Ref Range   WBC 9.8 4.0 - 10.5 K/uL   RBC 4.92 4.22 - 5.81 MIL/uL   Hemoglobin 14.6 13.0 - 17.0 g/dL   HCT 45.9 39.0 - 52.0 %   MCV 93.3 80.0 - 100.0 fL   MCH 29.7 26.0 - 34.0 pg   MCHC 31.8 30.0 - 36.0 g/dL   RDW 13.2 11.5 - 15.5 %   Platelets 220 150 - 400 K/uL   nRBC 0.0 0.0 - 0.2 %   Neutrophils Relative % 68 %   Neutro Abs 6.7 1.7 - 7.7 K/uL   Lymphocytes Relative 21 %   Lymphs Abs 2.1 0.7 - 4.0 K/uL   Monocytes Relative 7 %   Monocytes Absolute 0.7 0.1 - 1.0 K/uL   Eosinophils Relative 3 %   Eosinophils Absolute 0.3 0.0 - 0.5 K/uL   Basophils Relative 1 %   Basophils Absolute 0.1 0.0 - 0.1 K/uL   Immature Granulocytes 0 %   Abs Immature Granulocytes 0.02 0.00 - 0.07 K/uL    Comment: Performed at [facility], [address], [place], [place] [address]  Comprehensive metabolic panel     Status: Abnormal   Collection Time: 12/10/18 11:54 AM  Result Value Ref  Range   Sodium 137 135 - 145 mmol/L   Potassium 3.9 3.5 - 5.1 mmol/L   Chloride 99 98 - 111 mmol/L   CO2 29 22 - 32 mmol/L   Glucose, Bld 116 (H) 70 - 99 mg/dL   BUN 16 6 - 20 mg/dL   Creatinine, Ser 1.16 0.61 - 1.24 mg/dL   Calcium 8.9 8.9 - 10.3 mg/dL   Total Protein 7.2 6.5 - 8.1 g/dL   Albumin 3.7 3.5 - 5.0 g/dL   AST 21 15 - 41 U/L   ALT 29 0 - 44 U/L   Alkaline Phosphatase 76 38 - 126 U/L   Total Bilirubin 0.7 0.3 - 1.2 mg/dL   GFR calc non Af Amer >60 >60 mL/min   GFR calc Af Amer >60 >60 mL/min   Anion gap 9 5 - 15    Comment: Performed at [facility], [address], [place], [place] [address]  Magnesium     Status: None   Collection Time: 12/10/18 11:54 AM  Result Value Ref Range   Magnesium 2.1 1.7 - 2.4 mg/dL    Comment: Performed at [facility] [address] [place], [place] [address]  Phosphorus     Status: None   Collection Time:  12/10/18 11:54 AM  Result Value Ref Range   Phosphorus 3.1 2.5 - 4.6 mg/dL    Comment: Performed at [facility] [address], [place], [place] [address]  Troponin I (High Sensitivity)     Status: Abnormal   Collection Time: 12/10/18 11:54 AM  Result Value Ref Range   Troponin I (High Sensitivity) 41 (H) <18 ng/L    Comment: (NOTE) Elevated high sensitivity troponin I (hsTnI) values and significant  changes across serial measurements may suggest ACS but many other  chronic and acute conditions are known to elevate hsTnI results.  Refer to the "Links" section for chest pain algorithms and additional  guidance. Performed at [facility], [place] [address], [place], [place] [address]   Troponin I (High Sensitivity)     Status: Abnormal   Collection Time: 12/10/18  1:02 PM  Result Value Ref Range   Troponin I (High Sensitivity) 41 (H) <18 ng/L    Comment: (NOTE) Elevated high sensitivity troponin I (hsTnI) values and significant  changes across serial measurements may suggest ACS but many other  chronic and acute conditions are known to elevate hsTnI results.  Refer to the "Links" section for chest pain algorithms and additional  guidance. Performed at [facility], [place] [address], [place], [place] [address]   MRSA PCR Screening     Status: None   Collection Time: 12/10/18  6:24 PM   Specimen: Nasopharyngeal  Result Value Ref Range   MRSA by PCR NEGATIVE NEGATIVE    Comment:        The GeneXpert MRSA Assay (FDA approved for NASAL specimens only), is one component of a comprehensive MRSA colonization surveillance program. It is not intended to diagnose MRSA infection nor to guide or monitor treatment for MRSA infections. Performed at [facility], [place] [address], [place], [place] [address]   Basic metabolic panel     Status: Abnormal   Collection Time: 12/10/18  7:25 PM  Result Value Ref Range   Sodium 138 135 - 145 mmol/L   Potassium 3.7 3.5 -  5.1 mmol/L   Chloride 97 (L) 98 - 111 mmol/L   CO2 28 22 -  32 mmol/L   Glucose, Bld 114 (H) 70 - 99 mg/dL   BUN 15 6 - 20 mg/dL   Creatinine, Ser 1.22 0.61 - 1.24 mg/dL   Calcium 9.5 8.9 - 10.3 mg/dL   GFR calc non Af Amer >60 >60 mL/min   GFR calc Af Amer >60 >60 mL/min   Anion gap 13 5 - 15    Comment: Performed at [facility] [address] [place], [place] [address]  Magnesium     Status: None   Collection Time: 12/10/18  7:25 PM  Result Value Ref Range   Magnesium 2.1 1.7 - 2.4 mg/dL    Comment: Performed at [facility], [place] [address], [place], [place] [address]  HIV antibody (Routine Testing)     Status: None   Collection Time: 12/11/18  5:11 AM  Result Value Ref Range   HIV Screen 4th Generation wRfx Non Reactive Non Reactive    Comment: (NOTE) Performed At: [facility] [place], [place] [id number] Rush Farmer MD [id number]   Hemoglobin A1c     Status: Abnormal   Collection Time: 12/11/18  5:11 AM  Result Value Ref Range   Hgb A1c MFr Bld 5.9 (H) 4.8 - 5.6 %    Comment: (NOTE) Pre diabetes:          5.7%-6.4% Diabetes:              >6.4% Glycemic control for   <7.0% adults with diabetes    Mean Plasma Glucose 122.63 mg/dL    Comment: Performed at [facility] [address] [place], [place] [address]  Magnesium     Status: None   Collection Time: 12/11/18  5:11 AM  Result Value Ref Range   Magnesium 2.1 1.7 - 2.4 mg/dL    Comment: Performed at [facility] [address], [place], [place] [address]  Phosphorus     Status: None   Collection Time: 12/11/18  5:11 AM  Result Value Ref Range   Phosphorus 4.2 2.5 - 4.6 mg/dL    Comment: Performed at [facility] [address], [place], [place] [address]  Comprehensive metabolic panel     Status: Abnormal   Collection Time: 12/11/18  5:11 AM  Result Value Ref Range   Sodium 137 135 - 145 mmol/L   Potassium 4.3 3.5 - 5.1 mmol/L   Chloride 98 98 -  111 mmol/L   CO2 29 22 - 32 mmol/L   Glucose, Bld 105 (H) 70 - 99 mg/dL   BUN 18 6 - 20 mg/dL   Creatinine, Ser 1.32 (H) 0.61 - 1.24 mg/dL   Calcium 9.5 8.9 - 10.3 mg/dL   Total Protein 7.9 6.5 - 8.1 g/dL   Albumin 3.7 3.5 - 5.0 g/dL   AST 27 15 - 41 U/L   ALT 34 0 - 44 U/L   Alkaline Phosphatase 73 38 - 126 U/L   Total Bilirubin 0.8 0.3 - 1.2 mg/dL   GFR calc non Af Amer >60 >60 mL/min   GFR calc Af Amer >60 >60 mL/min   Anion gap 10 5 - 15    Comment: Performed at [facility] [address], [place], [place] [address]  CBC with Differential/Platelet     Status: None   Collection Time: 12/11/18  5:11 AM  Result Value Ref Range   WBC 10.5 4.0 - 10.5 K/uL   RBC 5.50 4.22 - 5.81  MIL/uL   Hemoglobin 16.4 13.0 - 17.0 g/dL   HCT 50.5 39.0 - 52.0 %   MCV 91.8 80.0 - 100.0 fL   MCH 29.8 26.0 - 34.0 pg   MCHC 32.5 30.0 - 36.0 g/dL   RDW 13.2 11.5 - 15.5 %   Platelets 228 150 - 400 K/uL   nRBC 0.0 0.0 - 0.2 %   Neutrophils Relative % 57 %   Neutro Abs 6.0 1.7 - 7.7 K/uL   Lymphocytes Relative 29 %   Lymphs Abs 3.0 0.7 - 4.0 K/uL   Monocytes Relative 8 %   Monocytes Absolute 0.8 0.1 - 1.0 K/uL   Eosinophils Relative 5 %   Eosinophils Absolute 0.5 0.0 - 0.5 K/uL   Basophils Relative 1 %   Basophils Absolute 0.1 0.0 - 0.1 K/uL   Immature Granulocytes 0 %   Abs Immature Granulocytes 0.04 0.00 - 0.07 K/uL    Comment: Performed at [facility] [address] [place], [place] [address]  TSH     Status: Abnormal   Collection Time: 12/11/18  2:00 PM  Result Value Ref Range   TSH 9.490 (H) 0.350 - 4.500 uIU/mL    Comment: Performed by a 3rd Generation assay with a functional sensitivity of <=0.01 uIU/mL. Performed at [facility], [place] [address], [place], [place] [address]   Magnesium     Status: None   Collection Time: 12/12/18  5:19 AM  Result Value Ref Range   Magnesium 2.0 1.7 - 2.4 mg/dL    Comment: Performed at [facility] [address],  [place], [place] [address]  Phosphorus     Status: None   Collection Time: 12/12/18  5:19 AM  Result Value Ref Range   Phosphorus 4.6 2.5 - 4.6 mg/dL    Comment: Performed at [facility] [address] [place], [place] [address]  CBC with Differential/Platelet     Status: Abnormal   Collection Time: 12/12/18  5:19 AM  Result Value Ref Range   WBC 11.4 (H) 4.0 - 10.5 K/uL   RBC 5.41 4.22 - 5.81 MIL/uL   Hemoglobin 16.0 13.0 - 17.0 g/dL   HCT 49.0 39.0 - 52.0 %   MCV 90.6 80.0 - 100.0 fL   MCH 29.6 26.0 - 34.0 pg   MCHC 32.7 30.0 - 36.0 g/dL   RDW 13.1 11.5 - 15.5 %   Platelets 238 150 - 400 K/uL   nRBC 0.0 0.0 - 0.2 %   Neutrophils Relative % 60 %   Neutro Abs 6.9 1.7 - 7.7 K/uL   Lymphocytes Relative 24 %   Lymphs Abs 2.7 0.7 - 4.0 K/uL   Monocytes Relative 9 %   Monocytes Absolute 1.0 0.1 - 1.0 K/uL   Eosinophils Relative 5 %   Eosinophils Absolute 0.6 (H) 0.0 - 0.5 K/uL   Basophils Relative 1 %   Basophils Absolute 0.1 0.0 - 0.1 K/uL   Immature Granulocytes 1 %   Abs Immature Granulocytes 0.07 0.00 - 0.07 K/uL    Comment: Performed at [facility] [address] [place], [place] [address]  Comprehensive metabolic panel     Status: Abnormal   Collection Time: 12/12/18  5:19 AM  Result Value Ref Range   Sodium 133 (L) 135 - 145 mmol/L   Potassium 3.4 (L) 3.5 - 5.1 mmol/L   Chloride 93 (L) 98 - 111 mmol/L   CO2 27 22 - 32 mmol/L  Glucose, Bld 117 (H) 70 - 99 mg/dL   BUN 21 (H) 6 - 20 mg/dL   Creatinine, Ser 1.37 (H) 0.61 - 1.24 mg/dL   Calcium 9.1 8.9 - 10.3 mg/dL   Total Protein 7.6 6.5 - 8.1 g/dL   Albumin 3.7 3.5 - 5.0 g/dL   AST 34 15 - 41 U/L   ALT 45 (H) 0 - 44 U/L   Alkaline Phosphatase 79 38 - 126 U/L   Total Bilirubin 1.2 0.3 - 1.2 mg/dL   GFR calc non Af Amer 59 (L) >60 mL/min   GFR calc Af Amer >60 >60 mL/min   Anion gap 13 5 - 15    Comment: Performed at [facility] [address], [place], [place] [address]  T4, free     Status:  None   Collection Time: 12/12/18  8:11 AM  Result Value Ref Range   Free T4 0.65 0.61 - 1.12 ng/dL    Comment: (NOTE) Biotin ingestion may interfere with free T4 tests. If the results are inconsistent with the TSH level, previous test results, or the clinical presentation, then consider biotin interference. If needed, order repeat testing after stopping biotin. Performed at [facility], [place] [address] [place], [place] [address]     ECG   N/A   Telemetry   Sinus rhythm - Personally Reviewed  Radiology    No results found.  Cardiac Studies   Procedure: 2D Echo  Indications:    dyspnea 786.09   History:        Patient has no prior history of Echocardiogram examinations.   Sonographer:    Johny Chess Referring Phys: [id number] OMAIR LATIF [place]    1. The left ventricle has a visually estimated ejection fraction of 15-20%. The cavity size was moderately dilated. Findings are consistent with dilated cardiomyopathy. Left ventricular diastolic Doppler parameters are consistent with restrictive  filling. Elevated left atrial and left ventricular end-diastolic pressures Left ventricular diffuse hypokinesis.  2. The right ventricle has moderately reduced systolic function. The cavity was normal. There is no increase in right ventricular wall thickness.  3. Left atrial size was mildly dilated.  4. The mitral valve is abnormal. Mild thickening of the mitral valve leaflet. Mitral valve regurgitation is moderate by color flow Doppler.  5. The tricuspid valve is grossly normal.  6. The aortic valve is abnormal. Mild thickening of the aortic valve. Aortic valve regurgitation is trivial by color flow Doppler. No stenosis of the aortic valve.  7. The aorta is normal unless otherwise noted.  8. The inferior vena cava was dilated in size with <50% respiratory variability.  SUMMARY   LVEF 15-20%, severe global hypokinesis with moderately dilated LV, grade  3 DD with elevated LV filling pressure, moderate RV hypokinesis, mild LAE, moderate MR, trivial AI, dilated IVC  Assessment   1. Principal Problem: 2.   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 Active Problems: 4.   Acute systolic (congestive) heart failure ([facility]) 5.   NSVT (nonsustained ventricular tachycardia) (H[facility]) 6.   Pre-diabetes 7.   GERD (gastroesophageal reflux disease) 8.   Cough 9.   Plan   At diuresis endpoint - will d/c IV lasix today, switch to po lasix tomorrow. Discussed cath with him tomorrow and he is not agreeable to it - worried about risks. 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 Noted to have frequent PVC's  and bigeminy overnight - this could also be the cause of his cardiomyopathy.  Time Spent Directly with Patient:  I have spent a total of 25 minutes with the patient reviewing hospital notes, telemetry, EKGs, labs and examining the patient as well as establishing an assessment and plan that was discussed personally with the patient.  > 50% of time was spent in direct patient care.  Length of Stay:  LOS: 1 day   Pixie Casino, MD, [facility], [place] Director of the Advanced Lipid Disorders &  Cardiovascular Risk Reduction Clinic Diplomate of the American Board of Clinical Lipidology Attending Cardiologist  Direct Dial: [phone]  Fax: [phone]  Website:  [email or web address] [place] 12/12/2018, 9:44 AM

## 2018-12-12 NOTE — Progress Notes (Signed)
Patient resting comfortably during shift report. Denies complaints other than desire to rest.

## 2018-12-13 ENCOUNTER — Inpatient Hospital Stay (HOSPITAL_COMMUNITY): Payer: Self-pay

## 2018-12-13 DIAGNOSIS — I429 Cardiomyopathy, unspecified: Secondary | ICD-10-CM

## 2018-12-13 DIAGNOSIS — R0602 Shortness of breath: Secondary | ICD-10-CM

## 2018-12-13 LAB — COMPREHENSIVE METABOLIC PANEL
ALT: 34 U/L (ref 0–44)
AST: 25 U/L (ref 15–41)
Albumin: 3.6 g/dL (ref 3.5–5.0)
Alkaline Phosphatase: 78 U/L (ref 38–126)
Anion gap: 10 (ref 5–15)
BUN: 19 mg/dL (ref 6–20)
CO2: 25 mmol/L (ref 22–32)
Calcium: 9 mg/dL (ref 8.9–10.3)
Chloride: 101 mmol/L (ref 98–111)
Creatinine, Ser: 1.26 mg/dL — ABNORMAL HIGH (ref 0.61–1.24)
GFR calc Af Amer: >60 mL/min (ref >60)
GFR calc non Af Amer: >60 mL/min (ref >60)
Glucose, Bld: 134 mg/dL — ABNORMAL HIGH (ref 70–99)
Potassium: 3.8 mmol/L (ref 3.5–5.1)
Sodium: 136 mmol/L (ref 135–145)
Total Bilirubin: 1.1 mg/dL (ref 0.3–1.2)
Total Protein: 7.1 g/dL (ref 6.5–8.1)

## 2018-12-13 LAB — CBC WITH DIFFERENTIAL/PLATELET
Abs Immature Granulocytes: 0.04 10*3/uL (ref 0.00–0.07)
Basophils Absolute: 0.1 10*3/uL (ref 0.0–0.1)
Basophils Relative: 1 %
Eosinophils Absolute: 0.4 10*3/uL (ref 0.0–0.5)
Eosinophils Relative: 4 %
HCT: 48.9 % (ref 39.0–52.0)
Hemoglobin: 15.8 g/dL (ref 13.0–17.0)
Immature Granulocytes: 0 %
Lymphocytes Relative: 22 %
Lymphs Abs: 2.1 10*3/uL (ref 0.7–4.0)
MCH: 29.5 pg (ref 26.0–34.0)
MCHC: 32.3 g/dL (ref 30.0–36.0)
MCV: 91.4 fL (ref 80.0–100.0)
Monocytes Absolute: 0.8 10*3/uL (ref 0.1–1.0)
Monocytes Relative: 9 %
Neutro Abs: 6.1 10*3/uL (ref 1.7–7.7)
Neutrophils Relative %: 64 %
Platelets: 243 10*3/uL (ref 150–400)
RBC: 5.35 MIL/uL (ref 4.22–5.81)
RDW: 13.1 % (ref 11.5–15.5)
WBC: 9.6 10*3/uL (ref 4.0–10.5)
nRBC: 0 % (ref 0.0–0.2)

## 2018-12-13 LAB — NM MYOCAR MULTI W/SPECT W/WALL MOTION / EF
MPHR: 168 {beats}/min
Peak HR: 123 {beats}/min
Percent HR: 73 %
Rest HR: 101 {beats}/min

## 2018-12-13 LAB — PHOSPHORUS: Phosphorus: 3.3 mg/dL (ref 2.5–4.6)

## 2018-12-13 LAB — MAGNESIUM: Magnesium: 2.1 mg/dL (ref 1.7–2.4)

## 2018-12-13 LAB — T3: T3, Total: 132 ng/dL (ref 71–180)

## 2018-12-13 MED ORDER — ATORVASTATIN CALCIUM 80 MG PO TABS
80.0000 mg | ORAL_TABLET | Freq: Every day | ORAL | Status: DC
  Administered 2018-12-13 – 2018-12-29 (×16): 80 mg via ORAL
  Filled 2018-12-13 (×16): qty 1

## 2018-12-13 MED ORDER — LOSARTAN POTASSIUM 25 MG PO TABS
25.0000 mg | ORAL_TABLET | Freq: Every day | ORAL | Status: DC
  Administered 2018-12-13 – 2018-12-20 (×8): 25 mg via ORAL
  Filled 2018-12-13 (×8): qty 1

## 2018-12-13 MED ORDER — SPIRONOLACTONE 12.5 MG HALF TABLET
12.5000 mg | ORAL_TABLET | Freq: Every day | ORAL | Status: DC
  Administered 2018-12-13 – 2018-12-16 (×4): 12.5 mg via ORAL
  Filled 2018-12-13 (×4): qty 1

## 2018-12-13 MED ORDER — TECHNETIUM TC 99M TETROFOSMIN IV KIT
10.0000 | PACK | Freq: Once | INTRAVENOUS | Status: AC | PRN
  Administered 2018-12-13: 09:00:00 10 via INTRAVENOUS

## 2018-12-13 MED ORDER — ASPIRIN EC 81 MG PO TBEC
81.0000 mg | DELAYED_RELEASE_TABLET | Freq: Every day | ORAL | Status: DC
  Administered 2018-12-13 – 2018-12-20 (×8): 81 mg via ORAL
  Filled 2018-12-13 (×8): qty 1

## 2018-12-13 MED ORDER — TECHNETIUM TC 99M MEBROFENIN IV KIT
5.0000 | PACK | Freq: Once | INTRAVENOUS | Status: AC | PRN
  Administered 2018-12-13: 5 via INTRAVENOUS

## 2018-12-13 MED ORDER — TECHNETIUM TC 99M TETROFOSMIN IV KIT
30.0000 | PACK | Freq: Once | INTRAVENOUS | Status: AC | PRN
  Administered 2018-12-13: 30 via INTRAVENOUS

## 2018-12-13 MED ORDER — POTASSIUM CHLORIDE CRYS ER 20 MEQ PO TBCR
40.0000 meq | EXTENDED_RELEASE_TABLET | Freq: Once | ORAL | Status: AC
  Administered 2018-12-13: 40 meq via ORAL
  Filled 2018-12-13: qty 2

## 2018-12-13 MED ORDER — REGADENOSON 0.4 MG/5ML IV SOLN
INTRAVENOUS | Status: AC
  Filled 2018-12-13: qty 5

## 2018-12-13 MED ORDER — REGADENOSON 0.4 MG/5ML IV SOLN
0.4000 mg | Freq: Once | INTRAVENOUS | Status: AC
  Administered 2018-12-13: 0.4 mg via INTRAVENOUS

## 2018-12-13 NOTE — Progress Notes (Signed)
Central telemetry called to notified me that patient had a 23 beat run on VT and SVT; checked on patient and he is asymptomatic, no complaints of chest pain, shortness of breath Neta Mends RN 11:24 PM 12-13-2018

## 2018-12-13 NOTE — Progress Notes (Signed)
PROGRESS NOTE    [name]  IRJ:[id number] DOB: 03-17-1966 DOA: 12/09/2018 PCP: Patient, No Pcp Per   Brief Narrative:  The patient is a 53 year old Caucasian obese male with no real appreciable past medical history except history of intermittent alcohol abuse who presents with a chief complaint of grossly worsening shortness of breath, nonproductive cough as well as abdominal discomfort and swelling.  He states that he has had trouble laying flat at night and tosses and turns but denies any lower extremity swelling.  He states that symptoms started about a month ago insidiously and have progressively worsened.  Also noted to have some mild abdominal discomfort and nausea over the same interval but was recently prescribed Protonix and Zofran and states that this is alleviated his abdominal discomfort.    He presented to the ED and was found to be afebrile and saturating 90% on room air while at rest but was slightly tachypneic.  Chest x-ray was notable for increased cardiac silhouette from prior and BNP was elevated at 1200.8.  Of note SARS-CoV-2 testing was still pending.  CHF of unknown type given his persistently worsening shortness of breath with orthopnea and elevated BNP along with increased cardiac silhouette.  He was started on diuresis and echocardiogram ordered.  Echocardiogram was done and was severely abnormal and showed an EF of 15 to 20% with severe global hypokinesis with moderately dilated left ventricle and grade 3 diastolic dysfunction with elevated left filling pressures, moderate right ventricular hypokinesis as well as mild LAE, moderate Mitral Regurg and trivial AI and dilated IVC.   Cardiology wasconsulted and recommending changing diuresis because of worsening renal function and planning on cathing the patient on Monday to rule out ischemia. Unfortunately the patient is very averse to getting a cardiac cath so he will undergo a stress Myoview test and this was done today and  still pending read.  IV Diuresis is now stopped because of worsening renal function and because the patient appeared dry so was changed to po this AM. Patient continues to have frequent PVCs including ventricular bigeminy and cardiology considering antiarrhythmics given that the low-dose beta-blocker is ineffective and because the patient is bradycardic.  Cardiology has also added spironolactone 12.5 mg p.o. daily as well as losartan 25 mg p.o. daily S Dr. Stanford Breed does not think the patient can tolerate Entresto at this point and recommending considering it as an outpatient.  They will consider a cardiac MRI after discharge and referral to the CHF clinic in a recommending a follow-up echocardiogram in 3 months.  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) Active Problems:   Acute systolic (congestive) heart failure (HCC)   NSVT (nonsustained ventricular tachycardia) (HCC)   Pre-diabetes   GERD (gastroesophageal reflux disease)   Cough   Hypokalemia   Hyponatremia   Hypochloremia   Elevated ALT measurement   Leukocytosis   Elevated TSH   Acute Combined Systolic and Diastolic CHFExacerbation, improved Cardiomyopathy, Unclear Etiology  -Presents with insidiously worsening SOB with orthopnea, no swelling, no fever, and no chest pain -BNP is elevated to 1201 and cardiac silhouette on CXR is increased from prior -Started Diureses with IV Lasix 40 mg BID and this was changed to IV daily and now completely stopped and patient was transitioned to p.o. Lasix 40 mg p.o. daily altogether.  Cardiology recommending starting p.o. Lasix today at 40 mg p.o. daily -Checked Echocardiogram and showed LVEF 15-20%, severe global hypokinesis  with moderately dilated LV, grade 3 DD with elevated LV filling pressure, moderate RV hypokinesis, mild LAE, moderate MR, trivial AI, dilated IVC -Continue to monitor renal function and electrolytes during  diuresis -Continue to Hold on ACE currently and start Entresto Likely when renal function improves; BB to be started and Dr. Debara Pickett and I discussed this and he states the patient will be started on Carvedilol 3.125 mg twice daily given his frequent PVCs and nonsustained V. tach -Strict I's and O's and daily weights and fluid restrict to 1500 mL's -Patient is - 7.260 L and weight is down 1 pound since yesterday -Cycle cardiac troponin and high-sensitivity to rule out ischemia as a cause for suspected CHF; high-sensitivity troponins have relatively been flat -Continue monitor volume status carefully  -Cardiology is consulted for further evaluation and Dr. Debara Pickett feels that the patient has a new onset cardiomyopathy of non-known etiology and likely is nonischemic and appears dilated but recommends a right and left heart cath on Monday to exclude ischemia; patient does not want to proceed with catheterization but is agreeable to a [place] which was done today -The [employer] results are still pending Cardiology starting spironolactone 12.5 mg p.o. daily, losartan 25 mg p.o. daily as well -Dr. Stanford Breed feels that the patient blood pressure at this point cannot tolerate Entresto and are recommending to be changed in the outpatient setting if possible -Cardiology also recommending following up with an echocardiogram after 3 months of medications for titrated to reassess left ventricular function as well as considering a cardiac MRI after discharge along with referral to CHF clinic -Patient will need fluid restriction and low-sodium diet at discharge as well  Hyperglycemia in the setting of Pre-Diabetes -Patient's blood sugar on admission was 116 and repeat was 126; CBGs have been running from 105-126 and today was 134 on CMP -Checked Hemoglobin A1c and was 5.9 -Continue to monitor and trend blood sugars carefully and if necessary will need to add sensitive NovoLog sliding scale insulin AC  ?  Pericardial Effusion, ruled out -There is mention of the possible pericardial effusion on chest x-ray -Echocardiogram done showed there is no evidence of any pericardial effusion  NSVT -Has been having frequent PVCs and a 3 beat run of V. tach -Cardiology planning on starting low-dose beta-blocker with carvedilol 3.125 mg p.o. twice daily and recommending continuing for now and titrating as an outpatient given no history of syncope -We will need to Continue to monitor telemetry  GERD -C/w Pantoprazole 40 mg po Daily   Cough -In the setting of pulmonary edema and CHF, improving -Continue with benzonatate and guaifenesin  AKI/Renal Insufficiency, improved now -Patient's BUN/creatinine are seen with diuresis and went from 16/1.16 -> 15/1.22 -> 18/1.32 -> 21/1.37 -> 19/1.26 -IV diuresis was stopped by cardiology yesterday and he was transitioned to p.o. 40 Lasix today -Avoid Nephrotoxic Medications, Contrast Dyes, as well as Hypotension if possible -Continue to Monitor and Trend Renal Fxn -Repeat CMP in AM    Hyponatremia, hypochloremia, improved -In the setting of diuresis -Mild and sodium was 133 and chloride was 93; now sodium is 136 and chloride is 101 -Continue monitor and trend and IV diuresis now been stopped -Repeat CMP in the a.m.  Hypokalemia -In the setting of diuresis -Patient potassium was 3.4 and improved to 3.8 -Replete with p.o. potassium chloride 40 mg twice daily x2 doses yesterday -Continue monitor replete as necessary -Repeat CMP in a.m.  Elevated LFT, improved -AST was 34 and ALT is slightly elevated  at 75; AST is now 25 and ALT is now 34 and improved -Likely reactive and will continue to monitor and trend -Repeat CMP in a.m.  Leukocytosis, improved -Mildly elevated at 11.4 and is now 9.6 -Continue to monitor for signs and symptoms of infection; currently no evidence of infection exhibited and patient is afebrile -Repeat CBC in a.m.  Elevated TSH;  suspected subclinical hypothyroidism -Likely reactive and TSH was 9.490 -T4 was within normal limits at 0.65 but is on the low end of normal -Check free T3 and was 132 -We will hold off on treating and continue to monitor and recommend repeating LFTs in 4 to 6 weeks  Mitral Regurgitation -Was noted to be moderate -They are recommending reassessment with an echocardiogram once cardiac medications are fully titrated in 3 months  DVT prophylaxis: Enoxaparin 40 mg sq q24h Code Status: FULL CODE Family Communication: No family present at bedside  Disposition Plan: Anticipating discharge home tomorrow pending cardiac clearance  Consultants:   Cardiology  Procedures:  ECHOCARDIOGRAM IMPRESSIONS    1. The left ventricle has a visually estimated ejection fraction of 15-20%. The cavity size was moderately dilated. Findings are consistent with dilated cardiomyopathy. Left ventricular diastolic Doppler parameters are consistent with restrictive  filling. Elevated left atrial and left ventricular end-diastolic pressures Left ventricular diffuse hypokinesis.  2. The right ventricle has moderately reduced systolic function. The cavity was normal. There is no increase in right ventricular wall thickness.  3. Left atrial size was mildly dilated.  4. The mitral valve is abnormal. Mild thickening of the mitral valve leaflet. Mitral valve regurgitation is moderate by color flow Doppler.  5. The tricuspid valve is grossly normal.  6. The aortic valve is abnormal. Mild thickening of the aortic valve. Aortic valve regurgitation is trivial by color flow Doppler. No stenosis of the aortic valve.  7. The aorta is normal unless otherwise noted.  8. The inferior vena cava was dilated in size with <50% respiratory variability.  SUMMARY   LVEF 15-20%, severe global hypokinesis with moderately dilated LV, grade 3 DD with elevated LV filling pressure, moderate RV hypokinesis, mild LAE, moderate MR, trivial  AI, dilated IVC  FINDINGS  Left Ventricle: The left ventricle has a visually estimated ejection fraction of 15-20. The cavity size was moderately dilated. There is no increase in left ventricular wall thickness. Findings are consistent with dilated cardiomyopathy. Left  ventricular diastolic Doppler parameters are consistent with restrictive filling. Elevated left atrial and left ventricular end-diastolic pressures Left ventricular diffuse hypokinesis.  Right Ventricle: The right ventricle has moderately reduced systolic function. The cavity was normal. There is no increase in right ventricular wall thickness.  Left Atrium: Left atrial size was mildly dilated.  Right Atrium: Right atrial size was normal in size. Right atrial pressure is estimated at 10 mmHg.  Interatrial Septum: No atrial level shunt detected by color flow Doppler.  Pericardium: There is no evidence of pericardial effusion.  Mitral Valve: The mitral valve is abnormal. Mild thickening of the mitral valve leaflet. Mitral valve regurgitation is moderate by color flow Doppler.  Tricuspid Valve: The tricuspid valve is grossly normal. Tricuspid valve regurgitation was not visualized by color flow Doppler.  Aortic Valve: The aortic valve is abnormal Mild thickening of the aortic valve. Aortic valve regurgitation is trivial by color flow Doppler. There is No stenosis of the aortic valve.  Pulmonic Valve: The pulmonic valve was grossly normal. Pulmonic valve regurgitation is not visualized by color flow Doppler.  Aorta: The  aorta is normal unless otherwise noted.  Venous: The inferior vena cava is dilated in size with less than 50% respiratory variability.    +--------------+--------++ LEFT VENTRICLE              +--------------+---------++ +--------------+--------++      Diastology              PLAX 2D                     +--------------+---------++ +--------------+--------++      LV e' lateral:6.31  cm/s LV EF:        15-20 %       +--------------+---------++ +--------------+--------++      LV e' medial: 5.66 cm/s LVIDd:        6.50 cm       +--------------+---------++ +--------------+--------++ LVIDs:        5.50 cm  +--------------+--------++ LV PW:        0.90 cm  +--------------+--------++ LV IVS:       0.80 cm  +--------------+--------++ LVOT diam:    2.30 cm  +--------------+--------++ LV SV:        69 ml    +--------------+--------++ LV SV Index:  32.20    +--------------+--------++ LVOT Area:    4.15 cm +--------------+--------++                        +--------------+--------++   +------------------+---------++ LV Volumes (MOD)            +------------------+---------++ LV area d, A4C:   46.30 cm +------------------+---------++ LV area s, A4C:   40.40 cm +------------------+---------++ LV major d, A4C:  9.68 cm   +------------------+---------++ LV major s, A4C:  8.84 cm   +------------------+---------++ LV vol d, MOD A4C:180.0 ml  +------------------+---------++ LV vol s, MOD A4C:150.0 ml  +------------------+---------++ LV SV MOD A4C:    180.0 ml  +------------------+---------++  +---------------+------++ RIGHT VENTRICLE       +---------------+------++ TAPSE (M-mode):1.5 cm +---------------+------++  +---------------+-------++-----------++ LEFT ATRIUM           Index       +---------------+-------++-----------++ LA diam:       4.80 cm2.28 cm/m  +---------------+-------++-----------++ LA Vol (A2C):  81.0 ml38.45 ml/m +---------------+-------++-----------++ LA Vol (A4C):  74.3 ml35.27 ml/m +---------------+-------++-----------++ LA Biplane Vol:78.6 ml37.32 ml/m +---------------+-------++-----------++ +------------+----------++-----------++ RIGHT ATRIUM          Index       +------------+----------++-----------++ RA  Pressure:15.00 mmHg            +------------+----------++-----------++ RA Area:    16.70 cm             +------------+----------++-----------++ RA Volume:  43.70 ml  20.75 ml/m +------------+----------++-----------++    +-------------+-------++ AORTA                +-------------+-------++ Ao Root diam:3.20 cm +-------------+-------++  +---------------+----------++ TRICUSPID VALVE           +---------------+----------++ Estimated RAP: 15.00 mmHg +---------------+----------++   +--------------+-------+ SHUNTS                +--------------+-------+ Systemic Diam:2.30 cm +--------------+-------+   Antimicrobials:  Anti-infectives (From admission, onward)   None     Subjective: Seen and examined at bedside after his Lexiscan Myoview stress test and he states he is feeling good with no chest pain, lightheadedness or dizziness.  No nausea or vomiting.  No shortness of breath noted.  States that he feels much better since coming in.  No other  concerns or complaints at this time.  Objective: Vitals:   12/13/18 1052 12/13/18 1054 12/13/18 1056 12/13/18 1213  BP: (!) 146/52 114/61 101/65 117/79  Pulse: (!) 107 (!) 116 (!) 109 99  Resp:    18  Temp:    98 F (36.7 C)  TempSrc:    Oral  SpO2:    94%  Weight:      Height:        Intake/Output Summary (Last 24 hours) at 12/13/2018 1356 Last data filed at 12/13/2018 2633 Gross per 24 hour  Intake 480 ml  Output 1975 ml  Net -1495 ml   Filed Weights   12/11/18 0526 12/12/18 0430 12/13/18 0432  Weight: 88.3 kg 87.7 kg 87.9 kg   Examination: Physical Exam:  Constitutional: Well-nourished, well-developed overweight Caucasian male currently no acute distress and is eating his lunch Eyes: Lids extract are normal.  Sclera anicteric ENMT: External ears and nose appear normal.  Grossly normal hearing Neck: Appears supple no JVD Respiratory: Slightly diminished auscultation  bilaterally and no appreciable crackles, wheezing, rales, rhonchi.  Patient not tachypneic or using any accessory muscles to breathe Cardiovascular: Regular rate and rhythm.  No extremity edema noted Abdomen: Soft, nontender, distended slightly secondary to body habitus.  Bowel sounds GU: Deferred Musculoskeletal: No contractures or cyanosis.  No joint deformities in the upper lower extremity Skin: Skin is warm dry no appreciable rashes or lesions lymph disc evaluation Neurologic: Cranial nerves II through XII grossly intact but he does stutter quite a bit.  Romberg sign cerebellar reflexes were not assessed Psychiatric: Has a normal judgment and insight.  Patient is awake, alert, oriented  Data Reviewed: I have personally reviewed following labs and imaging studies  CBC: Recent Labs  Lab 12/09/18 1947 12/10/18 1154 12/11/18 0511 12/12/18 0519 12/13/18 1237  WBC 8.1 9.8 10.5 11.4* 9.6  NEUTROABS  --  6.7 6.0 6.9 6.1  HGB 15.0 14.6 16.4 16.0 15.8  HCT 47.3 45.9 50.5 49.0 48.9  MCV 94.6 93.3 91.8 90.6 91.4  PLT 241 220 228 238 354   Basic Metabolic Panel: Recent Labs  Lab 12/10/18 1154 12/10/18 1925 12/11/18 0511 12/12/18 0519 12/13/18 1237  NA 137 138 137 133* 136  K 3.9 3.7 4.3 3.4* 3.8  CL 99 97* 98 93* 101  CO2 29 28 29 27 25   GLUCOSE 116* 114* 105* 117* 134*  BUN 16 15 18  21* 19  CREATININE 1.16 1.22 1.32* 1.37* 1.26*  CALCIUM 8.9 9.5 9.5 9.1 9.0  MG 2.1 2.1 2.1 2.0 2.1  PHOS 3.1  --  4.2 4.6 3.3   GFR: Estimated Creatinine Clearance: 75.3 mL/min (A) (by C-G formula based on SCr of 1.26 mg/dL (H)). Liver Function Tests: Recent Labs  Lab 12/09/18 1947 12/10/18 1154 12/11/18 0511 12/12/18 0519 12/13/18 1237  AST 23 21 27  34 25  ALT 32 29 34 45* 34  ALKPHOS 77 76 73 79 78  BILITOT 0.7 0.7 0.8 1.2 1.1  PROT 7.4 7.2 7.9 7.6 7.1  ALBUMIN 3.8 3.7 3.7 3.7 3.6   Recent Labs  Lab 12/09/18 1947  LIPASE 31   No results for input(s): AMMONIA in the last 168  hours. Coagulation Profile: No results for input(s): INR, PROTIME in the last 168 hours. Cardiac Enzymes: No results for input(s): CKTOTAL, CKMB, CKMBINDEX, TROPONINI in the last 168 hours. BNP (last 3 results) No results for input(s): PROBNP in the last 8760 hours. HbA1C: Recent Labs    12/11/18 0511  HGBA1C  5.9*   CBG: No results for input(s): GLUCAP in the last 168 hours. Lipid Profile: No results for input(s): CHOL, HDL, LDLCALC, TRIG, CHOLHDL, LDLDIRECT in the last 72 hours. Thyroid Function Tests: Recent Labs    12/11/18 1400 12/12/18 0811  TSH 9.490*  --   FREET4  --  0.65   Anemia Panel: No results for input(s): VITAMINB12, FOLATE, FERRITIN, TIBC, IRON, RETICCTPCT in the last 72 hours. Sepsis Labs: No results for input(s): PROCALCITON, LATICACIDVEN in the last 168 hours.  Recent Results (from the past 240 hour(s))  SARS CORONAVIRUS 2 Nasal Swab Aptima Multi Swab     Status: None   Collection Time: 12/10/18  4:43 AM   Specimen: Aptima Multi Swab; Nasal Swab  Result Value Ref Range Status   SARS Coronavirus 2 NEGATIVE NEGATIVE Final    Comment: (NOTE) SARS-CoV-2 target nucleic acids are NOT DETECTED. The SARS-CoV-2 RNA is generally detectable in upper and lower respiratory specimens during the acute phase of infection. Negative results do not preclude SARS-CoV-2 infection, do not rule out co-infections with other pathogens, and should not be used as the sole basis for treatment or other patient management decisions. Negative results must be combined with clinical observations, patient history, and epidemiological information. The expected result is Negative. Fact Sheet for Patients: SugarRoll.be Fact Sheet for Healthcare Providers: https://www.woods-mathews.com/ This test is not yet approved or cleared by the [place] FDA and  has been authorized for detection and/or diagnosis of SARS-CoV-2 by FDA under an  Emergency Use Authorization (EUA). This EUA will remain  in effect (meaning this test can be used) for the duration of the COVID-19 declaration under Section 56 4(b)(1) of the Act, 21 U.S.C. section 360bbb-3(b)(1), unless the authorization is terminated or revoked sooner. Performed at [facility], [place] [address] [place], [place] [address]   MRSA PCR Screening     Status: None   Collection Time: 12/10/18  6:24 PM   Specimen: Nasopharyngeal  Result Value Ref Range Status   MRSA by PCR NEGATIVE NEGATIVE Final    Comment:        The GeneXpert MRSA Assay (FDA approved for NASAL specimens only), is one component of a comprehensive MRSA colonization surveillance program. It is not intended to diagnose MRSA infection nor to guide or monitor treatment for MRSA infections. Performed at [facility], [place] [address], [place], [place] [address]     Radiology Studies: Dg Chest Port 1 View  Result Date: 12/13/2018 CLINICAL DATA:  Dyspnea EXAM: PORTABLE CHEST 1 VIEW COMPARISON:  12/10/2018 chest radiograph. FINDINGS: Stable cardiomediastinal silhouette with normal heart size. No pneumothorax. No pleural effusion. Minimal hazy right middle lobe opacity. No pulmonary edema. Clear left lung. IMPRESSION: Minimal hazy right middle lobe opacity, similar to prior, favor minimal scarring or atelectasis. Otherwise no active disease. Electronically Signed   By: Ilona Sorrel M.D.   On: 12/13/2018 08:09   Scheduled Meds: . carvedilol  3.125 mg Oral BID  . enoxaparin (LOVENOX) injection  40 mg Subcutaneous Q24H  . furosemide  40 mg Oral Daily  . guaiFENesin  1,200 mg Oral BID  . losartan  25 mg Oral Daily  . pantoprazole  40 mg Oral Daily  . sodium chloride flush  3 mL Intravenous Q12H  . spironolactone  12.5 mg Oral Daily   Continuous Infusions: . sodium chloride      LOS: 2 days   Kerney Elbe, DO [facility] PAGER is on [place]  If  7PM-7AM, please contact  night-coverage [email or web address] Password [email or web address] 12/13/2018, 1:56 PM

## 2018-12-13 NOTE — Progress Notes (Signed)
   Jonathan King presented for a nuclear stress test today.  No immediate complications.  Stress imaging is pending at this time.  Preliminary EKG findings may be listed in the chart, but the stress test result will not be finalized until perfusion imaging is complete.  Tami Lin Dalayla Aldredge, PA-C 12/13/2018, 12:04 PM

## 2018-12-13 NOTE — Progress Notes (Signed)
Progress Note  Patient Name: Jonathan King Date of Encounter: 12/13/2018  Primary Cardiologist: Dr Debara Pickett  Subjective   No CP; dyspnea resolved; no palpitations or h/o syncope  Inpatient Medications    Scheduled Meds: . carvedilol  3.125 mg Oral BID  . enoxaparin (LOVENOX) injection  40 mg Subcutaneous Q24H  . furosemide  40 mg Oral Daily  . guaiFENesin  1,200 mg Oral BID  . pantoprazole  40 mg Oral Daily  . sodium chloride flush  3 mL Intravenous Q12H   Continuous Infusions: . sodium chloride     PRN Meds: sodium chloride, acetaminophen, benzonatate, ondansetron (ZOFRAN) IV, sodium chloride flush   Vital Signs    Vitals:   12/12/18 1036 12/12/18 1206 12/12/18 2014 12/13/18 0432  BP: 112/85 92/74 111/74 103/83  Pulse: 94 66 (!) 45 98  Resp:  20 20 20   Temp:  98.2 F (36.8 C) 98.8 F (37.1 C) 98.5 F (36.9 C)  TempSrc:  Oral Oral Oral  SpO2:  (!) 89% 95% 95%  Weight:    87.9 kg  Height:        Intake/Output Summary (Last 24 hours) at 12/13/2018 0917 Last data filed at 12/13/2018 0700 Gross per 24 hour  Intake 960 ml  Output 2500 ml  Net -1540 ml   Last 3 Weights 12/13/2018 12/12/2018 12/11/2018  Weight (lbs) 193 lb 11.2 oz 193 lb 6.4 oz 194 lb 11.2 oz  Weight (kg) 87.862 kg 87.726 kg 88.315 kg      Telemetry    Sinus with PVCs and NSVT (19 beats)- Personally Reviewed  Physical Exam   GEN: No acute distress.   Neck: No JVD Cardiac: RRR, no murmurs, rubs, or gallops.  Respiratory: Clear to auscultation bilaterally. GI: Soft, nontender, non-distended  MS: No edema Neuro:  Nonfocal  Psych: Normal affect   Labs    High Sensitivity Troponin:   Recent Labs  Lab 12/10/18 0248 12/10/18 0419 12/10/18 1154 12/10/18 1302  TROPONINIHS 42* 46* 41* 41*        Chemistry Recent Labs  Lab 12/10/18 1154 12/10/18 1925 12/11/18 0511 12/12/18 0519  NA 137 138 137 133*  K 3.9 3.7 4.3 3.4*  CL 99 97* 98 93*  CO2 29 28 29 27   GLUCOSE 116* 114* 105*  117*  BUN 16 15 18  21*  CREATININE 1.16 1.22 1.32* 1.37*  CALCIUM 8.9 9.5 9.5 9.1  PROT 7.2  --  7.9 7.6  ALBUMIN 3.7  --  3.7 3.7  AST 21  --  27 34  ALT 29  --  34 45*  ALKPHOS 76  --  73 79  BILITOT 0.7  --  0.8 1.2  GFRNONAA >60 >60 >60 59*  GFRAA >60 >60 >60 >60  ANIONGAP 9 13 10 13      Hematology Recent Labs  Lab 12/10/18 1154 12/11/18 0511 12/12/18 0519  WBC 9.8 10.5 11.4*  RBC 4.92 5.50 5.41  HGB 14.6 16.4 16.0  HCT 45.9 50.5 49.0  MCV 93.3 91.8 90.6  MCH 29.7 29.8 29.6  MCHC 31.8 32.5 32.7  RDW 13.2 13.2 13.1  PLT 220 228 238    BNP Recent Labs  Lab 12/10/18 0248  BNP 1,200.8*     Radiology    Dg Chest Port 1 View  Result Date: 12/13/2018 CLINICAL DATA:  Dyspnea EXAM: PORTABLE CHEST 1 VIEW COMPARISON:  12/10/2018 chest radiograph. FINDINGS: Stable cardiomediastinal silhouette with normal heart size. No pneumothorax. No pleural effusion. Minimal hazy right  middle lobe opacity. No pulmonary edema. Clear left lung. IMPRESSION: Minimal hazy right middle lobe opacity, similar to prior, favor minimal scarring or atelectasis. Otherwise no active disease. Electronically Signed   By: Ilona Sorrel M.D.   On: 12/13/2018 08:09    Patient Profile     53 y.o. male admitted with new onset systolic congestive heart failure.  Echocardiogram showed severe LV dysfunction and RV dysfunction, moderate mitral regurgitation.  Assessment & Plan    1 acute systolic congestive heart failure-patient appears to be euvolemic on examination.  Lasix was changed to 40 mg daily yesterday.  Add Spironolactone 12.5 mg daily.  Needs fluid restriction and low-sodium diet.  2 cardiomyopathy-etiology unclear.  Free T4 is normal.  HIV negative.  No history of alcohol abuse by his report.  Dr. Debara Pickett recommended cardiac catheterization but apparently the patient declined.  He is scheduled for nuclear study today to screen for coronary disease.  Continue low-dose carvedilol.  Add losartan 25 mg  daily.  I do not think his blood pressure will tolerate Entresto at this point.  Could be considered as an outpatient.  Will need follow-up echocardiogram in 3 months after medications fully titrated to reassess LV function.  Would consider cardiac MRI after discharge.  Would also consider referral to CHF clinic.  3 NSVT-continue carvedilol.  No history of syncope.  Titrate as an outpatient.  4 moderate mitral regurgitation-we will need reassessment via echocardiogram once cardiac medications fully titrated.  5 acute renal insufficiency-creatinine mildly increased likely secondary to diuresis.  Will repeat tomorrow morning.  Potential discharge tomorrow if stable.  For questions or updates, please contact [place] Please consult [email or web address] for contact info under        Signed, Kirk Ruths, MD  12/13/2018, 9:17 AM

## 2018-12-13 NOTE — Progress Notes (Signed)
Pt's friend is at bedside.

## 2018-12-13 NOTE — Progress Notes (Signed)
Patient had 19 beats of VT per CCMD.  RN notified triad.

## 2018-12-13 NOTE — Progress Notes (Signed)
Order from Cardiologist Aktmer for potassium 40 meq once ordered Neta Mends RN 11:30 PM 12-13-2018

## 2018-12-14 ENCOUNTER — Inpatient Hospital Stay (HOSPITAL_COMMUNITY): Payer: Self-pay

## 2018-12-14 DIAGNOSIS — R0602 Shortness of breath: Secondary | ICD-10-CM

## 2018-12-14 DIAGNOSIS — I5041 Acute combined systolic (congestive) and diastolic (congestive) heart failure: Secondary | ICD-10-CM

## 2018-12-14 DIAGNOSIS — I493 Ventricular premature depolarization: Secondary | ICD-10-CM

## 2018-12-14 DIAGNOSIS — J690 Pneumonitis due to inhalation of food and vomit: Secondary | ICD-10-CM

## 2018-12-14 DIAGNOSIS — N289 Disorder of kidney and ureter, unspecified: Secondary | ICD-10-CM

## 2018-12-14 DIAGNOSIS — R9439 Abnormal result of other cardiovascular function study: Secondary | ICD-10-CM

## 2018-12-14 DIAGNOSIS — R918 Other nonspecific abnormal finding of lung field: Secondary | ICD-10-CM

## 2018-12-14 LAB — CBC WITH DIFFERENTIAL/PLATELET
Abs Immature Granulocytes: 0.04 10*3/uL (ref 0.00–0.07)
Basophils Absolute: 0.1 10*3/uL (ref 0.0–0.1)
Basophils Relative: 1 %
Eosinophils Absolute: 0.4 10*3/uL (ref 0.0–0.5)
Eosinophils Relative: 4 %
HCT: 49.4 % (ref 39.0–52.0)
Hemoglobin: 16 g/dL (ref 13.0–17.0)
Immature Granulocytes: 0 %
Lymphocytes Relative: 25 %
Lymphs Abs: 2.6 10*3/uL (ref 0.7–4.0)
MCH: 29.9 pg (ref 26.0–34.0)
MCHC: 32.4 g/dL (ref 30.0–36.0)
MCV: 92.2 fL (ref 80.0–100.0)
Monocytes Absolute: 1 10*3/uL (ref 0.1–1.0)
Monocytes Relative: 9 %
Neutro Abs: 6.2 10*3/uL (ref 1.7–7.7)
Neutrophils Relative %: 61 %
Platelets: 247 10*3/uL (ref 150–400)
RBC: 5.36 MIL/uL (ref 4.22–5.81)
RDW: 13.1 % (ref 11.5–15.5)
WBC: 10.3 10*3/uL (ref 4.0–10.5)
nRBC: 0 % (ref 0.0–0.2)

## 2018-12-14 LAB — BASIC METABOLIC PANEL
Anion gap: 10 (ref 5–15)
BUN: 19 mg/dL (ref 6–20)
CO2: 26 mmol/L (ref 22–32)
Calcium: 9 mg/dL (ref 8.9–10.3)
Chloride: 99 mmol/L (ref 98–111)
Creatinine, Ser: 1.18 mg/dL (ref 0.61–1.24)
GFR calc Af Amer: >60 mL/min (ref >60)
GFR calc non Af Amer: >60 mL/min (ref >60)
Glucose, Bld: 108 mg/dL — ABNORMAL HIGH (ref 70–99)
Potassium: 4.2 mmol/L (ref 3.5–5.1)
Sodium: 135 mmol/L (ref 135–145)

## 2018-12-14 LAB — PROCALCITONIN: Procalcitonin: 0.1 ng/mL

## 2018-12-14 LAB — MAGNESIUM: Magnesium: 2.2 mg/dL (ref 1.7–2.4)

## 2018-12-14 LAB — PHOSPHORUS: Phosphorus: 3.8 mg/dL (ref 2.5–4.6)

## 2018-12-14 MED ORDER — NITROGLYCERIN 0.4 MG SL SUBL
SUBLINGUAL_TABLET | SUBLINGUAL | Status: AC
  Administered 2018-12-14: 15:00:00
  Filled 2018-12-14: qty 2

## 2018-12-14 MED ORDER — FUROSEMIDE 10 MG/ML IJ SOLN
80.0000 mg | Freq: Once | INTRAMUSCULAR | Status: AC
  Administered 2018-12-14: 80 mg via INTRAVENOUS
  Filled 2018-12-14: qty 8

## 2018-12-14 MED ORDER — SODIUM CHLORIDE 0.9 % IV SOLN
3.0000 g | Freq: Three times a day (TID) | INTRAVENOUS | Status: DC
  Administered 2018-12-14 – 2018-12-15 (×3): 3 g via INTRAVENOUS
  Filled 2018-12-14: qty 8
  Filled 2018-12-14 (×3): qty 3

## 2018-12-14 MED ORDER — IVABRADINE HCL 7.5 MG PO TABS
15.0000 mg | ORAL_TABLET | Freq: Once | ORAL | Status: AC
  Administered 2018-12-14: 15 mg via ORAL
  Filled 2018-12-14: qty 2

## 2018-12-14 MED ORDER — FUROSEMIDE 10 MG/ML IJ SOLN: 80.0000 mg | Freq: Once | INTRAMUSCULAR | Status: DC

## 2018-12-14 MED ORDER — METOPROLOL TARTRATE 5 MG/5ML IV SOLN
INTRAVENOUS | Status: AC
  Administered 2018-12-14: 13:00:00
  Filled 2018-12-14: qty 20

## 2018-12-14 MED ORDER — NITROGLYCERIN 0.4 MG SL SUBL
0.4000 mg | SUBLINGUAL_TABLET | Freq: Once | SUBLINGUAL | Status: AC
  Administered 2018-12-14: 0.4 mg via SUBLINGUAL
  Filled 2018-12-14: qty 1

## 2018-12-14 MED ORDER — IOHEXOL 350 MG/ML SOLN
90.0000 mL | Freq: Once | INTRAVENOUS | Status: AC | PRN
  Administered 2018-12-14: 90 mL via INTRAVENOUS

## 2018-12-14 MED ORDER — METOPROLOL TARTRATE 5 MG/5ML IV SOLN
5.0000 mg | INTRAVENOUS | Status: DC | PRN
  Administered 2018-12-14: 5 mg via INTRAVENOUS

## 2018-12-14 MED ORDER — GUAIFENESIN ER 600 MG PO TB12: 1200.0000 mg | ORAL_TABLET | Freq: Two times a day (BID) | ORAL | Status: DC

## 2018-12-14 MED ORDER — NITROGLYCERIN 0.4 MG SL SUBL: 0.4000 mg | SUBLINGUAL_TABLET | SUBLINGUAL | Status: DC | PRN

## 2018-12-14 NOTE — Consult Note (Signed)
In error

## 2018-12-14 NOTE — Consult Note (Signed)
NAME:  Jonathan King, MRN:  [id number], DOB:  October 07, 1965, LOS: 3 ADMISSION DATE:  12/09/2018, CONSULTATION DATE:  12/14/18 REFERRING MD:  Alfredia Ferguson  CHIEF COMPLAINT:  Lung mass   Brief History   Jonathan King is a 53 y.o. male who presented with dyspnea felt to be secondary to volume overload. 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 Refused LHC and underwent cardiac CT, which described mass-like lesion. PCCM consulted.   History of present illness   Jonathan King is a 53 y.o. male who has no significant PMH.  He presented to [facility] 8/14 with increasing SOB, orthopnea, LE edema.  Symptoms first started roughly 1 month prior and progressed.  He was admitted by [facility] and started on IV diuresis.  Echo 12/11/18 demonstrated EF 15-20% with mod RV dysfunction.  Nuclear study performed and showed EF 16%. He was evaluated by cardiology but refused cardiac cath due to risk of CVA.    CT coronaries was performed and showed  transient ischemic dilatation, marked LV dysfunction, LVEF 16%.  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  He was started on Unasyn and PCCM was called in consultation for lung mass.  Past Medical History  None  Significant Hospital Events   8/14 > admit.  Consults:  Cardiology. Advanced heart failure. PCCM.  Procedures:  None.  Significant Diagnostic Tests:  Echo 8/15 > EF 23-76%, RV systolic function mod reduced. Nuc stress test 8/17 > transient ischemic dilatation, marked LV dysfunction, LVEF 16%. CT coronaries 8/18 > Ca score 120,   Micro Data:  SARS CoV2 8/14 > neg  Antimicrobials:  Unasyn 8/14 >    Interim history/subjective:    Objective:  Blood pressure 118/77, pulse 67, temperature 98.1 F (36.7 C), temperature source Oral, resp. rate 18, height 6' (1.829 m), weight 87.2 kg, SpO2 94 %.        Intake/Output Summary (Last 24 hours) at 12/14/2018 1659 Last data filed at 12/14/2018 1636     Gross per 24 hour  Intake 480 ml  Output 2825 ml  Net -2345 ml        Filed Weights   12/12/18 0430 12/13/18 0432 12/14/18 0457  Weight: 87.7 kg 87.9 kg 87.2 kg    Examination: General: Middle aged adult male in NAD Neuro: Alert, oriented, non-focal.  HEENT: [place]/AT, PERRL, no JVD Cardiovascular: RRR, on MRG Lungs: Clear bilateral breath sounds Abdomen: Soft, non-tender, non-distended Musculoskeletal: No acute deformity or ROM limitation. No peripheral edema.  Skin: Grossly intact.    Resolved Hospital Problem list   N/A  Assessment & Plan:  53 year old male with no significant PMH who presents to PCCM with SOB, aspiration pneumonia and a lung mass.  Life long non-smoker, minimal drinking, minimal exposure.  On exam, clear lungs bilaterally.  I reviewed chest CT myself, left sided subpleural mass noted and RLL dependent infiltrate noted.  Discussed with PCCM-NP.  Lung mass:  - Not appropriate to approach at this time  - Must be cleared from acute cardiac event  - Not approachable bronchoscopically  - Will need a CT guided biopsy then pending result f/u with oncology  Dyspnea: likely due to pulmonary edema  - Diureses  - Treat heart failure  - No need for bronchodilators or steroids at this time  Aspiration concerns: likely pneumonitis  - Check procal, if negative would d/c abx as there are no active signs of infection, procal ordered  PCCM will sign  off, please call back if needed.  Labs   CBC: Recent Labs  Lab 12/10/18 1154 12/11/18 0511 12/12/18 0519 12/13/18 1237 12/14/18 0512  WBC 9.8 10.5 11.4* 9.6 10.3  NEUTROABS 6.7 6.0 6.9 6.1 6.2  HGB 14.6 16.4 16.0 15.8 16.0  HCT 45.9 50.5 49.0 48.9 49.4  MCV 93.3 91.8 90.6 91.4 92.2  PLT 220 228 238 243 885    Basic Metabolic Panel: Recent Labs  Lab 12/10/18 1154 12/10/18 1925 12/11/18 0511 12/12/18 0519 12/13/18 1237 12/14/18 0512  NA 137 138 137 133* 136 135  K 3.9 3.7 4.3 3.4* 3.8 4.2  CL 99 97*  98 93* 101 99  CO2 29 28 29 27 25 26   GLUCOSE 116* 114* 105* 117* 134* 108*  BUN 16 15 18  21* 19 19  CREATININE 1.16 1.22 1.32* 1.37* 1.26* 1.18  CALCIUM 8.9 9.5 9.5 9.1 9.0 9.0  MG 2.1 2.1 2.1 2.0 2.1 2.2  PHOS 3.1  --  4.2 4.6 3.3 3.8   GFR: Estimated Creatinine Clearance: 80.4 mL/min (by C-G formula based on SCr of 1.18 mg/dL). Recent Labs  Lab 12/11/18 0511 12/12/18 0519 12/13/18 1237 12/14/18 0512  WBC 10.5 11.4* 9.6 10.3    Liver Function Tests: Recent Labs  Lab 12/09/18 1947 12/10/18 1154 12/11/18 0511 12/12/18 0519 12/13/18 1237  AST 23 21 27  34 25  ALT 32 29 34 45* 34  ALKPHOS 77 76 73 79 78  BILITOT 0.7 0.7 0.8 1.2 1.1  PROT 7.4 7.2 7.9 7.6 7.1  ALBUMIN 3.8 3.7 3.7 3.7 3.6   Recent Labs  Lab 12/09/18 1947  LIPASE 31   No results for input(s): AMMONIA in the last 168 hours.  ABG No results found for: PHART, PCO2ART, PO2ART, HCO3, TCO2, ACIDBASEDEF, O2SAT   Coagulation Profile: No results for input(s): INR, PROTIME in the last 168 hours.  Cardiac Enzymes: No results for input(s): CKTOTAL, CKMB, CKMBINDEX, TROPONINI in the last 168 hours.  HbA1C: Hgb A1c MFr Bld  Date/Time Value Ref Range Status  12/11/2018 05:11 AM 5.9 (H) 4.8 - 5.6 % Final    Comment:    (NOTE) Pre diabetes:          5.7%-6.4% Diabetes:              >6.4% Glycemic control for   <7.0% adults with diabetes     CBG: No results for input(s): GLUCAP in the last 168 hours.  Review of Systems:   12 point ROS is negative other than above  Past Medical History  He,  has a past medical history of Back pain.   Surgical History    Past Surgical History:  Procedure Laterality Date  . CHOLECYSTECTOMY  01/31/2012   Procedure: LAPAROSCOPIC CHOLECYSTECTOMY;  Surgeon: Gayland Curry, MD,FACS;  Location: [facility];  Service: General;  Laterality: N/A;     Social History   reports that he has never smoked. He has never used smokeless tobacco. He reports current alcohol use. He reports  that he does not use drugs.   Family History   His family history includes Parkinson's disease in his mother.   Allergies No Known Allergies   Home Medications  Prior to Admission medications   Medication Sig Start Date End Date Taking? Authorizing Provider  ibuprofen (ADVIL,MOTRIN) 600 MG tablet Take 1 tablet (600 mg total) by mouth every 6 (six) hours as needed. For pain Patient taking differently: Take 400 mg by mouth every 6 (six) hours as needed for moderate  pain.  06/12/16  Yes Nanavati, Ankit, MD  omeprazole (PRILOSEC) 20 MG capsule Take 1 capsule (20 mg total) by mouth daily. 11/12/18  Yes Horton, Barbette Hair, MD  ondansetron (ZOFRAN ODT) 4 MG disintegrating tablet Take 1 tablet (4 mg total) by mouth every 8 (eight) hours as needed for nausea or vomiting. 11/12/18  Yes Horton, Barbette Hair, MD    Patient seen and examined, agree with above note.  I dictated the care and orders written for this patient under my direction.  Rush Farmer, [place]

## 2018-12-14 NOTE — Consult Note (Addendum)
Advanced Heart Failure Team Consult Note   Primary Physician: Patient, No Pcp Per PCP-Cardiologist:  No primary care provider on file.  Reason for Consultation: Heart Failure   HPI:    Jonathan King is seen today for evaluation of heart failure at the request of Dr Stanford Breed.    53 year old patient with a history of cholecystectomy in 2013 and no medical history.   Presented with increase shortness of breath, orthopnea, and lower extremity edema that has been getting progressively worse over the last month. In ED O2 sats 90%.   Echo 12/11/18 LVEF 15-20% with moderate RV dysfunction .  Feels better with diuresis. Refused cath due to risk of CVA. Nuclear study yesterday with EF 16% and mild ischemia + possible TID.    Review of Systems: [y] = yes, [ ]  = no   . General: Weight gain [ ] ; Weight loss [ ] ; Anorexia [ ] ; Fatigue [ ] ; Fever [ ] ; Chills [ ] ; Weakness [ ]   . Cardiac: Chest pain/pressure [ ] ; Resting SOB [ ] ; Exertional SOB [ ] ; Orthopnea [ ] ; Pedal Edema [ ] ; Palpitations [ ] ; Syncope [ ] ; Presyncope [ ] ; Paroxysmal nocturnal dyspnea[ ]   . Pulmonary: Cough [ ] ; Wheezing[ ] ; Hemoptysis[ ] ; Sputum [ ] ; Snoring [ ]   . GI: Vomiting[ ] ; Dysphagia[ ] ; Melena[ ] ; Hematochezia [ ] ; Heartburn[ ] ; Abdominal pain [ ] ; Constipation [ ] ; Diarrhea [ ] ; BRBPR [ ]   . GU: Hematuria[ ] ; Dysuria [ ] ; Nocturia[ ]   . Vascular: Pain in legs with walking [ ] ; Pain in feet with lying flat [ ] ; Non-healing sores [ ] ; Stroke [ ] ; TIA [ ] ; Slurred speech [ ] ;  . Neuro: Headaches[ ] ; Vertigo[ ] ; Seizures[ ] ; Paresthesias[ ] ;Blurred vision [ ] ; Diplopia [ ] ; Vision changes [ ]   . Ortho/Skin: Arthritis [name] ]; Joint pain [ y]; Muscle pain [ ] ; Joint swelling [ ] ; Back Pain [ ] ; Rash [ ]   . Psych: Depression[ ] ; Anxiety[ ]   . Heme: Bleeding problems [ ] ; Clotting disorders [ ] ; Anemia [ ]   . Endocrine: Diabetes [ ] ; Thyroid dysfunction[ ]   Home Medications Prior to Admission medications   Medication  Sig Start Date End Date Taking? Authorizing Provider  ibuprofen (ADVIL,MOTRIN) 600 MG tablet Take 1 tablet (600 mg total) by mouth every 6 (six) hours as needed. For pain Patient taking differently: Take 400 mg by mouth every 6 (six) hours as needed for moderate pain.  06/12/16  Yes Varney Biles, MD  omeprazole (PRILOSEC) 20 MG capsule Take 1 capsule (20 mg total) by mouth daily. 11/12/18  Yes Horton, Barbette Hair, MD  ondansetron (ZOFRAN ODT) 4 MG disintegrating tablet Take 1 tablet (4 mg total) by mouth every 8 (eight) hours as needed for nausea or vomiting. 11/12/18  Yes Horton, Barbette Hair, MD    Past Medical History: Past Medical History:  Diagnosis Date  . Back pain     Past Surgical History: Past Surgical History:  Procedure Laterality Date  . CHOLECYSTECTOMY  01/31/2012   Procedure: LAPAROSCOPIC CHOLECYSTECTOMY;  Surgeon: Gayland Curry, MD,FACS;  Location: [facility];  Service: General;  Laterality: N/A;    Family History: Family History  Problem Relation Age of Onset  . Parkinson's disease Mother     Social History: Social History   Socioeconomic History  . Marital status: Divorced    Spouse name: Not on file  . Number of children: Not on file  . Years  of education: Not on file  . Highest education level: Not on file  Occupational History  . Not on file  Social Needs  . Financial resource strain: Not on file  . Food insecurity    Worry: Not on file    Inability: Not on file  . Transportation needs    Medical: Not on file    Non-medical: Not on file  Tobacco Use  . Smoking status: Never Smoker  . Smokeless tobacco: Never Used  Substance and Sexual Activity  . Alcohol use: Yes    Comment: occ  . Drug use: No  . Sexual activity: Not on file  Lifestyle  . Physical activity    Days per week: Not on file    Minutes per session: Not on file  . Stress: Not on file  Relationships  . Social Herbalist on phone: Not on file    Gets together: Not on file     Attends religious service: Not on file    Active member of club or organization: Not on file    Attends meetings of clubs or organizations: Not on file    Relationship status: Not on file  Other Topics Concern  . Not on file  Social History Narrative  . Not on file    Allergies:  No Known Allergies  Objective:    Vital Signs:   Temp:  [97.8 F (36.6 C)-98.2 F (36.8 C)] 98.2 F (36.8 C) (08/18 0733) Pulse Rate:  [69-116] 84 (08/18 0733) Resp:  [18-20] 20 (08/18 0733) BP: (90-146)/(52-80) 104/77 (08/18 0733) SpO2:  [90 %-94 %] 90 % (08/18 0733) Weight:  [87.2 kg] 87.2 kg (08/18 0457) Last BM Date: 12/12/18  Weight change: Filed Weights   12/12/18 0430 12/13/18 0432 12/14/18 0457  Weight: 87.7 kg 87.9 kg 87.2 kg    Intake/Output:   Intake/Output Summary (Last 24 hours) at 12/14/2018 0820 Last data filed at 12/13/2018 1900 Gross per 24 hour  Intake 720 ml  Output 975 ml  Net -255 ml      Physical Exam    General:  Unkempt No resp difficulty. Stutters frequently  HEENT: normal Neck: supple. JVP 8-9 . Carotids 2+ bilat; no bruits. No lymphadenopathy or thyromegaly appreciated. Cor: PMI nondisplaced. Regular rate & rhythm. With frequent ectopy No rubs, gallops or murmurs. Lungs: clear Abdomen: soft, nontender, mildly distended. No hepatosplenomegaly. No bruits or masses. Good bowel sounds. Extremities: no cyanosis, clubbing, rash, edema Neuro: alert & orientedx3, cranial nerves grossly intact. moves all 4 extremities w/o difficulty. Affect pleasant   Telemetry   NSR 70-80 with 20 beat NSVT Personally reviewed   EKG    Sinus tach 100 with frequent multi focal PVCS Personally reviewed   Labs   Basic Metabolic Panel: Recent Labs  Lab 12/10/18 1154 12/10/18 1925 12/11/18 0511 12/12/18 0519 12/13/18 1237 12/14/18 0512  NA 137 138 137 133* 136 135  K 3.9 3.7 4.3 3.4* 3.8 4.2  CL 99 97* 98 93* 101 99  CO2 29 28 29 27 25 26   GLUCOSE 116* 114* 105* 117*  134* 108*  BUN 16 15 18  21* 19 19  CREATININE 1.16 1.22 1.32* 1.37* 1.26* 1.18  CALCIUM 8.9 9.5 9.5 9.1 9.0 9.0  MG 2.1 2.1 2.1 2.0 2.1 2.2  PHOS 3.1  --  4.2 4.6 3.3 3.8    Liver Function Tests: Recent Labs  Lab 12/09/18 1947 12/10/18 1154 12/11/18 0511 12/12/18 0519 12/13/18 1237  AST 23 21  27 34 25  ALT 32 29 34 45* 34  ALKPHOS 77 76 73 79 78  BILITOT 0.7 0.7 0.8 1.2 1.1  PROT 7.4 7.2 7.9 7.6 7.1  ALBUMIN 3.8 3.7 3.7 3.7 3.6   Recent Labs  Lab 12/09/18 1947  LIPASE 31   No results for input(s): AMMONIA in the last 168 hours.  CBC: Recent Labs  Lab 12/10/18 1154 12/11/18 0511 12/12/18 0519 12/13/18 1237 12/14/18 0512  WBC 9.8 10.5 11.4* 9.6 10.3  NEUTROABS 6.7 6.0 6.9 6.1 6.2  HGB 14.6 16.4 16.0 15.8 16.0  HCT 45.9 50.5 49.0 48.9 49.4  MCV 93.3 91.8 90.6 91.4 92.2  PLT 220 228 238 243 247    Cardiac Enzymes: No results for input(s): CKTOTAL, CKMB, CKMBINDEX, TROPONINI in the last 168 hours.  BNP: BNP (last 3 results) Recent Labs    12/10/18 0248  BNP 1,200.8*    ProBNP (last 3 results) No results for input(s): PROBNP in the last 8760 hours.   CBG: No results for input(s): GLUCAP in the last 168 hours.  Coagulation Studies: No results for input(s): LABPROT, INR in the last 72 hours.   Imaging   Nm Myocar Multi W/spect W/wall Motion / Ef  Result Date: 12/13/2018 CLINICAL DATA:  53 year old male with shortness of breath and cardiomyopathy, possibly nonischemic. EXAM: MYOCARDIAL IMAGING WITH SPECT (REST AND PHARMACOLOGIC-STRESS) GATED LEFT VENTRICULAR WALL MOTION STUDY LEFT VENTRICULAR EJECTION FRACTION TECHNIQUE: Standard myocardial SPECT imaging was performed after resting intravenous injection of 10 mCi Tc-31m sestamibi. Subsequently, intravenous infusion of Lexiscan was performed under the supervision of the Cardiology staff. At peak effect of the drug, 30 mCi Tc-34m sestamibi was injected intravenously and standard myocardial SPECT imaging  was performed. Quantitative gated imaging was also performed to evaluate left ventricular wall motion, and estimate left ventricular ejection fraction. COMPARISON:  Prior chest x-ray 12/13/2018 FINDINGS: Perfusion: Patchy areas of decreased radiotracer uptake at the ventricular apex and apically anteroseptal wall which worsen following pharmacological stress. Additionally, there is diffuse mild transient ischemic dilatation suggesting diffuse reversible ischemia throughout the endocardium. Wall Motion: Marked left ventricular dilatation and global hypokinesis. Left Ventricular Ejection Fraction: 16 % End diastolic volume 010 ml End systolic volume 272 ml IMPRESSION: 1. Imaging findings are consistent with transient ischemic dilatation. While nonspecific, this can be seen in the setting of diffuse endocardial reversible ischemia as can be seen with multivessel coronary artery disease. Additionally, there is a small area of relatively more severe reversible ischemia along the apically anterior and anteroseptal walls along the distribution of the left anterior descending coronary artery. 2. Marked left ventricular dilation with severe global hypokinesis. 3. Left ventricular ejection fraction 16% 4. Non invasive risk stratification*: High *2012 Appropriate Use Criteria for Coronary Revascularization Focused Update: J Am Coll Cardiol. 5366;44(0):347-425. http://content.airportbarriers.com.aspx?articleid=1201161 These results were called by telephone at the time of interpretation on 12/13/2018 at 2:57 pm to Dr. Alfredia Ferguson, who verbally acknowledged these results. Electronically Signed   By: Jacqulynn Cadet M.D.   On: 12/13/2018 14:57      Medications:     Current Medications: . aspirin EC  81 mg Oral Daily  . atorvastatin  80 mg Oral q1800  . carvedilol  3.125 mg Oral BID  . enoxaparin (LOVENOX) injection  40 mg Subcutaneous Q24H  . furosemide  40 mg Oral Daily  . guaiFENesin  1,200 mg Oral BID  . losartan   25 mg Oral Daily  . pantoprazole  40 mg Oral Daily  . sodium chloride  flush  3 mL Intravenous Q12H  . spironolactone  12.5 mg Oral Daily     Infusions: . sodium chloride         Assessment/Plan   1) Acute systolic HF - EF 93-26% by echo - EF 16% by Myoview with possible TID and mild ischemia - Refuses cath due to stroke risk despite extensive discussion (got very agitated with this discussion) - Will plan CTA coronaries today +/- cMRI to further evaluate - If not CAD, I suspect CM may be related to frequent PVCs. Will quantify on tele - NYHA III-IIIb  - Volume still elevated mildly elevated will give one more dose lasix today - Add digoxin - Continue carvedilol, losartan and spiro. Titrate as needed. BP too low for Entresto  2) Frequent PVCs/NSVT - will quantify on tele - if > 10% will need amio to suppress - Keep K > 4.0 Mg > 2.0 - will need sleep study to investigate underlying cause  3) Lack of social support - Will need close f/u in HF Clinic with likely involvement of Paramedicine and SW teams  Length of Stay: 3  Glori Bickers, MD  10:52 AM   Advanced Heart Failure Team Pager [phone] (M-F; 7a - 4p)  Please contact [facility] for night-coverage after hours (4p -7a ) and weekends on [email or web address]

## 2018-12-14 NOTE — Progress Notes (Addendum)
PROGRESS NOTE    Jonathan King  WIO:[id number] DOB: [birth year] DOA: 12/09/2018 PCP: Patient, No Pcp Per   Brief Narrative:  The patient is a 53 year old Caucasian obese male with no real appreciable past medical history except history of intermittent alcohol abuse who presents with a chief complaint of grossly worsening shortness of breath, nonproductive cough as well as abdominal discomfort and swelling.  He states that he has had trouble laying flat at night and tosses and turns but denies any lower extremity swelling.  He states that symptoms started about a month ago insidiously and have progressively worsened.  Also noted to have some mild abdominal discomfort and nausea over the same interval but was recently prescribed Protonix and Zofran and states that this is alleviated his abdominal discomfort.    He presented to the ED and was found to be afebrile and saturating 90% on room air while at rest but was slightly tachypneic.  Chest x-ray was notable for increased cardiac silhouette from prior and BNP was elevated at 1200.8.  Of note SARS-CoV-2 testing was still pending.  CHF of unknown type given his persistently worsening shortness of breath with orthopnea and elevated BNP along with increased cardiac silhouette.  He was started on diuresis and echocardiogram ordered.  Echocardiogram was done and was severely abnormal and showed an EF of 15 to 20% with severe global hypokinesis with moderately dilated left ventricle and grade 3 diastolic dysfunction with elevated left filling pressures, moderate right ventricular hypokinesis as well as mild LAE, moderate Mitral Regurg and trivial AI and dilated IVC.   Cardiology wasconsulted and recommending changing diuresis because of worsening renal function and planning on cathing the patient on Monday to rule out ischemia. Unfortunately the patient is very averse to getting a cardiac cath so he will undergo a stress Myoview test and this was done today and  still pending read.  IV Diuresis is now stopped because of worsening renal function and because the patient appeared dry so was changed to po this AM. Patient continues to have frequent PVCs including ventricular bigeminy and cardiology considering antiarrhythmics given that the low-dose beta-blocker is ineffective and because the patient is bradycardic.  Cardiology has also added spironolactone 12.5 mg p.o. daily as well as losartan 25 mg p.o. daily S Dr. Stanford Breed does not think the patient can tolerate Entresto at this point and recommending considering it as an outpatient.  They will consider a cardiac MRI after discharge and referral to the CHF clinic in a recommending a follow-up echocardiogram in 3 months.  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) Active Problems:   Acute systolic (congestive) heart failure (HCC)   NSVT (nonsustained ventricular tachycardia) (HCC)   Pre-diabetes   GERD (gastroesophageal reflux disease)   Cough   Hypokalemia   Hyponatremia   Hypochloremia   Elevated ALT measurement   Leukocytosis   Elevated TSH   Acute Combined Systolic and Diastolic CHFExacerbation, improved Cardiomyopathy, Unclear Etiology  -Presents with insidiously worsening SOB with orthopnea, no swelling, no fever, and no chest pain -BNP is elevated to 1201 and cardiac silhouette on CXR is increased from prior -Started Diureses with IV Lasix 40 mg BID and this was changed to IV daily and now completely stopped and patient was transitioned to p.o. Lasix 40 mg p.o. daily altogether.  Cardiology recommended starting p.o. Lasix at 40 mg p.o. daily this was done a few days ago however advanced heart  failure team came to evaluate and still feels that he is volume overloaded so I have written him for IV Lasix 80 mg x 1 -Checked Echocardiogram and showed LVEF 15-20%, severe global hypokinesis with moderately dilated LV, grade 3 DD  with elevated LV filling pressure, moderate RV hypokinesis, mild LAE, moderate MR, trivial AI, dilated IVC -Continue to monitor renal function and electrolytes during diuresis -Continue to Hold on ACE currently and start Entresto Likely when renal function improves; BB to be started and the patient will be started on Carvedilol 3.125 mg twice daily given his frequent PVCs and nonsustained V. tach -Strict I's and O's and daily weights and fluid restrict to 1500 mL's -Patient is -7.325 L and weight is down 1 pound since yesterday -Cycle cardiac troponin and high-sensitivity to rule out ischemia as a cause for suspected CHF; high-sensitivity troponins have relatively been flat -Continue monitor volume status carefully  -Cardiology is consulted for further evaluation and Dr. Debara Pickett felt that the patient has a new onset cardiomyopathy of non-known etiology and likely is nonischemic and appears dilated but recommends a right and left heart cath on Monday to exclude ischemia; patient does not want to proceed with catheterization but is agreeable to a Lexiscan Myoview which was done today -Lexiscan Myoview results showed "Imaging findings are consistent with transient ischemic dilatation. While nonspecific, this can be seen in the setting of diffuse endocardial reversible ischemia as can be seen with multivessel coronary artery disease. Additionally, there is a small area of relatively more severe reversible ischemia along the apically anterior and anteroseptal walls along the distribution of the left anterior descending coronary artery. Marked left ventricular dilation with severe global hypokinesis. eft ventricular ejection fraction 16%."  -Discussed the case with cardiologist Dr. Nechama Guard yesterday who recommended empirically starting the patient on high intensity statin with atorvastatin 80 mg p.o. nightly as well as starting the patient on aspirin 81 mg p.o. daily until further work-up is revealing -Patient  continues to refuse cardiac catheterization even after extensive discussion so advanced heart failure team was consulted and Dr. Haroldine Laws is planning to do a coronary CTA today and considering a cardiac MRI to further evaluate; If the Cardiomyopathy is not secondary to CAD they they feel and suspect the cardiomyopathy may be related to frequent PVCs unable to quantify on telemetry; coronary CTA I recommended the patient undergoing catheterization please see cardiology read for full results -Cardiology starting spironolactone 12.5 mg p.o. daily, losartan 25 mg p.o. daily as well; blood pressure remains on the lower side and they stated they were going to add digoxin but orders looks like they have placed our Ivabradine -Also was given a dose of Nitrostat 4 mg sublingual once -Cardiology feels that the patient blood pressure at this point cannot tolerate Entresto and are recommending to be changed in the outpatient setting if possible  Hyperglycemia in the setting of Pre-Diabetes -Patient's blood sugar on admission was 116 and repeat was 126; CBGs have been running from 105-126 and today was 108 on CMP -Checked Hemoglobin A1c and was 5.9 -Continue to monitor and trend blood sugars carefully and if necessary will need to add sensitive NovoLog sliding scale insulin AC  ? Pericardial Effusion, ruled out -There is mention of the possible pericardial effusion on chest x-ray -Echocardiogram done showed there is no evidence of any pericardial effusion  NSVT -Has been having frequent PVCs  -Cardiology planning on starting low-dose beta-blocker with carvedilol 3.125 mg p.o. twice daily and recommending continuing for now  and titrating as an outpatient given no history of syncope -We will need to Continue to monitor telemetry -Allergy will quantify on telemetry and if it is greater than 10% they will need amiodarone to suppress -They were considering a sleep study due to this underlying cause -They are  going to add digoxin for his heart failure but patient was given Ivbradine earlier   GERD -C/w Pantoprazole 40 mg po Daily   Cough -In the setting of pulmonary edema and CHF, improving -Continue with benzonatate and guaifenesin  AKI/Renal Insufficiency, improved now -Patient's BUN/creatinine are seen with diuresis and went from 16/1.16 -> 15/1.22 -> 18/1.32 -> 21/1.37 -> 19/1.26 -> 19/1.18 -IV diuresis was stopped by cardiology the day before yesterdayand he was transitioned to p.o. 40 mg Lasix but now Cardiology stopped that and has given him IV Lasix 80 mg x1 -Avoid Nephrotoxic Medications, Contrast Dyes, as well as Hypotension if possible -Continue to Monitor and Trend Renal Fxn -Repeat CMP in AM    Hyponatremia, hypochloremia, improved -In the setting of diuresis -Mild and sodium was 133 and chloride was 93; now sodium is 135 and chloride is 99 -Continue monitor and trend and IV diuresis now been stopped -Repeat CMP in the a.m.  Hypokalemia -In the setting of diuresis -Patient potassium is now 4.2 -Replete with p.o. potassium chloride 40 mg twice daily x2 doses yesterday -Continue monitor replete as necessary -Repeat CMP in a.m.  Elevated LFT, improved -AST was 34 and ALT is slightly elevated at 45; AST is now 25 and ALT is now 34 and improved -Likely reactive and will continue to monitor and trend -Repeat CMP intermittently   Leukocytosis, improved -Today WBC was 10.3 -Continue to monitor for signs and symptoms of infection; currently no evidence of infection exhibited and patient is afebrile -Repeat CBC in a.m.  Elevated TSH; suspected subclinical hypothyroidism -Likely reactive and TSH was 9.490 -T4 was within normal limits at 0.65 but is on the low end of normal -Check free T3 and was 132 -We will hold off on treating and continue to monitor and recommend repeating LFTs in 4 to 6 weeks  Mitral Regurgitation -Was noted to be moderate -They are recommending  reassessment with an echocardiogram once cardiac medications are fully titrated in 3 months  ADDENDUM 16:45:  Received a call from the radiology department after the over read for his coronary CTA and showed infection or aspiration on the right lobe and 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  Patient was started on IV Unasyn, added a flutter valve, incentive spirometry as well as guaifenesin.  Procalcitonin level as well as ESR and CRP will be checked.  I discussed the case with Dr. Chesley Mires who will see the patient in consultation for further evaluation    DVT prophylaxis: Enoxaparin 40 mg sq q24h Code Status: FULL CODE Family Communication: No family present at bedside  Disposition Plan: Anticipating discharge home tomorrow pending cardiac clearance  Consultants:   Cardiology  Advanced heart failure team  Pulmonary  Procedures:  ECHOCARDIOGRAM IMPRESSIONS    1. The left ventricle has a visually estimated ejection fraction of 15-20%. The cavity size was moderately dilated. Findings are consistent with dilated cardiomyopathy. Left ventricular diastolic Doppler parameters are consistent with restrictive  filling. Elevated left atrial and left ventricular end-diastolic pressures Left ventricular diffuse hypokinesis.  2. The right ventricle has moderately reduced systolic function. The cavity was normal. There is no increase in right ventricular wall thickness.  3. Left atrial  size was mildly dilated.  4. The mitral valve is abnormal. Mild thickening of the mitral valve leaflet. Mitral valve regurgitation is moderate by color flow Doppler.  5. The tricuspid valve is grossly normal.  6. The aortic valve is abnormal. Mild thickening of the aortic valve. Aortic valve regurgitation is trivial by color flow Doppler. No stenosis of the aortic valve.  7. The aorta is normal unless otherwise noted.  8. The inferior vena cava was dilated in size with <50% respiratory  variability.  SUMMARY   LVEF 15-20%, severe global hypokinesis with moderately dilated LV, grade 3 DD with elevated LV filling pressure, moderate RV hypokinesis, mild LAE, moderate MR, trivial AI, dilated IVC  FINDINGS  Left Ventricle: The left ventricle has a visually estimated ejection fraction of 15-20. The cavity size was moderately dilated. There is no increase in left ventricular wall thickness. Findings are consistent with dilated cardiomyopathy. Left  ventricular diastolic Doppler parameters are consistent with restrictive filling. Elevated left atrial and left ventricular end-diastolic pressures Left ventricular diffuse hypokinesis.  Right Ventricle: The right ventricle has moderately reduced systolic function. The cavity was normal. There is no increase in right ventricular wall thickness.  Left Atrium: Left atrial size was mildly dilated.  Right Atrium: Right atrial size was normal in size. Right atrial pressure is estimated at 10 mmHg.  Interatrial Septum: No atrial level shunt detected by color flow Doppler.  Pericardium: There is no evidence of pericardial effusion.  Mitral Valve: The mitral valve is abnormal. Mild thickening of the mitral valve leaflet. Mitral valve regurgitation is moderate by color flow Doppler.  Tricuspid Valve: The tricuspid valve is grossly normal. Tricuspid valve regurgitation was not visualized by color flow Doppler.  Aortic Valve: The aortic valve is abnormal Mild thickening of the aortic valve. Aortic valve regurgitation is trivial by color flow Doppler. There is No stenosis of the aortic valve.  Pulmonic Valve: The pulmonic valve was grossly normal. Pulmonic valve regurgitation is not visualized by color flow Doppler.  Aorta: The aorta is normal unless otherwise noted.  Venous: The inferior vena cava is dilated in size with less than 50% respiratory variability.    +--------------+--------++  LEFT VENTRICLE                  +--------------+---------++ +--------------+--------++       Diastology                  PLAX 2D                        +--------------+---------++ +--------------+--------++       LV e' lateral: 6.31 cm/s    LV EF:         15-20 %         +--------------+---------++ +--------------+--------++       LV e' medial:  5.66 cm/s    LVIDd:         6.50 cm         +--------------+---------++ +--------------+--------++  LVIDs:         5.50 cm    +--------------+--------++  LV PW:         0.90 cm    +--------------+--------++  LV IVS:        0.80 cm    +--------------+--------++  LVOT diam:     2.30 cm    +--------------+--------++  LV SV:         69 ml      +--------------+--------++  LV SV Index:   32.20      +--------------+--------++  LVOT Area:     4.15 cm   +--------------+--------++                            +--------------+--------++   +------------------+---------++  LV Volumes (MOD)               +------------------+---------++  LV area d, A4C:    46.30 cm   +------------------+---------++  LV area s, A4C:    40.40 cm   +------------------+---------++  LV major d, A4C:   9.68 cm     +------------------+---------++  LV major s, A4C:   8.84 cm     +------------------+---------++  LV vol d, MOD A4C: 180.0 ml    +------------------+---------++  LV vol s, MOD A4C: 150.0 ml    +------------------+---------++  LV SV MOD A4C:     180.0 ml    +------------------+---------++  +---------------+------++  RIGHT VENTRICLE          +---------------+------++  TAPSE (M-mode): 1.5 cm   +---------------+------++  +---------------+-------++-----------++  LEFT ATRIUM              Index         +---------------+-------++-----------++  LA diam:        4.80 cm  2.28 cm/m    +---------------+-------++-----------++  LA Vol (A2C):   81.0 ml  38.45 ml/m   +---------------+-------++-----------++  LA Vol (A4C):   74.3 ml  35.27 ml/m   +---------------+-------++-----------++  LA  Biplane Vol: 78.6 ml  37.32 ml/m   +---------------+-------++-----------++ +------------+----------++-----------++  RIGHT ATRIUM             Index         +------------+----------++-----------++  RA Pressure: 15.00 mmHg                +------------+----------++-----------++  RA Area:     16.70 cm                 +------------+----------++-----------++  RA Volume:   43.70 ml    20.75 ml/m   +------------+----------++-----------++    +-------------+-------++  AORTA                   +-------------+-------++  Ao Root diam: 3.20 cm   +-------------+-------++  +---------------+----------++  TRICUSPID VALVE              +---------------+----------++  Estimated RAP:  15.00 mmHg   +---------------+----------++   +--------------+-------+  SHUNTS                  +--------------+-------+  Systemic Diam: 2.30 cm  +--------------+-------+  NM LEXISCAN MYOVIEW STRESS TEST IMPRESSION: 1. Imaging findings are consistent with transient ischemic dilatation. While nonspecific, this can be seen in the setting of diffuse endocardial reversible ischemia as can be seen with multivessel coronary artery disease. Additionally, there is a small area of relatively more severe reversible ischemia along the apically anterior and anteroseptal walls along the distribution of the left anterior descending coronary artery.  2. Marked left ventricular dilation with severe global hypokinesis.  3. Left ventricular ejection fraction 16%  4. Non invasive risk stratification*: High   Antimicrobials:  Anti-infectives (From admission, onward)   None     Subjective: Seen and examined at bedside and he was playing on his phone and he denied any chest pain, lightheadedness or dizziness.  No nausea or vomiting.  No other concerns or complaints at this  time and still does not want to go for cardiac catheterization but is agreeable to a coronary CTA.   Objective: Vitals:   12/13/18 1930 12/14/18  0005 12/14/18 0457 12/14/18 0733  BP: 90/74 93/76 102/68 104/77  Pulse: 90 86 69 84  Resp: _0 Temp: 97.8 F (36.6 C) 97.8 F (36.6 C) 98.1 F (36.7 C) 98.2 F (36.8 C)  TempSrc: Oral Oral Oral Oral  SpO2: 94% 93%  90%  Weight:   87.2 kg   Height:        Intake/Output Summary (Last 24 hours) at 12/14/2018 1136 Last data filed at 12/14/2018 1497 Gross per 24 hour  Intake 960 ml  Output 1025 ml  Net -65 ml   Filed Weights   12/12/18 0430 12/13/18 0432 12/14/18 0457  Weight: 87.7 kg 87.9 kg 87.2 kg   Examination: Physical Exam:  Constitutional: Well-nourished, well-developed overweight Caucasian male currently no acute distress but was agitated this morning and he was cursing at somebody over the phone Eyes: Lids and conjunctive are normal.  Sclera icteric ENMT: External ears and nose appear normal.  Grossly normal hearing Neck: Appears supple no JVD Respiratory: Diminished auscultation bilaterally with no appreciable wheezing, rales, rhonchi.  Patient is not tachypneic or using any accessory muscles to breathe Cardiovascular: Slightly tachycardic but regular rate.  Mild lower extremity edema noted today Abdomen: Soft, nontender, distended secondary to body habitus.  Bowel sounds GU: Deferred Musculoskeletal: No contractures or cyanosis.  No joint deformities in the upper and lower extremities Skin: Skin is warm and dry with no appreciable rashes or lesions on to skin evaluation Neurologic: Cranial nerves II through XII grossly intact no appreciable focal deficits Psychiatric: Slightly agitated this morning but he is awake, alert, oriented  Data Reviewed: I have personally reviewed following labs and imaging studies  CBC: Recent Labs  Lab 12/10/18 1154 12/11/18 0511 12/12/18 0519 12/13/18 1237 12/14/18 0512  WBC 9.8 10.5 11.4* 9.6 10.3  NEUTROABS 6.7 6.0 6.9 6.1 6.2  HGB 14.6 16.4 16.0 15.8 16.0  HCT 45.9 50.5 49.0 48.9 49.4  MCV 93.3 91.8 90.6 91.4 92.2    PLT 220 228 238 243 026   Basic Metabolic Panel: Recent Labs  Lab 12/10/18 1154 12/10/18 1925 12/11/18 0511 12/12/18 0519 12/13/18 1237 12/14/18 0512  NA 137 138 137 133* 136 135  K 3.9 3.7 4.3 3.4* 3.8 4.2  CL 99 97* 98 93* 101 99  CO2 _1 GLUCOSE 116* 114* 105* 117* 134* 108*  BUN _2 21* 19 19  CREATININE 1.16 1.22 1.32* 1.37* 1.26* 1.18  CALCIUM 8.9 9.5 9.5 9.1 9.0 9.0  MG 2.1 2.1 2.1 2.0 2.1 2.2  PHOS 3.1  --  4.2 4.6 3.3 3.8   GFR: Estimated Creatinine Clearance: 80.4 mL/min (by C-G formula based on SCr of 1.18 mg/dL). Liver Function Tests: Recent Labs  Lab 12/09/18 1947 12/10/18 1154 12/11/18 0511 12/12/18 0519 12/13/18 1237  AST _3 34 25  ALT 32 29 34 45* 34  ALKPHOS 77 76 73 79 78  BILITOT 0.7 0.7 0.8 1.2 1.1  PROT 7.4 7.2 7.9 7.6 7.1  ALBUMIN 3.8 3.7 3.7 3.7 3.6   Recent Labs  Lab 12/09/18 1947  LIPASE 31   No results for input(s): AMMONIA in the last 168 hours. Coagulation Profile: No results for input(s): INR, PROTIME in the last 168 hours. Cardiac Enzymes: No results for input(s): CKTOTAL, CKMB, CKMBINDEX,  TROPONINI in the last 168 hours. BNP (last 3 results) No results for input(s): PROBNP in the last 8760 hours. HbA1C: No results for input(s): HGBA1C in the last 72 hours. CBG: No results for input(s): GLUCAP in the last 168 hours. Lipid Profile: No results for input(s): CHOL, HDL, LDLCALC, TRIG, CHOLHDL, LDLDIRECT in the last 72 hours. Thyroid Function Tests: Recent Labs    12/11/18 1400 12/12/18 0811  TSH 9.490*  --   FREET4  --  0.65   Anemia Panel: No results for input(s): VITAMINB12, FOLATE, FERRITIN, TIBC, IRON, RETICCTPCT in the last 72 hours. Sepsis Labs: No results for input(s): PROCALCITON, LATICACIDVEN in the last 168 hours.  Recent Results (from the past 240 hour(s))  SARS CORONAVIRUS 2 Nasal Swab Aptima Multi Swab     Status: None   Collection Time: 12/10/18  4:43 AM   Specimen: Aptima  Multi Swab; Nasal Swab  Result Value Ref Range Status   SARS Coronavirus 2 NEGATIVE NEGATIVE Final    Comment: (NOTE) SARS-CoV-2 target nucleic acids are NOT DETECTED. The SARS-CoV-2 RNA is generally detectable in upper and lower respiratory specimens during the acute phase of infection. Negative results do not preclude SARS-CoV-2 infection, do not rule out co-infections with other pathogens, and should not be used as the sole basis for treatment or other patient management decisions. Negative results must be combined with clinical observations, patient history, and epidemiological information. The expected result is Negative. Fact Sheet for Patients: SugarRoll.be Fact Sheet for Healthcare Providers: https://www.woods-mathews.com/ This test is not yet approved or cleared by the [place] FDA and  has been authorized for detection and/or diagnosis of SARS-CoV-2 by FDA under an Emergency Use Authorization (EUA). This EUA will remain  in effect (meaning this test can be used) for the duration of the COVID-19 declaration under Section 56 4(b)(1) of the Act, 21 U.S.C. section 360bbb-3(b)(1), unless the authorization is terminated or revoked sooner. Performed at [facility], [place] [address], [place], [place] [address]   MRSA PCR Screening     Status: None   Collection Time: 12/10/18  6:24 PM   Specimen: Nasopharyngeal  Result Value Ref Range Status   MRSA by PCR NEGATIVE NEGATIVE Final    Comment:        The GeneXpert MRSA Assay (FDA approved for NASAL specimens only), is one component of a comprehensive MRSA colonization surveillance program. It is not intended to diagnose MRSA infection nor to guide or monitor treatment for MRSA infections. Performed at [facility], [place] [address], [place], [place] [address]     Radiology Studies: Nm Myocar Multi W/spect W/wall Motion / Ef  Result Date: 12/13/2018 CLINICAL DATA:   53 year old male with shortness of breath and cardiomyopathy, possibly nonischemic. EXAM: MYOCARDIAL IMAGING WITH SPECT (REST AND PHARMACOLOGIC-STRESS) GATED LEFT VENTRICULAR WALL MOTION STUDY LEFT VENTRICULAR EJECTION FRACTION TECHNIQUE: Standard myocardial SPECT imaging was performed after resting intravenous injection of 10 mCi [id number]. Subsequently, intravenous infusion of Lexiscan was performed under the supervision of the Cardiology staff. At peak effect of the drug, 30 mCi [id number] was injected intravenously and standard myocardial SPECT imaging was performed. Quantitative gated imaging was also performed to evaluate left ventricular wall motion, and estimate left ventricular ejection fraction. COMPARISON:  Prior chest x-ray 12/13/2018 FINDINGS: Perfusion: Patchy areas of decreased radiotracer uptake at the ventricular apex and apically anteroseptal wall which worsen following pharmacological stress. Additionally, there is diffuse mild transient ischemic dilatation suggesting diffuse reversible ischemia throughout the  endocardium. Wall Motion: Marked left ventricular dilatation and global hypokinesis. Left Ventricular Ejection Fraction: 16 % End diastolic volume 973 ml End systolic volume 532 ml IMPRESSION: 1. Imaging findings are consistent with transient ischemic dilatation. While nonspecific, this can be seen in the setting of diffuse endocardial reversible ischemia as can be seen with multivessel coronary artery disease. Additionally, there is a small area of relatively more severe reversible ischemia along the apically anterior and anteroseptal walls along the distribution of the left anterior descending coronary artery. 2. Marked left ventricular dilation with severe global hypokinesis. 3. Left ventricular ejection fraction 16% 4. Non invasive risk stratification*: High *2012 Appropriate Use Criteria for Coronary Revascularization Focused Update: J Am Coll Cardiol. 9924;26(8):341-962.  http://content.airportbarriers.com.aspx?articleid=[email or web address] These results were called by telephone at the time of interpretation on 12/13/2018 at 2:57 pm to Dr. Alfredia Ferguson, who verbally acknowledged these results. Electronically Signed   By: Jacqulynn Cadet M.D.   On: 12/13/2018 14:57   Dg Chest Port 1 View  Result Date: 12/13/2018 CLINICAL DATA:  Dyspnea EXAM: PORTABLE CHEST 1 VIEW COMPARISON:  12/10/2018 chest radiograph. FINDINGS: Stable cardiomediastinal silhouette with normal heart size. No pneumothorax. No pleural effusion. Minimal hazy right middle lobe opacity. No pulmonary edema. Clear left lung. IMPRESSION: Minimal hazy right middle lobe opacity, similar to prior, favor minimal scarring or atelectasis. Otherwise no active disease. Electronically Signed   By: Ilona Sorrel M.D.   On: 12/13/2018 08:09   Scheduled Meds:  aspirin EC  81 mg Oral Daily   atorvastatin  80 mg Oral q1800   carvedilol  3.125 mg Oral BID   enoxaparin (LOVENOX) injection  40 mg Subcutaneous Q24H   guaiFENesin  1,200 mg Oral BID   losartan  25 mg Oral Daily   pantoprazole  40 mg Oral Daily   sodium chloride flush  3 mL Intravenous Q12H   spironolactone  12.5 mg Oral Daily   Continuous Infusions:  sodium chloride      LOS: 3 days   Kerney Elbe, DO [facility] PAGER is on AMION  If 7PM-7AM, please contact night-coverage [email or web address] Password [facility] 12/14/2018, 11:36 AM

## 2018-12-14 NOTE — Progress Notes (Signed)
Jane from radiology called for report of pt's procedure. Report relayed to [facility] MD.

## 2018-12-14 NOTE — Progress Notes (Signed)
Pharmacy Antibiotic Note  MANDEL SEIDEN is a 53 y.o. male admitted on 12/09/2018 with HF exacerbation now with concern for aspiration pneumonia on the R lobe seen on coronary CTA.  Pharmacy has been consulted for Unasyn dosing.   WBC 10.3, afebrile. Procalcitonin, CRP, sed rate ordered by primary.  Plan: Unasyn 3 g IV q8h Monitor cultures, renal function, length of therapy  Height: 6' (182.9 cm) Weight: 192 lb 3.2 oz (87.2 kg)(scale c) IBW/kg (Calculated) : 77.6  Temp (24hrs), Avg:97.9 F (36.6 C), Min:97.3 F (36.3 C), Max:98.2 F (36.8 C)  Recent Labs  Lab 12/10/18 1154 12/10/18 1925 12/11/18 0511 12/12/18 0519 12/13/18 1237 12/14/18 0512  WBC 9.8  --  10.5 11.4* 9.6 10.3  CREATININE 1.16 1.22 1.32* 1.37* 1.26* 1.18    Estimated Creatinine Clearance: 80.4 mL/min (by C-G formula based on SCr of 1.18 mg/dL).    No Known Allergies  Antimicrobials this admission: Unasyn 8/18 >>  Dose adjustments this admission: None   Microbiology results: 8/14 MRSA PCR: neg 8/14 COVID: neg  Thank you for allowing pharmacy to be a part of this patient's care.  Vertis Kelch, PharmD PGY2 Cardiology Pharmacy Resident Phone [phone] 12/14/2018       4:52 PM  Please check [email or web address] for unit-specific pharmacist phone numbers

## 2018-12-14 NOTE — Progress Notes (Signed)
Patient will need follow up at [facility] clinic, and will need charity set up for [facility] for CHF  Disease management.

## 2018-12-14 NOTE — Progress Notes (Addendum)
CM talked to patient about no medical insurance/ no PCP; pt stated that he has Medicaid; pharmacy of choice is [facility] on [address]; No PCP, at discharge referral to be made at [facility]; CM/SW will continue to follow for progression of care; [place] Supervisor  CM following pt today is Wendi Maya CM [phone]) please contact her for questions

## 2018-12-15 LAB — C-REACTIVE PROTEIN: CRP: <0.8 mg/dL (ref <1.0)

## 2018-12-15 LAB — COMPREHENSIVE METABOLIC PANEL
ALT: 27 U/L (ref 0–44)
AST: 19 U/L (ref 15–41)
Albumin: 3.5 g/dL (ref 3.5–5.0)
Alkaline Phosphatase: 75 U/L (ref 38–126)
Anion gap: 14 (ref 5–15)
BUN: 24 mg/dL — ABNORMAL HIGH (ref 6–20)
CO2: 23 mmol/L (ref 22–32)
Calcium: 9 mg/dL (ref 8.9–10.3)
Chloride: 98 mmol/L (ref 98–111)
Creatinine, Ser: 1.29 mg/dL — ABNORMAL HIGH (ref 0.61–1.24)
GFR calc Af Amer: >60 mL/min (ref >60)
GFR calc non Af Amer: >60 mL/min (ref >60)
Glucose, Bld: 110 mg/dL — ABNORMAL HIGH (ref 70–99)
Potassium: 3.4 mmol/L — ABNORMAL LOW (ref 3.5–5.1)
Sodium: 135 mmol/L (ref 135–145)
Total Bilirubin: 1.1 mg/dL (ref 0.3–1.2)
Total Protein: 7.3 g/dL (ref 6.5–8.1)

## 2018-12-15 LAB — CBC WITH DIFFERENTIAL/PLATELET
Abs Immature Granulocytes: 0.07 10*3/uL (ref 0.00–0.07)
Basophils Absolute: 0.1 10*3/uL (ref 0.0–0.1)
Basophils Relative: 1 %
Eosinophils Absolute: 0.5 10*3/uL (ref 0.0–0.5)
Eosinophils Relative: 4 %
HCT: 47.3 % (ref 39.0–52.0)
Hemoglobin: 15.5 g/dL (ref 13.0–17.0)
Immature Granulocytes: 1 %
Lymphocytes Relative: 23 %
Lymphs Abs: 2.5 10*3/uL (ref 0.7–4.0)
MCH: 29.4 pg (ref 26.0–34.0)
MCHC: 32.8 g/dL (ref 30.0–36.0)
MCV: 89.8 fL (ref 80.0–100.0)
Monocytes Absolute: 0.9 10*3/uL (ref 0.1–1.0)
Monocytes Relative: 8 %
Neutro Abs: 6.9 10*3/uL (ref 1.7–7.7)
Neutrophils Relative %: 63 %
Platelets: 231 10*3/uL (ref 150–400)
RBC: 5.27 MIL/uL (ref 4.22–5.81)
RDW: 13 % (ref 11.5–15.5)
WBC: 10.8 10*3/uL — ABNORMAL HIGH (ref 4.0–10.5)
nRBC: 0 % (ref 0.0–0.2)

## 2018-12-15 LAB — PHOSPHORUS: Phosphorus: 4.6 mg/dL (ref 2.5–4.6)

## 2018-12-15 LAB — SEDIMENTATION RATE: Sed Rate: 9 mm/hr (ref 0–16)

## 2018-12-15 LAB — PROCALCITONIN: Procalcitonin: 0.11 ng/mL

## 2018-12-15 LAB — MAGNESIUM: Magnesium: 2.1 mg/dL (ref 1.7–2.4)

## 2018-12-15 MED ORDER — POTASSIUM CHLORIDE CRYS ER 20 MEQ PO TBCR
40.0000 meq | EXTENDED_RELEASE_TABLET | Freq: Once | ORAL | Status: AC
  Administered 2018-12-15: 40 meq via ORAL
  Filled 2018-12-15: qty 2

## 2018-12-15 MED ORDER — SODIUM CHLORIDE 0.9 % IV SOLN
INTRAVENOUS | Status: DC
  Administered 2018-12-16: 06:00:00 via INTRAVENOUS

## 2018-12-15 MED ORDER — ASPIRIN 81 MG PO CHEW
81.0000 mg | CHEWABLE_TABLET | ORAL | Status: AC
  Administered 2018-12-16: 81 mg via ORAL
  Filled 2018-12-15: qty 1

## 2018-12-15 MED ORDER — SODIUM CHLORIDE 0.9% FLUSH: 3.0000 mL | INTRAVENOUS | Status: DC | PRN

## 2018-12-15 MED ORDER — SODIUM CHLORIDE 0.9% FLUSH
3.0000 mL | Freq: Two times a day (BID) | INTRAVENOUS | Status: DC
  Administered 2018-12-15 – 2018-12-19 (×6): 3 mL via INTRAVENOUS

## 2018-12-15 MED ORDER — SODIUM CHLORIDE 0.9 % IV SOLN: 250.0000 mL | INTRAVENOUS | Status: DC | PRN

## 2018-12-15 MED ORDER — AMIODARONE HCL 200 MG PO TABS
200.0000 mg | ORAL_TABLET | Freq: Two times a day (BID) | ORAL | Status: DC
  Administered 2018-12-15 – 2018-12-20 (×12): 200 mg via ORAL
  Filled 2018-12-15 (×12): qty 1

## 2018-12-15 NOTE — Progress Notes (Signed)
  I spoke with patient and his roommate again this afternoon and we discussed cath and its risks.   He states that he remains nervous about it but agrees to proceed. Will schedule for 830a. I have given his roommate a note so that he can enter the building early tomorrow and be in the waiting room for cath.   Will need eventual CT-guided needle biopsy of L lung nodule as well. If needs stent may be best to stage procedure so we do biopsy prior to starting DAPT. We will see.   Glori Bickers, MD  10:55 PM

## 2018-12-15 NOTE — Progress Notes (Signed)
Dinamap malfunction, patient's HR is 80.

## 2018-12-15 NOTE — Progress Notes (Signed)
Advanced Heart Failure Rounding Note   Subjective:    Chest CT yesterday with suggestion of severe CAD with totally occluded LAD and high-grade ramus lesion. Also LLL nodule suggestive of lung CA  Feels better today. No CP or SOB. Orthopnea resolved. Still with frequent PVCs.    Objective:   Weight Range:  Vital Signs:   Temp:  [98.1 F (36.7 C)-98.8 F (37.1 C)] 98.8 F (37.1 C) (08/19 0947) Pulse Rate:  [66-87] 87 (08/19 0947) Resp:  [18-19] 19 (08/19 0430) BP: (85-118)/(63-77) 108/75 (08/19 0947) SpO2:  [93 %-100 %] 97 % (08/19 0947) Weight:  [86.3 kg] 86.3 kg (08/19 0430) Last BM Date: 12/12/18  Weight change: Filed Weights   12/13/18 0432 12/14/18 0457 12/15/18 0430  Weight: 87.9 kg 87.2 kg 86.3 kg    Intake/Output:   Intake/Output Summary (Last 24 hours) at 12/15/2018 1516 Last data filed at 12/15/2018 1200 Gross per 24 hour  Intake 2005.26 ml  Output 1525 ml  Net 480.26 ml     Physical Exam: General:  Unkempt  No resp difficulty HEENT: normal Neck: supple. JVP 6-7 . Carotids 2+ bilat; no bruits. No lymphadenopathy or thryomegaly appreciated. Cor: PMI nondisplaced. Regular rate & rhythm + frequent ectopy. +s3 . Lungs: clear Abdomen: soft, nontender, nondistended. No hepatosplenomegaly. No bruits or masses. Good bowel sounds. Extremities: no cyanosis, clubbing, rash, edema Neuro: alert & orientedx3, cranial nerves grossly intact. moves all 4 extremities w/o difficulty. Affect pleasant  Telemetry: NSR 80s with frequent polymorphic PVCs ~20/min Personally reviewed   Labs: Basic Metabolic Panel: Recent Labs  Lab 12/11/18 0511 12/12/18 0519 12/13/18 1237 12/14/18 0512 12/15/18 0513  NA 137 133* 136 135 135  K 4.3 3.4* 3.8 4.2 3.4*  CL 98 93* 101 99 98  CO2 29 27 25 26 23   GLUCOSE 105* 117* 134* 108* 110*  BUN 18 21* 19 19 24*  CREATININE 1.32* 1.37* 1.26* 1.18 1.29*  CALCIUM 9.5 9.1 9.0 9.0 9.0  MG 2.1 2.0 2.1 2.2 2.1  PHOS 4.2 4.6 3.3 3.8  4.6    Liver Function Tests: Recent Labs  Lab 12/10/18 1154 12/11/18 0511 12/12/18 0519 12/13/18 1237 12/15/18 0513  AST 21 27 34 25 19  ALT 29 34 45* 34 27  ALKPHOS 76 73 79 78 75  BILITOT 0.7 0.8 1.2 1.1 1.1  PROT 7.2 7.9 7.6 7.1 7.3  ALBUMIN 3.7 3.7 3.7 3.6 3.5   Recent Labs  Lab 12/09/18 1947  LIPASE 31   No results for input(s): AMMONIA in the last 168 hours.  CBC: Recent Labs  Lab 12/11/18 0511 12/12/18 0519 12/13/18 1237 12/14/18 0512 12/15/18 0513  WBC 10.5 11.4* 9.6 10.3 10.8*  NEUTROABS 6.0 6.9 6.1 6.2 6.9  HGB 16.4 16.0 15.8 16.0 15.5  HCT 50.5 49.0 48.9 49.4 47.3  MCV 91.8 90.6 91.4 92.2 89.8  PLT 228 238 243 247 231    Cardiac Enzymes: No results for input(s): CKTOTAL, CKMB, CKMBINDEX, TROPONINI in the last 168 hours.  BNP: BNP (last 3 results) Recent Labs    12/10/18 0248  BNP 1,200.8*    ProBNP (last 3 results) No results for input(s): PROBNP in the last 8760 hours.    Other results:  Imaging: Ct Coronary Morph W/cta Cor W/score W/ca W/cm &/or Wo/cm  Addendum Date: 12/14/2018   ADDENDUM REPORT: 12/14/2018 16:25 CLINICAL DATA:  53 year old male with new diagnosis of acute systolic CHF and an abnormal stress test. EXAM: Cardiac/Coronary  CTA TECHNIQUE: The patient  was scanned on a [employer]. FINDINGS: A 100 kV prospective scan was triggered in the descending thoracic aorta at 111 HU's. Axial non-contrast 3 mm slices were carried out through the heart. The data set was analyzed on a dedicated work station and scored using the [place]. Gantry rotation speed was 250 msecs and collimation was .6 mm. 15 mg of PO Ivabradine, 10 mg of iv Metoprolol and 0.4 Mg of sl NTG was given. The 3D data set was reconstructed in 5% intervals of the 67-82 % of the R-R cycle. Diastolic phases were analyzed on a dedicated work station using MPR, MIP and VRT modes. The patient received 80 cc of contrast. Aorta: Normal size. Mild diffuse  atherosclerotic disease and calcifications. No dissection. Aortic Valve:  Trileaflet.  No calcifications. Coronary Arteries:  Normal coronary origin.  Right dominance. RCA is a large dominant artery that gives rise to PDA and PLA. RCA is affected by motion by there is only minimal diffuse plaque, PDA and PLA are small lumen arteries that are not well visualized. Left main is a large artery that gives rise to LAD, a large ramus intermedius and LCX arteries. Left main has minimal plaque. LAD is a large vessel that gives rise to one diagonal artery. Proximal LAD has severe, predominantly non-calcified plaque with subtotal stenosis > 99%. This is followed by another severe predominantly calcified plaque with severe stenosis. Mid and distal LAD have very small lumen most probably sec to proximal stenosis. D1 is a small lumen artery with mild calcified ostial plaque with stenosis 25-49%. Ramus intermedius is a large artery that has moderate diffuse mixed plaque. Interpretation is affected by motion but there is possibly severe stenosis in the mid portion. LCX is a small non-dominant artery that has mild diffuse calcified plaque. Other findings: Normal pulmonary vein drainage into the left atrium. Normal left atrial appendage without a thrombus. Normal size of the pulmonary artery. IMPRESSION: 1. Coronary calcium score of 120. This was 73 percentile for age and sex matched control. 2. Study quality is affected significantly by motion secondary to frequent ventricular ectopy. 3. Normal coronary origin with right dominance. 4. Proximal LAD has severe, predominantly non-calcified plaque with subtotal stenosis > 99%. This is followed by another severe predominantly calcified plaque with severe stenosis. Interpretation of ramus intermedius is affected by motion but there is possibly severe stenosis in the mid portion. CAD-RADS 4 Severe stenosis. (70-99%). Cardiac catheterization is recommended. Electronically Signed   By:  Ena Dawley   On: 12/14/2018 16:25   Result Date: 12/14/2018 EXAM: OVER-READ INTERPRETATION  CT CHEST The following report is an over-read performed by radiologist Dr. Abigail Miyamoto of [facility], [place] on 12/14/2018. This over-read does not include interpretation of cardiac or coronary anatomy or pathology. The coronary CTA interpretation by the cardiologist is attached. COMPARISON:  Chest radiograph 12/13/2018.  No prior CT. FINDINGS: Vascular: Normal aortic caliber with mild aortic atherosclerosis. No central pulmonary embolism, on this non-dedicated study. Mediastinum/Nodes: Prominent but not pathologically enlarged lower mediastinal nodes, including a node of 8 mm within the azygoesophageal recess. Borderline right hilar adenopathy at 1.3 cm on 07/11. Lungs/Pleura: No imaged pleural fluid. Dependent right lower lobe ground-glass and airspace disease. Left lower lobe more nodular opacity with mild surrounding ground-glass. Example 2.5 x 2.0 cm on image 14/5. Upper Abdomen: Normal imaged portions of the liver, spleen, stomach. Musculoskeletal: No acute osseous abnormality. IMPRESSION: 1. Right lower lobe airspace disease, suspicious for infection or aspiration. 2. Left  lower lobe pulmonary nodule. This is suspicious for primary bronchogenic carcinoma. Given the right lower lobe airspace disease, infectious etiology is possible but felt less likely. Consider antibiotic therapy and CT follow-up at 2-3 weeks. 3. Borderline right hilar adenopathy, most likely reactive. 4.  Aortic Atherosclerosis (ICD10-I70.0). These results will be called to the ordering clinician or representative by the Radiologist Assistant, and communication documented in the PACS or zVision Dashboard. Electronically Signed: By: Abigail Miyamoto M.D. On: 12/14/2018 15:56   Ct Coronary Fractional Flow Reserve Fluid Analysis  Result Date: 12/15/2018 EXAM: CT FFR ANALYSIS CLINICAL DATA:  53 year old male with new diagnosis of  cardiomyopathy and abnormal coronary CTA. FINDINGS: FFRct analysis was performed on the original cardiac CT angiogram dataset. Diagrammatic representation of the FFRct analysis is provided in a separate PDF document in PACS. This dictation was created using the PDF document and an interactive 3D model of the results. 3D model is not available in the EMR/PACS. Normal FFR range is >0.80. 1. Left Main: 0.99. 2. LAD: Ostial: 0.99.  Proximal: Occluded. 3. Ramus intermedius: Ostial; 0.99.  Proximal: < 0.5 4. LCX: 0.95. 5. RCA: Proximal: 0.99, distal: 0.94 IMPRESSION: 1. CT FFR analysis showed occluded LAD at the proximal segment and severe stenosis in the proximal portion of a large ramus intermedius. Electronically Signed   By: Ena Dawley   On: 12/15/2018 00:24      Medications:     Scheduled Medications:  amiodarone  200 mg Oral BID   aspirin EC  81 mg Oral Daily   atorvastatin  80 mg Oral q1800   carvedilol  3.125 mg Oral BID   enoxaparin (LOVENOX) injection  40 mg Subcutaneous Q24H   guaiFENesin  1,200 mg Oral BID   losartan  25 mg Oral Daily   pantoprazole  40 mg Oral Daily   sodium chloride flush  3 mL Intravenous Q12H   spironolactone  12.5 mg Oral Daily     Infusions:  sodium chloride 250 mL (12/14/18 1722)     PRN Medications:  sodium chloride, acetaminophen, benzonatate, metoprolol tartrate, nitroGLYCERIN, ondansetron (ZOFRAN) IV, sodium chloride flush   Assessment/Plan:   1) Acute systolic HF - EF 62-37% by echo - EF 16% by Myoview with possible TID and mild ischemia - CTA on 8/18 suggestion of severe CAD with totally occluded LAD and high-grade ramus lesion. Also LLL nodule suggestive of lung CA - Still refusing cath due to stroke risk. I have asked his roommate to come in and discuss this with [place] and we had a discussion this am about need for cath and they are discussing. Hopefully can scheule for tomrrow - Suspect Frequent PVCs may also be contributing  to LV dysfunction  - NYHA III-IIIb  - Volume looks good now - Continue digoxin - Continue carvedilol, losartan and spiro. Titrate as needed. BP too low for Entresto  2) Frequent PVCs/NSVT - about 20/min ~25% burden - likely contributing to cardiomyopathy - start amio 200 bid to suppress - Keep K > 4.0 Mg > 2.0 - will need sleep study to investigate underlying cause  3) CAD - needs cath as above.  - continue ASA, statin  4) Lack of social support - Will need close f/u in HF Clinic with likely involvement of Paramedicine and SW teams      Length of Stay: 4   Glori Bickers MD 12/15/2018, 3:16 PM  Advanced Heart Failure Team Pager [phone] (M-F; [place])  Please contact [facility] for night-coverage  after hours (4p -7a ) and weekends on [email or web address]

## 2018-12-15 NOTE — Progress Notes (Signed)
PROGRESS NOTE  Jonathan King NWG:[id number] DOB: 06/11/1965 DOA: 12/09/2018 PCP: Patient, No Pcp Per  Brief History   The patient is a 53 year old Caucasian obese male with no real appreciable past medical history except history of intermittent alcohol abuse who presents with a chief complaint of grossly worsening shortness of breath, nonproductive cough as well as abdominal discomfort and swelling. He states that he has had trouble laying flat at night and tosses and turns but denies any lower extremity swelling. He states that symptoms started about a month ago insidiously and have progressively worsened. Also noted to have some mild abdominal discomfort and nausea over the same interval but was recently prescribed Protonix and Zofran and states that this is alleviated his abdominal discomfort.   He presented to the ED and was found to be afebrile and saturating 90% on room air while at rest but was slightly tachypneic. Chest x-ray was notable for increased cardiac silhouette from prior and BNP was elevated at 1200.8. Of note SARS-CoV-2 testing was still pending. CHF of unknown type given his persistently worsening shortness of breath with orthopnea and elevated BNP along with increased cardiac silhouette. He was started on diuresis and echocardiogram ordered.  Echocardiogram was done and was severely abnormal and showed an EF of 15 to 20% with severe global hypokinesis with moderately dilated left ventricle and grade 3 diastolic dysfunction with elevated left filling pressures, moderate right ventricular hypokinesis as well as mild LAE, moderate Mitral Regurg and trivial AI and dilated IVC.   Cardiology was consulted and recommending changing diuresis because of worsening renal function and planning on cathing the patient on Monday to rule out ischemia. Unfortunately the patient is very averse to getting a cardiac cath so he will undergo a stress Myoview test and this was done on 12/14/2018  along with a CTA chest. It demonstrated severe CAD with totally occluded LAD and high-grade ramus lesion. Also LLL nodule suggestive of lung CA.  Dr. Milas Kocher has discussed this with the patient and his roommate. He is hopeful that the patient will change his mind and go for [facility] tomorrow.    IV Diuresis is now stopped because of worsening renal function and because the patient appeared dry so was changed to po this AM. Patient continues to have frequent PVCs including ventricular bigeminy and cardiology considering antiarrhythmics given that the low-dose beta-blocker is ineffective and because the patient is bradycardic.  Cardiology has also added spironolactone 12.5 mg p.o. daily as well as losartan 25 mg p.o. daily S Dr. Jens Som does not think the patient can tolerate Entresto at this point and recommending considering it as an outpatient.  They will consider a cardiac MRI after discharge and referral to the CHF clinic in a recommending a follow-up echocardiogram in 3 months.  Consultants  . Cardiology . Pulmonary Critical Care  Procedures  . Myoview stress test.  Antibiotics   Anti-infectives (From admission, onward)   Start     Dose/Rate Route Frequency Ordered Stop   12/14/18 1700  Ampicillin-Sulbactam (UNASYN) 3 g in sodium chloride 0.9 % 100 mL IVPB  Status:  Discontinued     3 g 200 mL/hr over 30 Minutes Intravenous Every 8 hours 12/14/18 1654 12/15/18 1019    .  Marland Kitchen   Subjective  The patient is resting comfortably. No new complaints.  Objective   Vitals:  Vitals:   12/15/18 0430 12/15/18 0947  BP: 104/75 108/75  Pulse: 86 87  Resp: 19   Temp: 98.3 F (  36.8 C) 98.8 F (37.1 C)  SpO2: 100% 97%    Exam:  Constitutional:  . The patient is awake, alert, and oriented x 3. No acute distress. Respiratory:  . No increased work of breathing. . No wheezes, rales, or rhonchi. . No tactile fremitus. Cardiovascular:  . Regular rate and rhythm . No murmurs, ectopy, or  gallups. . No lateral PMI. No thrills. Abdomen:  . Abdomen is soft, non-tender, non-distended. . No hernias, masses, or organomegaly . Normoactive bowel sounds.  Musculoskeletal:  . No cyanosis, clubbing, or edema Skin:  . No rashes, lesions, ulcers . palpation of skin: no induration or nodules Neurologic:  . CN 2-12 intact . Sensation all 4 extremities intact Psychiatric:  . Mental status o Mood, affect appropriate o Orientation to person, place, time  . judgment and insight appear intact     I have personally reviewed the following:   Today's Data  . Vitals, CMP, CBC  Imaging  . CTA chest  Cardiology Data  . Myoview stress test.   Scheduled Meds: . amiodarone  200 mg Oral BID  . aspirin EC  81 mg Oral Daily  . atorvastatin  80 mg Oral q1800  . carvedilol  3.125 mg Oral BID  . enoxaparin (LOVENOX) injection  40 mg Subcutaneous Q24H  . guaiFENesin  1,200 mg Oral BID  . losartan  25 mg Oral Daily  . pantoprazole  40 mg Oral Daily  . sodium chloride flush  3 mL Intravenous Q12H  . spironolactone  12.5 mg Oral Daily   Continuous Infusions: . 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) Active Problems:   Acute systolic (congestive) heart failure (HCC)   NSVT (nonsustained ventricular tachycardia) (HCC)   Pre-diabetes   GERD (gastroesophageal reflux disease)   Cough   Hypokalemia   Hyponatremia   Hypochloremia   Elevated ALT measurement   Leukocytosis   Elevated TSH   SOB (shortness of breath)   Aspiration pneumonia of right lower lobe (HCC)   Lung mass   LOS: 4 days   A & P    Acute Combined Systolic and Diastolic CHFExacerbation, improved Cardiomyopathy, Unclear Etiology: The patient presented with insidiously worsening SOB with orthopnea, no swelling, no fever, and no chest pain.Likely due to ischemic heart disease. Cardiology and advanced heart failure team  have been consulted. The patient had been receiving IV lasix bid, but that has now been stopped due to worsening renal function. ACE has also been discontinued. It has been recommended that the patient be started on Entresto as outpatient after discharge if his blood pressure allows. Echocardiogram performed on 12/11/2018 showed LVEF 15-20%, severe global hypokinesis with moderately dilated LV, grade 3 DD with elevated LV filling pressure, moderate RV hypokinesis, mild LAE, moderate MR, trivial AI, dilated IVC. Monitor volume status with strict I's and O's and daily weights and fluid restrict to 1500 mL's. Patient is -7,504.7 cc. He has been started on spironolactone12.5 mg PO daily and losartan 25 mg daily.   NSVT and frequent PVC's: Likely due to ischemia. Patient has been started on coreg bid. Continue to monitor on telemetry. Possibly for [facility] tomorrow if patient will agree. Continue tomonitor renal function and electrolytes during diuresis. Continue to Hold on ACE currently and start Entresto Likely when renal function improves; BB to be started and the patient will be started on Carvedilol 3.125 mg twice daily given his frequent PVCs  and nonsustained V. Tach. Keep K>4.0 and Mg > 2.0. Sleep study is strongly recommended as outpatient. Cardiology will quantify on telemetry and if it is greater than 10% they will need amiodarone to suppress. They are going to add digoxin for his heart failure but patient was given Ivbradine earlier.  CAD: Patient was ruled out for MI by EKG and enzyme criteria. He underwent Myoview stress test and CTA chest which suggested severe CAD with totally occluded LAD and high-grade ramus lesion. Also LLL nodule suggestive of lung CA. While nonspecific, this can be seen in the setting of diffuse endocardial reversible ischemia as can be seen with multivessel coronary artery disease. Additionally, there is a small area of relatively more severe reversible ischemia along the apically  anterior and anteroseptal walls along the distribution of the left anterior descending coronary artery. Marked left ventricular dilation with severe global hypokinesis. Left ventricular ejection fraction 16%."  Dr. Milas Kocher spoke to the patient and his roommate today trying to persuade the patient to go forward with [facility] tomorrow. He has refussed so far due to concern for CVA as a result of study.  The patient has been empirically started on high dose statin and ASA 81 mg daily.   Hyperglycemia in the setting of Pre-Diabetes: HbA1c was 5.9. FSBS for the last 24 hours have been 108 - 134. Monitor.   Pericardial Effusion, ruled out: Ruled out by echocardiogram after CXR raised the possibility of pericardial effusion.  GERD: The patient is receiving Pantoprazole 40 mg po Daily.  Cough: Improving. Continue benzonatate and guaifenesin.  AKI/Renal Insufficiency: Improved now with discontinuation of lasix. Avoid Nephrotoxic Medications, Contrast Dyes, as well as Hypotension if possible. Monitor creatinine, electrolytes, and volume status.   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 : Pulmonology consulted.  Infiltrate seen on CTA in the the right lobe: Patient was started on IV Unasyn. He has been given a flutter valve and incentive spirometry. CRP is <0.8 and Procalcitonin is 0.11. Antibiotics have been stopped. Monitor WBC.   Hyponatremia/hypochloremia: Resolved. Monitor.  Hypokalemia: Supplement and monitor. 3.4 this am. Keep K>4 as per cardiology.   Elevated LFT: Improved. Likely reactive and will continue to monitor and trend.  Leukocytosis: Improved. 10.8 this morning. Monitor. No signs of infection.   Elevated TSH; suspected euthyroid sick syndrome. TSH , T4 and FT3 should be rechecked after patient is completely recovered in 4-6 weeks.  Mitral Regurgitation: Moderate per echocardiogram. Cardiology is recommending reassessment with an echocardiogram once cardiac  medications are fully titrated in 3 months.  I have seen and examined this patient myself. I have spent 38 minutes in his evaluation and care.  DVT prophylaxis: Enoxaparin 40 mg sq q24h Code Status: FULL CODE Family Communication: No family present at bedside  Disposition Plan: Anticipating discharge home tomorrow pending cardiac clearance  Tipton Ballow, DO [facility] Direct contact: see [email or web address]  7PM-7AM contact night coverage as above 12/15/2018, 3:47 PM  LOS: 4 days

## 2018-12-15 NOTE — Evaluation (Signed)
Clinical/Bedside Swallow Evaluation Patient Details  Name: Jonathan King MRN: [id number] Date of Birth: 1965/11/12  Today's Date: 12/15/2018 Time: SLP Start Time (ACUTE ONLY): 1127 SLP Stop Time (ACUTE ONLY): 1145 SLP Time Calculation (min) (ACUTE ONLY): 18 min  Past Medical History:  Past Medical History:  Diagnosis Date  . Back pain    Past Surgical History:  Past Surgical History:  Procedure Laterality Date  . CHOLECYSTECTOMY  01/31/2012   Procedure: LAPAROSCOPIC CHOLECYSTECTOMY;  Surgeon: Atilano Ina, MD,FACS;  Location: [facility] OR;  Service: General;  Laterality: N/A;   HPI:  Pt is a 53 y.o. male who presented with dyspnea felt to be secondary to volume overload. 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 Refused LHC and underwent cardiac CT, which described probable aspiration RLL and mass-like lesion on the left.No significant PMH noted.   Assessment / Plan / Recommendation Clinical Impression  Pt uses a liquid wash and lingual sweep with Mod I to ensure adequate oral clearance and appears to trigger a swallow swiftly and consistently to palpation. He did not complete a three ounce water screen, suspect primarily behavioral and/or cognitive in nature, stating that "no one can do that!" and taking only small, single sips despite cueing from SLP and roommate. Suspect that pt's risk for aspiration may be highest post-prandially given eructation noted throughout trials and delayed coughing noted after eating solids. His roommate also reports coughing that is associated with meal times, but only noted after the meal is over. Given the above as well as findings on CT concerning for aspiration, would recommend additional SLP f/u acutely. Will continue with current diet for now, utilizing precautions. Would also recommend that MD consider a more dedicated esophageal assessment. SLP Visit Diagnosis: Dysphagia, unspecified (R13.10)    Aspiration Risk  Mild aspiration risk    Diet Recommendation  Regular;Thin liquid   Liquid Administration via: Cup;Straw Medication Administration: Whole meds with liquid Supervision: Patient able to self feed;Intermittent supervision to cue for compensatory strategies Compensations: Slow rate;Small sips/bites Postural Changes: Remain upright for at least 30 minutes after po intake;Seated upright at 90 degrees    Other  Recommendations Oral Care Recommendations: Oral care BID   Follow up Recommendations None      Frequency and Duration min 2x/week  1 week       Prognosis        Swallow Study   General HPI: Pt is a 53 y.o. male who presented with dyspnea felt to be secondary to volume overload. 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 Refused LHC and underwent cardiac CT, which described probable aspiration RLL and mass-like lesion on the left.No significant PMH noted. Type of Study: Bedside Swallow Evaluation Previous Swallow Assessment: none in chart Diet Prior to this Study: Regular;Thin liquids Temperature Spikes Noted: No Respiratory Status: Room air History of Recent Intubation: No Behavior/Cognition: Alert;Cooperative Oral Cavity Assessment: Within Functional Limits Oral Care Completed by SLP: No Oral Cavity - Dentition: Adequate natural dentition;Poor condition Vision: Functional for self-feeding Self-Feeding Abilities: Able to feed self Patient Positioning: Upright in bed Baseline Vocal Quality: Normal Volitional Swallow: Able to elicit    Oral/Motor/Sensory Function Overall Oral Motor/Sensory Function: Within functional limits   Ice Chips Ice chips: Not tested   Thin Liquid Thin Liquid: Within functional limits Presentation: Self Fed;Straw    Nectar Thick Nectar Thick Liquid: Not tested   Honey Thick Honey Thick Liquid: Not tested   Puree Puree: Within functional limits Presentation: Self Fed;Spoon   Solid  Solid: Impaired Presentation: Self Fed Pharyngeal Phase Impairments: Cough - Delayed      Virl Axe  Jimmy Stipes 12/15/2018,12:06 PM  Ivar Drape, M.A. CCC-SLP Acute Herbalist [phone] Office [phone]

## 2018-12-16 ENCOUNTER — Encounter (HOSPITAL_COMMUNITY): Payer: Self-pay | Admitting: Internal Medicine

## 2018-12-16 ENCOUNTER — Inpatient Hospital Stay (HOSPITAL_COMMUNITY): Payer: Self-pay

## 2018-12-16 ENCOUNTER — Encounter (HOSPITAL_COMMUNITY)
Admission: EM | Disposition: A | Discharge status: Home / Self Care | Payer: Self-pay | Source: Home / Self Care | Attending: Cardiothoracic Surgery

## 2018-12-16 ENCOUNTER — Other Ambulatory Visit: Payer: Self-pay | Admitting: *Deleted

## 2018-12-16 DIAGNOSIS — R740 Nonspecific elevation of levels of transaminase and lactic acid dehydrogenase [LDH]: Secondary | ICD-10-CM

## 2018-12-16 DIAGNOSIS — I2510 Atherosclerotic heart disease of native coronary artery without angina pectoris: Secondary | ICD-10-CM

## 2018-12-16 DIAGNOSIS — E871 Hypo-osmolality and hyponatremia: Secondary | ICD-10-CM

## 2018-12-16 DIAGNOSIS — R7989 Other specified abnormal findings of blood chemistry: Secondary | ICD-10-CM

## 2018-12-16 DIAGNOSIS — E878 Other disorders of electrolyte and fluid balance, not elsewhere classified: Secondary | ICD-10-CM

## 2018-12-16 DIAGNOSIS — Z01810 Encounter for preprocedural cardiovascular examination: Secondary | ICD-10-CM

## 2018-12-16 DIAGNOSIS — E876 Hypokalemia: Secondary | ICD-10-CM

## 2018-12-16 DIAGNOSIS — K219 Gastro-esophageal reflux disease without esophagitis: Secondary | ICD-10-CM

## 2018-12-16 DIAGNOSIS — D72829 Elevated white blood cell count, unspecified: Secondary | ICD-10-CM

## 2018-12-16 HISTORY — PX: RIGHT/LEFT HEART CATH AND CORONARY ANGIOGRAPHY: CATH118266

## 2018-12-16 LAB — POCT I-STAT EG7
Acid-base deficit: 1 mmol/L (ref 0.0–2.0)
Acid-base deficit: 4 mmol/L — ABNORMAL HIGH (ref 0.0–2.0)
Bicarbonate: 20.8 mmol/L (ref 20.0–28.0)
Bicarbonate: 24.2 mmol/L (ref 20.0–28.0)
Calcium, Ion: 0.83 mmol/L — CL (ref 1.15–1.40)
Calcium, Ion: 1.12 mmol/L — ABNORMAL LOW (ref 1.15–1.40)
HCT: 37 % — ABNORMAL LOW (ref 39.0–52.0)
HCT: 43 % (ref 39.0–52.0)
Hemoglobin: 12.6 g/dL — ABNORMAL LOW (ref 13.0–17.0)
Hemoglobin: 14.6 g/dL (ref 13.0–17.0)
O2 Saturation: 65 %
O2 Saturation: 68 %
Potassium: 2.6 mmol/L — CL (ref 3.5–5.1)
Potassium: 3.4 mmol/L — ABNORMAL LOW (ref 3.5–5.1)
Sodium: 137 mmol/L (ref 135–145)
Sodium: 141 mmol/L (ref 135–145)
TCO2: 22 mmol/L (ref 22–32)
TCO2: 26 mmol/L (ref 22–32)
pCO2, Ven: 37.1 mmHg — ABNORMAL LOW (ref 44.0–60.0)
pCO2, Ven: 42.6 mmHg — ABNORMAL LOW (ref 44.0–60.0)
pH, Ven: 7.356 (ref 7.250–7.430)
pH, Ven: 7.363 (ref 7.250–7.430)
pO2, Ven: 35 mmHg (ref 32.0–45.0)
pO2, Ven: 37 mmHg (ref 32.0–45.0)

## 2018-12-16 LAB — CBC
HCT: 43.6 % (ref 39.0–52.0)
Hemoglobin: 14.1 g/dL (ref 13.0–17.0)
MCH: 29.7 pg (ref 26.0–34.0)
MCHC: 32.3 g/dL (ref 30.0–36.0)
MCV: 91.8 fL (ref 80.0–100.0)
Platelets: 232 10*3/uL (ref 150–400)
RBC: 4.75 MIL/uL (ref 4.22–5.81)
RDW: 13.1 % (ref 11.5–15.5)
WBC: 8.2 10*3/uL (ref 4.0–10.5)
nRBC: 0 % (ref 0.0–0.2)

## 2018-12-16 LAB — BASIC METABOLIC PANEL
Anion gap: 10 (ref 5–15)
Anion gap: 9 (ref 5–15)
BUN: 20 mg/dL (ref 6–20)
BUN: 22 mg/dL — ABNORMAL HIGH (ref 6–20)
CO2: 23 mmol/L (ref 22–32)
CO2: 22 mmol/L (ref 22–32)
Calcium: 8.7 mg/dL — ABNORMAL LOW (ref 8.9–10.3)
Calcium: 8.7 mg/dL — ABNORMAL LOW (ref 8.9–10.3)
Chloride: 100 mmol/L (ref 98–111)
Chloride: 101 mmol/L (ref 98–111)
Creatinine, Ser: 1.09 mg/dL (ref 0.61–1.24)
Creatinine, Ser: 1.09 mg/dL (ref 0.61–1.24)
GFR calc Af Amer: >60 mL/min (ref >60)
GFR calc Af Amer: >60 mL/min (ref >60)
GFR calc non Af Amer: >60 mL/min (ref >60)
GFR calc non Af Amer: >60 mL/min (ref >60)
Glucose, Bld: 99 mg/dL (ref 70–99)
Glucose, Bld: 108 mg/dL — ABNORMAL HIGH (ref 70–99)
Potassium: 3.7 mmol/L (ref 3.5–5.1)
Potassium: 4.2 mmol/L (ref 3.5–5.1)
Sodium: 133 mmol/L — ABNORMAL LOW (ref 135–145)
Sodium: 132 mmol/L — ABNORMAL LOW (ref 135–145)

## 2018-12-16 LAB — POCT I-STAT 7, (LYTES, BLD GAS, ICA,H+H)
Acid-base deficit: 1 mmol/L (ref 0.0–2.0)
Bicarbonate: 23.8 mmol/L (ref 20.0–28.0)
Calcium, Ion: 1.14 mmol/L — ABNORMAL LOW (ref 1.15–1.40)
HCT: 43 % (ref 39.0–52.0)
Hemoglobin: 14.6 g/dL (ref 13.0–17.0)
O2 Saturation: 98 %
Potassium: 3.5 mmol/L (ref 3.5–5.1)
Sodium: 138 mmol/L (ref 135–145)
TCO2: 25 mmol/L (ref 22–32)
pCO2 arterial: 39.9 mmHg (ref 32.0–48.0)
pH, Arterial: 7.382 (ref 7.350–7.450)
pO2, Arterial: 104 mmHg (ref 83.0–108.0)

## 2018-12-16 LAB — ALBUMIN: Albumin: 3.4 g/dL — ABNORMAL LOW (ref 3.5–5.0)

## 2018-12-16 LAB — URINALYSIS, ROUTINE W REFLEX MICROSCOPIC
Bilirubin Urine: NEGATIVE
Glucose, UA: NEGATIVE mg/dL
Hgb urine dipstick: NEGATIVE
Ketones, ur: NEGATIVE mg/dL
Leukocytes,Ua: NEGATIVE
Nitrite: NEGATIVE
Protein, ur: NEGATIVE mg/dL
Specific Gravity, Urine: 1.017 (ref 1.005–1.030)
pH: 5 (ref 5.0–8.0)

## 2018-12-16 LAB — PROCALCITONIN: Procalcitonin: <0.1 ng/mL

## 2018-12-16 SURGERY — RIGHT/LEFT HEART CATH AND CORONARY ANGIOGRAPHY
Anesthesia: LOCAL

## 2018-12-16 MED ORDER — SODIUM CHLORIDE 0.9% FLUSH
3.0000 mL | Freq: Two times a day (BID) | INTRAVENOUS | Status: DC
  Administered 2018-12-17 – 2018-12-20 (×6): 3 mL via INTRAVENOUS

## 2018-12-16 MED ORDER — LIDOCAINE HCL (PF) 1 % IJ SOLN
INTRAMUSCULAR | Status: DC | PRN
  Administered 2018-12-16 (×3): 5 mL

## 2018-12-16 MED ORDER — HEPARIN (PORCINE) IN NACL 1000-0.9 UT/500ML-% IV SOLN
INTRAVENOUS | Status: AC
  Filled 2018-12-16: qty 1000

## 2018-12-16 MED ORDER — HEPARIN SODIUM (PORCINE) 1000 UNIT/ML IJ SOLN
INTRAMUSCULAR | Status: AC
  Filled 2018-12-16: qty 1

## 2018-12-16 MED ORDER — VERAPAMIL HCL 2.5 MG/ML IV SOLN
INTRAVENOUS | Status: AC
  Filled 2018-12-16: qty 2

## 2018-12-16 MED ORDER — POTASSIUM CHLORIDE 10 MEQ/100ML IV SOLN
10.0000 meq | INTRAVENOUS | Status: AC
  Administered 2018-12-16 – 2018-12-17 (×3): 10 meq via INTRAVENOUS
  Filled 2018-12-16 (×2): qty 100

## 2018-12-16 MED ORDER — POTASSIUM CHLORIDE CRYS ER 20 MEQ PO TBCR
40.0000 meq | EXTENDED_RELEASE_TABLET | Freq: Once | ORAL | Status: AC
  Administered 2018-12-16: 40 meq via ORAL
  Filled 2018-12-16: qty 2

## 2018-12-16 MED ORDER — HYDRALAZINE HCL 20 MG/ML IJ SOLN: 10.0000 mg | INTRAMUSCULAR | Status: AC | PRN

## 2018-12-16 MED ORDER — SODIUM CHLORIDE 0.9 % IV SOLN: 250.0000 mL | INTRAVENOUS | Status: DC | PRN

## 2018-12-16 MED ORDER — DOXYCYCLINE HYCLATE 100 MG PO TABS
100.0000 mg | ORAL_TABLET | Freq: Two times a day (BID) | ORAL | Status: DC
  Administered 2018-12-16 – 2018-12-20 (×10): 100 mg via ORAL
  Filled 2018-12-16 (×10): qty 1

## 2018-12-16 MED ORDER — ACETAMINOPHEN 325 MG PO TABS: 650.0000 mg | ORAL_TABLET | ORAL | Status: DC | PRN

## 2018-12-16 MED ORDER — FENTANYL CITRATE (PF) 100 MCG/2ML IJ SOLN
INTRAMUSCULAR | Status: AC
  Filled 2018-12-16: qty 2

## 2018-12-16 MED ORDER — POTASSIUM CHLORIDE 10 MEQ/100ML IV SOLN
INTRAVENOUS | Status: AC
  Administered 2018-12-16: 10 meq
  Filled 2018-12-16: qty 100

## 2018-12-16 MED ORDER — MIDAZOLAM HCL 2 MG/2ML IJ SOLN
INTRAMUSCULAR | Status: DC | PRN
  Administered 2018-12-16: 2 mg via INTRAVENOUS

## 2018-12-16 MED ORDER — LABETALOL HCL 5 MG/ML IV SOLN: 10.0000 mg | INTRAVENOUS | Status: AC | PRN

## 2018-12-16 MED ORDER — LIDOCAINE HCL (PF) 1 % IJ SOLN
INTRAMUSCULAR | Status: AC
  Filled 2018-12-16: qty 30

## 2018-12-16 MED ORDER — SODIUM CHLORIDE 0.9% FLUSH: 3.0000 mL | INTRAVENOUS | Status: DC | PRN

## 2018-12-16 MED ORDER — IOHEXOL 350 MG/ML SOLN
INTRAVENOUS | Status: DC | PRN
  Administered 2018-12-16: 55 mL via INTRAVENOUS

## 2018-12-16 MED ORDER — ONDANSETRON HCL 4 MG/2ML IJ SOLN: 4.0000 mg | Freq: Four times a day (QID) | INTRAMUSCULAR | Status: DC | PRN

## 2018-12-16 MED ORDER — VERAPAMIL HCL 2.5 MG/ML IV SOLN
INTRAVENOUS | Status: DC | PRN
  Administered 2018-12-16: 10 mL via INTRA_ARTERIAL

## 2018-12-16 MED ORDER — ENOXAPARIN SODIUM 40 MG/0.4ML ~~LOC~~ SOLN
40.0000 mg | SUBCUTANEOUS | Status: DC
  Administered 2018-12-17 – 2018-12-20 (×4): 40 mg via SUBCUTANEOUS
  Filled 2018-12-16 (×4): qty 0.4

## 2018-12-16 MED ORDER — FENTANYL CITRATE (PF) 100 MCG/2ML IJ SOLN
INTRAMUSCULAR | Status: DC | PRN
  Administered 2018-12-16: 25 ug via INTRAVENOUS

## 2018-12-16 MED ORDER — SODIUM CHLORIDE 0.9 % IV SOLN
INTRAVENOUS | Status: AC
  Administered 2018-12-16: 11:00:00 via INTRAVENOUS

## 2018-12-16 MED ORDER — MIDAZOLAM HCL 2 MG/2ML IJ SOLN
INTRAMUSCULAR | Status: AC
  Filled 2018-12-16: qty 2

## 2018-12-16 MED ORDER — HEPARIN SODIUM (PORCINE) 1000 UNIT/ML IJ SOLN
INTRAMUSCULAR | Status: DC | PRN
  Administered 2018-12-16: 4500 [IU] via INTRAVENOUS

## 2018-12-16 MED ORDER — HEPARIN (PORCINE) IN NACL 1000-0.9 UT/500ML-% IV SOLN
INTRAVENOUS | Status: DC | PRN
  Administered 2018-12-16 (×2): 500 mL

## 2018-12-16 SURGICAL SUPPLY — 12 items
CATH 5FR JL3.5 JR4 ANG PIG MP (CATHETERS) ×1 IMPLANT
CATH BALLN WEDGE 5F 110CM (CATHETERS) ×1 IMPLANT
DEVICE RAD COMP TR BAND LRG (VASCULAR PRODUCTS) ×1 IMPLANT
GLIDESHEATH SLEND SS 6F .021 (SHEATH) ×1 IMPLANT
GUIDEWIRE INQWIRE 1.5J.035X260 (WIRE) IMPLANT
INQWIRE 1.5J .035X260CM (WIRE) ×2
KIT MICROPUNCTURE NIT STIFF (SHEATH) ×1 IMPLANT
PACK CARDIAC CATHETERIZATION (CUSTOM PROCEDURE TRAY) ×2 IMPLANT
SHEATH GLIDE SLENDER 4/5FR (SHEATH) ×1 IMPLANT
SHEATH PROBE COVER 6X72 (BAG) ×1 IMPLANT
TRANSDUCER W/STOPCOCK (MISCELLANEOUS) ×2 IMPLANT
WIRE MICROINTRODUCER 60CM (WIRE) ×1 IMPLANT

## 2018-12-16 NOTE — H&P (View-Only) (Signed)
Advanced Heart Failure Rounding Note   Subjective:    Chest CT 8/17  with suggestion of severe CAD with totally occluded LAD and high-grade ramus lesion. Also LLL nodule suggestive of lung CA (versus infection)  Started on amio yesterday for PVCs. Anxious but ready for cath. Ambulating room. No CP. SOB improved. No orthopnea or PND.    Objective:   Weight Range:  Vital Signs:   Temp:  [98.3 F (36.8 C)-98.8 F (37.1 C)] 98.3 F (36.8 C) (08/20 0423) Pulse Rate:  [37-92] 73 (08/20 0423) Resp:  [18] 18 (08/20 0423) BP: (96-139)/(62-97) 96/64 (08/20 0423) SpO2:  [93 %-97 %] 93 % (08/20 0423) Weight:  [87.8 kg] 87.8 kg (08/20 0423) Last BM Date: 12/14/18  Weight change: Filed Weights   12/14/18 0457 12/15/18 0430 12/16/18 0423  Weight: 87.2 kg 86.3 kg 87.8 kg    Intake/Output:   Intake/Output Summary (Last 24 hours) at 12/16/2018 0813 Last data filed at 12/16/2018 0758 Gross per 24 hour  Intake 1704.1 ml  Output 1475 ml  Net 229.1 ml     Physical Exam: General:  Well appearing. No resp difficulty HEENT: normal Neck: supple. no JVD. Carotids 2+ bilat; no bruits. No lymphadenopathy or thryomegaly appreciated. Cor: PMI nondisplaced. Regular rate & rhythm. No rubs, gallops or murmurs. Lungs: clear Abdomen: soft, nontender, nondistended. No hepatosplenomegaly. No bruits or masses. Good bowel sounds. Extremities: no cyanosis, clubbing, rash, edema Neuro: alert & orientedx3, cranial nerves grossly intact. moves all 4 extremities w/o difficulty. Affect pleasant   Telemetry: NSR 70-80s with frequent polymorphic PVCs = Personally reviewed   Labs: Basic Metabolic Panel: Recent Labs  Lab 12/11/18 0511 12/12/18 0519 12/13/18 1237 12/14/18 0512 12/15/18 0513 12/16/18 0639  NA 137 133* 136 135 135 133*  K 4.3 3.4* 3.8 4.2 3.4* 3.7  CL 98 93* 101 99 98 100  CO2 29 27 25 26 23 23   GLUCOSE 105* 117* 134* 108* 110* 99  BUN 18 21* 19 19 24* 20  CREATININE 1.32*  1.37* 1.26* 1.18 1.29* 1.09  CALCIUM 9.5 9.1 9.0 9.0 9.0 8.7*  MG 2.1 2.0 2.1 2.2 2.1  --   PHOS 4.2 4.6 3.3 3.8 4.6  --     Liver Function Tests: Recent Labs  Lab 12/10/18 1154 12/11/18 0511 12/12/18 0519 12/13/18 1237 12/15/18 0513  AST 21 27 34 25 19  ALT 29 34 45* 34 27  ALKPHOS 76 73 79 78 75  BILITOT 0.7 0.8 1.2 1.1 1.1  PROT 7.2 7.9 7.6 7.1 7.3  ALBUMIN 3.7 3.7 3.7 3.6 3.5   Recent Labs  Lab 12/09/18 1947  LIPASE 31   No results for input(s): AMMONIA in the last 168 hours.  CBC: Recent Labs  Lab 12/11/18 0511 12/12/18 0519 12/13/18 1237 12/14/18 0512 12/15/18 0513  WBC 10.5 11.4* 9.6 10.3 10.8*  NEUTROABS 6.0 6.9 6.1 6.2 6.9  HGB 16.4 16.0 15.8 16.0 15.5  HCT 50.5 49.0 48.9 49.4 47.3  MCV 91.8 90.6 91.4 92.2 89.8  PLT 228 238 243 247 231    Cardiac Enzymes: No results for input(s): CKTOTAL, CKMB, CKMBINDEX, TROPONINI in the last 168 hours.  BNP: BNP (last 3 results) Recent Labs    12/10/18 0248  BNP 1,200.8*    ProBNP (last 3 results) No results for input(s): PROBNP in the last 8760 hours.    Other results:  Imaging: Ct Coronary Morph W/cta Cor W/score W/ca W/cm &/or Wo/cm  Addendum Date: 12/14/2018   ADDENDUM  REPORT: 12/14/2018 16:25 CLINICAL DATA:  53 year old male with new diagnosis of acute systolic CHF and an abnormal stress test. EXAM: Cardiac/Coronary  CTA TECHNIQUE: The patient was scanned on a [employer]. FINDINGS: A 100 kV prospective scan was triggered in the descending thoracic aorta at 111 HU's. Axial non-contrast 3 mm slices were carried out through the heart. The data set was analyzed on a dedicated work station and scored using the [place]. Gantry rotation speed was 250 msecs and collimation was .6 mm. 15 mg of PO Ivabradine, 10 mg of iv Metoprolol and 0.4 Mg of sl NTG was given. The 3D data set was reconstructed in 5% intervals of the 67-82 % of the R-R cycle. Diastolic phases were analyzed on a dedicated work  station using MPR, MIP and VRT modes. The patient received 80 cc of contrast. Aorta: Normal size. Mild diffuse atherosclerotic disease and calcifications. No dissection. Aortic Valve:  Trileaflet.  No calcifications. Coronary Arteries:  Normal coronary origin.  Right dominance. RCA is a large dominant artery that gives rise to PDA and PLA. RCA is affected by motion by there is only minimal diffuse plaque, PDA and PLA are small lumen arteries that are not well visualized. Left main is a large artery that gives rise to LAD, a large ramus intermedius and LCX arteries. Left main has minimal plaque. LAD is a large vessel that gives rise to one diagonal artery. Proximal LAD has severe, predominantly non-calcified plaque with subtotal stenosis > 99%. This is followed by another severe predominantly calcified plaque with severe stenosis. Mid and distal LAD have very small lumen most probably sec to proximal stenosis. D1 is a small lumen artery with mild calcified ostial plaque with stenosis 25-49%. Ramus intermedius is a large artery that has moderate diffuse mixed plaque. Interpretation is affected by motion but there is possibly severe stenosis in the mid portion. LCX is a small non-dominant artery that has mild diffuse calcified plaque. Other findings: Normal pulmonary vein drainage into the left atrium. Normal left atrial appendage without a thrombus. Normal size of the pulmonary artery. IMPRESSION: 1. Coronary calcium score of 120. This was 52 percentile for age and sex matched control. 2. Study quality is affected significantly by motion secondary to frequent ventricular ectopy. 3. Normal coronary origin with right dominance. 4. Proximal LAD has severe, predominantly non-calcified plaque with subtotal stenosis > 99%. This is followed by another severe predominantly calcified plaque with severe stenosis. Interpretation of ramus intermedius is affected by motion but there is possibly severe stenosis in the mid portion.  CAD-RADS 4 Severe stenosis. (70-99%). Cardiac catheterization is recommended. Electronically Signed   By: Ena Dawley   On: 12/14/2018 16:25   Result Date: 12/14/2018 EXAM: OVER-READ INTERPRETATION  CT CHEST The following report is an over-read performed by radiologist Dr. Abigail Miyamoto of [facility], [place] on 12/14/2018. This over-read does not include interpretation of cardiac or coronary anatomy or pathology. The coronary CTA interpretation by the cardiologist is attached. COMPARISON:  Chest radiograph 12/13/2018.  No prior CT. FINDINGS: Vascular: Normal aortic caliber with mild aortic atherosclerosis. No central pulmonary embolism, on this non-dedicated study. Mediastinum/Nodes: Prominent but not pathologically enlarged lower mediastinal nodes, including a node of 8 mm within the azygoesophageal recess. Borderline right hilar adenopathy at 1.3 cm on 07/11. Lungs/Pleura: No imaged pleural fluid. Dependent right lower lobe ground-glass and airspace disease. Left lower lobe more nodular opacity with mild surrounding ground-glass. Example 2.5 x 2.0 cm on image 14/5. Upper Abdomen:  Normal imaged portions of the liver, spleen, stomach. Musculoskeletal: No acute osseous abnormality. IMPRESSION: 1. Right lower lobe airspace disease, suspicious for infection or aspiration. 2. Left lower lobe pulmonary nodule. This is suspicious for primary bronchogenic carcinoma. Given the right lower lobe airspace disease, infectious etiology is possible but felt less likely. Consider antibiotic therapy and CT follow-up at 2-3 weeks. 3. Borderline right hilar adenopathy, most likely reactive. 4.  Aortic Atherosclerosis (ICD10-I70.0). These results will be called to the ordering clinician or representative by the Radiologist Assistant, and communication documented in the PACS or zVision Dashboard. Electronically Signed: By: Abigail Miyamoto M.D. On: 12/14/2018 15:56   Ct Coronary Fractional Flow Reserve Fluid Analysis  Result  Date: 12/15/2018 EXAM: CT FFR ANALYSIS CLINICAL DATA:  53 year old male with new diagnosis of cardiomyopathy and abnormal coronary CTA. FINDINGS: FFRct analysis was performed on the original cardiac CT angiogram dataset. Diagrammatic representation of the FFRct analysis is provided in a separate PDF document in PACS. This dictation was created using the PDF document and an interactive 3D model of the results. 3D model is not available in the EMR/PACS. Normal FFR range is >0.80. 1. Left Main: 0.99. 2. LAD: Ostial: 0.99.  Proximal: Occluded. 3. Ramus intermedius: Ostial; 0.99.  Proximal: < 0.5 4. LCX: 0.95. 5. RCA: Proximal: 0.99, distal: 0.94 IMPRESSION: 1. CT FFR analysis showed occluded LAD at the proximal segment and severe stenosis in the proximal portion of a large ramus intermedius. Electronically Signed   By: Ena Dawley   On: 12/15/2018 00:24     Medications:     Scheduled Medications:  [MAR Hold] amiodarone  200 mg Oral BID   [MAR Hold] aspirin EC  81 mg Oral Daily   [MAR Hold] atorvastatin  80 mg Oral q1800   [MAR Hold] carvedilol  3.125 mg Oral BID   [MAR Hold] enoxaparin (LOVENOX) injection  40 mg Subcutaneous Q24H   [MAR Hold] guaiFENesin  1,200 mg Oral BID   [MAR Hold] losartan  25 mg Oral Daily   [MAR Hold] pantoprazole  40 mg Oral Daily   [MAR Hold] sodium chloride flush  3 mL Intravenous Q12H   [MAR Hold] sodium chloride flush  3 mL Intravenous Q12H   [MAR Hold] spironolactone  12.5 mg Oral Daily    Infusions:  [MAR Hold] sodium chloride 250 mL (12/14/18 1722)   sodium chloride     sodium chloride 10 mL/hr at 12/16/18 0534    PRN Medications: [MAR Hold] sodium chloride, sodium chloride, [MAR Hold] acetaminophen, [MAR Hold] benzonatate, [MAR Hold] metoprolol tartrate, [MAR Hold] nitroGLYCERIN, [MAR Hold] ondansetron (ZOFRAN) IV, [MAR Hold] sodium chloride flush, sodium chloride flush   Assessment/Plan:   1) Acute systolic HF - EF 37-34% by echo -  EF 16% by Myoview with possible TID and mild ischemia - CTA on 8/18 suggestion of severe CAD with totally occluded LAD and high-grade ramus lesion. Also LLL nodule suggestive of lung CA (vs infection) - Agrees to cath today - Suspect Frequent PVCs may also be contributing to LV dysfunction  - NYHA III-IIIb  - Volume looks good now. Trending back up today. Will assess filling pressures on cath today.  - Continue digoxin - Continue carvedilol, losartan and spiro. Titrate as needed. 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 May be infectious. Suggest re-imaging in 3 weeks but if needs coronary stent will be on plavix then and unable to biopsy. - May need to biopsy sooner - Unasyn stopped yesterday - Likely reasonable to finish with course of doxy  5) Lack of social support - Will need close f/u in HF Clinic with likely involvement of Paramedicine and SW teams      Length of Stay: 5   Glori Bickers MD 12/16/2018, 8:13 AM  Advanced Heart Failure Team Pager [phone] (M-F; [place])  Please contact [facility] for night-coverage after hours (4p -7a ) and weekends on [email or web address]

## 2018-12-16 NOTE — Progress Notes (Signed)
Pre CABG Dopplers completed. Preliminary results in Chart review CV Proc. [place] Jonathan King,RVS 12/16/2018 7:18 PM

## 2018-12-16 NOTE — Interval H&P Note (Signed)
History and Physical Interval Note:  12/16/2018 8:45 AM  Jonathan King  has presented today for surgery, with the diagnosis of heart failure.  The various methods of treatment have been discussed with the patient and family. After consideration of risks, benefits and other options for treatment, the patient has consented to  Procedure(s): RIGHT/LEFT HEART CATH AND CORONARY ANGIOGRAPHY (N/A) and possible percutaneous coronary angioplasty as a surgical intervention.  The patient's history has been reviewed, patient examined, no change in status, stable for surgery.  I have reviewed the patient's chart and labs.  Questions were answered to the patient's satisfaction.     Anesa Fronek

## 2018-12-16 NOTE — Progress Notes (Addendum)
Advanced Heart Failure Rounding Note   Subjective:    Chest CT 8/17  with suggestion of severe CAD with totally occluded LAD and high-grade ramus lesion. Also LLL nodule suggestive of lung CA (versus infection)  Started on amio yesterday for PVCs. Anxious but ready for cath. Ambulating room. No CP. SOB improved. No orthopnea or PND.    Objective:   Weight Range:  Vital Signs:   Temp:  [98.3 F (36.8 C)-98.8 F (37.1 C)] 98.3 F (36.8 C) (08/20 0423) Pulse Rate:  [37-92] 73 (08/20 0423) Resp:  [18] 18 (08/20 0423) BP: (96-139)/(62-97) 96/64 (08/20 0423) SpO2:  [93 %-97 %] 93 % (08/20 0423) Weight:  [87.8 kg] 87.8 kg (08/20 0423) Last BM Date: 12/14/18  Weight change: Filed Weights   12/14/18 0457 12/15/18 0430 12/16/18 0423  Weight: 87.2 kg 86.3 kg 87.8 kg    Intake/Output:   Intake/Output Summary (Last 24 hours) at 12/16/2018 0813 Last data filed at 12/16/2018 0758 Gross per 24 hour  Intake 1704.1 ml  Output 1475 ml  Net 229.1 ml     Physical Exam: General:  Well appearing. No resp difficulty HEENT: normal Neck: supple. no JVD. Carotids 2+ bilat; no bruits. No lymphadenopathy or thryomegaly appreciated. Cor: PMI nondisplaced. Regular rate & rhythm. No rubs, gallops or murmurs. Lungs: clear Abdomen: soft, nontender, nondistended. No hepatosplenomegaly. No bruits or masses. Good bowel sounds. Extremities: no cyanosis, clubbing, rash, edema Neuro: alert & orientedx3, cranial nerves grossly intact. moves all 4 extremities w/o difficulty. Affect pleasant   Telemetry: NSR 70-80s with frequent polymorphic PVCs = Personally reviewed   Labs: Basic Metabolic Panel: Recent Labs  Lab 12/11/18 0511 12/12/18 0519 12/13/18 1237 12/14/18 0512 12/15/18 0513 12/16/18 0639  NA 137 133* 136 135 135 133*  K 4.3 3.4* 3.8 4.2 3.4* 3.7  CL 98 93* 101 99 98 100  CO2 29 27 25 26 23 23   GLUCOSE 105* 117* 134* 108* 110* 99  BUN 18 21* 19 19 24* 20  CREATININE 1.32*  1.37* 1.26* 1.18 1.29* 1.09  CALCIUM 9.5 9.1 9.0 9.0 9.0 8.7*  MG 2.1 2.0 2.1 2.2 2.1  --   PHOS 4.2 4.6 3.3 3.8 4.6  --     Liver Function Tests: Recent Labs  Lab 12/10/18 1154 12/11/18 0511 12/12/18 0519 12/13/18 1237 12/15/18 0513  AST 21 27 34 25 19  ALT 29 34 45* 34 27  ALKPHOS 76 73 79 78 75  BILITOT 0.7 0.8 1.2 1.1 1.1  PROT 7.2 7.9 7.6 7.1 7.3  ALBUMIN 3.7 3.7 3.7 3.6 3.5   Recent Labs  Lab 12/09/18 1947  LIPASE 31   No results for input(s): AMMONIA in the last 168 hours.  CBC: Recent Labs  Lab 12/11/18 0511 12/12/18 0519 12/13/18 1237 12/14/18 0512 12/15/18 0513  WBC 10.5 11.4* 9.6 10.3 10.8*  NEUTROABS 6.0 6.9 6.1 6.2 6.9  HGB 16.4 16.0 15.8 16.0 15.5  HCT 50.5 49.0 48.9 49.4 47.3  MCV 91.8 90.6 91.4 92.2 89.8  PLT 228 238 243 247 231    Cardiac Enzymes: No results for input(s): CKTOTAL, CKMB, CKMBINDEX, TROPONINI in the last 168 hours.  BNP: BNP (last 3 results) Recent Labs    12/10/18 0248  BNP 1,200.8*    ProBNP (last 3 results) No results for input(s): PROBNP in the last 8760 hours.    Other results:  Imaging: Ct Coronary Morph W/cta Cor W/score W/ca W/cm &/or Wo/cm  Addendum Date: 12/14/2018   ADDENDUM  REPORT: 12/14/2018 16:25 CLINICAL DATA:  53 year old male with new diagnosis of acute systolic CHF and an abnormal stress test. EXAM: Cardiac/Coronary  CTA TECHNIQUE: The patient was scanned on a [employer]. FINDINGS: A 100 kV prospective scan was triggered in the descending thoracic aorta at 111 HU's. Axial non-contrast 3 mm slices were carried out through the heart. The data set was analyzed on a dedicated work station and scored using the [place]. Gantry rotation speed was 250 msecs and collimation was .6 mm. 15 mg of PO Ivabradine, 10 mg of iv Metoprolol and 0.4 Mg of sl NTG was given. The 3D data set was reconstructed in 5% intervals of the 67-82 % of the R-R cycle. Diastolic phases were analyzed on a dedicated work  station using MPR, MIP and VRT modes. The patient received 80 cc of contrast. Aorta: Normal size. Mild diffuse atherosclerotic disease and calcifications. No dissection. Aortic Valve:  Trileaflet.  No calcifications. Coronary Arteries:  Normal coronary origin.  Right dominance. RCA is a large dominant artery that gives rise to PDA and PLA. RCA is affected by motion by there is only minimal diffuse plaque, PDA and PLA are small lumen arteries that are not well visualized. Left main is a large artery that gives rise to LAD, a large ramus intermedius and LCX arteries. Left main has minimal plaque. LAD is a large vessel that gives rise to one diagonal artery. Proximal LAD has severe, predominantly non-calcified plaque with subtotal stenosis > 99%. This is followed by another severe predominantly calcified plaque with severe stenosis. Mid and distal LAD have very small lumen most probably sec to proximal stenosis. D1 is a small lumen artery with mild calcified ostial plaque with stenosis 25-49%. Ramus intermedius is a large artery that has moderate diffuse mixed plaque. Interpretation is affected by motion but there is possibly severe stenosis in the mid portion. LCX is a small non-dominant artery that has mild diffuse calcified plaque. Other findings: Normal pulmonary vein drainage into the left atrium. Normal left atrial appendage without a thrombus. Normal size of the pulmonary artery. IMPRESSION: 1. Coronary calcium score of 120. This was 22 percentile for age and sex matched control. 2. Study quality is affected significantly by motion secondary to frequent ventricular ectopy. 3. Normal coronary origin with right dominance. 4. Proximal LAD has severe, predominantly non-calcified plaque with subtotal stenosis > 99%. This is followed by another severe predominantly calcified plaque with severe stenosis. Interpretation of ramus intermedius is affected by motion but there is possibly severe stenosis in the mid portion.  CAD-RADS 4 Severe stenosis. (70-99%). Cardiac catheterization is recommended. Electronically Signed   By: Ena Dawley   On: 12/14/2018 16:25   Result Date: 12/14/2018 EXAM: OVER-READ INTERPRETATION  CT CHEST The following report is an over-read performed by radiologist Dr. Abigail Miyamoto of [facility], [place] on 12/14/2018. This over-read does not include interpretation of cardiac or coronary anatomy or pathology. The coronary CTA interpretation by the cardiologist is attached. COMPARISON:  Chest radiograph 12/13/2018.  No prior CT. FINDINGS: Vascular: Normal aortic caliber with mild aortic atherosclerosis. No central pulmonary embolism, on this non-dedicated study. Mediastinum/Nodes: Prominent but not pathologically enlarged lower mediastinal nodes, including a node of 8 mm within the azygoesophageal recess. Borderline right hilar adenopathy at 1.3 cm on 07/11. Lungs/Pleura: No imaged pleural fluid. Dependent right lower lobe ground-glass and airspace disease. Left lower lobe more nodular opacity with mild surrounding ground-glass. Example 2.5 x 2.0 cm on image 14/5. Upper Abdomen:  Normal imaged portions of the liver, spleen, stomach. Musculoskeletal: No acute osseous abnormality. IMPRESSION: 1. Right lower lobe airspace disease, suspicious for infection or aspiration. 2. Left lower lobe pulmonary nodule. This is suspicious for primary bronchogenic carcinoma. Given the right lower lobe airspace disease, infectious etiology is possible but felt less likely. Consider antibiotic therapy and CT follow-up at 2-3 weeks. 3. Borderline right hilar adenopathy, most likely reactive. 4.  Aortic Atherosclerosis (ICD10-I70.0). These results will be called to the ordering clinician or representative by the Radiologist Assistant, and communication documented in the PACS or zVision Dashboard. Electronically Signed: By: Abigail Miyamoto M.D. On: 12/14/2018 15:56   Ct Coronary Fractional Flow Reserve Fluid Analysis  Result  Date: 12/15/2018 EXAM: CT FFR ANALYSIS CLINICAL DATA:  54 year old male with new diagnosis of cardiomyopathy and abnormal coronary CTA. FINDINGS: FFRct analysis was performed on the original cardiac CT angiogram dataset. Diagrammatic representation of the FFRct analysis is provided in a separate PDF document in PACS. This dictation was created using the PDF document and an interactive 3D model of the results. 3D model is not available in the EMR/PACS. Normal FFR range is >0.80. 1. Left Main: 0.99. 2. LAD: Ostial: 0.99.  Proximal: Occluded. 3. Ramus intermedius: Ostial; 0.99.  Proximal: < 0.5 4. LCX: 0.95. 5. RCA: Proximal: 0.99, distal: 0.94 IMPRESSION: 1. CT FFR analysis showed occluded LAD at the proximal segment and severe stenosis in the proximal portion of a large ramus intermedius. Electronically Signed   By: Ena Dawley   On: 12/15/2018 00:24     Medications:     Scheduled Medications:  [MAR Hold] amiodarone  200 mg Oral BID   [MAR Hold] aspirin EC  81 mg Oral Daily   [MAR Hold] atorvastatin  80 mg Oral q1800   [MAR Hold] carvedilol  3.125 mg Oral BID   [MAR Hold] enoxaparin (LOVENOX) injection  40 mg Subcutaneous Q24H   [MAR Hold] guaiFENesin  1,200 mg Oral BID   [MAR Hold] losartan  25 mg Oral Daily   [MAR Hold] pantoprazole  40 mg Oral Daily   [MAR Hold] sodium chloride flush  3 mL Intravenous Q12H   [MAR Hold] sodium chloride flush  3 mL Intravenous Q12H   [MAR Hold] spironolactone  12.5 mg Oral Daily    Infusions:  [MAR Hold] sodium chloride 250 mL (12/14/18 1722)   sodium chloride     sodium chloride 10 mL/hr at 12/16/18 0534    PRN Medications: [MAR Hold] sodium chloride, sodium chloride, [MAR Hold] acetaminophen, [MAR Hold] benzonatate, [MAR Hold] metoprolol tartrate, [MAR Hold] nitroGLYCERIN, [MAR Hold] ondansetron (ZOFRAN) IV, [MAR Hold] sodium chloride flush, sodium chloride flush   Assessment/Plan:   1) Acute systolic HF - EF 88-41% by echo -  EF 16% by Myoview with possible TID and mild ischemia - CTA on 8/18 suggestion of severe CAD with totally occluded LAD and high-grade ramus lesion. Also LLL nodule suggestive of lung CA (vs infection) - Agrees to cath today - Suspect Frequent PVCs may also be contributing to LV dysfunction  - NYHA III-IIIb  - Volume looks good now. Trending back up today. Will assess filling pressures on cath today.  - Continue digoxin - Continue carvedilol, losartan and spiro. Titrate as needed. 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 May be infectious. Suggest re-imaging in 3 weeks but if needs coronary stent will be on plavix then and unable to biopsy. - May need to biopsy sooner - Unasyn stopped yesterday - Likely reasonable to finish with course of doxy  5) Lack of social support - Will need close f/u in HF Clinic with likely involvement of Paramedicine and SW teams      Length of Stay: 5   Glori Bickers MD 12/16/2018, 8:13 AM  Advanced Heart Failure Team Pager [phone] (M-F; [place])  Please contact [facility] for night-coverage after hours (4p -7a ) and weekends on [email or web address]

## 2018-12-16 NOTE — Progress Notes (Signed)
  Speech Language Pathology Treatment: Dysphagia  Patient Details Name: Jonathan King MRN: [id number] DOB: 1966/02/25 Today's Date: 12/16/2018 Time: 1220-1240 SLP Time Calculation (min) (ACUTE ONLY): 20 min  Assessment / Plan / Recommendation Clinical Impression  Patient seen to address dysphagia goals at lunch time meal with his roommate/friend present. Patient is with premorbid cognitive impairment and roommate provides majority of history. During PO intake of regular solids and thin liquids, no overt s/s of aspiration or penetration were observed. Roommate reported that at home, approximately every 2-3 months, he will have incident during night of "an attack" where he cant breath where he is so loud he is banging on the walls and she stated she has attempted heimlich and that then he is ok (but no expulsion of food or other material). Patient reports he has thrown up at times, but roommate denies this. Patient appears to be tolerating regular solids, thin liquids PO's orally and pharyngeally, but based on previous reports, concern for possible esophageal dysphagia remain.    HPI HPI: Pt is a 53 y.o. male who presented with dyspnea felt to be secondary to volume overload. 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 Refused LHC and underwent cardiac CT, which described probable aspiration RLL and mass-like lesion on the left.No significant PMH noted.      SLP Plan  Continue with current plan of care       Recommendations  Diet recommendations: Regular;Thin liquid Liquids provided via: Cup;Straw Medication Administration: Whole meds with liquid Compensations: Slow rate;Small sips/bites Postural Changes and/or Swallow Maneuvers: Seated upright 90 degrees;Upright 30-60 min after meal                Oral Care Recommendations: Oral care BID SLP Visit Diagnosis: Dysphagia, unspecified (R13.10) Plan: Continue with current plan of care       GO                Dannial Monarch 12/16/2018, 5:37 PM   Sonia Baller, MA, [facility] Pager: [phone]

## 2018-12-16 NOTE — Progress Notes (Signed)
PROGRESS NOTE  ARIS DEROUSSE ZOX:[id number] DOB: 12-10-1965 DOA: 12/09/2018 PCP: Patient, No Pcp Per  Brief History   The patient is a 53 year old Caucasian obese male with no real appreciable past medical history except history of intermittent alcohol abuse who presents with a chief complaint of grossly worsening shortness of breath, nonproductive cough as well as abdominal discomfort and swelling. He states that he has had trouble laying flat at night and tosses and turns but denies any lower extremity swelling. He states that symptoms started about a month ago insidiously and have progressively worsened. Also noted to have some mild abdominal discomfort and nausea over the same interval but was recently prescribed Protonix and Zofran and states that this is alleviated his abdominal discomfort.   He presented to the ED and was found to be afebrile and saturating 90% on room air while at rest but was slightly tachypneic. Chest x-ray was notable for increased cardiac silhouette from prior and BNP was elevated at 1200.8. Of note SARS-CoV-2 testing was still pending. CHF of unknown type given his persistently worsening shortness of breath with orthopnea and elevated BNP along with increased cardiac silhouette. He was started on diuresis and echocardiogram ordered.  Echocardiogram was done and was severely abnormal and showed an EF of 15 to 20% with severe global hypokinesis with moderately dilated left ventricle and grade 3 diastolic dysfunction with elevated left filling pressures, moderate right ventricular hypokinesis as well as mild LAE, moderate Mitral Regurg and trivial AI and dilated IVC.   Cardiology was consulted and recommending changing diuresis because of worsening renal function and planning on cathing the patient on Monday to rule out ischemia. Unfortunately the patient is very averse to getting a cardiac cath so he will undergo a stress Myoview test and this was done on 12/14/2018  along with a CTA chest. It demonstrated severe CAD with totally occluded LAD and high-grade ramus lesion. Also LLL nodule suggestive of lung CA.  Dr. Milas Kocher has discussed this with the patient and his roommate. He is hopeful that the patient will change his mind and go for [facility] tomorrow.    IV Diuresis is now stopped because of worsening renal function and because the patient appeared dry so was changed to po this AM. Patient continues to have frequent PVCs including ventricular bigeminy and cardiology considering antiarrhythmics given that the low-dose beta-blocker is ineffective and because the patient is bradycardic.  Cardiology has also added spironolactone 12.5 mg p.o. daily as well as losartan 25 mg p.o. daily S Dr. Jens Som does not think the patient can tolerate Entresto at this point and recommending considering it as an outpatient.  They will consider a cardiac MRI after discharge and referral to the CHF clinic in a recommending a follow-up echocardiogram in 3 months.  After Myoview cardiac stress and CTA chest which suggested severe CAD, the patient finally agreed to [facility] on 12/16/2018. The study revealed: Assessment: 1. 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 Well-compensated filling pressures with moderately reduced CO  [facility] has been consulted to evaluate the patient for possible CABG.   Consultants   Cardiology  Pulmonary Critical Care  Procedures   Myoview stress test.  Antibiotics   Anti-infectives (From admission, onward)   Start     Dose/Rate Route Frequency Ordered Stop   12/16/18 1030  doxycycline (VIBRA-TABS) tablet 100 mg     100 mg Oral Every 12 hours 12/16/18 1028 12/23/18 0959  12/14/18 1700  Ampicillin-Sulbactam (UNASYN) 3 g in sodium chloride 0.9 % 100 mL IVPB  Status:  Discontinued     3 g 200 mL/hr over 30 Minutes Intravenous Every 8 hours 12/14/18 1654 12/15/18 1019        Subjective  The  patient is resting comfortably. No new complaints.  Objective   Vitals:  Vitals:   12/16/18 1237 12/16/18 1250  BP: (!) 89/66 99/81  Pulse: 74 78  Resp:  16  Temp:  98.1 F (36.7 C)  SpO2:  96%    Exam:  Constitutional:   The patient is awake, alert, and oriented x 3. No acute distress. Respiratory:   No increased work of breathing.  No wheezes, rales, or rhonchi.  No tactile fremitus. Cardiovascular:   Regular rate and rhythm  No murmurs, ectopy, or gallups.  No lateral PMI. No thrills. Abdomen:   Abdomen is soft, non-tender, non-distended.  No hernias, masses, or organomegaly  Normoactive bowel sounds.  Musculoskeletal:   No cyanosis, clubbing, or edema Skin:   No rashes, lesions, ulcers  palpation of skin: no induration or nodules Neurologic:   CN 2-12 intact  Sensation all 4 extremities intact Psychiatric:   Mental status o Mood, affect appropriate o Orientation to person, place, time   judgment and insight appear intact     I have personally reviewed the following:   Today's Data   Vitals, CMP, CBC  Imaging   CTA chest  Cardiology Data   Myoview stress test.   Scheduled Meds:  amiodarone  200 mg Oral BID   aspirin EC  81 mg Oral Daily   atorvastatin  80 mg Oral q1800   carvedilol  3.125 mg Oral BID   doxycycline  100 mg Oral Q12H   enoxaparin (LOVENOX) injection  40 mg Subcutaneous Q24H   [START ON 12/17/2018] enoxaparin (LOVENOX) injection  40 mg Subcutaneous Q24H   guaiFENesin  1,200 mg Oral BID   losartan  25 mg Oral Daily   pantoprazole  40 mg Oral Daily   sodium chloride flush  3 mL Intravenous Q12H   sodium chloride flush  3 mL Intravenous Q12H   sodium chloride flush  3 mL Intravenous Q12H   spironolactone  12.5 mg Oral Daily   Continuous Infusions:  sodium chloride 250 mL (12/14/18 1722)   sodium chloride      Principal Problem:   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  (HCC) Active Problems:   Acute systolic (congestive) heart failure (HCC)   NSVT (nonsustained ventricular tachycardia) (HCC)   Pre-diabetes   GERD (gastroesophageal reflux disease)   Cough   Hypokalemia   Hyponatremia   Hypochloremia   Elevated ALT measurement   Leukocytosis   Elevated TSH   SOB (shortness of breath)   Aspiration pneumonia of right lower lobe (HCC)   Lung mass   LOS: 5 days   A & P    Acute Combined Systolic and Diastolic CHFExacerbation, improved Cardiomyopathy, Unclear Etiology: The patient presented with insidiously worsening SOB with orthopnea, no swelling, no fever, and no chest pain.Likely due to ischemic heart disease. Cardiology and advanced heart failure team have been consulted. The patient had been receiving IV lasix bid, but that has now been stopped due to worsening renal function. ACE has also been discontinued. It has been recommended that the patient be started on Entresto as outpatient after discharge if his blood pressure allows. Echocardiogram performed on 12/11/2018 showed LVEF 15-20%, severe global hypokinesis with  moderately dilated LV, grade 3 DD with elevated LV filling pressure, moderate RV hypokinesis, mild LAE, moderate MR, trivial AI, dilated IVC. Monitor volume status with strict I's and O's and daily weights and fluid restrict to 1500 mL's. Patient is -7,504.7 cc. He has been started on spironolactone12.5 mg PO daily and losartan 25 mg daily.   NSVT and frequent PVC's: Likely due to ischemia. Patient has been started on coreg bid. Continue to monitor on telemetry. Possibly for [facility] tomorrow if patient will agree. Continue tomonitor renal function and electrolytes during diuresis. Continue to Hold on ACE currently and start Entresto Likely when renal function improves; BB to be started and the patient will be started on Carvedilol 3.125 mg twice daily given his frequent PVCs and nonsustained V. Tach. Keep K>4.0 and Mg > 2.0. Sleep study is  strongly recommended as outpatient. Cardiology will quantify on telemetry and if it is greater than 10% they will need amiodarone to suppress. They are going to add digoxin for his heart failure but patient was given Ivbradine earlier.  CAD: Patient was ruled out for MI by EKG and enzyme criteria. He underwent Myoview stress test and CTA chest which suggested severe CAD with totally occluded LAD and high-grade ramus lesion. Also LLL nodule suggestive of lung CA. While nonspecific, this can be seen in the setting of diffuse endocardial reversible ischemia as can be seen with multivessel coronary artery disease. Additionally, there is a small area of relatively more severe reversible ischemia along the apically anterior and anteroseptal walls along the distribution of the left anterior descending coronary artery. Marked left ventricular dilation with severe global hypokinesis. Left ventricular ejection fraction 16%."  Dr. Milas Kocher spoke to the patient and his roommate today trying to persuade the patient to go forward with [facility] tomorrow. He has refussed so far due to concern for CVA as a result of study.  The patient has been empirically started on high dose statin and ASA 81 mg daily. The patient underwent LHC today that demonstrated:  Assessment: 1. 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 Well-compensated filling pressures with moderately reduced CO [facility] has been consulted to evaluate the patient for possible CABG.  Hyperglycemia in the setting of Pre-Diabetes: HbA1c was 5.9. FSBS for the last 24 hours have been 108 - 134. Monitor.   Pericardial Effusion, ruled out: Ruled out by echocardiogram after CXR raised the possibility of pericardial effusion.  GERD: The patient is receiving Pantoprazole 40 mg po Daily.  Cough: Improving. Continue benzonatate and guaifenesin.  AKI/Renal Insufficiency: Improved now with discontinuation of lasix.  Avoid Nephrotoxic Medications, Contrast Dyes, as well as Hypotension if possible. Monitor creatinine, electrolytes, and volume status.   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 : Pulmonology consulted. They have recommended CT guided biopsy with follow up with oncology pending result.  Infiltrate seen on CTA in the the right lobe: Patient was started on IV Unasyn. He has been given a flutter valve and incentive spirometry. CRP is <0.8 and Procalcitonin is 0.11. Antibiotics have been stopped. Monitor WBC.   Hyponatremia/hypochloremia: Resolved. Monitor.  Hypokalemia: 2.6 this am. Keep K>4 as per cardiology. Supplement and monitor.  Elevated LFT: Improved. Likely reactive and will continue to monitor and trend.  Leukocytosis: Improved. 8.2 this morning. Monitor. No signs of infection.   Elevated TSH; suspected euthyroid sick syndrome. TSH , T4 and FT3 should be rechecked after patient is completely recovered in 4-6 weeks.  Mitral Regurgitation: Moderate per echocardiogram. Cardiology is recommending reassessment with an echocardiogram once cardiac medications are fully titrated in 3 months.  I have seen and examined this patient myself. I have spent 36 minutes in his evaluation and care.  DVT prophylaxis: Enoxaparin 40 mg sq q24h Code Status: FULL CODE Family Communication: No family present at bedside  Disposition Plan: Anticipating discharge home tomorrow pending cardiac clearance  Eudora Guevarra, DO [facility] Direct contact: see [email or web address]  7PM-7AM contact night coverage as above 12/16/2018, 4:42 PM  LOS: 4 days

## 2018-12-16 NOTE — Progress Notes (Signed)
Patient;s bed was not in the lowest position, patient was educated about safety issues with his bed being higher. He refused to put the bed at its lowest position and refused alarm. Will continue to monitor.

## 2018-12-17 ENCOUNTER — Inpatient Hospital Stay (HOSPITAL_COMMUNITY): Payer: Self-pay

## 2018-12-17 DIAGNOSIS — I2510 Atherosclerotic heart disease of native coronary artery without angina pectoris: Secondary | ICD-10-CM

## 2018-12-17 DIAGNOSIS — I340 Nonrheumatic mitral (valve) insufficiency: Secondary | ICD-10-CM

## 2018-12-17 DIAGNOSIS — I255 Ischemic cardiomyopathy: Secondary | ICD-10-CM

## 2018-12-17 LAB — CBC WITH DIFFERENTIAL/PLATELET
Abs Immature Granulocytes: 0.03 10*3/uL (ref 0.00–0.07)
Basophils Absolute: 0.1 10*3/uL (ref 0.0–0.1)
Basophils Relative: 1 %
Eosinophils Absolute: 0.4 10*3/uL (ref 0.0–0.5)
Eosinophils Relative: 5 %
HCT: 45.1 % (ref 39.0–52.0)
Hemoglobin: 14.3 g/dL (ref 13.0–17.0)
Immature Granulocytes: 0 %
Lymphocytes Relative: 31 %
Lymphs Abs: 2.5 10*3/uL (ref 0.7–4.0)
MCH: 29.4 pg (ref 26.0–34.0)
MCHC: 31.7 g/dL (ref 30.0–36.0)
MCV: 92.6 fL (ref 80.0–100.0)
Monocytes Absolute: 0.6 10*3/uL (ref 0.1–1.0)
Monocytes Relative: 8 %
Neutro Abs: 4.5 10*3/uL (ref 1.7–7.7)
Neutrophils Relative %: 55 %
Platelets: 204 10*3/uL (ref 150–400)
RBC: 4.87 MIL/uL (ref 4.22–5.81)
RDW: 13.2 % (ref 11.5–15.5)
WBC: 8.2 10*3/uL (ref 4.0–10.5)
nRBC: 0 % (ref 0.0–0.2)

## 2018-12-17 LAB — COMPREHENSIVE METABOLIC PANEL
ALT: 29 U/L (ref 0–44)
AST: 26 U/L (ref 15–41)
Albumin: 3.2 g/dL — ABNORMAL LOW (ref 3.5–5.0)
Alkaline Phosphatase: 65 U/L (ref 38–126)
Anion gap: 8 (ref 5–15)
BUN: 21 mg/dL — ABNORMAL HIGH (ref 6–20)
CO2: 21 mmol/L — ABNORMAL LOW (ref 22–32)
Calcium: 8.3 mg/dL — ABNORMAL LOW (ref 8.9–10.3)
Chloride: 105 mmol/L (ref 98–111)
Creatinine, Ser: 1.12 mg/dL (ref 0.61–1.24)
GFR calc Af Amer: >60 mL/min (ref >60)
GFR calc non Af Amer: >60 mL/min (ref >60)
Glucose, Bld: 121 mg/dL — ABNORMAL HIGH (ref 70–99)
Potassium: 4.2 mmol/L (ref 3.5–5.1)
Sodium: 134 mmol/L — ABNORMAL LOW (ref 135–145)
Total Bilirubin: 0.8 mg/dL (ref 0.3–1.2)
Total Protein: 6.7 g/dL (ref 6.5–8.1)

## 2018-12-17 LAB — BASIC METABOLIC PANEL
Anion gap: 7 (ref 5–15)
BUN: 21 mg/dL — ABNORMAL HIGH (ref 6–20)
CO2: 24 mmol/L (ref 22–32)
Calcium: 8.5 mg/dL — ABNORMAL LOW (ref 8.9–10.3)
Chloride: 103 mmol/L (ref 98–111)
Creatinine, Ser: 1.18 mg/dL (ref 0.61–1.24)
GFR calc Af Amer: >60 mL/min (ref >60)
GFR calc non Af Amer: >60 mL/min (ref >60)
Glucose, Bld: 113 mg/dL — ABNORMAL HIGH (ref 70–99)
Potassium: 4.3 mmol/L (ref 3.5–5.1)
Sodium: 134 mmol/L — ABNORMAL LOW (ref 135–145)

## 2018-12-17 LAB — BLOOD GAS, ARTERIAL
Acid-base deficit: 1.5 mmol/L (ref 0.0–2.0)
Bicarbonate: 22 mmol/L (ref 20.0–28.0)
Drawn by: 511551
FIO2: 21
O2 Saturation: 95.2 %
Patient temperature: 98.6
pCO2 arterial: 32 mmHg (ref 32.0–48.0)
pH, Arterial: 7.451 — ABNORMAL HIGH (ref 7.350–7.450)
pO2, Arterial: 81.2 mmHg — ABNORMAL LOW (ref 83.0–108.0)

## 2018-12-17 LAB — TSH: TSH: 17.794 u[IU]/mL — ABNORMAL HIGH (ref 0.350–4.500)

## 2018-12-17 LAB — SURGICAL PCR SCREEN
MRSA, PCR: NEGATIVE
Staphylococcus aureus: NEGATIVE

## 2018-12-17 LAB — PROTIME-INR
INR: 1.1 (ref 0.8–1.2)
Prothrombin Time: 14.2 seconds (ref 11.4–15.2)

## 2018-12-17 MED ORDER — POTASSIUM CHLORIDE 10 MEQ/100ML IV SOLN
INTRAVENOUS | Status: AC
  Filled 2018-12-17: qty 100

## 2018-12-17 MED ORDER — SPIRONOLACTONE 25 MG PO TABS
25.0000 mg | ORAL_TABLET | Freq: Every day | ORAL | Status: DC
  Administered 2018-12-17 – 2018-12-20 (×4): 25 mg via ORAL
  Filled 2018-12-17 (×4): qty 1

## 2018-12-17 MED ORDER — GUAIFENESIN-DM 100-10 MG/5ML PO SYRP: 5.0000 mL | ORAL_SOLUTION | ORAL | Status: DC | PRN

## 2018-12-17 MED ORDER — IOHEXOL 300 MG/ML  SOLN
80.0000 mL | Freq: Once | INTRAMUSCULAR | Status: AC | PRN
  Administered 2018-12-17: 80 mL via INTRAVENOUS

## 2018-12-17 NOTE — Consult Note (Addendum)
[address]       [place] [address]             [phone]        Jonathan King  [facility] Record #[id number] Date of Birth: 02-Jul-1965  Referring: D Bensimhon  Primary Care: Patient, No Pcp Per Primary Cardiologist:No primary care provider on file.  Chief Complaint:    Chief Complaint  Patient presents with  . Shortness of Breath  patient examined and coronary arteriograms reviewed  History of Present Illness:     53 yo nonsmoker presents wit CHF and EF .20 Cath shows occluded LAD, patent dominant RCA and stenosis of OM,OD, Diagonal RHC shows compensated cardiac output without elevated filling pressures Cardiac CT partially shows LLL 2.5 cm density- formal chest CT pending Echo with moderate MR Has hx nonsustained VT Now stable on medical therapy Current Activity/ Functional Status:  patient works at [employer]    Zubrod Score: At the time of surgery this patient's most appropriate activity status/level should be described as: []     0    Normal activity, no symptoms []     1    Restricted in physical strenuous activity but ambulatory, able to do out light work [x]     2    Ambulatory and capable of self care, unable to do work activities, up and about                 more than 50%  Of the time                            []     3    Only limited self care, in bed greater than 50% of waking hours []     4    Completely disabled, no self care, confined to bed or chair []     5    Moribund  Past Medical History:  Diagnosis Date  . Back pain     Past Surgical History:  Procedure Laterality Date  . CHOLECYSTECTOMY  01/31/2012   Procedure: LAPAROSCOPIC CHOLECYSTECTOMY;  Surgeon: Gayland Curry, MD,FACS;  Location: [facility];  Service: General;  Laterality: N/A;  . RIGHT/LEFT HEART CATH AND CORONARY ANGIOGRAPHY N/A 12/16/2018   Procedure: RIGHT/LEFT HEART CATH AND CORONARY ANGIOGRAPHY;  Surgeon: Jolaine Artist, MD;  Location: [facility];  Service: Cardiovascular;  Laterality: N/A;    Social History   Tobacco Use  Smoking Status Never Smoker  Smokeless Tobacco Never Used    Social History   Substance and Sexual Activity  Alcohol Use Yes   Comment: occ     No Known Allergies  Current Facility-Administered Medications  Medication Dose Route Frequency Provider Last Rate Last Dose  . 0.9 %  sodium chloride infusion  250 mL Intravenous PRN Bensimhon, Shaune Pascal, MD 10 mL/hr at 12/14/18 1722 250 mL at 12/14/18 1722  . 0.9 %  sodium chloride infusion  250 mL Intravenous PRN Bensimhon, Shaune Pascal, MD      . acetaminophen (TYLENOL) tablet 650 mg  650 mg Oral Q4H PRN Bensimhon, Shaune Pascal, MD   650 mg at 12/13/18 2209  . acetaminophen (TYLENOL) tablet 650 mg  650 mg Oral Q4H PRN Bensimhon, Shaune Pascal, MD      . amiodarone (PACERONE) tablet 200 mg  200 mg Oral BID Bensimhon, Shaune Pascal, MD   200 mg at 12/17/18 0867  .  aspirin EC tablet 81 mg  81 mg Oral Daily Bensimhon, Shaune Pascal, MD   81 mg at 12/17/18 0902  . atorvastatin (LIPITOR) tablet 80 mg  80 mg Oral q1800 Bensimhon, Shaune Pascal, MD   80 mg at 12/16/18 1729  . benzonatate (TESSALON) capsule 200 mg  200 mg Oral TID PRN Bensimhon, Shaune Pascal, MD   200 mg at 12/17/18 0330  . carvedilol (COREG) tablet 3.125 mg  3.125 mg Oral BID Bensimhon, Shaune Pascal, MD   3.125 mg at 12/17/18 5188  . doxycycline (VIBRA-TABS) tablet 100 mg  100 mg Oral Q12H Bensimhon, Shaune Pascal, MD   100 mg at 12/17/18 0902  . enoxaparin (LOVENOX) injection 40 mg  40 mg Subcutaneous Q24H Bensimhon, Shaune Pascal, MD   40 mg at 12/16/18 1729  . enoxaparin (LOVENOX) injection 40 mg  40 mg Subcutaneous Q24H Bensimhon, Shaune Pascal, MD   40 mg at 12/17/18 0902  . guaiFENesin (MUCINEX) 12 hr tablet 1,200 mg  1,200 mg Oral BID Bensimhon, Shaune Pascal, MD   1,200 mg at 12/17/18 0902  . guaiFENesin-dextromethorphan (ROBITUSSIN DM) 100-10 MG/5ML syrup 5 mL  5 mL Oral Q4H PRN Swayze, Ava, DO      . losartan (COZAAR) tablet 25 mg  25 mg Oral  Daily Bensimhon, Shaune Pascal, MD   25 mg at 12/17/18 0903  . metoprolol tartrate (LOPRESSOR) injection 5 mg  5 mg Intravenous Q5 min PRN Bensimhon, Shaune Pascal, MD   5 mg at 12/14/18 1456  . nitroGLYCERIN (NITROSTAT) SL tablet 0.4 mg  0.4 mg Sublingual Q5 min PRN Bensimhon, Shaune Pascal, MD      . ondansetron (ZOFRAN) injection 4 mg  4 mg Intravenous Q6H PRN Bensimhon, Shaune Pascal, MD      . ondansetron (ZOFRAN) injection 4 mg  4 mg Intravenous Q6H PRN Bensimhon, Shaune Pascal, MD      . pantoprazole (PROTONIX) EC tablet 40 mg  40 mg Oral Daily Bensimhon, Shaune Pascal, MD   40 mg at 12/17/18 0902  . sodium chloride flush (NS) 0.9 % injection 3 mL  3 mL Intravenous Q12H Bensimhon, Shaune Pascal, MD   3 mL at 12/17/18 0905  . sodium chloride flush (NS) 0.9 % injection 3 mL  3 mL Intravenous PRN Bensimhon, Shaune Pascal, MD   3 mL at 12/16/18 1731  . sodium chloride flush (NS) 0.9 % injection 3 mL  3 mL Intravenous Q12H Bensimhon, Shaune Pascal, MD   3 mL at 12/17/18 0905  . sodium chloride flush (NS) 0.9 % injection 3 mL  3 mL Intravenous Q12H Bensimhon, Shaune Pascal, MD   3 mL at 12/17/18 0905  . sodium chloride flush (NS) 0.9 % injection 3 mL  3 mL Intravenous PRN Bensimhon, Shaune Pascal, MD      . spironolactone (ALDACTONE) tablet 25 mg  25 mg Oral Daily Bensimhon, Shaune Pascal, MD   25 mg at 12/17/18 4166    Medications Prior to Admission  Medication Sig Dispense Refill Last Dose  . ibuprofen (ADVIL,MOTRIN) 600 MG tablet Take 1 tablet (600 mg total) by mouth every 6 (six) hours as needed. For pain (Patient taking differently: Take 400 mg by mouth every 6 (six) hours as needed for moderate pain. ) 30 tablet 0 unk  . omeprazole (PRILOSEC) 20 MG capsule Take 1 capsule (20 mg total) by mouth daily. 30 capsule 0 12/09/2018 at Unknown time  . ondansetron (ZOFRAN ODT) 4 MG disintegrating tablet Take 1 tablet (4 mg total) by  mouth every 8 (eight) hours as needed for nausea or vomiting. 20 tablet 0 unk    Family History  Problem Relation Age of Onset   . Parkinson's disease Mother      Review of Systems:   ROS No problem with anesthesia for gall bladder surgery No chest trauma No DVT     Cardiac Review of Systems: Y or  [    ]= no  Chest Pain [ y   ]  Resting SOB [   ] Exertional SOB  [name]  ]  Orthopnea [  ]   Pedal Edema [   ]    Palpitations [name]  ] Syncope  [  ]   Presyncope [   ]  General Review of Systems: [Y] = yes [  ]=no Constitional: recent weight change [  ]; anorexia [  ]; fatigue [  ]; nausea [  ]; night sweats [  ]; fever [  ]; or chills [  ]                                                               Dental: Last Dentist visit:   Eye : blurred vision [  ]; diplopia [   ]; vision changes [  ];  Amaurosis fugax[  ]; Resp: cough [  ];  wheezing[  ];  hemoptysis[  ]; shortness of breath[  ]; paroxysmal nocturnal dyspnea[  ]; dyspnea on exertion[  ]; or orthopnea[  ];  GI:  gallstones[  ], vomiting[  ];  dysphagia[  ]; melena[  ];  hematochezia [  ]; heartburn[  ];   Hx of  Colonoscopy[  ]; GU: kidney stones [  ]; hematuria[  ];   dysuria [  ];  nocturia[  ];  history of     obstruction [  ]; urinary frequency [  ]             Skin: rash, swelling[  ];, hair loss[  ];  peripheral edema[  ];  or itching[  ]; Musculosketetal: myalgias[  ];  joint swelling[  ];  joint erythema[  ];  joint pain[  ];  back pain[  ];  Heme/Lymph: bruising[  ];  bleeding[  ];  anemia[  ];  Neuro: TIA[  ];  headaches[  ];  stroke[  ];  vertigo[  ];  seizures[  ];   paresthesias[  ];  difficulty walking[  ];  Psych:depression[  ]; anxiety[  ];  Endocrine: diabetes[  ];  thyroid dysfunction[  ];                Physical Exam: BP 105/87 (BP Location: Left Arm)   Pulse 75   Temp 97.7 F (36.5 C) (Oral)   Resp 18   Ht 6' (1.829 m)   Wt 88.9 kg   SpO2 90%   BMI 26.58 kg/m         Exam    General- alert and comfortable    Neck- no JVD, no cervical adenopathy palpable, no carotid bruit   Lungs- clear without rales, wheezes   Cor- regular  rate and rhythm, no murmur , gallop   Abdomen- soft, non-tender   Extremities - warm, non-tender, minimal edema   Neuro- oriented, appropriate,  no focal weakness   Diagnostic Studies & Laboratory data:     Recent Radiology Findings:   Dg Chest 2 View  Result Date: 12/17/2018 CLINICAL DATA:  Chest pain EXAM: CHEST - 2 VIEW COMPARISON:  CT chest December 14, 2018 FINDINGS: Stable cardiac and mediastinal contours. Persistent patchy consolidation right lower lung. Persistent nodule within the left mid lung. No pleural effusion or pneumothorax. Thoracic spine degenerative changes. IMPRESSION: Persistent patchy consolidation right lower lung as can be seen with pneumonia. Followup PA and lateral chest X-ray is recommended in 3-4 weeks following trial of antibiotic therapy to ensure resolution and exclude underlying malignancy. Persistent nodule within the left mid lung. Bronchogenic carcinoma not excluded. Recommend attention on previously recommended follow-up chest CT. Electronically Signed   By: Lovey Newcomer M.D.   On: 12/17/2018 08:02   Vas US Doppler Pre Cabg  Result Date: 12/16/2018 PREOPERATIVE VASCULAR EVALUATION  Indications:      Pre CABG. Risk Factors:     Coronary artery disease. Comparison Study: No prervious study available for comparison Performing Technologist: Birdena Crandall, [place]  Examination Guidelines: A complete evaluation includes B-mode imaging, spectral Doppler, color Doppler, and power Doppler as needed of all accessible portions of each vessel. Bilateral testing is considered an integral part of a complete examination. Limited examinations for reoccurring indications may be performed as noted.  Right Carotid Findings: +----------+--------+--------+--------+--------+----------------------------+           PSV cm/sEDV cm/sStenosisDescribeComments                     +----------+--------+--------+--------+--------+----------------------------+ CCA Prox  79      9                        mild intimal wall changes    +----------+--------+--------+--------+--------+----------------------------+ CCA Distal54      14                      mild intimal wall changes    +----------+--------+--------+--------+--------+----------------------------+ ICA Prox  67      18                      minimal intimal wall changes +----------+--------+--------+--------+--------+----------------------------+ ICA Mid   66      18                                                   +----------+--------+--------+--------+--------+----------------------------+ ICA Distal67      17                                                   +----------+--------+--------+--------+--------+----------------------------+ ECA       91      8                                                    +----------+--------+--------+--------+--------+----------------------------+ Portions of this table do not appear on this page. +----------+--------+-------+--------+------------+           PSV cm/sEDV cmsDescribeArm Pressure +----------+--------+-------+--------+------------+ Subclavian117  104          +----------+--------+-------+--------+------------+ +---------+--------+--+--------+--+ VertebralPSV cm/s59EDV cm/s11 +---------+--------+--+--------+--+ Left Carotid Findings: +----------+--------+--------+--------+--------+----------------------------+           PSV cm/sEDV cm/sStenosisDescribeComments                     +----------+--------+--------+--------+--------+----------------------------+ CCA Prox  68      11                                                   +----------+--------+--------+--------+--------+----------------------------+ CCA Distal62      7                       mild intimal wall changes    +----------+--------+--------+--------+--------+----------------------------+ ICA Prox  45      15                      minimal  intimal wall changes +----------+--------+--------+--------+--------+----------------------------+ ICA Mid   59      14                                                   +----------+--------+--------+--------+--------+----------------------------+ ICA Distal98      29                                                   +----------+--------+--------+--------+--------+----------------------------+ ECA       73      4                                                    +----------+--------+--------+--------+--------+----------------------------+ +----------+--------+--------+--------+------------+ SubclavianPSV cm/sEDV cm/sDescribeArm Pressure +----------+--------+--------+--------+------------+           85                      100          +----------+--------+--------+--------+------------+ +---------+--------+--+--------+-+ VertebralPSV cm/s51EDV cm/s5 +---------+--------+--+--------+-+  ABI Findings: +--------+------------------+-----+---------+--------+ Right   Rt Pressure (mmHg)IndexWaveform Comment  +--------+------------------+-----+---------+--------+ [id number]                    triphasic         +--------+------------------+-----+---------+--------+ PTA     111               1.07 triphasic         +--------+------------------+-----+---------+--------+ DP      145               1.39 triphasic         +--------+------------------+-----+---------+--------+ +--------+------------------+-----+---------+-------+ Left    Lt Pressure (mmHg)IndexWaveform Comment +--------+------------------+-----+---------+-------+ Brachial100                    triphasic        +--------+------------------+-----+---------+-------+ PTA     124               1.19 triphasic        +--------+------------------+-----+---------+-------+  DP      106               1.02 triphasic        +--------+------------------+-----+---------+-------+  +-------+---------------+----------------+ ABI/TBIToday's ABI/TBIPrevious ABI/TBI +-------+---------------+----------------+ Right  1.39                            +-------+---------------+----------------+ Left   1.19                            +-------+---------------+----------------+  Right Doppler Findings: +-----------+--------+-----+---------+--------+ Site       PressureIndexDoppler  Comments +-----------+--------+-----+---------+--------+ Brachial   104          triphasic         +-----------+--------+-----+---------+--------+ Radial                  triphasic         +-----------+--------+-----+---------+--------+ Ulnar                   triphasic         +-----------+--------+-----+---------+--------+ Palmar Arch                      normal   +-----------+--------+-----+---------+--------+  Left Doppler Findings: +-----------+--------+-----+---------+--------+ Site       PressureIndexDoppler  Comments +-----------+--------+-----+---------+--------+ Brachial   100          triphasic         +-----------+--------+-----+---------+--------+ Radial                  triphasic         +-----------+--------+-----+---------+--------+ Ulnar                   triphasic         +-----------+--------+-----+---------+--------+ Palmar Arch                      normal   +-----------+--------+-----+---------+--------+  Summary: Right Carotid: There is no evidence of stenosis in the right ICA. Left Carotid: There is no evidence of stenosis in the left ICA. Right ABI: Resting right ankle-brachial index indicates noncompressible right lower extremity arteries. Left ABI: Resting left ankle-brachial index is within normal range. No evidence of significant left lower extremity arterial disease. Right Upper Extremity: Doppler waveforms remain within normal limits with right radial compression. Doppler waveforms remain within normal limits with right ulnar  compression. Left Upper Extremity: Doppler waveforms remain within normal limits with left radial compression. Doppler waveforms remain within normal limits with left ulnar compression.     Preliminary      I have independently reviewed the above radiologic studies and discussed with the patient   Recent Lab Findings: Lab Results  Component Value Date   WBC 8.2 12/17/2018   HGB 14.3 12/17/2018   HCT 45.1 12/17/2018   PLT 204 12/17/2018   GLUCOSE 121 (H) 12/17/2018   ALT 29 12/17/2018   AST 26 12/17/2018   NA 134 (L) 12/17/2018   K 4.2 12/17/2018   CL 105 12/17/2018   CREATININE 1.12 12/17/2018   BUN 21 (H) 12/17/2018   CO2 21 (L) 12/17/2018   TSH 17.794 (H) 12/17/2018   INR 1.1 12/17/2018   HGBA1C 5.9 (H) 12/11/2018      Assessment / Plan:      Ischemic cardiomyopathy Moderate MR Non sustained VT 2.5 cm LLL nodule- will not be accessible thru sternotomy  Plan multivessel CABG next week 8-25 Procedure, benefits and risks d/w patient and significant other       @ME1 @ 12/17/2018 10:28 AM

## 2018-12-17 NOTE — Plan of Care (Signed)
  Problem: Nutrition: Goal: Adequate nutrition will be maintained Outcome: Completed/Met   Problem: Coping: Goal: Level of anxiety will decrease Outcome: Completed/Met   Problem: Elimination: Goal: Will not experience complications related to bowel motility Outcome: Completed/Met Goal: Will not experience complications related to urinary retention Outcome: Completed/Met   Problem: Pain Managment: Goal: General experience of comfort will improve Outcome: Completed/Met   

## 2018-12-17 NOTE — Progress Notes (Signed)
PROGRESS NOTE  Jonathan King QQV:[id number] DOB: Sep 30, 1965 DOA: 12/09/2018 PCP: Patient, No Pcp Per  Brief History   The patient is a 53 year old Caucasian obese male with no real appreciable past medical history except history of intermittent alcohol abuse who presents with a chief complaint of grossly worsening shortness of breath, nonproductive cough as well as abdominal discomfort and swelling. He states that he has had trouble laying flat at night and tosses and turns but denies any lower extremity swelling. He states that symptoms started about a month ago insidiously and have progressively worsened. Also noted to have some mild abdominal discomfort and nausea over the same interval but was recently prescribed Protonix and Zofran and states that this is alleviated his abdominal discomfort.   He presented to the ED and was found to be afebrile and saturating 90% on room air while at rest but was slightly tachypneic. Chest x-ray was notable for increased cardiac silhouette from prior and BNP was elevated at 1200.8. Of note SARS-CoV-2 testing was still pending. CHF of unknown type given his persistently worsening shortness of breath with orthopnea and elevated BNP along with increased cardiac silhouette. He was started on diuresis and echocardiogram ordered.  Echocardiogram was done and was severely abnormal and showed an EF of 15 to 20% with severe global hypokinesis with moderately dilated left ventricle and grade 3 diastolic dysfunction with elevated left filling pressures, moderate right ventricular hypokinesis as well as mild LAE, moderate Mitral Regurg and trivial AI and dilated IVC.   Cardiology was consulted and recommending changing diuresis because of worsening renal function and planning on cathing the patient on Monday to rule out ischemia. Unfortunately the patient is very averse to getting a cardiac cath so he will undergo a stress Myoview test and this was done on 12/14/2018  along with a CTA chest. It demonstrated severe CAD with totally occluded LAD and high-grade ramus lesion. Also LLL nodule suggestive of lung CA.  Dr. Milas Kocher has discussed this with the patient and his roommate. He is hopeful that the patient will change his mind and go for [facility] tomorrow.    IV Diuresis is now stopped because of worsening renal function and because the patient appeared dry so was changed to po this AM. Patient continues to have frequent PVCs including ventricular bigeminy and cardiology considering antiarrhythmics given that the low-dose beta-blocker is ineffective and because the patient is bradycardic.  Cardiology has also added spironolactone 12.5 mg p.o. daily as well as losartan 25 mg p.o. daily S Dr. Jens Som does not think the patient can tolerate Entresto at this point and recommending considering it as an outpatient.  They will consider a cardiac MRI after discharge and referral to the CHF clinic in a recommending a follow-up echocardiogram in 3 months.  After Myoview cardiac stress and CTA chest which suggested severe CAD, the patient finally agreed to [facility] on 12/16/2018. The study revealed: Assessment: 1. 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 Well-compensated filling pressures with moderately reduced CO  [facility] has been consulted to evaluate the patient for possible CABG. It has been planned for 12/21/2018.  Consultants   Cardiology  Pulmonary Critical Care  Cardiothoracic surgery  Procedures   Myoview stress test.  LHC  Antibiotics   Anti-infectives (From admission, onward)   Start     Dose/Rate Route Frequency Ordered Stop   12/16/18 1030  doxycycline (VIBRA-TABS) tablet 100 mg  100 mg Oral Every 12 hours 12/16/18 1028 12/23/18 0959   12/14/18 1700  Ampicillin-Sulbactam (UNASYN) 3 g in sodium chloride 0.9 % 100 mL IVPB  Status:  Discontinued     3 g 200 mL/hr over 30 Minutes Intravenous Every 8  hours 12/14/18 1654 12/15/18 1019        Subjective  The patient is resting comfortably. No new complaints.  Objective   Vitals:  Vitals:   12/17/18 0641 12/17/18 0901  BP: 116/76 105/87  Pulse: 92 75  Resp: 18   Temp: 97.7 F (36.5 C)   SpO2: 100% 90%    Exam:  Constitutional:   The patient is awake, alert, and oriented x 3. No acute distress. Respiratory:   No increased work of breathing.  No wheezes, rales, or rhonchi.  No tactile fremitus. Cardiovascular:   Regular rate and rhythm  No murmurs, ectopy, or gallups.  No lateral PMI. No thrills. Abdomen:   Abdomen is soft, non-tender, non-distended.  No hernias, masses, or organomegaly  Normoactive bowel sounds.  Musculoskeletal:   No cyanosis, clubbing, or edema Skin:   No rashes, lesions, ulcers  palpation of skin: no induration or nodules Neurologic:   CN 2-12 intact  Sensation all 4 extremities intact Psychiatric:   Mental status o Mood, affect appropriate o Orientation to person, place, time   judgment and insight appear intact    I have personally reviewed the following:   Today's Data   Vitals, CMP, CBC  Imaging   CTA chest  Cardiology Data   Myoview stress test.  LHC  Scheduled Meds:  amiodarone  200 mg Oral BID   aspirin EC  81 mg Oral Daily   atorvastatin  80 mg Oral q1800   carvedilol  3.125 mg Oral BID   doxycycline  100 mg Oral Q12H   enoxaparin (LOVENOX) injection  40 mg Subcutaneous Q24H   guaiFENesin  1,200 mg Oral BID   losartan  25 mg Oral Daily   pantoprazole  40 mg Oral Daily   sodium chloride flush  3 mL Intravenous Q12H   sodium chloride flush  3 mL Intravenous Q12H   sodium chloride flush  3 mL Intravenous Q12H   spironolactone  25 mg Oral Daily   Continuous Infusions:  sodium chloride 250 mL (12/14/18 1722)   sodium chloride      Principal Problem:   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) Active  Problems:   Acute systolic (congestive) heart failure (HCC)   NSVT (nonsustained ventricular tachycardia) (HCC)   Pre-diabetes   GERD (gastroesophageal reflux disease)   Cough   Hypokalemia   Hyponatremia   Hypochloremia   Elevated ALT measurement   Leukocytosis   Elevated TSH   SOB (shortness of breath)   Aspiration pneumonia of right lower lobe (HCC)   Lung mass   LOS: 6 days   A & P    Acute Combined Systolic and Diastolic CHFExacerbation, improved Cardiomyopathy, Unclear Etiology: The patient presented with insidiously worsening SOB with orthopnea, no swelling, no fever, and no chest pain.Likely due to ischemic heart disease. Cardiology and advanced heart failure team have been consulted. The patient had been receiving IV lasix bid, but that has now been stopped due to worsening renal function. ACE has also been discontinued. It has been recommended that the patient be started on Entresto as outpatient after discharge if his blood pressure allows. Echocardiogram performed on 12/11/2018 showed LVEF 15-20%, severe global hypokinesis with moderately dilated  LV, grade 3 DD with elevated LV filling pressure, moderate RV hypokinesis, mild LAE, moderate MR, trivial AI, dilated IVC. Monitor volume status with strict I's and O's and daily weights and fluid restrict to 1500 mL's. Patient is -7,504.7 cc. He has been started on spironolactone12.5 mg PO daily and losartan 25 mg daily.   NSVT and frequent PVC's: Likely due to ischemia. Patient has been started on coreg bid. Continue to monitor on telemetry. Possibly for [facility] tomorrow if patient will agree. Continue tomonitor renal function and electrolytes during diuresis. Continue to Hold on ACE currently and start Entresto Likely when renal function improves; BB to be started and the patient will be started on Carvedilol 3.125 mg twice daily given his frequent PVCs and nonsustained V. Tach. Keep K>4.0 and Mg > 2.0. Sleep study is strongly  recommended as outpatient. Cardiology will quantify on telemetry and if it is greater than 10% they will need amiodarone to suppress. They are going to add digoxin for his heart failure but patient was given Ivbradine earlier.  CAD: Patient was ruled out for MI by EKG and enzyme criteria. He underwent Myoview stress test and CTA chest which suggested severe CAD with totally occluded LAD and high-grade ramus lesion. Also LLL nodule suggestive of lung CA. While nonspecific, this can be seen in the setting of diffuse endocardial reversible ischemia as can be seen with multivessel coronary artery disease. Additionally, there is a small area of relatively more severe reversible ischemia along the apically anterior and anteroseptal walls along the distribution of the left anterior descending coronary artery. Marked left ventricular dilation with severe global hypokinesis. Left ventricular ejection fraction 16%."  Dr. Milas Kocher spoke to the patient and his roommate today trying to persuade the patient to go forward with [facility] tomorrow. He has refussed so far due to concern for CVA as a result of study.  The patient has been empirically started on high dose statin and ASA 81 mg daily. The patient underwent LHC today that demonstrated:  Assessment: 1. 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 Well-compensated filling pressures with moderately reduced CO [facility] has been consulted to evaluate the patient for possible CABG.  Hyperglycemia in the setting of Pre-Diabetes: HbA1c was 5.9. FSBS for the last 24 hours have been 108 - 134. Monitor.   Pericardial Effusion, ruled out: Ruled out by echocardiogram after CXR raised the possibility of pericardial effusion.  GERD: The patient is receiving Pantoprazole 40 mg po Daily.  Cough: Improving. Continue benzonatate and guaifenesin.  AKI/Renal Insufficiency: Improved now with discontinuation of lasix. Avoid  Nephrotoxic Medications, Contrast Dyes, as well as Hypotension if possible. Monitor creatinine, electrolytes, and volume status.   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 : Pulmonology consulted. They have recommended CT guided biopsy with follow up with oncology pending result.  Infiltrate seen on CTA in the the right lobe: Patient was started on IV Unasyn. He has been given a flutter valve and incentive spirometry. CRP is <0.8 and Procalcitonin is 0.11. Antibiotics have been stopped. Monitor WBC.   Hyponatremia/hypochloremia: Resolved. Monitor.  Hypokalemia: 2.6 this am. Keep K>4 as per cardiology. Supplement and monitor.  Elevated LFT: Improved. Likely reactive and will continue to monitor and trend.  Leukocytosis: Improved. 8.2 this morning. Monitor. No signs of infection.   Elevated TSH; suspected euthyroid sick syndrome. TSH , T4 and FT3 should be rechecked after patient is completely recovered in 4-6 weeks.  Mitral  Regurgitation: Moderate per echocardiogram. Cardiology is recommending reassessment with an echocardiogram once cardiac medications are fully titrated in 3 months.  I have seen and examined this patient myself. I have spent 36 minutes in his evaluation and care.  DVT prophylaxis: Enoxaparin 40 mg sq q24h Code Status: FULL CODE Family Communication: No family present at bedside  Disposition Plan: Anticipating discharge home tomorrow pending cardiac clearance  Freemon Binford, DO [facility] Direct contact: see [email or web address]  7PM-7AM contact night coverage as above 12/17/2018, 3:20 PM  LOS: 4 days

## 2018-12-17 NOTE — Progress Notes (Signed)
CARDIAC REHAB PHASE I   Preop education completed with pt. Pt given IS, demonstrates 2500. Reviewed importance of walks and sternal precautions with pt. Pt given in-the-tube sheet and [facility] care guide. Encouraged continued IS use throughout the weekend, and ambulating in room as able. Will continue to follow.  [place], RN BSN 12/17/2018 2:17 PM

## 2018-12-17 NOTE — Progress Notes (Signed)
Advanced Heart Failure Rounding Note   Subjective:    Chest CT 8/17  with suggestion of severe CAD with totally occluded LAD and high-grade ramus lesion. Also LLL nodule suggestive of lung CA (versus infection)  Cath on 8/20 Confirmed occluded LAD and 99% lesion at ostium of D1. (0% proximal large Ramus. Filling pressures ok.   Denies CP or SOB. No orthopnea or PND.   Seen by Dr. Lucianne Lei Tirgt this am. Plan for CABG next tuesday   Objective:   Weight Range:  Vital Signs:   Temp:  [97.7 F (36.5 C)-98.9 F (37.2 C)] 97.7 F (36.5 C) (08/21 0641) Pulse Rate:  [56-92] 92 (08/21 0641) Resp:  [12-23] 18 (08/21 0641) BP: (87-120)/(61-96) 116/76 (08/21 0641) SpO2:  [93 %-100 %] 100 % (08/21 0641) Weight:  [88.9 kg] 88.9 kg (08/21 0300) Last BM Date: 12/14/18  Weight change: Filed Weights   12/15/18 0430 12/16/18 0423 12/17/18 0300  Weight: 86.3 kg 87.8 kg 88.9 kg    Intake/Output:   Intake/Output Summary (Last 24 hours) at 12/17/2018 0843 Last data filed at 12/17/2018 0600 Gross per 24 hour  Intake 1320 ml  Output 1625 ml  Net -305 ml     Physical Exam: General:  Sitting up in bedNo resp difficulty HEENT: normal Neck: supple. no JVD. Carotids 2+ bilat; no bruits. No lymphadenopathy or thryomegaly appreciated. Cor: PMI nondisplaced. Regular rate & rhythm. No rubs, gallops or murmurs. Lungs: clear Abdomen: soft, nontender, nondistended. No hepatosplenomegaly. No bruits or masses. Good bowel sounds. Extremities: no cyanosis, clubbing, rash, edema Neuro: alert & orientedx3, cranial nerves grossly intact. moves all 4 extremities w/o difficulty. Affect pleasant   Telemetry: NSR 80-90s with frequent polymorphic PVCs = Personally reviewed   Labs: Basic Metabolic Panel: Recent Labs  Lab 12/11/18 0511 12/12/18 0519 12/13/18 1237 12/14/18 0512 12/15/18 0513 12/16/18 0639 12/16/18 0909 12/16/18 0912 12/16/18 2129 12/17/18 0426  NA 137 133* 136 135 135 133* 138  137  141 132* 134*  K 4.3 3.4* 3.8 4.2 3.4* 3.7 3.5 3.4*  2.6* 4.2 4.2  CL 98 93* 101 99 98 100  --   --  101 105  CO2 29 27 25 26 23 23   --   --  22 21*  GLUCOSE 105* 117* 134* 108* 110* 99  --   --  108* 121*  BUN 18 21* 19 19 24* 20  --   --  22* 21*  CREATININE 1.32* 1.37* 1.26* 1.18 1.29* 1.09  --   --  1.09 1.12  CALCIUM 9.5 9.1 9.0 9.0 9.0 8.7*  --   --  8.7* 8.3*  MG 2.1 2.0 2.1 2.2 2.1  --   --   --   --   --   PHOS 4.2 4.6 3.3 3.8 4.6  --   --   --   --   --     Liver Function Tests: Recent Labs  Lab 12/11/18 0511 12/12/18 0519 12/13/18 1237 12/15/18 0513 12/16/18 2129 12/17/18 0426  AST 27 34 25 19  --  26  ALT 34 45* 34 27  --  29  ALKPHOS 73 79 78 75  --  65  BILITOT 0.8 1.2 1.1 1.1  --  0.8  PROT 7.9 7.6 7.1 7.3  --  6.7  ALBUMIN 3.7 3.7 3.6 3.5 3.4* 3.2*   No results for input(s): LIPASE, AMYLASE in the last 168 hours. No results for input(s): AMMONIA in the last 168 hours.  CBC: Recent Labs  Lab 12/11/18 0511 12/12/18 0519 12/13/18 1237 12/14/18 0512 12/15/18 0513 12/16/18 0639 12/16/18 0909 12/16/18 0912  WBC 10.5 11.4* 9.6 10.3 10.8* 8.2  --   --   NEUTROABS 6.0 6.9 6.1 6.2 6.9  --   --   --   HGB 16.4 16.0 15.8 16.0 15.5 14.1 14.6 14.6  12.6*  HCT 50.5 49.0 48.9 49.4 47.3 43.6 43.0 43.0  37.0*  MCV 91.8 90.6 91.4 92.2 89.8 91.8  --   --   PLT 228 238 243 247 231 232  --   --     Cardiac Enzymes: No results for input(s): CKTOTAL, CKMB, CKMBINDEX, TROPONINI in the last 168 hours.  BNP: BNP (last 3 results) Recent Labs    12/10/18 0248  BNP 1,200.8*    ProBNP (last 3 results) No results for input(s): PROBNP in the last 8760 hours.    Other results:  Imaging: Dg Chest 2 View  Result Date: 12/17/2018 CLINICAL DATA:  Chest pain EXAM: CHEST - 2 VIEW COMPARISON:  CT chest December 14, 2018 FINDINGS: Stable cardiac and mediastinal contours. Persistent patchy consolidation right lower lung. Persistent nodule within the left mid lung.  No pleural effusion or pneumothorax. Thoracic spine degenerative changes. IMPRESSION: Persistent patchy consolidation right lower lung as can be seen with pneumonia. Followup PA and lateral chest X-ray is recommended in 3-4 weeks following trial of antibiotic therapy to ensure resolution and exclude underlying malignancy. Persistent nodule within the left mid lung. Bronchogenic carcinoma not excluded. Recommend attention on previously recommended follow-up chest CT. Electronically Signed   By: Lovey Newcomer M.D.   On: 12/17/2018 08:02   Vas US Doppler Pre Cabg  Result Date: 12/16/2018 PREOPERATIVE VASCULAR EVALUATION  Indications:      Pre CABG. Risk Factors:     Coronary artery disease. Comparison Study: No prervious study available for comparison Performing Technologist: Birdena Crandall, [place]  Examination Guidelines: A complete evaluation includes B-mode imaging, spectral Doppler, color Doppler, and power Doppler as needed of all accessible portions of each vessel. Bilateral testing is considered an integral part of a complete examination. Limited examinations for reoccurring indications may be performed as noted.  Right Carotid Findings: +----------+--------+--------+--------+--------+----------------------------+           PSV cm/sEDV cm/sStenosisDescribeComments                     +----------+--------+--------+--------+--------+----------------------------+ CCA Prox  79      9                       mild intimal wall changes    +----------+--------+--------+--------+--------+----------------------------+ CCA Distal54      14                      mild intimal wall changes    +----------+--------+--------+--------+--------+----------------------------+ ICA Prox  67      18                      minimal intimal wall changes +----------+--------+--------+--------+--------+----------------------------+ ICA Mid   66      18                                                    +----------+--------+--------+--------+--------+----------------------------+ ICA Distal67      17                                                   +----------+--------+--------+--------+--------+----------------------------+  ECA       91      8                                                    +----------+--------+--------+--------+--------+----------------------------+ Portions of this table do not appear on this page. +----------+--------+-------+--------+------------+           PSV cm/sEDV cmsDescribeArm Pressure +----------+--------+-------+--------+------------+ Subclavian117                    104          +----------+--------+-------+--------+------------+ +---------+--------+--+--------+--+ VertebralPSV cm/s59EDV cm/s11 +---------+--------+--+--------+--+ Left Carotid Findings: +----------+--------+--------+--------+--------+----------------------------+           PSV cm/sEDV cm/sStenosisDescribeComments                     +----------+--------+--------+--------+--------+----------------------------+ CCA Prox  68      11                                                   +----------+--------+--------+--------+--------+----------------------------+ CCA Distal62      7                       mild intimal wall changes    +----------+--------+--------+--------+--------+----------------------------+ ICA Prox  45      15                      minimal intimal wall changes +----------+--------+--------+--------+--------+----------------------------+ ICA Mid   59      14                                                   +----------+--------+--------+--------+--------+----------------------------+ ICA Distal98      29                                                   +----------+--------+--------+--------+--------+----------------------------+ ECA       73      4                                                     +----------+--------+--------+--------+--------+----------------------------+ +----------+--------+--------+--------+------------+ SubclavianPSV cm/sEDV cm/sDescribeArm Pressure +----------+--------+--------+--------+------------+           85                      100          +----------+--------+--------+--------+------------+ +---------+--------+--+--------+-+ VertebralPSV cm/s51EDV cm/s5 +---------+--------+--+--------+-+  ABI Findings: +--------+------------------+-----+---------+--------+ Right   Rt Pressure (mmHg)IndexWaveform Comment  +--------+------------------+-----+---------+--------+ [id number]                    triphasic         +--------+------------------+-----+---------+--------+ PTA     111  1.07 triphasic         +--------+------------------+-----+---------+--------+ DP      145               1.39 triphasic         +--------+------------------+-----+---------+--------+ +--------+------------------+-----+---------+-------+ Left    Lt Pressure (mmHg)IndexWaveform Comment +--------+------------------+-----+---------+-------+ Brachial100                    triphasic        +--------+------------------+-----+---------+-------+ PTA     124               1.19 triphasic        +--------+------------------+-----+---------+-------+ DP      106               1.02 triphasic        +--------+------------------+-----+---------+-------+ +-------+---------------+----------------+ ABI/TBIToday's ABI/TBIPrevious ABI/TBI +-------+---------------+----------------+ Right  1.39                            +-------+---------------+----------------+ Left   1.19                            +-------+---------------+----------------+  Right Doppler Findings: +-----------+--------+-----+---------+--------+ Site       PressureIndexDoppler  Comments +-----------+--------+-----+---------+--------+ Brachial   104           triphasic         +-----------+--------+-----+---------+--------+ Radial                  triphasic         +-----------+--------+-----+---------+--------+ Ulnar                   triphasic         +-----------+--------+-----+---------+--------+ Palmar Arch                      normal   +-----------+--------+-----+---------+--------+  Left Doppler Findings: +-----------+--------+-----+---------+--------+ Site       PressureIndexDoppler  Comments +-----------+--------+-----+---------+--------+ Brachial   100          triphasic         +-----------+--------+-----+---------+--------+ Radial                  triphasic         +-----------+--------+-----+---------+--------+ Ulnar                   triphasic         +-----------+--------+-----+---------+--------+ Palmar Arch                      normal   +-----------+--------+-----+---------+--------+  Summary: Right Carotid: There is no evidence of stenosis in the right ICA. Left Carotid: There is no evidence of stenosis in the left ICA. Right ABI: Resting right ankle-brachial index indicates noncompressible right lower extremity arteries. Left ABI: Resting left ankle-brachial index is within normal range. No evidence of significant left lower extremity arterial disease. Right Upper Extremity: Doppler waveforms remain within normal limits with right radial compression. Doppler waveforms remain within normal limits with right ulnar compression. Left Upper Extremity: Doppler waveforms remain within normal limits with left radial compression. Doppler waveforms remain within normal limits with left ulnar compression.     Preliminary      Medications:     Scheduled Medications: . amiodarone  200 mg Oral BID  . aspirin EC  81 mg Oral Daily  . atorvastatin  80 mg  Oral q1800  . carvedilol  3.125 mg Oral BID  . doxycycline  100 mg Oral Q12H  . enoxaparin (LOVENOX) injection  40 mg Subcutaneous Q24H  . enoxaparin  (LOVENOX) injection  40 mg Subcutaneous Q24H  . guaiFENesin  1,200 mg Oral BID  . losartan  25 mg Oral Daily  . pantoprazole  40 mg Oral Daily  . sodium chloride flush  3 mL Intravenous Q12H  . sodium chloride flush  3 mL Intravenous Q12H  . sodium chloride flush  3 mL Intravenous Q12H  . spironolactone  12.5 mg Oral Daily    Infusions: . sodium chloride 250 mL (12/14/18 1722)  . sodium chloride      PRN Medications: sodium chloride, sodium chloride, acetaminophen, acetaminophen, benzonatate, guaiFENesin-dextromethorphan, metoprolol tartrate, nitroGLYCERIN, ondansetron (ZOFRAN) IV, ondansetron (ZOFRAN) IV, sodium chloride flush, sodium chloride flush   Assessment/Plan:   1) Acute systolic HF - EF 56-31% by echo - EF 16% by Myoview with possible TID and mild ischemia - CTA on 8/18 suggestion of severe CAD with totally occluded LAD and high-grade ramus lesion. Also LLL nodule suggestive of lung CA (vs infection) - Cath on 8/20 Confirmed occluded LAD and 99% lesion at ostium of D1. (0% proximal large Ramus. Filling pressures ok.  - Suspect Frequent PVCs may also be contributing to LV dysfunction  - Continue digoxin - Continue carvedilol, losartan. Increase spiro to 25. Titrate as possible. BP too low for Entresto - Seen by Dr. Lucianne Lei Tirgt this am. 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 Scheduled for CABG tuesday - continue ASA, statin  4) LLL nodule - I reviewed with Radiology personally - Suspect it may be CA but limited risk factors for this. May be infectious. Suggest re-imaging in 3 weeks - Now on doxy. - Will discuss plan with Dr. Prescott Gum  5) Lack of social support - Will need close f/u in HF Clinic with likely involvement of Paramedicine and SW  teams    Length of Stay: 6   Glori Bickers MD 12/17/2018, 8:43 AM  Advanced Heart Failure Team Pager [phone] (M-F; [place])  Please contact [facility] for night-coverage after hours (4p -7a ) and weekends on [email or web address]

## 2018-12-18 LAB — BASIC METABOLIC PANEL
Anion gap: 9 (ref 5–15)
BUN: 17 mg/dL (ref 6–20)
CO2: 21 mmol/L — ABNORMAL LOW (ref 22–32)
Calcium: 8.3 mg/dL — ABNORMAL LOW (ref 8.9–10.3)
Chloride: 101 mmol/L (ref 98–111)
Creatinine, Ser: 1.15 mg/dL (ref 0.61–1.24)
GFR calc Af Amer: >60 mL/min (ref >60)
GFR calc non Af Amer: >60 mL/min (ref >60)
Glucose, Bld: 104 mg/dL — ABNORMAL HIGH (ref 70–99)
Potassium: 4.2 mmol/L (ref 3.5–5.1)
Sodium: 131 mmol/L — ABNORMAL LOW (ref 135–145)

## 2018-12-18 LAB — T4, FREE: Free T4: 0.66 ng/dL (ref 0.61–1.12)

## 2018-12-18 LAB — TSH: TSH: 10.994 u[IU]/mL — ABNORMAL HIGH (ref 0.350–4.500)

## 2018-12-18 NOTE — Progress Notes (Signed)
PROGRESS NOTE  Jonathan King ZOX:[id number] DOB: 1965/06/19 DOA: 12/09/2018 PCP: Patient, No Pcp Per  Brief History   The patient is a 53 year old Caucasian obese male with no real appreciable past medical history except history of intermittent alcohol abuse who presents with a chief complaint of grossly worsening shortness of breath, nonproductive cough as well as abdominal discomfort and swelling. He states that he has had trouble laying flat at night and tosses and turns but denies any lower extremity swelling. He states that symptoms started about a month ago insidiously and have progressively worsened. Also noted to have some mild abdominal discomfort and nausea over the same interval but was recently prescribed Protonix and Zofran and states that this is alleviated his abdominal discomfort.   He presented to the ED and was found to be afebrile and saturating 90% on room air while at rest but was slightly tachypneic. Chest x-ray was notable for increased cardiac silhouette from prior and BNP was elevated at 1200.8. Of note SARS-CoV-2 testing was still pending. CHF of unknown type given his persistently worsening shortness of breath with orthopnea and elevated BNP along with increased cardiac silhouette. He was started on diuresis and echocardiogram ordered.  Echocardiogram was done and was severely abnormal and showed an EF of 15 to 20% with severe global hypokinesis with moderately dilated left ventricle and grade 3 diastolic dysfunction with elevated left filling pressures, moderate right ventricular hypokinesis as well as mild LAE, moderate Mitral Regurg and trivial AI and dilated IVC.   Cardiology was consulted and recommending changing diuresis because of worsening renal function and planning on cathing the patient on Monday to rule out ischemia. Unfortunately the patient is very averse to getting a cardiac cath so he will undergo a stress Myoview test and this was done on 12/14/2018  along with a CTA chest. It demonstrated severe CAD with totally occluded LAD and high-grade ramus lesion. Also LLL nodule suggestive of lung CA.  Dr. Milas Kocher has discussed this with the patient and his roommate. He is hopeful that the patient will change his mind and go for [facility] tomorrow.    IV Diuresis is now stopped because of worsening renal function and because the patient appeared dry so was changed to po this AM. Patient continues to have frequent PVCs including ventricular bigeminy and cardiology considering antiarrhythmics given that the low-dose beta-blocker is ineffective and because the patient is bradycardic.  Cardiology has also added spironolactone 12.5 mg p.o. daily as well as losartan 25 mg p.o. daily S Dr. Jens Som does not think the patient can tolerate Entresto at this point and recommending considering it as an outpatient.  They will consider a cardiac MRI after discharge and referral to the CHF clinic in a recommending a follow-up echocardiogram in 3 months.  After Myoview cardiac stress and CTA chest which suggested severe CAD, the patient finally agreed to [facility] on 12/16/2018. The study revealed: Assessment: 1. 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 Well-compensated filling pressures with moderately reduced CO  [facility] has been consulted to evaluate the patient for possible CABG. It has been planned for 12/21/2018.  Consultants   Cardiology  Pulmonary Critical Care  Cardiothoracic surgery  Procedures   Myoview stress test.  LHC  Antibiotics   Anti-infectives (From admission, onward)   Start     Dose/Rate Route Frequency Ordered Stop   12/16/18 1030  doxycycline (VIBRA-TABS) tablet 100 mg  100 mg Oral Every 12 hours 12/16/18 1028 12/23/18 0959   12/14/18 1700  Ampicillin-Sulbactam (UNASYN) 3 g in sodium chloride 0.9 % 100 mL IVPB  Status:  Discontinued     3 g 200 mL/hr over 30 Minutes Intravenous Every 8  hours 12/14/18 1654 12/15/18 1019        Subjective  The patient is resting comfortably. No new complaints. His bed has been raised as high as it can go. I feel this is unsafe and have asked nursing to lower it. She tells me that it is this high at the patient's request.  Objective   Vitals:  Vitals:   12/18/18 0858 12/18/18 1207  BP: 106/78 102/70  Pulse: 84 75  Resp:  16  Temp:  (!) 97.5 F (36.4 C)  SpO2: 95% 96%    Exam:  Constitutional:   The patient is awake, alert, and oriented x 3. No acute distress. Respiratory:   No increased work of breathing.  No wheezes, rales, or rhonchi.  No tactile fremitus. Cardiovascular:   Regular rate and rhythm  No murmurs, ectopy, or gallups.  No lateral PMI. No thrills. Abdomen:   Abdomen is soft, non-tender, non-distended.  No hernias, masses, or organomegaly  Normoactive bowel sounds.  Musculoskeletal:   No cyanosis, clubbing, or edema Skin:   No rashes, lesions, ulcers  palpation of skin: no induration or nodules Neurologic:   CN 2-12 intact  Sensation all 4 extremities intact Psychiatric:   Mental status o Mood, affect appropriate o Orientation to person, place, time   judgment and insight appear intact    I have personally reviewed the following:   Today's Data   Vitals, CMP, CBC  Imaging   CTA chest  Cardiology Data   Myoview stress test.  LHC  Scheduled Meds:  amiodarone  200 mg Oral BID   aspirin EC  81 mg Oral Daily   atorvastatin  80 mg Oral q1800   carvedilol  3.125 mg Oral BID   doxycycline  100 mg Oral Q12H   enoxaparin (LOVENOX) injection  40 mg Subcutaneous Q24H   guaiFENesin  1,200 mg Oral BID   losartan  25 mg Oral Daily   pantoprazole  40 mg Oral Daily   sodium chloride flush  3 mL Intravenous Q12H   sodium chloride flush  3 mL Intravenous Q12H   sodium chloride flush  3 mL Intravenous Q12H   spironolactone  25 mg Oral Daily   Continuous  Infusions:  sodium chloride 250 mL (12/14/18 1722)   sodium chloride      Principal Problem:   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) Active Problems:   Acute systolic (congestive) heart failure (HCC)   NSVT (nonsustained ventricular tachycardia) (HCC)   Pre-diabetes   GERD (gastroesophageal reflux disease)   Cough   Hypokalemia   Hyponatremia   Hypochloremia   Elevated ALT measurement   Leukocytosis   Elevated TSH   SOB (shortness of breath)   Aspiration pneumonia of right lower lobe (HCC)   Lung mass   LOS: 7 days   A & P    Acute Combined Systolic and Diastolic CHFExacerbation, improved Cardiomyopathy, Unclear Etiology: The patient presented with insidiously worsening SOB with orthopnea, no swelling, no fever, and no chest pain.Likely due to ischemic heart disease. Cardiology and advanced heart failure team have been consulted. The patient had been receiving IV lasix bid, but that has now been stopped due to worsening renal function. ACE has  also been discontinued. It has been recommended that the patient be started on Entresto as outpatient after discharge if his blood pressure allows. Echocardiogram performed on 12/11/2018 showed LVEF 15-20%, severe global hypokinesis with moderately dilated LV, grade 3 DD with elevated LV filling pressure, moderate RV hypokinesis, mild LAE, moderate MR, trivial AI, dilated IVC. Monitor volume status with strict I's and O's and daily weights and fluid restrict to 1500 mL's. Patient is -7,504.7 cc. He has been started on spironolactone12.5 mg PO daily and losartan 25 mg daily.   NSVT and frequent PVC's: Likely due to ischemia. Patient has been started on coreg bid. Continue to monitor on telemetry. Possibly for [facility] tomorrow if patient will agree. Continue tomonitor renal function and electrolytes during diuresis. Continue to Hold on ACE currently and start Entresto Likely when renal function improves; BB to be started  and the patient will be started on Carvedilol 3.125 mg twice daily given his frequent PVCs and nonsustained V. Tach. Keep K>4.0 and Mg > 2.0. Sleep study is strongly recommended as outpatient. Cardiology will quantify on telemetry and if it is greater than 10% they will need amiodarone to suppress. They are going to add digoxin for his heart failure but patient was given Ivbradine earlier.  CAD: Patient was ruled out for MI by EKG and enzyme criteria. He underwent Myoview stress test and CTA chest which suggested severe CAD with totally occluded LAD and high-grade ramus lesion. Also LLL nodule suggestive of lung CA. While nonspecific, this can be seen in the setting of diffuse endocardial reversible ischemia as can be seen with multivessel coronary artery disease. Additionally, there is a small area of relatively more severe reversible ischemia along the apically anterior and anteroseptal walls along the distribution of the left anterior descending coronary artery. Marked left ventricular dilation with severe global hypokinesis. Left ventricular ejection fraction 16%."  Dr. Milas Kocher spoke to the patient and his roommate today trying to persuade the patient to go forward with [facility] tomorrow. He has refussed so far due to concern for CVA as a result of study.  The patient has been empirically started on high dose statin and ASA 81 mg daily. The patient underwent LHC today that demonstrated:  Assessment: 1. 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 Well-compensated filling pressures with moderately reduced CO [facility] has been consulted to evaluate the patient for possible CABG. This has been scheduled for 12/21/2018.  Hyperglycemia in the setting of Pre-Diabetes: HbA1c was 5.9. FSBS for the last 24 hours have been 104 - 121. Monitor.   Pericardial Effusion, ruled out: Ruled out by echocardiogram after CXR raised the possibility of pericardial  effusion.  GERD: The patient is receiving Pantoprazole 40 mg po Daily.  Cough: Improving. Continue benzonatate and guaifenesin.  AKI/Renal Insufficiency: Improved now with discontinuation of lasix. Avoid Nephrotoxic Medications, Contrast Dyes, as well as Hypotension if possible. Monitor creatinine, electrolytes, and volume status.   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 : Pulmonology consulted. They have recommended CT guided biopsy with follow up with oncology pending result.  Infiltrate seen on CTA in the the right lobe: Patient was started on IV Unasyn. He has been given a flutter valve and incentive spirometry. CRP is <0.8 and Procalcitonin is 0.11. Antibiotics have been stopped. Monitor WBC.   Hyponatremia/hypochloremia: Resolved. Monitor.  Hypokalemia: 3.4 this am. Keep K>4 as per cardiology. Supplement and monitor.  Elevated LFT: Improved. Likely reactive and will  continue to monitor and trend.  Leukocytosis: Improved. 8.2 this morning. Monitor. No signs of infection.   Elevated TSH: suspected euthyroid sick syndrome. TSH rechecked and has elevated from 9.49 to 17.79. T4 when checked on 12/12/2018 was within normal limits at 132.  FT3 when checked on that same date was at the low end of normal at 0.64. Will recheck.  Mitral Regurgitation: Moderate per echocardiogram. Cardiology is recommending reassessment with an echocardiogram once cardiac medications are fully titrated in 3 months.  I have seen and examined this patient myself. I have spent 34 minutes in his evaluation and care.  DVT prophylaxis: Enoxaparin 40 mg sq q24h Code Status: FULL CODE Family Communication: No family present at bedside  Disposition Plan: Anticipating discharge home tomorrow pending cardiac clearance  Journee Kohen, DO [facility] Direct contact: see [email or web address]  7PM-7AM contact night coverage as above 12/18/2018, 2:50 PM  LOS: 4 days

## 2018-12-18 NOTE — Progress Notes (Signed)
Upon entering the room patients bed not in lowest position. When attempting to lower the bed pt refused. Educated patient on safety hazard of bed not being in lowest position. Pt still refused.

## 2018-12-18 NOTE — Progress Notes (Signed)
Advanced Heart Failure Rounding Note   Subjective:    Chest CT 8/17  with suggestion of severe CAD with totally occluded LAD and high-grade ramus lesion. Also LLL nodule suggestive of lung CA (versus infection)  Cath on 8/20 Confirmed occluded LAD and 99% lesion at ostium of D1. (0% proximal large Ramus. Filling pressures ok. Seen by Dr. Lucianne Lei Tirgt this am. Plan for CABG next tuesday  Feels well. No CP or SOB. No f/c. No productive cough.   Objective:   Weight Range:  Vital Signs:   Temp:  [97.6 F (36.4 C)-98.2 F (36.8 C)] 97.6 F (36.4 C) (08/22 0538) Pulse Rate:  [39-84] 84 (08/22 0858) Resp:  [18] 18 (08/22 0538) BP: (102-130)/(73-78) 106/78 (08/22 0858) SpO2:  [95 %-98 %] 95 % (08/22 0858) Weight:  [89 kg] 89 kg (08/22 0538) Last BM Date: 12/16/18  Weight change: Filed Weights   12/16/18 0423 12/17/18 0300 12/18/18 0538  Weight: 87.8 kg 88.9 kg 89 kg    Intake/Output:   Intake/Output Summary (Last 24 hours) at 12/18/2018 1022 Last data filed at 12/18/2018 0900 Gross per 24 hour  Intake 1200 ml  Output 1300 ml  Net -100 ml     Physical Exam: General:  Well appearing. No resp difficulty HEENT: normal Neck: supple. no JVD. Carotids 2+ bilat; no bruits. No lymphadenopathy or thryomegaly appreciated. Cor: PMI nondisplaced. Regular rate & rhythm. + ecyopy No rubs, gallops or murmurs. Lungs: clear Abdomen: soft, nontender, nondistended. No hepatosplenomegaly. No bruits or masses. Good bowel sounds. Extremities: no cyanosis, clubbing, rash, edema Neuro: alert & orientedx3, cranial nerves grossly intact. moves all 4 extremities w/o difficulty. Affect pleasant   Telemetry: NSR 80s with frequent polymorphic PVCs ~20/min  Personally reviewed   Labs: Basic Metabolic Panel: Recent Labs  Lab 12/12/18 0519 12/13/18 1237 12/14/18 0512 12/15/18 0513 12/16/18 4782  12/16/18 0912 12/16/18 2129 12/17/18 0426 12/17/18 1057 12/18/18 0303  NA 133* 136 135 135  133*   < > 137  141 132* 134* 134* 131*  K 3.4* 3.8 4.2 3.4* 3.7   < > 3.4*  2.6* 4.2 4.2 4.3 4.2  CL 93* 101 99 98 100  --   --  101 105 103 101  CO2 27 25 26 23 23   --   --  22 21* 24 21*  GLUCOSE 117* 134* 108* 110* 99  --   --  108* 121* 113* 104*  BUN 21* 19 19 24* 20  --   --  22* 21* 21* 17  CREATININE 1.37* 1.26* 1.18 1.29* 1.09  --   --  1.09 1.12 1.18 1.15  CALCIUM 9.1 9.0 9.0 9.0 8.7*  --   --  8.7* 8.3* 8.5* 8.3*  MG 2.0 2.1 2.2 2.1  --   --   --   --   --   --   --   PHOS 4.6 3.3 3.8 4.6  --   --   --   --   --   --   --    < > = values in this interval not displayed.    Liver Function Tests: Recent Labs  Lab 12/12/18 0519 12/13/18 1237 12/15/18 0513 12/16/18 2129 12/17/18 0426  AST 34 25 19  --  26  ALT 45* 34 27  --  29  ALKPHOS 79 78 75  --  65  BILITOT 1.2 1.1 1.1  --  0.8  PROT 7.6 7.1 7.3  --  6.7  ALBUMIN 3.7 3.6 3.5 3.4* 3.2*   No results for input(s): LIPASE, AMYLASE in the last 168 hours. No results for input(s): AMMONIA in the last 168 hours.  CBC: Recent Labs  Lab 12/12/18 0519 12/13/18 1237 12/14/18 0512 12/15/18 0513 12/16/18 0639 12/16/18 0909 12/16/18 0912 12/17/18 0821  WBC 11.4* 9.6 10.3 10.8* 8.2  --   --  8.2  NEUTROABS 6.9 6.1 6.2 6.9  --   --   --  4.5  HGB 16.0 15.8 16.0 15.5 14.1 14.6 14.6  12.6* 14.3  HCT 49.0 48.9 49.4 47.3 43.6 43.0 43.0  37.0* 45.1  MCV 90.6 91.4 92.2 89.8 91.8  --   --  92.6  PLT 238 243 247 231 232  --   --  204    Cardiac Enzymes: No results for input(s): CKTOTAL, CKMB, CKMBINDEX, TROPONINI in the last 168 hours.  BNP: BNP (last 3 results) Recent Labs    12/10/18 0248  BNP 1,200.8*    ProBNP (last 3 results) No results for input(s): PROBNP in the last 8760 hours.    Other results:  Imaging: Dg Chest 2 View  Result Date: 12/17/2018 CLINICAL DATA:  Chest pain EXAM: CHEST - 2 VIEW COMPARISON:  CT chest December 14, 2018 FINDINGS: Stable cardiac and mediastinal contours. Persistent patchy  consolidation right lower lung. Persistent nodule within the left mid lung. No pleural effusion or pneumothorax. Thoracic spine degenerative changes. IMPRESSION: Persistent patchy consolidation right lower lung as can be seen with pneumonia. Followup PA and lateral chest X-ray is recommended in 3-4 weeks following trial of antibiotic therapy to ensure resolution and exclude underlying malignancy. Persistent nodule within the left mid lung. Bronchogenic carcinoma not excluded. Recommend attention on previously recommended follow-up chest CT. Electronically Signed   By: Lovey Newcomer M.D.   On: 12/17/2018 08:02   Ct Chest W Contrast  Result Date: 12/17/2018 CLINICAL DATA:  Pulmonary nodule and airspace disease identified on coronary CTA. EXAM: CT CHEST WITH CONTRAST TECHNIQUE: Multidetector CT imaging of the chest was performed during intravenous contrast administration. CONTRAST:  53mL OMNIPAQUE IOHEXOL 300 MG/ML  SOLN COMPARISON:  CTA 12/14/2018 FINDINGS: Cardiovascular: No significant vascular findings. Normal heart size. No pericardial effusion. Mediastinum/Nodes: No axillary or supraclavicular adenopathy. Borderline enlarged RIGHT lower paratracheal lymph node measures 1.3 cm. Normal diameter prevascular node measures 0.7 cm. RIGHT hilar lymph node measures 1.6 cm. Lungs/Pleura: Within the medial RIGHT lower lobe again demonstrated a band of dense airspace disease with air bronchograms not changed in short interval. Within the LEFT lower lobe, rounded nodule in the peripheral aspect of the superior segment measures 2.2 cm also unchanged in short interval. Upper Abdomen: Limited view of the liver, kidneys, pancreas are unremarkable. Normal adrenal glands. Musculoskeletal: No aggressive osseous lesion. IMPRESSION: 1. LEFT lower lobe peripheral pulmonary nodule with differential including bronchogenic carcinoma versus focus of rounded pneumonia. 2. Dense airspace disease in the medial RIGHT lower lobe consistent  pneumonia versus aspiration pneumonitis. Malignancy is less favored. 3. RIGHT hilar adenopathy and mild mediastinal adenopathy is favored reactive. 4. Patient management consideration would include antibiotic therapy and short-term follow-up FDG PET scan to determine malignant potential of nodules and mediastinal nodes. Electronically Signed   By: Suzy Bouchard M.D.   On: 12/17/2018 11:57   Vas US Doppler Pre Cabg  Result Date: 12/17/2018 PREOPERATIVE VASCULAR EVALUATION  Indications:      Pre CABG. Risk Factors:     Coronary artery disease. Comparison Study: No prervious study available for comparison  Performing Technologist: Birdena Crandall, [place] [facility]  Examination Guidelines: A complete evaluation includes B-mode imaging, spectral Doppler, color Doppler, and power Doppler as needed of all accessible portions of each vessel. Bilateral testing is considered an integral part of a complete examination. Limited examinations for reoccurring indications may be performed as noted.  Right Carotid Findings: +----------+--------+--------+--------+--------+----------------------------+           PSV cm/sEDV cm/sStenosisDescribeComments                     +----------+--------+--------+--------+--------+----------------------------+ CCA Prox  79      9                       mild intimal wall changes    +----------+--------+--------+--------+--------+----------------------------+ CCA Distal54      14                      mild intimal wall changes    +----------+--------+--------+--------+--------+----------------------------+ ICA Prox  67      18                      minimal intimal wall changes +----------+--------+--------+--------+--------+----------------------------+ ICA Mid   66      18                                                   +----------+--------+--------+--------+--------+----------------------------+ ICA Distal67      17                                                    +----------+--------+--------+--------+--------+----------------------------+ ECA       91      8                                                    +----------+--------+--------+--------+--------+----------------------------+ Portions of this table do not appear on this page. +----------+--------+-------+--------+------------+           PSV cm/sEDV cmsDescribeArm Pressure +----------+--------+-------+--------+------------+ Subclavian117                    104          +----------+--------+-------+--------+------------+ +---------+--------+--+--------+--+ VertebralPSV cm/s59EDV cm/s11 +---------+--------+--+--------+--+ Left Carotid Findings: +----------+--------+--------+--------+--------+----------------------------+           PSV cm/sEDV cm/sStenosisDescribeComments                     +----------+--------+--------+--------+--------+----------------------------+ CCA Prox  68      11                                                   +----------+--------+--------+--------+--------+----------------------------+ CCA Distal62      7                       mild intimal wall changes    +----------+--------+--------+--------+--------+----------------------------+ ICA Prox  45      15  minimal intimal wall changes +----------+--------+--------+--------+--------+----------------------------+ ICA Mid   59      14                                                   +----------+--------+--------+--------+--------+----------------------------+ ICA Distal98      29                                                   +----------+--------+--------+--------+--------+----------------------------+ ECA       73      4                                                    +----------+--------+--------+--------+--------+----------------------------+ +----------+--------+--------+--------+------------+ SubclavianPSV cm/sEDV cm/sDescribeArm  Pressure +----------+--------+--------+--------+------------+           85                      100          +----------+--------+--------+--------+------------+ +---------+--------+--+--------+-+ VertebralPSV cm/s51EDV cm/s5 +---------+--------+--+--------+-+  ABI Findings: +--------+------------------+-----+---------+--------+ Right   Rt Pressure (mmHg)IndexWaveform Comment  +--------+------------------+-----+---------+--------+ [id number]                    triphasic         +--------+------------------+-----+---------+--------+ PTA     111               1.07 triphasic         +--------+------------------+-----+---------+--------+ DP      145               1.39 triphasic         +--------+------------------+-----+---------+--------+ +--------+------------------+-----+---------+-------+ Left    Lt Pressure (mmHg)IndexWaveform Comment +--------+------------------+-----+---------+-------+ Brachial100                    triphasic        +--------+------------------+-----+---------+-------+ PTA     124               1.19 triphasic        +--------+------------------+-----+---------+-------+ DP      106               1.02 triphasic        +--------+------------------+-----+---------+-------+ +-------+---------------+----------------+ ABI/TBIToday's ABI/TBIPrevious ABI/TBI +-------+---------------+----------------+ Right  1.39                            +-------+---------------+----------------+ Left   1.19                            +-------+---------------+----------------+  Right Doppler Findings: +-----------+--------+-----+---------+--------+ Site       PressureIndexDoppler  Comments +-----------+--------+-----+---------+--------+ Brachial   104          triphasic         +-----------+--------+-----+---------+--------+ Radial                  triphasic         +-----------+--------+-----+---------+--------+ Ulnar  triphasic         +-----------+--------+-----+---------+--------+ Palmar Arch                      normal   +-----------+--------+-----+---------+--------+  Left Doppler Findings: +-----------+--------+-----+---------+--------+ Site       PressureIndexDoppler  Comments +-----------+--------+-----+---------+--------+ Brachial   100          triphasic         +-----------+--------+-----+---------+--------+ Radial                  triphasic         +-----------+--------+-----+---------+--------+ Ulnar                   triphasic         +-----------+--------+-----+---------+--------+ Palmar Arch                      normal   +-----------+--------+-----+---------+--------+  Summary: Right Carotid: There is no evidence of stenosis in the right ICA. Left Carotid: There is no evidence of stenosis in the left ICA. Right ABI: Resting right ankle-brachial index indicates noncompressible right lower extremity arteries. Left ABI: Resting left ankle-brachial index is within normal range. No evidence of significant left lower extremity arterial disease. Right Upper Extremity: Doppler waveforms remain within normal limits with right radial compression. Doppler waveforms remain within normal limits with right ulnar compression. Left Upper Extremity: Doppler waveforms remain within normal limits with left radial compression. Doppler waveforms remain within normal limits with left ulnar compression.  Electronically signed by Monica Martinez MD on 12/17/2018 at 4:09:28 PM.    Final      Medications:     Scheduled Medications: . amiodarone  200 mg Oral BID  . aspirin EC  81 mg Oral Daily  . atorvastatin  80 mg Oral q1800  . carvedilol  3.125 mg Oral BID  . doxycycline  100 mg Oral Q12H  . enoxaparin (LOVENOX) injection  40 mg Subcutaneous Q24H  . guaiFENesin  1,200 mg Oral BID  . losartan  25 mg Oral Daily  . pantoprazole  40 mg Oral Daily  . sodium chloride flush  3 mL  Intravenous Q12H  . sodium chloride flush  3 mL Intravenous Q12H  . sodium chloride flush  3 mL Intravenous Q12H  . spironolactone  25 mg Oral Daily    Infusions: . sodium chloride 250 mL (12/14/18 1722)  . sodium chloride      PRN Medications: sodium chloride, sodium chloride, acetaminophen, acetaminophen, benzonatate, guaiFENesin-dextromethorphan, metoprolol tartrate, nitroGLYCERIN, ondansetron (ZOFRAN) IV, ondansetron (ZOFRAN) IV, sodium chloride flush, sodium chloride flush   Assessment/Plan:   1) Acute systolic HF - EF 02-54% by echo - EF 16% by Myoview with possible TID and mild ischemia - CTA on 8/18 suggestion of severe CAD with totally occluded LAD and high-grade ramus lesion. Also LLL nodule suggestive of lung CA (vs infection) - Cath on 8/20 Confirmed occluded LAD and 99% lesion at ostium of D1. (0% proximal large Ramus. Filling pressures ok.  - Suspect Frequent PVCs may also be contributing to LV dysfunction  - Continue digoxin - Continue carvedilol, losartan and spiro. Titrate as possible. BP too low for Entresto - Seen by Dr. Prescott Gum. 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 Scheduled for CABG tuesday - continue ASA, statin, b-blocker  4) LLL nodule - I reviewed with Radiology personally - Suspect it may be CA but limited risk factors for this. May be infectious. Suggest re-imaging in 3 weeks - Now on doxy. - I d/w Dr. Prescott Gum. He will attempt to palpate at time of CABG. Will proceed with re-imaging after CABG and then can have needle biopsy if needed  5) Lack of social support - Will need close f/u in HF Clinic with likely involvement of Paramedicine and SW teams    Length of Stay: 7   Glori Bickers MD 12/18/2018, 10:22  AM  Advanced Heart Failure Team Pager [phone] (M-F; 7a - 4p)  Please contact [facility] for night-coverage after hours (4p -7a ) and weekends on [email or web address]

## 2018-12-19 LAB — BASIC METABOLIC PANEL
Anion gap: 8 (ref 5–15)
BUN: 18 mg/dL (ref 6–20)
CO2: 24 mmol/L (ref 22–32)
Calcium: 8.9 mg/dL (ref 8.9–10.3)
Chloride: 102 mmol/L (ref 98–111)
Creatinine, Ser: 1.31 mg/dL — ABNORMAL HIGH (ref 0.61–1.24)
GFR calc Af Amer: >60 mL/min (ref >60)
GFR calc non Af Amer: >60 mL/min (ref >60)
Glucose, Bld: 105 mg/dL — ABNORMAL HIGH (ref 70–99)
Potassium: 4 mmol/L (ref 3.5–5.1)
Sodium: 134 mmol/L — ABNORMAL LOW (ref 135–145)

## 2018-12-19 LAB — CBC WITH DIFFERENTIAL/PLATELET
Abs Immature Granulocytes: 0.04 10*3/uL (ref 0.00–0.07)
Basophils Absolute: 0.1 10*3/uL (ref 0.0–0.1)
Basophils Relative: 1 %
Eosinophils Absolute: 0.3 10*3/uL (ref 0.0–0.5)
Eosinophils Relative: 4 %
HCT: 43.1 % (ref 39.0–52.0)
Hemoglobin: 13.8 g/dL (ref 13.0–17.0)
Immature Granulocytes: 1 %
Lymphocytes Relative: 26 %
Lymphs Abs: 2.3 10*3/uL (ref 0.7–4.0)
MCH: 29.2 pg (ref 26.0–34.0)
MCHC: 32 g/dL (ref 30.0–36.0)
MCV: 91.3 fL (ref 80.0–100.0)
Monocytes Absolute: 0.8 10*3/uL (ref 0.1–1.0)
Monocytes Relative: 9 %
Neutro Abs: 5.1 10*3/uL (ref 1.7–7.7)
Neutrophils Relative %: 59 %
Platelets: 213 10*3/uL (ref 150–400)
RBC: 4.72 MIL/uL (ref 4.22–5.81)
RDW: 12.8 % (ref 11.5–15.5)
WBC: 8.6 10*3/uL (ref 4.0–10.5)
nRBC: 0 % (ref 0.0–0.2)

## 2018-12-19 LAB — T3, FREE: T3, Free: 2.4 pg/mL (ref 2.0–4.4)

## 2018-12-19 MED ORDER — FUROSEMIDE 40 MG PO TABS
40.0000 mg | ORAL_TABLET | Freq: Every day | ORAL | Status: DC
  Administered 2018-12-19 – 2018-12-20 (×2): 40 mg via ORAL
  Filled 2018-12-19 (×2): qty 1

## 2018-12-19 NOTE — Plan of Care (Signed)
  Problem: Health Behavior/Discharge Planning: Goal: Ability to manage health-related needs will improve Outcome: Progressing   Problem: Clinical Measurements: Goal: Ability to maintain clinical measurements within normal limits will improve Outcome: Progressing   Problem: Safety: Goal: Ability to remain free from injury will improve Outcome: Progressing   

## 2018-12-19 NOTE — Progress Notes (Signed)
Advanced Heart Failure Rounding Note   Subjective:    Chest CT 8/17  with suggestion of severe CAD with totally occluded LAD and high-grade ramus lesion. Also LLL nodule suggestive of lung CA (versus infection)  Cath on 8/20 Confirmed occluded LAD and 99% lesion at ostium of D1. (0% proximal large Ramus. Filling pressures ok. Seen by Dr. Lucianne Lei Tirgt this am. Plan for CABG next tuesday  Feels ok. No CP or SOB. Weight stable.   Objective:   Weight Range:  Vital Signs:   Temp:  [97.5 F (36.4 C)-98 F (36.7 C)] 98 F (36.7 C) (08/23 0438) Pulse Rate:  [70-76] 75 (08/23 0857) Resp:  [16-18] 18 (08/23 0438) BP: (90-103)/(70-81) 102/81 (08/23 0857) SpO2:  [92 %-97 %] 94 % (08/23 0857) Weight:  [88 kg] 88 kg (08/23 0438) Last BM Date: 12/17/18  Weight change: Filed Weights   12/17/18 0300 12/18/18 0538 12/19/18 0438  Weight: 88.9 kg 89 kg 88 kg    Intake/Output:   Intake/Output Summary (Last 24 hours) at 12/19/2018 0939 Last data filed at 12/19/2018 0901 Gross per 24 hour  Intake 840 ml  Output 1280 ml  Net -440 ml     Physical Exam: General:  Well appearing. No resp difficulty HEENT: normal Neck: supple. no JVD. Carotids 2+ bilat; no bruits. No lymphadenopathy or thryomegaly appreciated. Cor: PMI nondisplaced. Regular rate & rhythm. +ectopy No rubs, gallops or murmurs. Lungs: clear Abdomen: soft, nontender, nondistended. No hepatosplenomegaly. No bruits or masses. Good bowel sounds. Extremities: no cyanosis, clubbing, rash, edema Neuro: alert & orientedx3, cranial nerves grossly intact. moves all 4 extremities w/o difficulty. Affect pleasant   Telemetry: NSR 70-80s with frequent polymorphic PVCs ~20/min  Personally reviewed   Labs: Basic Metabolic Panel: Recent Labs  Lab 12/13/18 1237 12/14/18 0512 12/15/18 0513  12/16/18 2129 12/17/18 0426 12/17/18 1057 12/18/18 0303 12/19/18 0555  NA 136 135 135   < > 132* 134* 134* 131* 134*  K 3.8 4.2 3.4*   < >  4.2 4.2 4.3 4.2 4.0  CL 101 99 98   < > 101 105 103 101 102  CO2 25 26 23    < > 22 21* 24 21* 24  GLUCOSE 134* 108* 110*   < > 108* 121* 113* 104* 105*  BUN 19 19 24*   < > 22* 21* 21* 17 18  CREATININE 1.26* 1.18 1.29*   < > 1.09 1.12 1.18 1.15 1.31*  CALCIUM 9.0 9.0 9.0   < > 8.7* 8.3* 8.5* 8.3* 8.9  MG 2.1 2.2 2.1  --   --   --   --   --   --   PHOS 3.3 3.8 4.6  --   --   --   --   --   --    < > = values in this interval not displayed.    Liver Function Tests: Recent Labs  Lab 12/13/18 1237 12/15/18 0513 12/16/18 2129 12/17/18 0426  AST 25 19  --  26  ALT 34 27  --  29  ALKPHOS 78 75  --  65  BILITOT 1.1 1.1  --  0.8  PROT 7.1 7.3  --  6.7  ALBUMIN 3.6 3.5 3.4* 3.2*   No results for input(s): LIPASE, AMYLASE in the last 168 hours. No results for input(s): AMMONIA in the last 168 hours.  CBC: Recent Labs  Lab 12/13/18 1237 12/14/18 2542 12/15/18 7062 12/16/18 3762 12/16/18 8315 12/16/18 0912 12/17/18 1761  12/19/18 0555  WBC 9.6 10.3 10.8* 8.2  --   --  8.2 8.6  NEUTROABS 6.1 6.2 6.9  --   --   --  4.5 5.1  HGB 15.8 16.0 15.5 14.1 14.6 14.6  12.6* 14.3 13.8  HCT 48.9 49.4 47.3 43.6 43.0 43.0  37.0* 45.1 43.1  MCV 91.4 92.2 89.8 91.8  --   --  92.6 91.3  PLT 243 247 231 232  --   --  204 213    Cardiac Enzymes: No results for input(s): CKTOTAL, CKMB, CKMBINDEX, TROPONINI in the last 168 hours.  BNP: BNP (last 3 results) Recent Labs    12/10/18 0248  BNP 1,200.8*    ProBNP (last 3 results) No results for input(s): PROBNP in the last 8760 hours.    Other results:  Imaging: Ct Chest W Contrast  Result Date: 12/17/2018 CLINICAL DATA:  Pulmonary nodule and airspace disease identified on coronary CTA. EXAM: CT CHEST WITH CONTRAST TECHNIQUE: Multidetector CT imaging of the chest was performed during intravenous contrast administration. CONTRAST:  77mL OMNIPAQUE IOHEXOL 300 MG/ML  SOLN COMPARISON:  CTA 12/14/2018 FINDINGS: Cardiovascular: No significant  vascular findings. Normal heart size. No pericardial effusion. Mediastinum/Nodes: No axillary or supraclavicular adenopathy. Borderline enlarged RIGHT lower paratracheal lymph node measures 1.3 cm. Normal diameter prevascular node measures 0.7 cm. RIGHT hilar lymph node measures 1.6 cm. Lungs/Pleura: Within the medial RIGHT lower lobe again demonstrated a band of dense airspace disease with air bronchograms not changed in short interval. Within the LEFT lower lobe, rounded nodule in the peripheral aspect of the superior segment measures 2.2 cm also unchanged in short interval. Upper Abdomen: Limited view of the liver, kidneys, pancreas are unremarkable. Normal adrenal glands. Musculoskeletal: No aggressive osseous lesion. IMPRESSION: 1. LEFT lower lobe peripheral pulmonary nodule with differential including bronchogenic carcinoma versus focus of rounded pneumonia. 2. Dense airspace disease in the medial RIGHT lower lobe consistent pneumonia versus aspiration pneumonitis. Malignancy is less favored. 3. RIGHT hilar adenopathy and mild mediastinal adenopathy is favored reactive. 4. Patient management consideration would include antibiotic therapy and short-term follow-up FDG PET scan to determine malignant potential of nodules and mediastinal nodes. Electronically Signed   By: Suzy Bouchard M.D.   On: 12/17/2018 11:57     Medications:     Scheduled Medications: . amiodarone  200 mg Oral BID  . aspirin EC  81 mg Oral Daily  . atorvastatin  80 mg Oral q1800  . carvedilol  3.125 mg Oral BID  . doxycycline  100 mg Oral Q12H  . enoxaparin (LOVENOX) injection  40 mg Subcutaneous Q24H  . guaiFENesin  1,200 mg Oral BID  . losartan  25 mg Oral Daily  . pantoprazole  40 mg Oral Daily  . sodium chloride flush  3 mL Intravenous Q12H  . sodium chloride flush  3 mL Intravenous Q12H  . sodium chloride flush  3 mL Intravenous Q12H  . spironolactone  25 mg Oral Daily    Infusions: . sodium chloride 250 mL  (12/14/18 1722)  . sodium chloride      PRN Medications: sodium chloride, sodium chloride, acetaminophen, acetaminophen, benzonatate, guaiFENesin-dextromethorphan, metoprolol tartrate, nitroGLYCERIN, ondansetron (ZOFRAN) IV, ondansetron (ZOFRAN) IV, sodium chloride flush, sodium chloride flush   Assessment/Plan:   1) Acute systolic HF - EF 06-30% by echo - EF 16% by Myoview with possible TID and mild ischemia - CTA on 8/18 suggestion of severe CAD with totally occluded LAD and high-grade ramus lesion. Also LLL nodule suggestive  of lung CA (vs infection) - Cath on 8/20 Confirmed occluded LAD and 99% lesion at ostium of D1. (0% proximal large Ramus. Filling pressures ok.  - Suspect Frequent PVCs may also be contributing to LV dysfunction  - Continue digoxin - Continue carvedilol, losartan and spiro. Titrate as possible. BP too low to titrate or change to [facility]. Will give one dose lasix.  - Seen by Dr. Prescott Gum. 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 Still with frequent PVCs - Keep K > 4.0 Mg > 2.0 - will need sleep study to investigate underlying cause  3) CAD - no s/s ischemia.  - plan as above. Scheduled for CABG tuesday - continue ASA, statin, b-blocker  4) LLL nodule - I reviewed with Radiology personally - Suspect it may be CA but limited risk factors for this. May be infectious. Suggest re-imaging in 3 weeks - Now on doxy. - I d/w Dr. Prescott Gum. He will attempt to palpate at time of CABG. Will proceed with re-imaging after CABG and then can have needle biopsy if needed  5) Lack of social support - Will need close f/u in HF Clinic with likely involvement of Paramedicine and SW teams    Length of Stay: 8   Glori Bickers MD 12/19/2018, 9:39 AM  Advanced Heart Failure Team Pager [phone] (M-F; 7a - 4p)  Please contact [facility] for  night-coverage after hours (4p -7a ) and weekends on [email or web address]

## 2018-12-19 NOTE — Progress Notes (Signed)
PROGRESS NOTE  Jonathan King:[id number] DOB: October 27, 1965 DOA: 12/09/2018 PCP: Patient, No Pcp Per  Brief History   The patient is a 53 year old Caucasian obese male with no real appreciable past medical history except history of intermittent alcohol abuse who presents with a chief complaint of grossly worsening shortness of breath, nonproductive cough as well as abdominal discomfort and swelling. He states that he has had trouble laying flat at night and tosses and turns but denies any lower extremity swelling. He states that symptoms started about a month ago insidiously and have progressively worsened. Also noted to have some mild abdominal discomfort and nausea over the same interval but was recently prescribed Protonix and Zofran and states that this is alleviated his abdominal discomfort.   He presented to the ED and was found to be afebrile and saturating 90% on room air while at rest but was slightly tachypneic. Chest x-ray was notable for increased cardiac silhouette from prior and BNP was elevated at 1200.8. Of note SARS-CoV-2 testing was still pending. CHF of unknown type given his persistently worsening shortness of breath with orthopnea and elevated BNP along with increased cardiac silhouette. He was started on diuresis and echocardiogram ordered.  Echocardiogram was done and was severely abnormal and showed an EF of 15 to 20% with severe global hypokinesis with moderately dilated left ventricle and grade 3 diastolic dysfunction with elevated left filling pressures, moderate right ventricular hypokinesis as well as mild LAE, moderate Mitral Regurg and trivial AI and dilated IVC.   Cardiology was consulted and recommending changing diuresis because of worsening renal function and planning on cathing the patient on Monday to rule out ischemia. Unfortunately the patient is very averse to getting a cardiac cath so he will undergo a stress Myoview test and this was done on 12/14/2018  along with a CTA chest. It demonstrated severe CAD with totally occluded LAD and high-grade ramus lesion. Also LLL nodule suggestive of lung CA.  Dr. Milas Kocher has discussed this with the patient and his roommate. He is hopeful that the patient will change his mind and go for [facility] tomorrow.    IV Diuresis is now stopped because of worsening renal function and because the patient appeared dry so was changed to po this AM. Patient continues to have frequent PVCs including ventricular bigeminy and cardiology considering antiarrhythmics given that the low-dose beta-blocker is ineffective and because the patient is bradycardic.  Cardiology has also added spironolactone 12.5 mg p.o. daily as well as losartan 25 mg p.o. daily S Dr. Jens Som does not think the patient can tolerate Entresto at this point and recommending considering it as an outpatient.  They will consider a cardiac MRI after discharge and referral to the CHF clinic in a recommending a follow-up echocardiogram in 3 months.  After Myoview cardiac stress and CTA chest which suggested severe CAD, the patient finally agreed to [facility] on 12/16/2018. The study revealed: Assessment: 1. 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 Well-compensated filling pressures with moderately reduced CO  [facility] has been consulted to evaluate the patient for possible CABG. It has been planned for 12/21/2018.  Consultants   Cardiology  Pulmonary Critical Care  Cardiothoracic surgery  Procedures   Myoview stress test.  LHC  Antibiotics   Anti-infectives (From admission, onward)   Start     Dose/Rate Route Frequency Ordered Stop   12/16/18 1030  doxycycline (VIBRA-TABS) tablet 100 mg  100 mg Oral Every 12 hours 12/16/18 1028 12/23/18 0959   12/14/18 1700  Ampicillin-Sulbactam (UNASYN) 3 g in sodium chloride 0.9 % 100 mL IVPB  Status:  Discontinued     3 g 200 mL/hr over 30 Minutes Intravenous Every 8  hours 12/14/18 1654 12/15/18 1019        Subjective  The patient is resting comfortably. He is in sinus rhythm, but has had multiple polymorphic PVC's.   Objective   Vitals:  Vitals:   12/19/18 0438 12/19/18 0857  BP: 103/79 102/81  Pulse: 70 75  Resp: 18   Temp: 98 F (36.7 C)   SpO2: 92% 94%    Exam:  Constitutional:   The patient is awake, alert, and oriented x 3. No acute distress. Respiratory:   No increased work of breathing.  No wheezes, rales, or rhonchi.  No tactile fremitus. Cardiovascular:   Regular rate and rhythm  No murmurs, ectopy, or gallups.  No lateral PMI. No thrills. Abdomen:   Abdomen is soft, non-tender, non-distended.  No hernias, masses, or organomegaly  Normoactive bowel sounds.  Musculoskeletal:   No cyanosis, clubbing, or edema Skin:   No rashes, lesions, ulcers  palpation of skin: no induration or nodules Neurologic:   CN 2-12 intact  Sensation all 4 extremities intact Psychiatric:   Mental status o Mood, affect appropriate o Orientation to person, place, time   judgment and insight appear intact    I have personally reviewed the following:   Today's Data   Vitals, CMP, CBC  Imaging   CTA chest  Cardiology Data   Myoview stress test.  LHC  Scheduled Meds:  amiodarone  200 mg Oral BID   aspirin EC  81 mg Oral Daily   atorvastatin  80 mg Oral q1800   carvedilol  3.125 mg Oral BID   doxycycline  100 mg Oral Q12H   enoxaparin (LOVENOX) injection  40 mg Subcutaneous Q24H   furosemide  40 mg Oral Daily   guaiFENesin  1,200 mg Oral BID   losartan  25 mg Oral Daily   pantoprazole  40 mg Oral Daily   sodium chloride flush  3 mL Intravenous Q12H   sodium chloride flush  3 mL Intravenous Q12H   sodium chloride flush  3 mL Intravenous Q12H   spironolactone  25 mg Oral Daily   Continuous Infusions:  sodium chloride 250 mL (12/14/18 1722)   sodium chloride      Principal Problem:    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) Active Problems:   Acute systolic (congestive) heart failure (HCC)   NSVT (nonsustained ventricular tachycardia) (HCC)   Pre-diabetes   GERD (gastroesophageal reflux disease)   Cough   Hypokalemia   Hyponatremia   Hypochloremia   Elevated ALT measurement   Leukocytosis   Elevated TSH   SOB (shortness of breath)   Aspiration pneumonia of right lower lobe (HCC)   Lung mass   LOS: 8 days   A & P    Acute Combined Systolic and Diastolic CHFExacerbation, improved Cardiomyopathy, Unclear Etiology: The patient presented with insidiously worsening SOB with orthopnea, no swelling, no fever, and no chest pain.Likely due to ischemic heart disease. Cardiology and advanced heart failure team have been consulted. The patient had been receiving IV lasix bid, but that has now been stopped due to worsening renal function. ACE has also been discontinued. It has been recommended that the patient be started on Entresto as outpatient after discharge if  his blood pressure allows. Echocardiogram performed on 12/11/2018 showed LVEF 15-20%, severe global hypokinesis with moderately dilated LV, grade 3 DD with elevated LV filling pressure, moderate RV hypokinesis, mild LAE, moderate MR, trivial AI, dilated IVC. Monitor volume status with strict I's and O's and daily weights and fluid restrict to 1500 mL's. Patient is -7,504.7 cc. He has been started on spironolactone12.5 mg PO daily and losartan 25 mg daily.   NSVT and frequent PVC's: Likely due to ischemia. Patient has been started on coreg bid. Continue to monitor on telemetry. Possibly for [facility] tomorrow if patient will agree. Continue tomonitor renal function and electrolytes during diuresis. Continue to Hold on ACE currently and start Entresto Likely when renal function improves; BB to be started and the patient will be started on Carvedilol 3.125 mg twice daily given his frequent PVCs and  nonsustained V. Tach. Keep K>4.0 and Mg > 2.0. Sleep study is strongly recommended as outpatient. Cardiology will quantify on telemetry and if it is greater than 10% they will need amiodarone to suppress. They are going to add digoxin for his heart failure but patient was given Ivbradine earlier. Continues to have multiple polymorphic PVC's. Monitor electrolytes and supplement as necessary.  CAD: Patient was ruled out for MI by EKG and enzyme criteria. He underwent Myoview stress test and CTA chest which suggested severe CAD with totally occluded LAD and high-grade ramus lesion. Also LLL nodule suggestive of lung CA. While nonspecific, this can be seen in the setting of diffuse endocardial reversible ischemia as can be seen with multivessel coronary artery disease. Additionally, there is a small area of relatively more severe reversible ischemia along the apically anterior and anteroseptal walls along the distribution of the left anterior descending coronary artery. Marked left ventricular dilation with severe global hypokinesis. Left ventricular ejection fraction 16%."  Dr. Milas Kocher spoke to the patient and his roommate today trying to persuade the patient to go forward with [facility] tomorrow. He has refussed so far due to concern for CVA as a result of study.  The patient has been empirically started on high dose statin and ASA 81 mg daily. The patient underwent LHC today that demonstrated:  Assessment: 1. 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 Well-compensated filling pressures with moderately reduced CO [facility] has been consulted to evaluate the patient for possible CABG. This has been scheduled for 12/21/2018.  Hyperglycemia in the setting of Pre-Diabetes: HbA1c was 5.9. FSBS for the last 24 hours have been 104 - 121. Monitor.   Pericardial Effusion, ruled out: Ruled out by echocardiogram after CXR raised the possibility of pericardial  effusion.  GERD: The patient is receiving Pantoprazole 40 mg po Daily.  Cough: Improving. Continue benzonatate and guaifenesin.  AKI/Renal Insufficiency: Improved now with discontinuation of lasix. Avoid Nephrotoxic Medications, Contrast Dyes, as well as Hypotension if possible. Monitor creatinine, electrolytes, and volume status.   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 : Pulmonology consulted. They have recommended CT guided biopsy with follow up with oncology pending result.  Infiltrate seen on CTA in the the right lobe: Patient was started on IV Unasyn. He has been given a flutter valve and incentive spirometry. CRP is <0.8 and Procalcitonin is 0.11. Antibiotics have been stopped. Monitor WBC.   Hyponatremia/hypochloremia: Resolved. Monitor.  Hypokalemia: 3.4 this am. Keep K>4 as per cardiology. Supplement and monitor.  Elevated LFT: Improved. Likely reactive and will continue to monitor and trend.  Leukocytosis:  Improved. 8.2 this morning. Monitor. No signs of infection.   Elevated TSH: suspected euthyroid sick syndrome. TSH rechecked and has elevated from 9.49 to 17.79. T4 when checked on 12/12/2018 was within normal limits at 132.  FT3 when checked on that same date was at the low end of normal at 0.64. Will recheck.  Mitral Regurgitation: Moderate per echocardiogram. Cardiology is recommending reassessment with an echocardiogram once cardiac medications are fully titrated in 3 months.  I have seen and examined this patient myself. I have spent 32 minutes in his evaluation and care.  DVT prophylaxis: Enoxaparin 40 mg sq q24h Code Status: FULL CODE Family Communication: No family present at bedside  Disposition Plan: Anticipating discharge home tomorrow pending cardiac clearance  Declynn Lopresti, DO [facility] Direct contact: see [email or web address]  7PM-7AM contact night coverage as above 12/18/2018, 2:50 PM  LOS: 4 days

## 2018-12-19 NOTE — Progress Notes (Signed)
Patient does not have bed in lowest position, RN educated patient on safety and he does not want the bed low.

## 2018-12-20 ENCOUNTER — Inpatient Hospital Stay (HOSPITAL_COMMUNITY): Payer: Self-pay

## 2018-12-20 ENCOUNTER — Encounter (HOSPITAL_COMMUNITY): Payer: Self-pay | Admitting: Certified Registered Nurse Anesthetist

## 2018-12-20 DIAGNOSIS — I2510 Atherosclerotic heart disease of native coronary artery without angina pectoris: Secondary | ICD-10-CM

## 2018-12-20 LAB — PULMONARY FUNCTION TEST
FEF 25-75 Post: 6.24 L/sec
FEF 25-75 Pre: 5.66 L/sec
FEF2575-%Change-Post: 10 %
FEF2575-%Pred-Post: 175 %
FEF2575-%Pred-Pre: 158 %
FEV1-%Change-Post: 6 %
FEV1-%Pred-Post: 89 %
FEV1-%Pred-Pre: 84 %
FEV1-Post: 3.7 L
FEV1-Pre: 3.48 L
FEV1FVC-%Change-Post: -1 %
FEV1FVC-%Pred-Pre: 125 %
FEV6-%Change-Post: 10 %
FEV6-%Pred-Post: 75 %
FEV6-%Pred-Pre: 67 %
FEV6-Post: 3.87 L
FEV6-Pre: 3.5 L
FEV6FVC-%Pred-Post: 103 %
FEV6FVC-%Pred-Pre: 103 %
FVC-%Change-Post: 8 %
FVC-%Pred-Post: 72 %
FVC-%Pred-Pre: 66 %
FVC-Post: 3.87 L
FVC-Pre: 3.57 L
Post FEV1/FVC ratio: 96 %
Post FEV6/FVC ratio: 100 %
Pre FEV1/FVC ratio: 97 %
Pre FEV6/FVC Ratio: 100 %

## 2018-12-20 LAB — PREPARE RBC (CROSSMATCH)

## 2018-12-20 MED ORDER — TRANEXAMIC ACID 1000 MG/10ML IV SOLN
1.5000 mg/kg/h | INTRAVENOUS | Status: AC
  Administered 2018-12-21: 1.5 mg/kg/h via INTRAVENOUS
  Filled 2018-12-20: qty 25

## 2018-12-20 MED ORDER — SODIUM CHLORIDE 0.9 % IV SOLN
2.0000 g | INTRAVENOUS | Status: DC
  Administered 2018-12-20: 2 g via INTRAVENOUS
  Filled 2018-12-20: qty 20
  Filled 2018-12-20: qty 2

## 2018-12-20 MED ORDER — METOPROLOL TARTRATE 12.5 MG HALF TABLET
12.5000 mg | ORAL_TABLET | Freq: Once | ORAL | Status: AC
  Administered 2018-12-21: 12.5 mg via ORAL
  Filled 2018-12-20: qty 1

## 2018-12-20 MED ORDER — BISACODYL 5 MG PO TBEC
5.0000 mg | DELAYED_RELEASE_TABLET | Freq: Once | ORAL | Status: AC
  Administered 2018-12-20: 5 mg via ORAL
  Filled 2018-12-20: qty 1

## 2018-12-20 MED ORDER — SODIUM CHLORIDE 0.9 % IV SOLN
1.5000 g | INTRAVENOUS | Status: AC
  Administered 2018-12-21: 1.5 g via INTRAVENOUS
  Administered 2018-12-21: .75 g via INTRAVENOUS
  Filled 2018-12-20: qty 1.5

## 2018-12-20 MED ORDER — TRANEXAMIC ACID (OHS) PUMP PRIME SOLUTION
2.0000 mg/kg | INTRAVENOUS | Status: DC
  Filled 2018-12-20: qty 1.74

## 2018-12-20 MED ORDER — INSULIN REGULAR(HUMAN) IN NACL 100-0.9 UT/100ML-% IV SOLN
INTRAVENOUS | Status: AC
  Administered 2018-12-21: 1 [IU]/h via INTRAVENOUS
  Filled 2018-12-20: qty 100

## 2018-12-20 MED ORDER — SODIUM CHLORIDE 0.9 % IV SOLN
INTRAVENOUS | Status: DC
  Filled 2018-12-20: qty 30

## 2018-12-20 MED ORDER — CHLORHEXIDINE GLUCONATE 4 % EX LIQD
60.0000 mL | Freq: Once | CUTANEOUS | Status: AC
  Administered 2018-12-20: 4 via TOPICAL
  Filled 2018-12-20: qty 60

## 2018-12-20 MED ORDER — CHLORHEXIDINE GLUCONATE 4 % EX LIQD
60.0000 mL | Freq: Once | CUTANEOUS | Status: AC
  Administered 2018-12-21: 4 via TOPICAL
  Filled 2018-12-20: qty 60

## 2018-12-20 MED ORDER — PLASMA-LYTE 148 IV SOLN
INTRAVENOUS | Status: AC
  Administered 2018-12-21: 500 mL
  Filled 2018-12-20: qty 2.5

## 2018-12-20 MED ORDER — SODIUM CHLORIDE 0.9 % IV SOLN
750.0000 mg | INTRAVENOUS | Status: DC
  Filled 2018-12-20: qty 750

## 2018-12-20 MED ORDER — NOREPINEPHRINE 4 MG/250ML-% IV SOLN
0.0000 ug/min | INTRAVENOUS | Status: DC
  Filled 2018-12-20: qty 250

## 2018-12-20 MED ORDER — CHLORHEXIDINE GLUCONATE 0.12 % MT SOLN
15.0000 mL | Freq: Once | OROMUCOSAL | Status: AC
  Administered 2018-12-21: 15 mL via OROMUCOSAL
  Filled 2018-12-20: qty 15

## 2018-12-20 MED ORDER — MAGNESIUM SULFATE 50 % IJ SOLN
40.0000 meq | INTRAMUSCULAR | Status: DC
  Filled 2018-12-20: qty 9.85

## 2018-12-20 MED ORDER — ALBUTEROL SULFATE (2.5 MG/3ML) 0.083% IN NEBU
2.5000 mg | INHALATION_SOLUTION | Freq: Once | RESPIRATORY_TRACT | Status: AC
  Administered 2018-12-20: 2.5 mg via RESPIRATORY_TRACT

## 2018-12-20 MED ORDER — POTASSIUM CHLORIDE 2 MEQ/ML IV SOLN
80.0000 meq | INTRAVENOUS | Status: DC
  Filled 2018-12-20: qty 40

## 2018-12-20 MED ORDER — TEMAZEPAM 15 MG PO CAPS: 15.0000 mg | ORAL_CAPSULE | Freq: Once | ORAL | Status: DC | PRN

## 2018-12-20 MED ORDER — DOPAMINE-DEXTROSE 3.2-5 MG/ML-% IV SOLN
0.0000 ug/kg/min | INTRAVENOUS | Status: DC
  Filled 2018-12-20: qty 250

## 2018-12-20 MED ORDER — PHENYLEPHRINE HCL-NACL 20-0.9 MG/250ML-% IV SOLN
30.0000 ug/min | INTRAVENOUS | Status: DC
  Filled 2018-12-20: qty 250

## 2018-12-20 MED ORDER — VANCOMYCIN HCL 10 G IV SOLR
1500.0000 mg | INTRAVENOUS | Status: AC
  Administered 2018-12-21: 1500 mg via INTRAVENOUS
  Filled 2018-12-20: qty 1500

## 2018-12-20 MED ORDER — MILRINONE LACTATE IN DEXTROSE 20-5 MG/100ML-% IV SOLN
0.3000 ug/kg/min | INTRAVENOUS | Status: DC
  Filled 2018-12-20: qty 100

## 2018-12-20 MED ORDER — TRANEXAMIC ACID (OHS) BOLUS VIA INFUSION
15.0000 mg/kg | INTRAVENOUS | Status: AC
  Administered 2018-12-21: 09:00:00 1302 mg via INTRAVENOUS
  Filled 2018-12-20: qty 1302

## 2018-12-20 MED ORDER — EPINEPHRINE HCL 5 MG/250ML IV SOLN IN NS
0.0000 ug/min | INTRAVENOUS | Status: DC
  Filled 2018-12-20: qty 250

## 2018-12-20 MED ORDER — DEXMEDETOMIDINE HCL IN NACL 400 MCG/100ML IV SOLN
0.1000 ug/kg/h | INTRAVENOUS | Status: AC
  Administered 2018-12-21: .4 ug/kg/h via INTRAVENOUS
  Filled 2018-12-20: qty 100

## 2018-12-20 MED ORDER — NITROGLYCERIN IN D5W 200-5 MCG/ML-% IV SOLN
2.0000 ug/min | INTRAVENOUS | Status: DC
  Filled 2018-12-20: qty 250

## 2018-12-20 NOTE — Progress Notes (Signed)
Patient refused to have groin shaved in prep for CABG.

## 2018-12-20 NOTE — Anesthesia Preprocedure Evaluation (Addendum)
Anesthesia Evaluation  Patient identified by MRN, date of birth, ID band Patient awake    Reviewed: Allergy & Precautions, NPO status , Patient's Chart, lab work & pertinent test results  Airway Mallampati: III  TM Distance: >3 FB Neck ROM: Full    Dental  (+) Chipped   Pulmonary neg pulmonary ROS,    Pulmonary exam normal breath sounds clear to auscultation       Cardiovascular + CAD and +CHF  Normal cardiovascular exam Rhythm:Regular Rate:Normal  CATH: 1. 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 Well-compensated filling pressures with moderately reduced CO RPDA lesion is 70% stenosed. Ost LAD to Prox LAD lesion is 99% stenosed. Prox LAD to Mid LAD lesion is 100% stenosed. 1st Diag lesion is 99% stenosed. Ramus lesion is 90% stenosed.   ECHO: 1. The left ventricle has a visually estimated ejection fraction of 15-20%. The cavity size was moderately dilated. Findings are consistent with dilated cardiomyopathy. Left ventricular diastolic Doppler parameters are consistent with restrictive  filling. Elevated left atrial and left ventricular end-diastolic pressures Left ventricular diffuse hypokinesis. 2. The right ventricle has moderately reduced systolic function. The cavity was normal. There is no increase in right ventricular wall thickness. 3. Left atrial size was mildly dilated. 4. The mitral valve is abnormal. Mild thickening of the mitral valve leaflet. Mitral valve regurgitation is moderate by color flow Doppler. 5. The tricuspid valve is grossly normal. 6. The aortic valve is abnormal. Mild thickening of the aortic valve. Aortic valve regurgitation is trivial by color flow Doppler. No stenosis of the aortic valve. 7. The aorta is normal unless otherwise noted. 8. The inferior vena cava was dilated in size with <50% respiratory variability.   Neuro/Psych negative  neurological ROS  negative psych ROS   GI/Hepatic Neg liver ROS, GERD  Medicated and Controlled,  Endo/Other  negative endocrine ROS  Renal/GU negative Renal ROS     Musculoskeletal Back pain   Abdominal   Peds  Hematology negative hematology ROS (+)   Anesthesia Other Findings CAD  Reproductive/Obstetrics                           Anesthesia Physical Anesthesia Plan  ASA: IV  Anesthesia Plan: General   Post-op Pain Management:    Induction: Intravenous  PONV Risk Score and Plan: 2 and Ondansetron, Dexamethasone, Midazolam and Treatment may vary due to age or medical condition  Airway Management Planned: Oral ETT  Additional Equipment: Arterial line, CVP, PA Cath, TEE and Ultrasound Guidance Line Placement  Intra-op Plan:   Post-operative Plan: Post-operative intubation/ventilation  Informed Consent: I have reviewed the patients History and Physical, chart, labs and discussed the procedure including the risks, benefits and alternatives for the proposed anesthesia with the patient or authorized representative who has indicated his/her understanding and acceptance.     Dental advisory given  Plan Discussed with: CRNA  Anesthesia Plan Comments:        Anesthesia Quick Evaluation

## 2018-12-20 NOTE — Progress Notes (Signed)
4 Days Post-Op Procedure(s) (LRB): RIGHT/LEFT HEART CATH AND CORONARY ANGIOGRAPHY (N/A) Subjective:  Ischemic cardiomyopathy with compensated cardiac output, 3 V CAD Stable without angina, symptoms CHF Plan CABG x3 in am  2 cm LLL nodule - will need bx  later Chronic R basilar lung disease , nonsmoker- ? Aspiration  Objective: Vital signs in last 24 hours: Temp:  [98 F (36.7 C)-98.2 F (36.8 C)] 98 F (36.7 C) (08/24 1131) Pulse Rate:  [71-77] 76 (08/24 1131) Cardiac Rhythm: Sinus bradycardia;Heart block (08/24 0701) Resp:  [20] 20 (08/24 1131) BP: (90-109)/(72-86) 90/75 (08/24 1131) SpO2:  [92 %-96 %] 96 % (08/24 1131) Weight:  [86.8 kg] 86.8 kg (08/24 0422)  Hemodynamic parameters for last 24 hours:  nsr  Intake/Output from previous day: 08/23 0701 - 08/24 0700 In: 960 [P.O.:960] Out: 2625 [Urine:2625] Intake/Output this shift: Total I/O In: 240 [P.O.:240] Out: -   Lungs clear, nsr, no murmrur Extremities warm Lab Results: Recent Labs    12/19/18 0555  WBC 8.6  HGB 13.8  HCT 43.1  PLT 213   BMET:  Recent Labs    12/18/18 0303 12/19/18 0555  NA 131* 134*  K 4.2 4.0  CL 101 102  CO2 21* 24  GLUCOSE 104* 105*  BUN 17 18  CREATININE 1.15 1.31*  CALCIUM 8.3* 8.9    PT/INR: No results for input(s): LABPROT, INR in the last 72 hours. ABG    Component Value Date/Time   PHART 7.451 (H) 12/17/2018 0435   HCO3 22.0 12/17/2018 0435   TCO2 22 12/16/2018 0912   TCO2 26 12/16/2018 0912   ACIDBASEDEF 1.5 12/17/2018 0435   O2SAT 95.2 12/17/2018 0435   CBG (last 3)  No results for input(s): GLUCAP in the last 72 hours.  Assessment/Plan: S/P Procedure(s) (LRB): RIGHT/LEFT HEART CATH AND CORONARY ANGIOGRAPHY (N/A) I have discussed CABG with patient including risks and benefits He may need postop mechanical support due to EF 20% He understands and agrees with CABG in am  LOS: 9 days    Jonathan King 12/20/2018

## 2018-12-20 NOTE — Progress Notes (Signed)
PROGRESS NOTE  Jonathan King QVZ:[id number] DOB: December 19, 1965 DOA: 12/09/2018 PCP: Patient, No Pcp Per  Brief History   The patient is a 53 year old Caucasian obese male with no real appreciable past medical history except history of intermittent alcohol abuse who presents with a chief complaint of grossly worsening shortness of breath, nonproductive cough as well as abdominal discomfort and swelling. He states that he has had trouble laying flat at night and tosses and turns but denies any lower extremity swelling. He states that symptoms started about a month ago insidiously and have progressively worsened. Also noted to have some mild abdominal discomfort and nausea over the same interval but was recently prescribed Protonix and Zofran and states that this is alleviated his abdominal discomfort.   He presented to the ED and was found to be afebrile and saturating 90% on room air while at rest but was slightly tachypneic. Chest x-ray was notable for increased cardiac silhouette from prior and BNP was elevated at 1200.8. Of note SARS-CoV-2 testing was still pending. CHF of unknown type given his persistently worsening shortness of breath with orthopnea and elevated BNP along with increased cardiac silhouette. He was started on diuresis and echocardiogram ordered.  Echocardiogram was done and was severely abnormal and showed an EF of 15 to 20% with severe global hypokinesis with moderately dilated left ventricle and grade 3 diastolic dysfunction with elevated left filling pressures, moderate right ventricular hypokinesis as well as mild LAE, moderate Mitral Regurg and trivial AI and dilated IVC.   Cardiology was consulted and recommending changing diuresis because of worsening renal function and planning on cathing the patient on Monday to rule out ischemia. Unfortunately the patient is very averse to getting a cardiac cath so he will undergo a stress Myoview test and this was done on 12/14/2018  along with a CTA chest. It demonstrated severe CAD with totally occluded LAD and high-grade ramus lesion. Also LLL nodule suggestive of lung CA.  Dr. Milas Kocher has discussed this with the patient and his roommate. He is hopeful that the patient will change his mind and go for [facility] tomorrow.    IV Diuresis is now stopped because of worsening renal function and because the patient appeared dry so was changed to po this AM. Patient continues to have frequent PVCs including ventricular bigeminy and cardiology considering antiarrhythmics given that the low-dose beta-blocker is ineffective and because the patient is bradycardic.  Cardiology has also added spironolactone 12.5 mg p.o. daily as well as losartan 25 mg p.o. daily S Dr. Jens Som does not think the patient can tolerate Entresto at this point and recommending considering it as an outpatient.  They will consider a cardiac MRI after discharge and referral to the CHF clinic in a recommending a follow-up echocardiogram in 3 months.  After Myoview cardiac stress and CTA chest which suggested severe CAD, the patient finally agreed to [facility] on 12/16/2018. The study revealed: Assessment: 1. 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 Well-compensated filling pressures with moderately reduced CO  [facility] has been consulted to evaluate the patient for possible CABG. It has been planned for 12/21/2018.  Consultants  . Cardiology . Pulmonary Critical Care . Cardiothoracic surgery  Procedures  . Myoview stress test. . LHC  Antibiotics   Anti-infectives (From admission, onward)   Start     Dose/Rate Route Frequency Ordered Stop   12/21/18 0400  vancomycin (VANCOCIN) 1,500 mg in sodium chloride 0.9 %  250 mL IVPB     1,500 mg 125 mL/hr over 120 Minutes Intravenous To Surgery 12/20/18 0951 12/22/18 0400   12/21/18 0400  cefUROXime (ZINACEF) 1.5 g in sodium chloride 0.9 % 100 mL IVPB     1.5 g 200 mL/hr  over 30 Minutes Intravenous To Surgery 12/20/18 0951 12/22/18 0400   12/21/18 0400  cefUROXime (ZINACEF) 750 mg in sodium chloride 0.9 % 100 mL IVPB     750 mg 200 mL/hr over 30 Minutes Intravenous To Surgery 12/20/18 0951 12/22/18 0400   12/20/18 0800  cefTRIAXone (ROCEPHIN) 2 g in sodium chloride 0.9 % 100 mL IVPB     2 g 200 mL/hr over 30 Minutes Intravenous Every 24 hours 12/20/18 0750     12/16/18 1030  doxycycline (VIBRA-TABS) tablet 100 mg     100 mg Oral Every 12 hours 12/16/18 1028 12/23/18 0959   12/14/18 1700  Ampicillin-Sulbactam (UNASYN) 3 g in sodium chloride 0.9 % 100 mL IVPB  Status:  Discontinued     3 g 200 mL/hr over 30 Minutes Intravenous Every 8 hours 12/14/18 1654 12/15/18 1019     .   Subjective  The patient is resting comfortably. He is in sinus rhythm, but has had multiple polymorphic PVC's.   Objective   Vitals:  Vitals:   12/20/18 0938 12/20/18 1131  BP: 109/86 90/75  Pulse:  76  Resp:  20  Temp:  98 F (36.7 C)  SpO2:  96%    Exam:  Constitutional:  . The patient is awake, alert, and oriented x 3. No acute distress. Respiratory:  . No increased work of breathing. . No wheezes, rales, or rhonchi. . No tactile fremitus. Cardiovascular:  . Regular rate and rhythm . No murmurs, ectopy, or gallups. . No lateral PMI. No thrills. Abdomen:  . Abdomen is soft, non-tender, non-distended. . No hernias, masses, or organomegaly . Normoactive bowel sounds.  Musculoskeletal:  . No cyanosis, clubbing, or edema Skin:  . No rashes, lesions, ulcers . palpation of skin: no induration or nodules Neurologic:  . CN 2-12 intact . Sensation all 4 extremities intact Psychiatric:  . Mental status o Mood, affect appropriate o Orientation to person, place, time  . judgment and insight appear intact    I have personally reviewed the following:   Today's Data  . Vitals, CMP, CBC  Imaging  . CTA chest  Cardiology Data  . Myoview stress test. . LHC   Scheduled Meds: . amiodarone  200 mg Oral BID  . aspirin EC  81 mg Oral Daily  . atorvastatin  80 mg Oral q1800  . carvedilol  3.125 mg Oral BID  . chlorhexidine  60 mL Topical Once   And  . [START ON 12/21/2018] chlorhexidine  60 mL Topical Once  . [START ON 12/21/2018] chlorhexidine  15 mL Mouth/Throat Once  . doxycycline  100 mg Oral Q12H  . enoxaparin (LOVENOX) injection  40 mg Subcutaneous Q24H  . [START ON 12/21/2018] epinephrine  0-10 mcg/min Intravenous To OR  . furosemide  40 mg Oral Daily  . guaiFENesin  1,200 mg Oral BID  . [START ON 12/21/2018] heparin-papaverine-plasmalyte irrigation   Irrigation To OR  . [START ON 12/21/2018] insulin   Intravenous To OR  . losartan  25 mg Oral Daily  . [START ON 12/21/2018] magnesium sulfate  40 mEq Other To OR  . [START ON 12/21/2018] metoprolol tartrate  12.5 mg Oral Once  . pantoprazole  40 mg Oral Daily  . [  START ON 12/21/2018] phenylephrine  30-200 mcg/min Intravenous To OR  . [START ON 12/21/2018] potassium chloride  80 mEq Other To OR  . sodium chloride flush  3 mL Intravenous Q12H  . sodium chloride flush  3 mL Intravenous Q12H  . sodium chloride flush  3 mL Intravenous Q12H  . spironolactone  25 mg Oral Daily  . [START ON 12/21/2018] tranexamic acid  15 mg/kg Intravenous To OR  . [START ON 12/21/2018] tranexamic acid  2 mg/kg Intracatheter To OR   Continuous Infusions: . sodium chloride 250 mL (12/14/18 1722)  . sodium chloride    . cefTRIAXone (ROCEPHIN)  IV 2 g (12/20/18 0935)  . [START ON 12/21/2018] cefUROXime (ZINACEF)  IV    . [START ON 12/21/2018] cefUROXime (ZINACEF)  IV    . [START ON 12/21/2018] dexmedetomidine    . [START ON 12/21/2018] DOPamine    . [START ON 12/21/2018] heparin 30,000 units/NS 1000 mL solution for CELLSAVER    . [START ON 12/21/2018] milrinone    . [START ON 12/21/2018] nitroGLYCERIN    . [START ON 12/21/2018] norepinephrine    . [START ON 12/21/2018] tranexamic acid (CYKLOKAPRON) infusion ([facility])    . 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) Active Problems:   Acute systolic (congestive) heart failure (HCC)   NSVT (nonsustained ventricular tachycardia) (HCC)   Pre-diabetes   GERD (gastroesophageal reflux disease)   Cough   Hypokalemia   Hyponatremia   Hypochloremia   Elevated ALT measurement   Leukocytosis   Elevated TSH   SOB (shortness of breath)   Aspiration pneumonia of right lower lobe (HCC)   Lung mass   LOS: 9 days   A & P    Acute Combined Systolic and Diastolic CHFExacerbation, improved Cardiomyopathy, Unclear Etiology: The patient presented with insidiously worsening SOB with orthopnea, no swelling, no fever, and no chest pain.Likely due to ischemic heart disease. Cardiology and advanced heart failure team have been consulted. The patient had been receiving IV lasix bid, but that has now been stopped due to worsening renal function. ACE has also been discontinued. It has been recommended that the patient be started on Entresto as outpatient after discharge if his blood pressure allows. Echocardiogram performed on 12/11/2018 showed LVEF 15-20%, severe global hypokinesis with moderately dilated LV, grade 3 DD with elevated LV filling pressure, moderate RV hypokinesis, mild LAE, moderate MR, trivial AI, dilated IVC. Monitor volume status with strict I's and O's and daily weights and fluid restrict to 1500 mL's. Patient is -7,504.7 cc. He has been started on spironolactone12.5 mg PO daily and losartan 25 mg daily.   NSVT and frequent PVC's: Likely due to ischemia. Patient has been started on coreg bid. Continue to monitor on telemetry. Possibly for [facility] tomorrow if patient will agree. Continue tomonitor renal function and electrolytes during diuresis. Continue to Hold on ACE currently and start Entresto Likely when renal function improves; BB to be started and the patient will be started on Carvedilol  3.125 mg twice daily given his frequent PVCs and nonsustained V. Tach. Keep K>4.0 and Mg > 2.0. Sleep study is strongly recommended as outpatient. Cardiology will quantify on telemetry and if it is greater than 10% they will need amiodarone to suppress. They are going to add digoxin for his heart failure but patient was given Ivbradine earlier. Continues to have multiple polymorphic PVC's. Monitor electrolytes and supplement as necessary.  CAD: Patient was ruled out for MI by EKG and enzyme criteria. He underwent Myoview stress test and CTA chest which suggested severe CAD with totally occluded LAD and high-grade ramus lesion. Also LLL nodule suggestive of lung CA. While nonspecific, this can be seen in the setting of diffuse endocardial reversible ischemia as can be seen with multivessel coronary artery disease. Additionally, there is a small area of relatively more severe reversible ischemia along the apically anterior and anteroseptal walls along the distribution of the left anterior descending coronary artery. Marked left ventricular dilation with severe global hypokinesis. Left ventricular ejection fraction 16%."  Dr. Milas Kocher spoke to the patient and his roommate today trying to persuade the patient to go forward with [facility] tomorrow. He has refussed so far due to concern for CVA as a result of study.  The patient has been empirically started on high dose statin and ASA 81 mg daily. The patient underwent LHC today that demonstrated:  Assessment: 1. 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 Well-compensated filling pressures with moderately reduced CO [facility] has been consulted to evaluate the patient for possible CABG. This has been scheduled for 12/21/2018.  Hyperglycemia in the setting of Pre-Diabetes: HbA1c was 5.9. FSBS for the last 24 hours have been 104 - 121. Monitor.   Pericardial Effusion, ruled out: Ruled out by echocardiogram after CXR  raised the possibility of pericardial effusion.  GERD: The patient is receiving Pantoprazole 40 mg po Daily.  Cough: Improving. Continue benzonatate and guaifenesin.  AKI/Renal Insufficiency: Improved now with discontinuation of lasix. Avoid Nephrotoxic Medications, Contrast Dyes, as well as Hypotension if possible. Monitor creatinine, electrolytes, and volume status.   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 : Pulmonology consulted. They have recommended CT guided biopsy with follow up with oncology pending result.  Infiltrate seen on CTA in the the right lobe: Patient was started on IV Unasyn. He has been given a flutter valve and incentive spirometry. CRP is <0.8 and Procalcitonin is 0.11. Antibiotics have been stopped. Monitor WBC.   Hyponatremia/hypochloremia: Resolved. Monitor.  Hypokalemia: 3.4 this am. Keep K>4 as per cardiology. Supplement and monitor.  Elevated LFT: Improved. Likely reactive and will continue to monitor and trend.  Leukocytosis: Improved. 8.2 this morning. Monitor. No signs of infection.   Elevated TSH: suspected euthyroid sick syndrome. TSH rechecked and has elevated from 9.49 to 17.79. T4 when checked on 12/12/2018 was within normal limits at 132.  FT3 when checked on that same date was at the low end of normal at 0.64. Will recheck.  Mitral Regurgitation: Moderate per echocardiogram. Cardiology is recommending reassessment with an echocardiogram once cardiac medications are fully titrated in 3 months.  I have seen and examined this patient myself. I have spent 30 minutes in his evaluation and care.  DVT prophylaxis: Enoxaparin 40 mg sq q24h Code Status: FULL CODE Family Communication: No family present at bedside  Disposition Plan: Anticipating discharge home tomorrow pending cardiac clearance  Taurus Alamo, DO [facility] Direct contact: see [email or web address]  7PM-7AM contact night coverage as above 12/18/2018, 2:50 PM  LOS: 4 days

## 2018-12-20 NOTE — Progress Notes (Signed)
Advanced Heart Failure Rounding Note   Subjective:    Chest CT 8/17  with suggestion of severe CAD with totally occluded LAD and high-grade ramus lesion. Also LLL nodule suggestive of lung CA (versus infection)  Cath on 8/20 Confirmed occluded LAD and 99% lesion at ostium of D1. (0% proximal large Ramus. Filling pressures ok. Seen by Dr. Lucianne Lei Tirgt this am. Plan for CABG next tuesday  Denies CP or SOB. Anxious about surgery.   Objective:   Weight Range:  Vital Signs:   Temp:  [98.1 F (36.7 C)-98.2 F (36.8 C)] 98.2 F (36.8 C) (08/24 0423) Pulse Rate:  [71-77] 71 (08/24 0423) Resp:  [20] 20 (08/24 0423) BP: (93-102)/(72-81) 93/76 (08/24 0423) SpO2:  [92 %-94 %] 92 % (08/24 0423) Weight:  [86.8 kg] 86.8 kg (08/24 0422) Last BM Date: 12/17/18  Weight change: Filed Weights   12/18/18 0538 12/19/18 0438 12/20/18 0422  Weight: 89 kg 88 kg 86.8 kg    Intake/Output:   Intake/Output Summary (Last 24 hours) at 12/20/2018 0852 Last data filed at 12/20/2018 0850 Gross per 24 hour  Intake 1200 ml  Output 2625 ml  Net -1425 ml     General:  Well appearing. No resp difficulty HEENT: normal Neck: supple. no JVD. Carotids 2+ bilat; no bruits. No lymphadenopathy or thryomegaly appreciated. Cor: PMI nondisplaced. Regular rate & rhythm. + ectopy  No rubs, gallops or murmurs. Lungs: clear Abdomen: soft, nontender, nondistended. No hepatosplenomegaly. No bruits or masses. Good bowel sounds. Extremities: no cyanosis, clubbing, rash, edema Neuro: alert & orientedx3, cranial nerves grossly intact. moves all 4 extremities w/o difficulty. Affect pleasant    Telemetry: NSR 70s with frequent polymorphic PVCs  Personally reviewed   Labs: Basic Metabolic Panel: Recent Labs  Lab 12/13/18 1237 12/14/18 0512 12/15/18 0513  12/16/18 2129 12/17/18 0426 12/17/18 1057 12/18/18 0303 12/19/18 0555  NA 136 135 135   < > 132* 134* 134* 131* 134*  K 3.8 4.2 3.4*   < > 4.2 4.2 4.3 4.2  4.0  CL 101 99 98   < > 101 105 103 101 102  CO2 25 26 23    < > 22 21* 24 21* 24  GLUCOSE 134* 108* 110*   < > 108* 121* 113* 104* 105*  BUN 19 19 24*   < > 22* 21* 21* 17 18  CREATININE 1.26* 1.18 1.29*   < > 1.09 1.12 1.18 1.15 1.31*  CALCIUM 9.0 9.0 9.0   < > 8.7* 8.3* 8.5* 8.3* 8.9  MG 2.1 2.2 2.1  --   --   --   --   --   --   PHOS 3.3 3.8 4.6  --   --   --   --   --   --    < > = values in this interval not displayed.    Liver Function Tests: Recent Labs  Lab 12/13/18 1237 12/15/18 0513 12/16/18 2129 12/17/18 0426  AST 25 19  --  26  ALT 34 27  --  29  ALKPHOS 78 75  --  65  BILITOT 1.1 1.1  --  0.8  PROT 7.1 7.3  --  6.7  ALBUMIN 3.6 3.5 3.4* 3.2*   No results for input(s): LIPASE, AMYLASE in the last 168 hours. No results for input(s): AMMONIA in the last 168 hours.  CBC: Recent Labs  Lab 12/13/18 1237 12/14/18 8127 12/15/18 5170 12/16/18 0174 12/16/18 9449 12/16/18 0912 12/17/18 6759 12/19/18  0555  WBC 9.6 10.3 10.8* 8.2  --   --  8.2 8.6  NEUTROABS 6.1 6.2 6.9  --   --   --  4.5 5.1  HGB 15.8 16.0 15.5 14.1 14.6 14.6  12.6* 14.3 13.8  HCT 48.9 49.4 47.3 43.6 43.0 43.0  37.0* 45.1 43.1  MCV 91.4 92.2 89.8 91.8  --   --  92.6 91.3  PLT 243 247 231 232  --   --  204 213    Cardiac Enzymes: No results for input(s): CKTOTAL, CKMB, CKMBINDEX, TROPONINI in the last 168 hours.  BNP: BNP (last 3 results) Recent Labs    12/10/18 0248  BNP 1,200.8*    ProBNP (last 3 results) No results for input(s): PROBNP in the last 8760 hours.    Other results:  Imaging: No results found.   Medications:     Scheduled Medications: . amiodarone  200 mg Oral BID  . aspirin EC  81 mg Oral Daily  . atorvastatin  80 mg Oral q1800  . bisacodyl  5 mg Oral Once  . carvedilol  3.125 mg Oral BID  . chlorhexidine  60 mL Topical Once   And  . [START ON 12/21/2018] chlorhexidine  60 mL Topical Once  . [START ON 12/21/2018] chlorhexidine  15 mL Mouth/Throat Once   . doxycycline  100 mg Oral Q12H  . enoxaparin (LOVENOX) injection  40 mg Subcutaneous Q24H  . furosemide  40 mg Oral Daily  . guaiFENesin  1,200 mg Oral BID  . losartan  25 mg Oral Daily  . [START ON 12/21/2018] metoprolol tartrate  12.5 mg Oral Once  . pantoprazole  40 mg Oral Daily  . sodium chloride flush  3 mL Intravenous Q12H  . sodium chloride flush  3 mL Intravenous Q12H  . sodium chloride flush  3 mL Intravenous Q12H  . spironolactone  25 mg Oral Daily    Infusions: . sodium chloride 250 mL (12/14/18 1722)  . sodium chloride    . cefTRIAXone (ROCEPHIN)  IV      PRN Medications: sodium chloride, sodium chloride, acetaminophen, acetaminophen, benzonatate, guaiFENesin-dextromethorphan, metoprolol tartrate, nitroGLYCERIN, ondansetron (ZOFRAN) IV, ondansetron (ZOFRAN) IV, sodium chloride flush, sodium chloride flush, temazepam   Assessment/Plan:   1) Acute systolic HF - EF 26-94% by echo - EF 16% by Myoview with possible TID and mild ischemia - CTA on 8/18 suggestion of severe CAD with totally occluded LAD and high-grade ramus lesion. Also LLL nodule suggestive of lung CA (vs infection) - Cath on 8/20 Confirmed occluded LAD and 99% lesion at ostium of D1. (0% proximal large Ramus. Filling pressures ok.  - Suspect Frequent PVCs may also be contributing to LV dysfunction  - Continue digoxin - Continue carvedilol, losartan and spiro. Titrate as possible. BP too low to titrate or change to [facility].  - Got one dose lasix yesterday and creatinine up slightly and BP down. Hold lasix today. Looks euvolemic - Seen by Dr. Prescott Gum. Plan for CABG tomorrow   2) Frequent PVCs/NSVT - Remains about 20/min ~20% burden despite amio  - likely contributing to cardiomyopathy - Continue amio 200 bid to suppress. Still with frequent PVCs - Keep K > 4.0 Mg > 2.0 - will need sleep study to investigate underlying cause  3) CAD - no s/s ischemia - plan as above. Scheduled for CABG tomorrow  - continue ASA, statin, b-blocker  4) LLL nodule - I reviewed with Radiology personally - Suspect it may be CA but  limited risk factors for this. May be infectious. Suggest re-imaging in 3 weeks - Now on doxy. - I d/w Dr. Prescott Gum. He will attempt to palpate at time of CABG. Will proceed with re-imaging after CABG and then can have needle biopsy if needed  5) Lack of social support - Will need close f/u in HF Clinic with likely involvement of Paramedicine and SW teams    Length of Stay: 9   Glori Bickers MD 12/20/2018, 8:52 AM  Advanced Heart Failure Team Pager [phone] (M-F; 7a - 4p)  Please contact [facility] for night-coverage after hours (4p -7a ) and weekends on [email or web address]

## 2018-12-21 ENCOUNTER — Inpatient Hospital Stay (HOSPITAL_COMMUNITY): Payer: Self-pay | Admitting: Certified Registered Nurse Anesthetist

## 2018-12-21 ENCOUNTER — Encounter (HOSPITAL_COMMUNITY)
Admission: EM | Disposition: A | Discharge status: Home / Self Care | Payer: Self-pay | Source: Home / Self Care | Attending: Cardiothoracic Surgery

## 2018-12-21 ENCOUNTER — Inpatient Hospital Stay (HOSPITAL_COMMUNITY): Payer: Self-pay

## 2018-12-21 ENCOUNTER — Inpatient Hospital Stay (HOSPITAL_COMMUNITY)
Admission: EM | Disposition: A | Discharge status: Home / Self Care | Payer: Self-pay | Source: Home / Self Care | Attending: Cardiothoracic Surgery

## 2018-12-21 DIAGNOSIS — I2510 Atherosclerotic heart disease of native coronary artery without angina pectoris: Secondary | ICD-10-CM

## 2018-12-21 DIAGNOSIS — Z951 Presence of aortocoronary bypass graft: Secondary | ICD-10-CM

## 2018-12-21 HISTORY — PX: CORONARY ARTERY BYPASS GRAFT: SHX141

## 2018-12-21 HISTORY — PX: TEE WITHOUT CARDIOVERSION: SHX5443

## 2018-12-21 HISTORY — DX: Presence of aortocoronary bypass graft: Z95.1

## 2018-12-21 LAB — BASIC METABOLIC PANEL
Anion gap: 10 (ref 5–15)
Anion gap: 8 (ref 5–15)
BUN: 19 mg/dL (ref 6–20)
BUN: 13 mg/dL (ref 6–20)
CO2: 26 mmol/L (ref 22–32)
CO2: 22 mmol/L (ref 22–32)
Calcium: 9 mg/dL (ref 8.9–10.3)
Calcium: 7.8 mg/dL — ABNORMAL LOW (ref 8.9–10.3)
Chloride: 97 mmol/L — ABNORMAL LOW (ref 98–111)
Chloride: 106 mmol/L (ref 98–111)
Creatinine, Ser: 1.26 mg/dL — ABNORMAL HIGH (ref 0.61–1.24)
Creatinine, Ser: 0.96 mg/dL (ref 0.61–1.24)
GFR calc Af Amer: >60 mL/min (ref >60)
GFR calc Af Amer: >60 mL/min (ref >60)
GFR calc non Af Amer: >60 mL/min (ref >60)
GFR calc non Af Amer: >60 mL/min (ref >60)
Glucose, Bld: 118 mg/dL — ABNORMAL HIGH (ref 70–99)
Glucose, Bld: 150 mg/dL — ABNORMAL HIGH (ref 70–99)
Potassium: 3.7 mmol/L (ref 3.5–5.1)
Potassium: 3.7 mmol/L (ref 3.5–5.1)
Sodium: 133 mmol/L — ABNORMAL LOW (ref 135–145)
Sodium: 136 mmol/L (ref 135–145)

## 2018-12-21 LAB — POCT I-STAT 7, (LYTES, BLD GAS, ICA,H+H)
Acid-base deficit: 3 mmol/L — ABNORMAL HIGH (ref 0.0–2.0)
Acid-base deficit: 5 mmol/L — ABNORMAL HIGH (ref 0.0–2.0)
Acid-base deficit: 4 mmol/L — ABNORMAL HIGH (ref 0.0–2.0)
Acid-base deficit: 4 mmol/L — ABNORMAL HIGH (ref 0.0–2.0)
Bicarbonate: 25.8 mmol/L (ref 20.0–28.0)
Bicarbonate: 22.7 mmol/L (ref 20.0–28.0)
Bicarbonate: 21.6 mmol/L (ref 20.0–28.0)
Bicarbonate: 22 mmol/L (ref 20.0–28.0)
Bicarbonate: 22.2 mmol/L (ref 20.0–28.0)
Calcium, Ion: 1 mmol/L — ABNORMAL LOW (ref 1.15–1.40)
Calcium, Ion: 1.24 mmol/L (ref 1.15–1.40)
Calcium, Ion: 1.14 mmol/L — ABNORMAL LOW (ref 1.15–1.40)
Calcium, Ion: 1.15 mmol/L (ref 1.15–1.40)
Calcium, Ion: 1.12 mmol/L — ABNORMAL LOW (ref 1.15–1.40)
HCT: 33 % — ABNORMAL LOW (ref 39.0–52.0)
HCT: 35 % — ABNORMAL LOW (ref 39.0–52.0)
HCT: 43 % (ref 39.0–52.0)
HCT: 40 % (ref 39.0–52.0)
HCT: 37 % — ABNORMAL LOW (ref 39.0–52.0)
Hemoglobin: 11.2 g/dL — ABNORMAL LOW (ref 13.0–17.0)
Hemoglobin: 11.9 g/dL — ABNORMAL LOW (ref 13.0–17.0)
Hemoglobin: 14.6 g/dL (ref 13.0–17.0)
Hemoglobin: 13.6 g/dL (ref 13.0–17.0)
Hemoglobin: 12.6 g/dL — ABNORMAL LOW (ref 13.0–17.0)
O2 Saturation: 100 %
O2 Saturation: 97 %
O2 Saturation: 96 %
O2 Saturation: 96 %
O2 Saturation: 94 %
Patient temperature: 35.9
Patient temperature: 36.4
Patient temperature: 36.5
Potassium: 3.3 mmol/L — ABNORMAL LOW (ref 3.5–5.1)
Potassium: 4 mmol/L (ref 3.5–5.1)
Potassium: 3.9 mmol/L (ref 3.5–5.1)
Potassium: 3.8 mmol/L (ref 3.5–5.1)
Potassium: 3.6 mmol/L (ref 3.5–5.1)
Sodium: 140 mmol/L (ref 135–145)
Sodium: 138 mmol/L (ref 135–145)
Sodium: 139 mmol/L (ref 135–145)
Sodium: 140 mmol/L (ref 135–145)
Sodium: 140 mmol/L (ref 135–145)
TCO2: 27 mmol/L (ref 22–32)
TCO2: 24 mmol/L (ref 22–32)
TCO2: 23 mmol/L (ref 22–32)
TCO2: 23 mmol/L (ref 22–32)
TCO2: 23 mmol/L (ref 22–32)
pCO2 arterial: 44.7 mmHg (ref 32.0–48.0)
pCO2 arterial: 41.8 mmHg (ref 32.0–48.0)
pCO2 arterial: 43.2 mmHg (ref 32.0–48.0)
pCO2 arterial: 42.1 mmHg (ref 32.0–48.0)
pCO2 arterial: 41.5 mmHg (ref 32.0–48.0)
pH, Arterial: 7.37 (ref 7.350–7.450)
pH, Arterial: 7.342 — ABNORMAL LOW (ref 7.350–7.450)
pH, Arterial: 7.301 — ABNORMAL LOW (ref 7.350–7.450)
pH, Arterial: 7.323 — ABNORMAL LOW (ref 7.350–7.450)
pH, Arterial: 7.333 — ABNORMAL LOW (ref 7.350–7.450)
pO2, Arterial: 315 mmHg — ABNORMAL HIGH (ref 83.0–108.0)
pO2, Arterial: 98 mmHg (ref 83.0–108.0)
pO2, Arterial: 89 mmHg (ref 83.0–108.0)
pO2, Arterial: 89 mmHg (ref 83.0–108.0)
pO2, Arterial: 74 mmHg — ABNORMAL LOW (ref 83.0–108.0)

## 2018-12-21 LAB — GLUCOSE, CAPILLARY
Glucose-Capillary: 162 mg/dL — ABNORMAL HIGH (ref 70–99)
Glucose-Capillary: 160 mg/dL — ABNORMAL HIGH (ref 70–99)
Glucose-Capillary: 156 mg/dL — ABNORMAL HIGH (ref 70–99)
Glucose-Capillary: 156 mg/dL — ABNORMAL HIGH (ref 70–99)
Glucose-Capillary: 148 mg/dL — ABNORMAL HIGH (ref 70–99)
Glucose-Capillary: 141 mg/dL — ABNORMAL HIGH (ref 70–99)
Glucose-Capillary: 173 mg/dL — ABNORMAL HIGH (ref 70–99)
Glucose-Capillary: 143 mg/dL — ABNORMAL HIGH (ref 70–99)

## 2018-12-21 LAB — POCT I-STAT 4, (NA,K, GLUC, HGB,HCT)
Glucose, Bld: 112 mg/dL — ABNORMAL HIGH (ref 70–99)
Glucose, Bld: 115 mg/dL — ABNORMAL HIGH (ref 70–99)
Glucose, Bld: 108 mg/dL — ABNORMAL HIGH (ref 70–99)
Glucose, Bld: 116 mg/dL — ABNORMAL HIGH (ref 70–99)
Glucose, Bld: 128 mg/dL — ABNORMAL HIGH (ref 70–99)
Glucose, Bld: 180 mg/dL — ABNORMAL HIGH (ref 70–99)
HCT: 44 % (ref 39.0–52.0)
HCT: 43 % (ref 39.0–52.0)
HCT: 34 % — ABNORMAL LOW (ref 39.0–52.0)
HCT: 35 % — ABNORMAL LOW (ref 39.0–52.0)
HCT: 33 % — ABNORMAL LOW (ref 39.0–52.0)
HCT: 43 % (ref 39.0–52.0)
Hemoglobin: 15 g/dL (ref 13.0–17.0)
Hemoglobin: 14.6 g/dL (ref 13.0–17.0)
Hemoglobin: 11.6 g/dL — ABNORMAL LOW (ref 13.0–17.0)
Hemoglobin: 11.9 g/dL — ABNORMAL LOW (ref 13.0–17.0)
Hemoglobin: 11.2 g/dL — ABNORMAL LOW (ref 13.0–17.0)
Hemoglobin: 14.6 g/dL (ref 13.0–17.0)
Potassium: 3.6 mmol/L (ref 3.5–5.1)
Potassium: 3.5 mmol/L (ref 3.5–5.1)
Potassium: 4.1 mmol/L (ref 3.5–5.1)
Potassium: 4.3 mmol/L (ref 3.5–5.1)
Potassium: 3.9 mmol/L (ref 3.5–5.1)
Potassium: 4 mmol/L (ref 3.5–5.1)
Sodium: 137 mmol/L (ref 135–145)
Sodium: 137 mmol/L (ref 135–145)
Sodium: 136 mmol/L (ref 135–145)
Sodium: 137 mmol/L (ref 135–145)
Sodium: 138 mmol/L (ref 135–145)
Sodium: 138 mmol/L (ref 135–145)

## 2018-12-21 LAB — CBC
HCT: 49.2 % (ref 39.0–52.0)
HCT: 41.6 % (ref 39.0–52.0)
HCT: 36.6 % — ABNORMAL LOW (ref 39.0–52.0)
Hemoglobin: 16.2 g/dL (ref 13.0–17.0)
Hemoglobin: 13.8 g/dL (ref 13.0–17.0)
Hemoglobin: 11.9 g/dL — ABNORMAL LOW (ref 13.0–17.0)
MCH: 29.6 pg (ref 26.0–34.0)
MCH: 30 pg (ref 26.0–34.0)
MCH: 29.7 pg (ref 26.0–34.0)
MCHC: 32.9 g/dL (ref 30.0–36.0)
MCHC: 33.2 g/dL (ref 30.0–36.0)
MCHC: 32.5 g/dL (ref 30.0–36.0)
MCV: 89.8 fL (ref 80.0–100.0)
MCV: 90.4 fL (ref 80.0–100.0)
MCV: 91.3 fL (ref 80.0–100.0)
Platelets: 274 10*3/uL (ref 150–400)
Platelets: 203 10*3/uL (ref 150–400)
Platelets: 192 10*3/uL (ref 150–400)
RBC: 5.48 MIL/uL (ref 4.22–5.81)
RBC: 4.6 MIL/uL (ref 4.22–5.81)
RBC: 4.01 MIL/uL — ABNORMAL LOW (ref 4.22–5.81)
RDW: 12.6 % (ref 11.5–15.5)
RDW: 12.7 % (ref 11.5–15.5)
RDW: 12.6 % (ref 11.5–15.5)
WBC: 11 10*3/uL — ABNORMAL HIGH (ref 4.0–10.5)
WBC: 20.3 10*3/uL — ABNORMAL HIGH (ref 4.0–10.5)
WBC: 14.5 10*3/uL — ABNORMAL HIGH (ref 4.0–10.5)
nRBC: 0 % (ref 0.0–0.2)
nRBC: 0 % (ref 0.0–0.2)
nRBC: 0 % (ref 0.0–0.2)

## 2018-12-21 LAB — HEMOGLOBIN AND HEMATOCRIT, BLOOD
HCT: 34.5 % — ABNORMAL LOW (ref 39.0–52.0)
Hemoglobin: 11.6 g/dL — ABNORMAL LOW (ref 13.0–17.0)

## 2018-12-21 LAB — BLOOD GAS, ARTERIAL
Acid-base deficit: 0.9 mmol/L (ref 0.0–2.0)
Bicarbonate: 22.5 mmol/L (ref 20.0–28.0)
Drawn by: 31101
FIO2: 21
O2 Saturation: 99.3 %
Patient temperature: 98.6
pCO2 arterial: 32.9 mmHg (ref 32.0–48.0)
pH, Arterial: 7.45 (ref 7.350–7.450)
pO2, Arterial: 178 mmHg — ABNORMAL HIGH (ref 83.0–108.0)

## 2018-12-21 LAB — SURGICAL PCR SCREEN
MRSA, PCR: NEGATIVE
Staphylococcus aureus: NEGATIVE

## 2018-12-21 LAB — PROTIME-INR
INR: 1.4 — ABNORMAL HIGH (ref 0.8–1.2)
Prothrombin Time: 17 seconds — ABNORMAL HIGH (ref 11.4–15.2)

## 2018-12-21 LAB — ECHO INTRAOPERATIVE TEE
Height: 72 in
Weight: 3014.4 oz

## 2018-12-21 LAB — PREPARE RBC (CROSSMATCH)

## 2018-12-21 LAB — HEMOGLOBIN A1C
Hgb A1c MFr Bld: 6.1 % — ABNORMAL HIGH (ref 4.8–5.6)
Mean Plasma Glucose: 128.37 mg/dL

## 2018-12-21 LAB — APTT: aPTT: 33 seconds (ref 24–36)

## 2018-12-21 LAB — PLATELET COUNT: Platelets: 153 10*3/uL (ref 150–400)

## 2018-12-21 SURGERY — CORONARY ARTERY BYPASS GRAFTING (CABG)
Anesthesia: General | Site: Chest

## 2018-12-21 MED ORDER — LACTATED RINGERS IV SOLN
INTRAVENOUS | Status: DC
  Administered 2018-12-21: 15:00:00 via INTRAVENOUS

## 2018-12-21 MED ORDER — NOREPINEPHRINE BITARTRATE 1 MG/ML IV SOLN
INTRAVENOUS | Status: DC | PRN
  Administered 2018-12-21: 12:00:00 2 ug/min via INTRAVENOUS

## 2018-12-21 MED ORDER — HEPARIN SODIUM (PORCINE) 1000 UNIT/ML IJ SOLN
INTRAMUSCULAR | Status: AC
  Filled 2018-12-21: qty 1

## 2018-12-21 MED ORDER — SODIUM CHLORIDE 0.9% FLUSH
3.0000 mL | Freq: Two times a day (BID) | INTRAVENOUS | Status: DC
  Administered 2018-12-23 (×2): 3 mL via INTRAVENOUS

## 2018-12-21 MED ORDER — LIDOCAINE 2% (20 MG/ML) 5 ML SYRINGE
INTRAMUSCULAR | Status: AC
  Filled 2018-12-21: qty 10

## 2018-12-21 MED ORDER — MORPHINE SULFATE (PF) 2 MG/ML IV SOLN: 1.0000 mg | INTRAVENOUS | Status: DC | PRN

## 2018-12-21 MED ORDER — SODIUM CHLORIDE 0.9 % IV SOLN
INTRAVENOUS | Status: DC
  Administered 2018-12-21: 15:00:00 via INTRAVENOUS

## 2018-12-21 MED ORDER — ACETAMINOPHEN 160 MG/5ML PO SOLN: 1000.0000 mg | Freq: Four times a day (QID) | ORAL | Status: AC

## 2018-12-21 MED ORDER — SODIUM CHLORIDE 0.9% FLUSH: 10.0000 mL | Freq: Two times a day (BID) | INTRAVENOUS | Status: DC

## 2018-12-21 MED ORDER — VANCOMYCIN HCL IN DEXTROSE 1-5 GM/200ML-% IV SOLN: 1000.0000 mg | Freq: Once | INTRAVENOUS | Status: DC

## 2018-12-21 MED ORDER — ROCURONIUM BROMIDE 10 MG/ML (PF) SYRINGE
PREFILLED_SYRINGE | INTRAVENOUS | Status: AC
  Filled 2018-12-21: qty 20

## 2018-12-21 MED ORDER — SODIUM CHLORIDE 0.45 % IV SOLN
INTRAVENOUS | Status: DC | PRN
  Administered 2018-12-21: 15:00:00 via INTRAVENOUS

## 2018-12-21 MED ORDER — MAGNESIUM SULFATE 4 GM/100ML IV SOLN
4.0000 g | Freq: Once | INTRAVENOUS | Status: AC
  Administered 2018-12-21: 4 g via INTRAVENOUS
  Filled 2018-12-21: qty 100

## 2018-12-21 MED ORDER — AMIODARONE HCL IN DEXTROSE 360-4.14 MG/200ML-% IV SOLN
60.0000 mg/h | INTRAVENOUS | Status: AC
  Administered 2018-12-21: 60 mg/h via INTRAVENOUS
  Filled 2018-12-21: qty 200

## 2018-12-21 MED ORDER — PROPOFOL 10 MG/ML IV BOLUS
INTRAVENOUS | Status: DC | PRN
  Administered 2018-12-21: 50 mg via INTRAVENOUS
  Administered 2018-12-21: 20 mg via INTRAVENOUS

## 2018-12-21 MED ORDER — CHLORHEXIDINE GLUCONATE CLOTH 2 % EX PADS
6.0000 | MEDICATED_PAD | Freq: Every day | CUTANEOUS | Status: DC
  Administered 2018-12-21: 6 via TOPICAL

## 2018-12-21 MED ORDER — HEPARIN SODIUM (PORCINE) 1000 UNIT/ML IJ SOLN
INTRAMUSCULAR | Status: DC | PRN
  Administered 2018-12-21: 2000 [IU] via INTRAVENOUS
  Administered 2018-12-21: 24000 [IU] via INTRAVENOUS

## 2018-12-21 MED ORDER — VANCOMYCIN HCL IN DEXTROSE 1-5 GM/200ML-% IV SOLN
1000.0000 mg | Freq: Two times a day (BID) | INTRAVENOUS | Status: AC
  Administered 2018-12-21 – 2018-12-22 (×2): 1000 mg via INTRAVENOUS
  Filled 2018-12-21 (×2): qty 200

## 2018-12-21 MED ORDER — PROTAMINE SULFATE 10 MG/ML IV SOLN
INTRAVENOUS | Status: AC
  Filled 2018-12-21: qty 25

## 2018-12-21 MED ORDER — ONDANSETRON HCL 4 MG/2ML IJ SOLN
INTRAMUSCULAR | Status: AC
  Filled 2018-12-21: qty 2

## 2018-12-21 MED ORDER — MILRINONE LACTATE IN DEXTROSE 20-5 MG/100ML-% IV SOLN
0.2500 ug/kg/min | INTRAVENOUS | Status: DC
  Administered 2018-12-21 (×2): 0.3 ug/kg/min via INTRAVENOUS
  Administered 2018-12-22: 0.25 ug/kg/min via INTRAVENOUS
  Filled 2018-12-21 (×2): qty 100

## 2018-12-21 MED ORDER — ACETAMINOPHEN 160 MG/5ML PO SOLN: 650.0000 mg | Freq: Once | ORAL | Status: AC

## 2018-12-21 MED ORDER — FAMOTIDINE IN NACL 20-0.9 MG/50ML-% IV SOLN
20.0000 mg | Freq: Two times a day (BID) | INTRAVENOUS | Status: DC
  Administered 2018-12-21: 20 mg via INTRAVENOUS

## 2018-12-21 MED ORDER — SODIUM CHLORIDE 0.9 % IV SOLN: 250.0000 mL | INTRAVENOUS | Status: DC

## 2018-12-21 MED ORDER — CHLORHEXIDINE GLUCONATE CLOTH 2 % EX PADS
6.0000 | MEDICATED_PAD | Freq: Every day | CUTANEOUS | Status: DC
  Administered 2018-12-23 – 2018-12-30 (×7): 6 via TOPICAL

## 2018-12-21 MED ORDER — MIDAZOLAM HCL 2 MG/2ML IJ SOLN
2.0000 mg | INTRAMUSCULAR | Status: DC | PRN
  Administered 2018-12-21: 2 mg via INTRAVENOUS
  Filled 2018-12-21: qty 2

## 2018-12-21 MED ORDER — METOPROLOL TARTRATE 12.5 MG HALF TABLET
12.5000 mg | ORAL_TABLET | Freq: Two times a day (BID) | ORAL | Status: DC
  Filled 2018-12-21: qty 1

## 2018-12-21 MED ORDER — LACTATED RINGERS IV SOLN: 500.0000 mL | Freq: Once | INTRAVENOUS | Status: DC | PRN

## 2018-12-21 MED ORDER — TRAMADOL HCL 50 MG PO TABS
50.0000 mg | ORAL_TABLET | ORAL | Status: DC | PRN
  Administered 2018-12-22 – 2018-12-23 (×5): 50 mg via ORAL
  Filled 2018-12-21 (×5): qty 1

## 2018-12-21 MED ORDER — 0.9 % SODIUM CHLORIDE (POUR BTL) OPTIME
TOPICAL | Status: DC | PRN
  Administered 2018-12-21: 5000 mL
  Administered 2018-12-21: 12:00:00 1000 mL

## 2018-12-21 MED ORDER — PROPOFOL 10 MG/ML IV BOLUS
INTRAVENOUS | Status: AC
  Filled 2018-12-21: qty 20

## 2018-12-21 MED ORDER — MILRINONE LACTATE IN DEXTROSE 20-5 MG/100ML-% IV SOLN
INTRAVENOUS | Status: DC | PRN
  Administered 2018-12-21: 0.25 ug/kg/min via INTRAVENOUS

## 2018-12-21 MED ORDER — NITROGLYCERIN IN D5W 200-5 MCG/ML-% IV SOLN
0.0000 ug/min | INTRAVENOUS | Status: DC
  Administered 2018-12-21: 0 ug/min via INTRAVENOUS

## 2018-12-21 MED ORDER — LACTATED RINGERS IV SOLN
INTRAVENOUS | Status: DC | PRN
  Administered 2018-12-21: 08:00:00 via INTRAVENOUS

## 2018-12-21 MED ORDER — MIDAZOLAM HCL 5 MG/5ML IJ SOLN
INTRAMUSCULAR | Status: DC | PRN
  Administered 2018-12-21: 2 mg via INTRAVENOUS
  Administered 2018-12-21 (×2): 1 mg via INTRAVENOUS
  Administered 2018-12-21: 2 mg via INTRAVENOUS
  Administered 2018-12-21 (×2): 1 mg via INTRAVENOUS
  Administered 2018-12-21: 2 mg via INTRAVENOUS

## 2018-12-21 MED ORDER — EPHEDRINE SULFATE-NACL 50-0.9 MG/10ML-% IV SOSY
PREFILLED_SYRINGE | INTRAVENOUS | Status: DC | PRN
  Administered 2018-12-21 (×2): 5 mg via INTRAVENOUS

## 2018-12-21 MED ORDER — CHLORHEXIDINE GLUCONATE CLOTH 2 % EX PADS: 6.0000 | MEDICATED_PAD | Freq: Every day | CUTANEOUS | Status: DC

## 2018-12-21 MED ORDER — PHENYLEPHRINE 40 MCG/ML (10ML) SYRINGE FOR IV PUSH (FOR BLOOD PRESSURE SUPPORT)
PREFILLED_SYRINGE | INTRAVENOUS | Status: DC | PRN
  Administered 2018-12-21 (×2): 80 ug via INTRAVENOUS
  Administered 2018-12-21: 120 ug via INTRAVENOUS

## 2018-12-21 MED ORDER — SODIUM BICARBONATE 8.4 % IV SOLN
50.0000 meq | Freq: Once | INTRAVENOUS | Status: AC
  Administered 2018-12-21: 50 meq via INTRAVENOUS

## 2018-12-21 MED ORDER — METOPROLOL TARTRATE 5 MG/5ML IV SOLN
2.5000 mg | INTRAVENOUS | Status: DC | PRN
  Administered 2018-12-23: 5 mg via INTRAVENOUS
  Filled 2018-12-21: qty 5

## 2018-12-21 MED ORDER — ONDANSETRON HCL 4 MG/2ML IJ SOLN: 4.0000 mg | Freq: Four times a day (QID) | INTRAMUSCULAR | Status: DC | PRN

## 2018-12-21 MED ORDER — ALBUMIN HUMAN 5 % IV SOLN
250.0000 mL | INTRAVENOUS | Status: DC | PRN
  Administered 2018-12-21 (×2): 12.5 g via INTRAVENOUS
  Filled 2018-12-21: qty 500

## 2018-12-21 MED ORDER — ROCURONIUM BROMIDE 10 MG/ML (PF) SYRINGE
PREFILLED_SYRINGE | INTRAVENOUS | Status: DC | PRN
  Administered 2018-12-21: 60 mg via INTRAVENOUS
  Administered 2018-12-21: 50 mg via INTRAVENOUS
  Administered 2018-12-21: 40 mg via INTRAVENOUS

## 2018-12-21 MED ORDER — DEXMEDETOMIDINE HCL IN NACL 200 MCG/50ML IV SOLN
0.0000 ug/kg/h | INTRAVENOUS | Status: DC
  Administered 2018-12-21: 0.7 ug/kg/h via INTRAVENOUS

## 2018-12-21 MED ORDER — LIDOCAINE IN D5W 4-5 MG/ML-% IV SOLN
1.0000 mg/min | INTRAVENOUS | Status: AC
  Administered 2018-12-21: 1 mg/min via INTRAVENOUS
  Filled 2018-12-21: qty 500

## 2018-12-21 MED ORDER — INSULIN REGULAR BOLUS VIA INFUSION
0.0000 [IU] | Freq: Three times a day (TID) | INTRAVENOUS | Status: DC
  Filled 2018-12-21: qty 10

## 2018-12-21 MED ORDER — ACETAMINOPHEN 650 MG RE SUPP
650.0000 mg | Freq: Once | RECTAL | Status: AC
  Administered 2018-12-21: 650 mg via RECTAL

## 2018-12-21 MED ORDER — ACETAMINOPHEN 500 MG PO TABS
1000.0000 mg | ORAL_TABLET | Freq: Four times a day (QID) | ORAL | Status: AC
  Administered 2018-12-21 – 2018-12-26 (×20): 1000 mg via ORAL
  Filled 2018-12-21 (×20): qty 2

## 2018-12-21 MED ORDER — SODIUM CHLORIDE 0.9% FLUSH: 3.0000 mL | INTRAVENOUS | Status: DC | PRN

## 2018-12-21 MED ORDER — NOREPINEPHRINE 4 MG/250ML-% IV SOLN
0.0000 ug/min | INTRAVENOUS | Status: DC
  Administered 2018-12-21: 15 ug/min via INTRAVENOUS
  Administered 2018-12-21: 7 ug/min via INTRAVENOUS
  Administered 2018-12-22: 8 ug/min via INTRAVENOUS
  Filled 2018-12-21 (×2): qty 250

## 2018-12-21 MED ORDER — SODIUM CHLORIDE (PF) 0.9 % IJ SOLN
INTRAMUSCULAR | Status: AC
  Filled 2018-12-21: qty 10

## 2018-12-21 MED ORDER — DOCUSATE SODIUM 100 MG PO CAPS
200.0000 mg | ORAL_CAPSULE | Freq: Every day | ORAL | Status: DC
  Administered 2018-12-22 – 2018-12-29 (×8): 200 mg via ORAL
  Filled 2018-12-21 (×8): qty 2

## 2018-12-21 MED ORDER — FENTANYL CITRATE (PF) 250 MCG/5ML IJ SOLN
INTRAMUSCULAR | Status: AC
  Filled 2018-12-21: qty 25

## 2018-12-21 MED ORDER — ASPIRIN 81 MG PO CHEW
324.0000 mg | CHEWABLE_TABLET | Freq: Every day | ORAL | Status: DC
  Filled 2018-12-21: qty 4

## 2018-12-21 MED ORDER — EPINEPHRINE HCL 5 MG/250ML IV SOLN IN NS
0.5000 ug/min | INTRAVENOUS | Status: DC
  Administered 2018-12-21: 5 ug/min via INTRAVENOUS
  Administered 2018-12-21: 0.6 ug/min via INTRAVENOUS
  Filled 2018-12-21 (×3): qty 250

## 2018-12-21 MED ORDER — SODIUM CHLORIDE 0.9% FLUSH: 10.0000 mL | INTRAVENOUS | Status: DC | PRN

## 2018-12-21 MED ORDER — PHENYLEPHRINE HCL-NACL 20-0.9 MG/250ML-% IV SOLN
0.0000 ug/min | INTRAVENOUS | Status: DC
  Administered 2018-12-21: 25 ug/min via INTRAVENOUS

## 2018-12-21 MED ORDER — ONDANSETRON HCL 4 MG/2ML IJ SOLN
INTRAMUSCULAR | Status: DC | PRN
  Administered 2018-12-21: 4 mg via INTRAVENOUS

## 2018-12-21 MED ORDER — NOREPINEPHRINE 4 MG/250ML-% IV SOLN: 0.0000 ug/min | INTRAVENOUS | Status: DC

## 2018-12-21 MED ORDER — EPINEPHRINE PF 1 MG/ML IJ SOLN
INTRAVENOUS | Status: DC | PRN
  Administered 2018-12-21: 1 ug/min via INTRAVENOUS

## 2018-12-21 MED ORDER — AMIODARONE HCL IN DEXTROSE 360-4.14 MG/200ML-% IV SOLN
30.0000 mg/h | INTRAVENOUS | Status: DC
  Administered 2018-12-21 – 2018-12-23 (×6): 30 mg/h via INTRAVENOUS
  Filled 2018-12-21 (×6): qty 200

## 2018-12-21 MED ORDER — POTASSIUM CHLORIDE 10 MEQ/50ML IV SOLN: 10.0000 meq | INTRAVENOUS | Status: AC

## 2018-12-21 MED ORDER — OXYCODONE HCL 5 MG PO TABS
5.0000 mg | ORAL_TABLET | ORAL | Status: DC | PRN
  Administered 2018-12-22: 21:00:00 10 mg via ORAL
  Filled 2018-12-21: qty 2

## 2018-12-21 MED ORDER — ASPIRIN EC 325 MG PO TBEC
325.0000 mg | DELAYED_RELEASE_TABLET | Freq: Every day | ORAL | Status: DC
  Administered 2018-12-22 – 2018-12-30 (×9): 325 mg via ORAL
  Filled 2018-12-21 (×9): qty 1

## 2018-12-21 MED ORDER — PHENYLEPHRINE 40 MCG/ML (10ML) SYRINGE FOR IV PUSH (FOR BLOOD PRESSURE SUPPORT)
PREFILLED_SYRINGE | INTRAVENOUS | Status: AC
  Filled 2018-12-21: qty 10

## 2018-12-21 MED ORDER — LACTATED RINGERS IV SOLN: INTRAVENOUS | Status: DC

## 2018-12-21 MED ORDER — FENTANYL CITRATE (PF) 250 MCG/5ML IJ SOLN
INTRAMUSCULAR | Status: DC | PRN
  Administered 2018-12-21: 100 ug via INTRAVENOUS
  Administered 2018-12-21: 50 ug via INTRAVENOUS
  Administered 2018-12-21: 100 ug via INTRAVENOUS
  Administered 2018-12-21: 50 ug via INTRAVENOUS
  Administered 2018-12-21: 250 ug via INTRAVENOUS
  Administered 2018-12-21 (×2): 100 ug via INTRAVENOUS
  Administered 2018-12-21: 50 ug via INTRAVENOUS
  Administered 2018-12-21 (×3): 100 ug via INTRAVENOUS
  Administered 2018-12-21: 50 ug via INTRAVENOUS
  Administered 2018-12-21: 100 ug via INTRAVENOUS

## 2018-12-21 MED ORDER — BISACODYL 10 MG RE SUPP: 10.0000 mg | Freq: Every day | RECTAL | Status: DC

## 2018-12-21 MED ORDER — METOPROLOL TARTRATE 25 MG/10 ML ORAL SUSPENSION: 12.5000 mg | Freq: Two times a day (BID) | ORAL | Status: DC

## 2018-12-21 MED ORDER — SODIUM CHLORIDE 0.9 % IV SOLN
INTRAVENOUS | Status: DC | PRN
  Administered 2018-12-21: 08:00:00 via INTRAVENOUS

## 2018-12-21 MED ORDER — BISACODYL 5 MG PO TBEC
10.0000 mg | DELAYED_RELEASE_TABLET | Freq: Every day | ORAL | Status: DC
  Administered 2018-12-22 – 2018-12-29 (×8): 10 mg via ORAL
  Filled 2018-12-21 (×8): qty 2

## 2018-12-21 MED ORDER — PROTAMINE SULFATE 10 MG/ML IV SOLN
INTRAVENOUS | Status: DC | PRN
  Administered 2018-12-21: 20 mg via INTRAVENOUS
  Administered 2018-12-21: 240 mg via INTRAVENOUS

## 2018-12-21 MED ORDER — SODIUM CHLORIDE 0.9 % IV SOLN: 10.0000 mL/h | Freq: Once | INTRAVENOUS | Status: DC

## 2018-12-21 MED ORDER — SODIUM CHLORIDE (PF) 0.9 % IJ SOLN
OROMUCOSAL | Status: DC | PRN
  Administered 2018-12-21 (×3): 4 mL via TOPICAL

## 2018-12-21 MED ORDER — SODIUM CHLORIDE 0.9 % IV SOLN
1.5000 g | Freq: Two times a day (BID) | INTRAVENOUS | Status: AC
  Administered 2018-12-21 – 2018-12-23 (×4): 1.5 g via INTRAVENOUS
  Filled 2018-12-21 (×4): qty 1.5

## 2018-12-21 MED ORDER — LIDOCAINE 2% (20 MG/ML) 5 ML SYRINGE
INTRAMUSCULAR | Status: DC | PRN
  Administered 2018-12-21 (×2): 100 mg via INTRAVENOUS

## 2018-12-21 MED ORDER — MIDAZOLAM HCL (PF) 10 MG/2ML IJ SOLN
INTRAMUSCULAR | Status: AC
  Filled 2018-12-21: qty 2

## 2018-12-21 MED ORDER — INSULIN REGULAR(HUMAN) IN NACL 100-0.9 UT/100ML-% IV SOLN
INTRAVENOUS | Status: DC
  Administered 2018-12-21: 1.4 [IU]/h via INTRAVENOUS
  Administered 2018-12-22: 2.2 [IU]/h via INTRAVENOUS
  Filled 2018-12-21: qty 100

## 2018-12-21 MED ORDER — PANTOPRAZOLE SODIUM 40 MG PO TBEC
40.0000 mg | DELAYED_RELEASE_TABLET | Freq: Every day | ORAL | Status: DC
  Administered 2018-12-23 – 2018-12-30 (×8): 40 mg via ORAL
  Filled 2018-12-21 (×8): qty 1

## 2018-12-21 MED ORDER — CHLORHEXIDINE GLUCONATE 0.12 % MT SOLN
15.0000 mL | OROMUCOSAL | Status: AC
  Administered 2018-12-21: 15 mL via OROMUCOSAL

## 2018-12-21 MED ORDER — HEMOSTATIC AGENTS (NO CHARGE) OPTIME
TOPICAL | Status: DC | PRN
  Administered 2018-12-21 (×3): 1 via TOPICAL

## 2018-12-21 MED ORDER — POTASSIUM CHLORIDE 10 MEQ/50ML IV SOLN
10.0000 meq | INTRAVENOUS | Status: AC
  Administered 2018-12-21 (×3): 10 meq via INTRAVENOUS

## 2018-12-21 MED ORDER — LACTATED RINGERS IV SOLN
INTRAVENOUS | Status: DC | PRN
  Administered 2018-12-21: 07:00:00 via INTRAVENOUS

## 2018-12-21 MED ORDER — EPHEDRINE 5 MG/ML INJ
INTRAVENOUS | Status: AC
  Filled 2018-12-21: qty 10

## 2018-12-21 SURGICAL SUPPLY — 101 items
ADAPTER CARDIO PERF ANTE/RETRO (ADAPTER) ×4 IMPLANT
ADPR PRFSN 84XANTGRD RTRGD (ADAPTER) ×2
AGENT HMST KT MTR STRL THRMB (HEMOSTASIS) ×2
BAG DECANTER FOR FLEXI CONT (MISCELLANEOUS) ×4 IMPLANT
BASKET HEART  (ORDER IN 25'S) (MISCELLANEOUS) ×1
BASKET HEART (ORDER IN 25'S) (MISCELLANEOUS) ×1
BASKET HEART (ORDER IN 25S) (MISCELLANEOUS) ×2 IMPLANT
BLADE CLIPPER SURG (BLADE) ×4 IMPLANT
BLADE STERNUM SYSTEM 6 (BLADE) ×4 IMPLANT
BLADE SURG 12 STRL SS (BLADE) ×4 IMPLANT
BNDG ELASTIC 4X5.8 VLCR STR LF (GAUZE/BANDAGES/DRESSINGS) ×2 IMPLANT
BNDG ELASTIC 6X5.8 VLCR STR LF (GAUZE/BANDAGES/DRESSINGS) ×2 IMPLANT
BNDG GAUZE ELAST 4 BULKY (GAUZE/BANDAGES/DRESSINGS) ×2 IMPLANT
CABLE PACING FASLOC BLUE (MISCELLANEOUS) ×2 IMPLANT
CANISTER SUCT 3000ML PPV (MISCELLANEOUS) ×4 IMPLANT
CANNULA GUNDRY RCSP 15FR (MISCELLANEOUS) ×4 IMPLANT
CATH CPB KIT VANTRIGT (MISCELLANEOUS) ×4 IMPLANT
CATH ROBINSON RED A/P 18FR (CATHETERS) ×12 IMPLANT
CATH THORACIC 28FR RT ANG (CATHETERS) ×2 IMPLANT
CATH THORACIC 36FR RT ANG (CATHETERS) ×4 IMPLANT
CLIP VESOCCLUDE SM WIDE 24/CT (CLIP) ×2 IMPLANT
DRAIN CHANNEL 32F RND 10.7 FF (WOUND CARE) ×4 IMPLANT
DRAPE CARDIOVASCULAR INCISE (DRAPES) ×4
DRAPE SLUSH/WARMER DISC (DRAPES) ×4 IMPLANT
DRAPE SRG 135X102X78XABS (DRAPES) ×2 IMPLANT
DRSG AQUACEL AG ADV 3.5X14 (GAUZE/BANDAGES/DRESSINGS) ×2 IMPLANT
ELECT BLADE 4.0 EZ CLEAN MEGAD (MISCELLANEOUS) ×4
ELECT CAUTERY BLADE 6.4 (BLADE) ×4 IMPLANT
ELECT REM PT RETURN 9FT ADLT (ELECTROSURGICAL) ×8
ELECTRODE BLDE 4.0 EZ CLN MEGD (MISCELLANEOUS) ×2 IMPLANT
ELECTRODE REM PT RTRN 9FT ADLT (ELECTROSURGICAL) ×4 IMPLANT
FELT TEFLON 1X6 (MISCELLANEOUS) ×6 IMPLANT
GAUZE SPONGE 4X4 12PLY STRL (GAUZE/BANDAGES/DRESSINGS) ×4 IMPLANT
GLOVE BIO SURGEON STRL SZ 6.5 (GLOVE) ×1 IMPLANT
GLOVE BIO SURGEON STRL SZ7.5 (GLOVE) ×12 IMPLANT
GLOVE BIO SURGEONS STRL SZ 6.5 (GLOVE) ×1
GLOVE BIOGEL M 6.5 STRL (GLOVE) ×12 IMPLANT
GLOVE BIOGEL PI IND STRL 6.5 (GLOVE) IMPLANT
GLOVE BIOGEL PI IND STRL 7.0 (GLOVE) IMPLANT
GLOVE BIOGEL PI IND STRL 9 (GLOVE) IMPLANT
GLOVE BIOGEL PI INDICATOR 6.5 (GLOVE) ×2
GLOVE BIOGEL PI INDICATOR 7.0 (GLOVE) ×2
GLOVE BIOGEL PI INDICATOR 9 (GLOVE) ×2
GOWN STRL REUS W/ TWL LRG LVL3 (GOWN DISPOSABLE) ×8 IMPLANT
GOWN STRL REUS W/TWL LRG LVL3 (GOWN DISPOSABLE) ×32
HEMOSTAT POWDER SURGIFOAM 1G (HEMOSTASIS) ×12 IMPLANT
HEMOSTAT SURGICEL 2X14 (HEMOSTASIS) ×4 IMPLANT
INSERT FOGARTY XLG (MISCELLANEOUS) IMPLANT
KIT BASIN OR (CUSTOM PROCEDURE TRAY) ×4 IMPLANT
KIT SUCTION CATH 14FR (SUCTIONS) ×4 IMPLANT
KIT TURNOVER KIT B (KITS) ×4 IMPLANT
KIT VASOVIEW HEMOPRO 2 VH 4000 (KITS) ×4 IMPLANT
LEAD PACING MYOCARDI (MISCELLANEOUS) ×6 IMPLANT
MARKER GRAFT CORONARY BYPASS (MISCELLANEOUS) ×12 IMPLANT
NS IRRIG 1000ML POUR BTL (IV SOLUTION) ×20 IMPLANT
PACK E OPEN HEART (SUTURE) ×4 IMPLANT
PACK OPEN HEART (CUSTOM PROCEDURE TRAY) ×4 IMPLANT
PAD ELECT DEFIB RADIOL ZOLL (MISCELLANEOUS) ×4 IMPLANT
PENCIL BUTTON HOLSTER BLD 10FT (ELECTRODE) ×4 IMPLANT
POSITIONER HEAD DONUT 9IN (MISCELLANEOUS) ×4 IMPLANT
POWDER SURGICEL 3.0 GRAM (HEMOSTASIS) ×2 IMPLANT
PUNCH AORTIC ROTATE  4.5MM 8IN (MISCELLANEOUS) ×2 IMPLANT
PUNCH AORTIC ROTATE 4.0MM (MISCELLANEOUS) IMPLANT
PUNCH AORTIC ROTATE 4.5MM 8IN (MISCELLANEOUS) IMPLANT
PUNCH AORTIC ROTATE 5MM 8IN (MISCELLANEOUS) IMPLANT
SET CARDIOPLEGIA MPS 5001102 (MISCELLANEOUS) ×2 IMPLANT
SURGIFLO W/THROMBIN 8M KIT (HEMOSTASIS) ×4 IMPLANT
SUT BONE WAX W31G (SUTURE) ×4 IMPLANT
SUT MNCRL AB 4-0 PS2 18 (SUTURE) IMPLANT
SUT PROLENE 3 0 SH DA (SUTURE) IMPLANT
SUT PROLENE 3 0 SH1 36 (SUTURE) IMPLANT
SUT PROLENE 4 0 RB 1 (SUTURE) ×20
SUT PROLENE 4 0 SH DA (SUTURE) ×8 IMPLANT
SUT PROLENE 4-0 RB1 .5 CRCL 36 (SUTURE) ×2 IMPLANT
SUT PROLENE 5 0 C 1 36 (SUTURE) ×4 IMPLANT
SUT PROLENE 6 0 C 1 30 (SUTURE) ×2 IMPLANT
SUT PROLENE 6 0 CC (SUTURE) ×12 IMPLANT
SUT PROLENE 8 0 BV175 6 (SUTURE) ×2 IMPLANT
SUT PROLENE BLUE 7 0 (SUTURE) ×4 IMPLANT
SUT PROLENE POLY MONO (SUTURE) ×4 IMPLANT
SUT SILK  1 MH (SUTURE)
SUT SILK 1 MH (SUTURE) IMPLANT
SUT SILK 2 0 SH CR/8 (SUTURE) ×4 IMPLANT
SUT SILK 3 0 SH CR/8 (SUTURE) IMPLANT
SUT STEEL 6MS V (SUTURE) ×10 IMPLANT
SUT STEEL SZ 6 DBL 3X14 BALL (SUTURE) ×6 IMPLANT
SUT VIC AB 1 CTX 36 (SUTURE) ×16
SUT VIC AB 1 CTX36XBRD ANBCTR (SUTURE) ×4 IMPLANT
SUT VIC AB 2-0 CT1 27 (SUTURE) ×4
SUT VIC AB 2-0 CT1 TAPERPNT 27 (SUTURE) IMPLANT
SUT VIC AB 2-0 CTX 27 (SUTURE) IMPLANT
SUT VIC AB 3-0 X1 27 (SUTURE) ×2 IMPLANT
SYSTEM SAHARA CHEST DRAIN ATS (WOUND CARE) ×4 IMPLANT
TAPE CLOTH SOFT 2X10 (GAUZE/BANDAGES/DRESSINGS) ×2 IMPLANT
TAPE CLOTH SURG 4X10 WHT LF (GAUZE/BANDAGES/DRESSINGS) ×4 IMPLANT
TOWEL GREEN STERILE (TOWEL DISPOSABLE) ×4 IMPLANT
TOWEL GREEN STERILE FF (TOWEL DISPOSABLE) ×4 IMPLANT
TRAY FOLEY SLVR 16FR TEMP STAT (SET/KITS/TRAYS/PACK) ×4 IMPLANT
TUBING LAP HI FLOW INSUFFLATIO (TUBING) ×4 IMPLANT
UNDERPAD 30X30 (UNDERPADS AND DIAPERS) ×4 IMPLANT
WATER STERILE IRR 1000ML POUR (IV SOLUTION) ×8 IMPLANT

## 2018-12-21 NOTE — Brief Op Note (Signed)
12/09/2018 - 12/21/2018  2:29 PM  PATIENT:  Carollee Sires  53 y.o. male  PRE-OPERATIVE DIAGNOSIS:  coronary artery disease  POST-OPERATIVE DIAGNOSIS:  coronary artery disease  PROCEDURE:  Procedure(s): CORONARY ARTERY BYPASS GRAFTING (CABG) x 3, USING LEFT INTERNAL MAMMARY ARTERY (LIMA) AND RIGHT GREATER SAPHENOUS VEIN (SVG) HARVESTED ENDOSCOPICALLY;    LIMA-LAD, SVG-RAMUS, SVG-DIAG (N/A) TRANSESOPHAGEAL ECHOCARDIOGRAM (TEE) (N/A) LIMA-LAD SVG-RAMUS SVG-DIAG  SURGEON:  Surgeon(s) and Role:    Ivin Poot, MD - Primary  PHYSICIAN ASSISTANT: Yoniel Arkwright PA-C  ANESTHESIA:   general  EBL:  740 mL   BLOOD ADMINISTERED:none  DRAINS: LEFT PLEURAL AND PERICARDIAL CHEST TUBES   LOCAL MEDICATIONS USED:  NONE  SPECIMEN:  No Specimen  DISPOSITION OF SPECIMEN:  N/A  COUNTS:  YES  TOURNIQUET:  * No tourniquets in log *  DICTATION: .Other Dictation: Dictation Number PENDING  PLAN OF CARE: Admit to inpatient   PATIENT DISPOSITION:  ICU - intubated and hemodynamically stable.   Delay start of Pharmacological VTE agent (>24hrs) due to surgical blood loss or risk of bleeding: yes  COMPLICATIONS: NO KNOWN

## 2018-12-21 NOTE — Anesthesia Postprocedure Evaluation (Signed)
Anesthesia Post Note  Patient: Jonathan King  Procedure(s) Performed: CORONARY ARTERY BYPASS GRAFTING (CABG) x 3, USING LEFT INTERNAL MAMMARY ARTERY (LIMA) AND RIGHT GREATER SAPHENOUS VEIN (SVG) HARVESTED ENDOSCOPICALLY;    LIMA-LAD, SVG-RAMUS, SVG-DIAG (N/A Chest) TRANSESOPHAGEAL ECHOCARDIOGRAM (TEE) (N/A )     Patient location during evaluation: SICU Anesthesia Type: General Level of consciousness: sedated Pain management: pain level controlled Vital Signs Assessment: post-procedure vital signs reviewed and stable Respiratory status: patient remains intubated per anesthesia plan Cardiovascular status: stable Postop Assessment: no apparent nausea or vomiting Anesthetic complications: no    Last Vitals:  Vitals:   12/21/18 1744 12/21/18 1745  BP: (!) 100/56   Pulse: 80 80  Resp: 14 14  Temp:  (!) 36.2 C  SpO2: 99% 100%    Last Pain:  Vitals:   12/21/18 0334  TempSrc: Oral  PainSc:                  Karyl Kinnier Ellender

## 2018-12-21 NOTE — Anesthesia Procedure Notes (Signed)
Arterial Line Insertion Start/End8/25/2020 6:55 AM Performed by: Alain Marion, CRNA, CRNA  Preanesthetic checklist: patient identified, IV checked, site marked, risks and benefits discussed, surgical consent, monitors and equipment checked, pre-op evaluation, timeout performed and anesthesia consent Lidocaine 1% used for infiltration and patient sedated Left, radial was placed Catheter size: 20 G Hand hygiene performed  and maximum sterile barriers used   Attempts: 1 Procedure performed without using ultrasound guided technique. Following insertion, dressing applied and Biopatch. Post procedure assessment: normal  Patient tolerated the procedure well with no immediate complications.

## 2018-12-21 NOTE — Progress Notes (Signed)
  Echocardiogram Echocardiogram Transesophageal has been performed.  Jonathan King 12/21/2018, 10:24 AM

## 2018-12-21 NOTE — Anesthesia Procedure Notes (Signed)
Central Venous Catheter Insertion Performed by: Murvin Natal, MD, anesthesiologist Start/End8/25/2020 8:00 AM, 12/21/2018 8:15 AM Patient location: OR. Preanesthetic checklist: patient identified, IV checked, site marked, risks and benefits discussed, surgical consent, monitors and equipment checked, pre-op evaluation, timeout performed and anesthesia consent Position: Trendelenburg Hand hygiene performed , maximum sterile barriers used  and Seldinger technique used Catheter size: 8.5 Fr Total catheter length 10. PA cath was placed.Sheath introducer Swan type:thermodilution PA Cath depth:47 Procedure performed using ultrasound guided technique. Ultrasound Notes:anatomy identified, needle tip was noted to be adjacent to the nerve/plexus identified, no ultrasound evidence of intravascular and/or intraneural injection and image(s) printed for medical record Attempts: 1 Following insertion, line sutured, dressing applied and Biopatch. Post procedure assessment: blood return through all ports, free fluid flow and no air  Patient tolerated the procedure well with no immediate complications.

## 2018-12-21 NOTE — Transfer of Care (Signed)
Immediate Anesthesia Transfer of Care Note  Patient: Jonathan King  Procedure(s) Performed: CORONARY ARTERY BYPASS GRAFTING (CABG) x 3, USING LEFT INTERNAL MAMMARY ARTERY (LIMA) AND RIGHT GREATER SAPHENOUS VEIN (SVG) HARVESTED ENDOSCOPICALLY;    LIMA-LAD, SVG-RAMUS, SVG-DIAG (N/A Chest) TRANSESOPHAGEAL ECHOCARDIOGRAM (TEE) (N/A )  Patient Location: ICU  Anesthesia Type:General  Level of Consciousness: Patient remains intubated per anesthesia plan  Airway & Oxygen Therapy: Patient remains intubated per anesthesia plan and Patient placed on Ventilator (see vital sign flow sheet for setting)  Post-op Assessment: Report given to RN and Post -op Vital signs reviewed and stable  Post vital signs: Reviewed and stable  Last Vitals:  Vitals Value Taken Time  BP 124/80 12/21/18 1443  Temp 35.8 C 12/21/18 1456  Pulse 30 12/21/18 1456  Resp 18 12/21/18 1456  SpO2 94 % 12/21/18 1456  Vitals shown include unvalidated device data.  Last Pain:  Vitals:   12/21/18 0334  TempSrc: Oral  PainSc:          Complications: No apparent anesthesia complications

## 2018-12-21 NOTE — Progress Notes (Signed)
EVENING ROUNDS NOTE :     [address]       [place] [address]             [phone]                 Day of Surgery Procedure(s) (LRB): CORONARY ARTERY BYPASS GRAFTING (CABG) x 3, USING LEFT INTERNAL MAMMARY ARTERY (LIMA) AND RIGHT GREATER SAPHENOUS VEIN (SVG) HARVESTED ENDOSCOPICALLY;    LIMA-LAD, SVG-RAMUS, SVG-DIAG (N/A) TRANSESOPHAGEAL ECHOCARDIOGRAM (TEE) (N/A)   Total Length of Stay:  LOS: 10 days  Events:  Waking up Minimal CT output Good UOP On levo, neo, epi, milr, lido, and amio Lidocaine will come off later tonight.    BP 120/60   Pulse 88   Temp 98.3 F (36.8 C) (Oral)   Resp 12   Ht 6' (1.829 m)   Wt 85.5 kg   SpO2 99%   BMI 25.55 kg/m      Vent Mode: SIMV;PRVC;PSV FiO2 (%):  [50 %] 50 % Set Rate:  [12 bmp] 12 bmp Vt Set:  [620 mL-650 mL] 650 mL PEEP:  [5 cmH20] 5 cmH20 Pressure Support:  [10 cmH20] 10 cmH20 Plateau Pressure:  [16 cmH20-17 cmH20] 16 cmH20  . sodium chloride Stopped (12/21/18 1448)  . [START ON 12/22/2018] sodium chloride    . sodium chloride 20 mL/hr at 12/21/18 1445  . albumin human    . amiodarone 60 mg/hr (12/21/18 1634)  . amiodarone    . cefUROXime (ZINACEF)  IV    . dexmedetomidine (PRECEDEX) IV infusion 0.7 mcg/kg/hr (12/21/18 1500)  . epinephrine 5 mcg/min (12/21/18 1500)  . famotidine (PEPCID) IV 100 mL/hr at 12/21/18 1500  . insulin 2.4 mL/hr at 12/21/18 1500  . lactated ringers    . lactated ringers 20 mL/hr at 12/21/18 1500  . lidocaine 1 mg/min (12/21/18 1534)  . magnesium sulfate 4 g (12/21/18 1531)  . milrinone 0.3 mcg/kg/min (12/21/18 1500)  . nitroGLYCERIN Stopped (12/21/18 1447)  . norepinephrine (LEVOPHED) Adult infusion 7 mcg/min (12/21/18 1500)  . phenylephrine (NEO-SYNEPHRINE) Adult infusion 25 mcg/min (12/21/18 1500)  . potassium chloride    . vancomycin      I/O last 3 completed shifts: In: 1301 [P.O.:1298; I.V.:3] Out: 2725 [Urine:2725]   CBC Latest Ref Rng & Units  12/21/2018 12/21/2018 12/21/2018  WBC 4.0 - 10.5 K/uL 20.3(H) - -  Hemoglobin 13.0 - 17.0 g/dL 13.8 11.9(L) 11.2(L)  Hematocrit 39.0 - 52.0 % 41.6 35.0(L) 33.0(L)  Platelets 150 - 400 K/uL 203 - -    BMP Latest Ref Rng & Units 12/21/2018 12/21/2018 12/21/2018  Glucose 70 - 99 mg/dL - 128(H) 116(H)  BUN 6 - 20 mg/dL - - -  Creatinine 0.61 - 1.24 mg/dL - - -  Sodium 135 - 145 mmol/L 138 138 136  Potassium 3.5 - 5.1 mmol/L 4.0 3.9 4.3  Chloride 98 - 111 mmol/L - - -  CO2 22 - 32 mmol/L - - -  Calcium 8.9 - 10.3 mg/dL - - -    ABG    Component Value Date/Time   PHART 7.342 (L) 12/21/2018 1243   PCO2ART 41.8 12/21/2018 1243   PO2ART 98.0 12/21/2018 1243   HCO3 22.7 12/21/2018 1243   TCO2 24 12/21/2018 1243   ACIDBASEDEF 3.0 (H) 12/21/2018 1243   O2SAT 97.0 12/21/2018 1243       Melodie Bouillon, MD 12/21/2018 4:47 PM

## 2018-12-21 NOTE — Progress Notes (Signed)
Pre Procedure note for inpatients:   Jonathan King has been scheduled for Procedure(s): CORONARY ARTERY BYPASS GRAFTING (CABG) (N/A) TRANSESOPHAGEAL ECHOCARDIOGRAM (TEE) (N/A) today. The various methods of treatment have been discussed with the patient. After consideration of the risks, benefits and treatment options the patient has consented to the planned procedure.   The patient has been seen and labs reviewed. There are no changes in the patient's condition to prevent proceeding with the planned procedure today.  Recent labs:  Lab Results  Component Value Date   WBC 11.0 (H) 12/21/2018   HGB 16.2 12/21/2018   HCT 49.2 12/21/2018   PLT 274 12/21/2018   GLUCOSE 118 (H) 12/21/2018   ALT 29 12/17/2018   AST 26 12/17/2018   NA 133 (L) 12/21/2018   K 3.7 12/21/2018   CL 97 (L) 12/21/2018   CREATININE 1.26 (H) 12/21/2018   BUN 19 12/21/2018   CO2 26 12/21/2018   TSH 10.994 (H) 12/18/2018   INR 1.1 12/17/2018   HGBA1C 6.1 (H) 12/21/2018    Len Childs, MD 12/21/2018 7:25 AM

## 2018-12-21 NOTE — Anesthesia Procedure Notes (Signed)
Procedure Name: Intubation Date/Time: 12/21/2018 7:54 AM Performed by: Alain Marion, CRNA Pre-anesthesia Checklist: Patient identified, Emergency Drugs available, Suction available and Patient being monitored Patient Re-evaluated:Patient Re-evaluated prior to induction Oxygen Delivery Method: Circle System Utilized Preoxygenation: Pre-oxygenation with 100% oxygen Induction Type: IV induction Ventilation: Mask ventilation without difficulty Laryngoscope Size: Miller and 2 Tube type: Oral Number of attempts: 1 Airway Equipment and Method: Stylet Placement Confirmation: ETT inserted through vocal cords under direct vision,  positive ETCO2 and breath sounds checked- equal and bilateral Secured at: 22 cm Tube secured with: Tape Dental Injury: Teeth and Oropharynx as per pre-operative assessment

## 2018-12-21 NOTE — Procedures (Signed)
Extubation Procedure Note  Patient Details:   Name: RIDDIK SENNA DOB: 03/04/1966 MRN: [id number]   Airway Documentation:    Vent end date: 12/21/18 Vent end time: 1820   Evaluation  O2 sats: stable throughout Complications: No apparent complications Patient did tolerate procedure well. Bilateral Breath Sounds: Clear   Yes   RT extubated patient to 3L [place] per MD order and rapid wean protocol with RN at bedside. Positive cuff leak noted. NIF -22 and VC 1L. No stridor noted and patient tolerated well. RN working with patient on IS. RT will continue to monitor as needed.   Vernona Rieger 12/21/2018, 6:45 PM

## 2018-12-22 ENCOUNTER — Inpatient Hospital Stay: Payer: Self-pay

## 2018-12-22 ENCOUNTER — Inpatient Hospital Stay (HOSPITAL_COMMUNITY): Payer: Self-pay

## 2018-12-22 ENCOUNTER — Encounter (HOSPITAL_COMMUNITY): Payer: Self-pay | Admitting: Cardiothoracic Surgery

## 2018-12-22 LAB — BPAM FFP
Blood Product Expiration Date: 202009042359
ISSUE DATE / TIME: 202008251352
Unit Type and Rh: 6200

## 2018-12-22 LAB — CBC
HCT: 35.1 % — ABNORMAL LOW (ref 39.0–52.0)
HCT: 35.9 % — ABNORMAL LOW (ref 39.0–52.0)
Hemoglobin: 11.3 g/dL — ABNORMAL LOW (ref 13.0–17.0)
Hemoglobin: 11.9 g/dL — ABNORMAL LOW (ref 13.0–17.0)
MCH: 29.4 pg (ref 26.0–34.0)
MCH: 30.2 pg (ref 26.0–34.0)
MCHC: 32.2 g/dL (ref 30.0–36.0)
MCHC: 33.1 g/dL (ref 30.0–36.0)
MCV: 91.4 fL (ref 80.0–100.0)
MCV: 91.1 fL (ref 80.0–100.0)
Platelets: 192 10*3/uL (ref 150–400)
Platelets: 145 10*3/uL — ABNORMAL LOW (ref 150–400)
RBC: 3.84 MIL/uL — ABNORMAL LOW (ref 4.22–5.81)
RBC: 3.94 MIL/uL — ABNORMAL LOW (ref 4.22–5.81)
RDW: 12.6 % (ref 11.5–15.5)
RDW: 12.7 % (ref 11.5–15.5)
WBC: 12 10*3/uL — ABNORMAL HIGH (ref 4.0–10.5)
WBC: 14.2 10*3/uL — ABNORMAL HIGH (ref 4.0–10.5)
nRBC: 0 % (ref 0.0–0.2)
nRBC: 0 % (ref 0.0–0.2)

## 2018-12-22 LAB — GLUCOSE, CAPILLARY
Glucose-Capillary: 100 mg/dL — ABNORMAL HIGH (ref 70–99)
Glucose-Capillary: 103 mg/dL — ABNORMAL HIGH (ref 70–99)
Glucose-Capillary: 103 mg/dL — ABNORMAL HIGH (ref 70–99)
Glucose-Capillary: 107 mg/dL — ABNORMAL HIGH (ref 70–99)
Glucose-Capillary: 107 mg/dL — ABNORMAL HIGH (ref 70–99)
Glucose-Capillary: 108 mg/dL — ABNORMAL HIGH (ref 70–99)
Glucose-Capillary: 114 mg/dL — ABNORMAL HIGH (ref 70–99)
Glucose-Capillary: 122 mg/dL — ABNORMAL HIGH (ref 70–99)
Glucose-Capillary: 129 mg/dL — ABNORMAL HIGH (ref 70–99)
Glucose-Capillary: 90 mg/dL (ref 70–99)
Glucose-Capillary: 92 mg/dL (ref 70–99)
Glucose-Capillary: 92 mg/dL (ref 70–99)
Glucose-Capillary: 94 mg/dL (ref 70–99)
Glucose-Capillary: 96 mg/dL (ref 70–99)

## 2018-12-22 LAB — BASIC METABOLIC PANEL
Anion gap: 7 (ref 5–15)
Anion gap: 9 (ref 5–15)
BUN: 11 mg/dL (ref 6–20)
BUN: 9 mg/dL (ref 6–20)
CO2: 22 mmol/L (ref 22–32)
CO2: 23 mmol/L (ref 22–32)
Calcium: 7.7 mg/dL — ABNORMAL LOW (ref 8.9–10.3)
Calcium: 8 mg/dL — ABNORMAL LOW (ref 8.9–10.3)
Chloride: 105 mmol/L (ref 98–111)
Chloride: 98 mmol/L (ref 98–111)
Creatinine, Ser: 0.9 mg/dL (ref 0.61–1.24)
Creatinine, Ser: 0.96 mg/dL (ref 0.61–1.24)
GFR calc Af Amer: >60 mL/min (ref >60)
GFR calc Af Amer: >60 mL/min (ref >60)
GFR calc non Af Amer: >60 mL/min (ref >60)
GFR calc non Af Amer: >60 mL/min (ref >60)
Glucose, Bld: 102 mg/dL — ABNORMAL HIGH (ref 70–99)
Glucose, Bld: 119 mg/dL — ABNORMAL HIGH (ref 70–99)
Potassium: 3.9 mmol/L (ref 3.5–5.1)
Potassium: 3.6 mmol/L (ref 3.5–5.1)
Sodium: 134 mmol/L — ABNORMAL LOW (ref 135–145)
Sodium: 130 mmol/L — ABNORMAL LOW (ref 135–145)

## 2018-12-22 LAB — PREPARE FRESH FROZEN PLASMA: Unit division: 0

## 2018-12-22 LAB — COOXEMETRY PANEL
Carboxyhemoglobin: 1 % (ref 0.5–1.5)
Carboxyhemoglobin: 1.4 % (ref 0.5–1.5)
Methemoglobin: 0.6 % (ref 0.0–1.5)
Methemoglobin: 1.1 % (ref 0.0–1.5)
O2 Saturation: 56.5 %
O2 Saturation: 59.3 %
Total hemoglobin: 10.9 g/dL — ABNORMAL LOW (ref 12.0–16.0)
Total hemoglobin: 12.1 g/dL (ref 12.0–16.0)

## 2018-12-22 LAB — POCT I-STAT 7, (LYTES, BLD GAS, ICA,H+H)
Acid-base deficit: 3 mmol/L — ABNORMAL HIGH (ref 0.0–2.0)
Bicarbonate: 22.2 mmol/L (ref 20.0–28.0)
Calcium, Ion: 1.17 mmol/L (ref 1.15–1.40)
HCT: 34 % — ABNORMAL LOW (ref 39.0–52.0)
Hemoglobin: 11.6 g/dL — ABNORMAL LOW (ref 13.0–17.0)
O2 Saturation: 93 %
Potassium: 4 mmol/L (ref 3.5–5.1)
Sodium: 138 mmol/L (ref 135–145)
TCO2: 23 mmol/L (ref 22–32)
pCO2 arterial: 39.7 mmHg (ref 32.0–48.0)
pH, Arterial: 7.355 (ref 7.350–7.450)
pO2, Arterial: 68 mmHg — ABNORMAL LOW (ref 83.0–108.0)

## 2018-12-22 LAB — MAGNESIUM
Magnesium: 2.5 mg/dL — ABNORMAL HIGH (ref 1.7–2.4)
Magnesium: 2 mg/dL (ref 1.7–2.4)

## 2018-12-22 MED ORDER — DIGOXIN 125 MCG PO TABS
0.1250 mg | ORAL_TABLET | Freq: Every day | ORAL | Status: DC
  Administered 2018-12-22 – 2018-12-30 (×9): 0.125 mg via ORAL
  Filled 2018-12-22 (×9): qty 1

## 2018-12-22 MED ORDER — AMIODARONE HCL 200 MG PO TABS: 400.0000 mg | ORAL_TABLET | Freq: Two times a day (BID) | ORAL | Status: DC

## 2018-12-22 MED ORDER — MORPHINE SULFATE (PF) 2 MG/ML IV SOLN: 1.0000 mg | INTRAVENOUS | Status: DC | PRN

## 2018-12-22 MED ORDER — MIDAZOLAM HCL 2 MG/2ML IJ SOLN: 1.0000 mg | INTRAMUSCULAR | Status: DC | PRN

## 2018-12-22 MED ORDER — FUROSEMIDE 10 MG/ML IJ SOLN
20.0000 mg | Freq: Two times a day (BID) | INTRAMUSCULAR | Status: DC
  Administered 2018-12-22: 20 mg via INTRAVENOUS
  Filled 2018-12-22: qty 2

## 2018-12-22 MED ORDER — FUROSEMIDE 10 MG/ML IJ SOLN
40.0000 mg | Freq: Two times a day (BID) | INTRAMUSCULAR | Status: DC
  Administered 2018-12-22 – 2018-12-23 (×2): 40 mg via INTRAVENOUS
  Filled 2018-12-22 (×2): qty 4

## 2018-12-22 MED ORDER — POTASSIUM CHLORIDE 10 MEQ/50ML IV SOLN
10.0000 meq | INTRAVENOUS | Status: AC
  Administered 2018-12-22 (×3): 10 meq via INTRAVENOUS
  Filled 2018-12-22 (×3): qty 50

## 2018-12-22 MED ORDER — INSULIN ASPART 100 UNIT/ML ~~LOC~~ SOLN
0.0000 [IU] | SUBCUTANEOUS | Status: DC
  Administered 2018-12-23: 09:00:00 2 [IU] via SUBCUTANEOUS

## 2018-12-22 MED ORDER — METOCLOPRAMIDE HCL 5 MG/ML IJ SOLN
10.0000 mg | Freq: Four times a day (QID) | INTRAMUSCULAR | Status: DC
  Administered 2018-12-22 – 2018-12-24 (×9): 10 mg via INTRAVENOUS
  Filled 2018-12-22 (×9): qty 2

## 2018-12-22 MED ORDER — MILRINONE LACTATE IN DEXTROSE 20-5 MG/100ML-% IV SOLN
0.1250 ug/kg/min | INTRAVENOUS | Status: DC
  Administered 2018-12-22 – 2018-12-24 (×5): 0.375 ug/kg/min via INTRAVENOUS
  Administered 2018-12-24 – 2018-12-27 (×5): 0.25 ug/kg/min via INTRAVENOUS
  Filled 2018-12-22 (×11): qty 100

## 2018-12-22 NOTE — Plan of Care (Signed)
  Problem: Health Behavior/Discharge Planning: Goal: Ability to manage health-related needs will improve Outcome: Progressing   Problem: Clinical Measurements: Goal: Ability to maintain clinical measurements within normal limits will improve Outcome: Progressing Goal: Will remain free from infection Outcome: Progressing Goal: Diagnostic test results will improve Outcome: Progressing Goal: Respiratory complications will improve Outcome: Progressing Goal: Cardiovascular complication will be avoided Outcome: Progressing   Problem: Activity: Goal: Risk for activity intolerance will decrease Outcome: Progressing   Problem: Safety: Goal: Ability to remain free from injury will improve Outcome: Progressing   Problem: Skin Integrity: Goal: Risk for impaired skin integrity will decrease Outcome: Progressing   Problem: Education: Goal: Ability to demonstrate management of disease process will improve Outcome: Progressing Goal: Ability to verbalize understanding of medication therapies will improve Outcome: Progressing Goal: Individualized Educational Video(s) Outcome: Progressing   Problem: Activity: Goal: Capacity to carry out activities will improve Outcome: Progressing   Problem: Cardiac: Goal: Ability to achieve and maintain adequate cardiopulmonary perfusion will improve Description: Currently diuresing; capillary refill is warm to touch. Outcome: Progressing

## 2018-12-22 NOTE — Addendum Note (Signed)
Addendum  created 12/22/18 1147 by Murvin Natal, MD   Order list changed

## 2018-12-22 NOTE — Progress Notes (Signed)
1 Day Post-Op Procedure(s) (LRB): CORONARY ARTERY BYPASS GRAFTING (CABG) x 3, USING LEFT INTERNAL MAMMARY ARTERY (LIMA) AND RIGHT GREATER SAPHENOUS VEIN (SVG) HARVESTED ENDOSCOPICALLY;    LIMA-LAD, SVG-RAMUS, SVG-DIAG (N/A) TRANSESOPHAGEAL ECHOCARDIOGRAM (TEE) (N/A) Subjective: Extubated, nsr On low dose inotropes for low EF  Objective: Vital signs in last 24 hours: Temp:  [96.3 F (35.7 C)-99.3 F (37.4 C)] 98.8 F (37.1 C) (08/26 0756) Pulse Rate:  [30-96] 92 (08/26 0756) Cardiac Rhythm: Normal sinus rhythm (08/26 0730) Resp:  [12-28] 27 (08/26 0756) BP: (100-126)/(56-95) 105/82 (08/26 0745) SpO2:  [89 %-100 %] 96 % (08/26 0756) Arterial Line BP: (71-134)/(46-81) 111/66 (08/26 0756) FiO2 (%):  [40 %-50 %] 40 % (08/25 1744) Weight:  [94.5 kg] 94.5 kg (08/26 0500)  Hemodynamic parameters for last 24 hours: PAP: (28-49)/(8-26) 39/20 CO:  [4.4 L/min-6 L/min] 5.5 L/min CI:  [2.1 L/min/m2-2.9 L/min/m2] 2.7 L/min/m2  Intake/Output from previous day: 08/25 0701 - 08/26 0700 In: 7386.5 [P.O.:150; I.V.:4265.6; Blood:1021; IV Piggyback:1950] Out: 3460 [Urine:2110; Blood:740; Chest Tube:610] Intake/Output this shift: Total I/O In: -  Out: 160 [Urine:110; Chest Tube:50]       Exam    General- alert and comfortable    Neck- no JVD, no cervical adenopathy palpable, no carotid bruit   Lungs- clear without rales, wheezes   Cor- regular rate and rhythm, no murmur , gallop   Abdomen- soft, non-tender   Extremities - warm, non-tender, minimal edema   Neuro- oriented, appropriate, no focal weakness  Lab Results: Recent Labs    12/21/18 2108 12/22/18 0358 12/22/18 0408  WBC 14.5* 12.0*  --   HGB 11.9* 11.3* 11.6*  HCT 36.6* 35.1* 34.0*  PLT 192 192  --    BMET:  Recent Labs    12/21/18 2108 12/22/18 0358 12/22/18 0408  NA 136 134* 138  K 3.7 3.9 4.0  CL 106 105  --   CO2 22 22  --   GLUCOSE 150* 102*  --   BUN 13 11  --   CREATININE 0.96 0.90  --   CALCIUM 7.8* 7.7*   --     PT/INR:  Recent Labs    12/21/18 1455  LABPROT 17.0*  INR 1.4*   ABG    Component Value Date/Time   PHART 7.355 12/22/2018 0408   HCO3 22.2 12/22/2018 0408   TCO2 23 12/22/2018 0408   ACIDBASEDEF 3.0 (H) 12/22/2018 0408   O2SAT 56.5 12/22/2018 0408   O2SAT 93.0 12/22/2018 0408   CBG (last 3)  Recent Labs    12/22/18 0303 12/22/18 0403 12/22/18 0509  GLUCAP 100* 96 92    Assessment/Plan: S/P Procedure(s) (LRB): CORONARY ARTERY BYPASS GRAFTING (CABG) x 3, USING LEFT INTERNAL MAMMARY ARTERY (LIMA) AND RIGHT GREATER SAPHENOUS VEIN (SVG) HARVESTED ENDOSCOPICALLY;    LIMA-LAD, SVG-RAMUS, SVG-DIAG (N/A) TRANSESOPHAGEAL ECHOCARDIOGRAM (TEE) (N/A) Mobilize Diuresis d/c tubes/lines See progression orders   LOS: 11 days    Tharon Aquas Trigt III 12/22/2018

## 2018-12-22 NOTE — Progress Notes (Addendum)
Progress Note  Patient Name: Jonathan King Date of Encounter: 12/22/2018  Primary Cardiologist: Dr. Debara Pickett Heart Failure: Dr. Haroldine Laws   Subjective   Remains in ICU. POD#1 s/p CABG. Extubated.  A&Ox3. No complaints this am.    Inpatient Medications    Scheduled Meds:  acetaminophen  1,000 mg Oral Q6H   Or   acetaminophen (TYLENOL) oral liquid 160 mg/5 mL  1,000 mg Per Tube Q6H   aspirin EC  325 mg Oral Daily   Or   aspirin  324 mg Per Tube Daily   atorvastatin  80 mg Oral q1800   bisacodyl  10 mg Oral Daily   Or   bisacodyl  10 mg Rectal Daily   Chlorhexidine Gluconate Cloth  6 each Topical Daily   docusate sodium  200 mg Oral Daily   furosemide  20 mg Intravenous BID   insulin aspart  0-24 Units Subcutaneous Q4H   metoCLOPramide (REGLAN) injection  10 mg Intravenous Q6H   [START ON 12/23/2018] pantoprazole  40 mg Oral Daily   sodium chloride flush  10-40 mL Intracatheter Q12H   sodium chloride flush  3 mL Intravenous Q12H   Continuous Infusions:  sodium chloride 20 mL/hr at 12/22/18 0700   sodium chloride     sodium chloride 20 mL/hr at 12/21/18 1445   albumin human Stopped (12/21/18 2000)   amiodarone 30 mg/hr (12/22/18 0850)   cefUROXime (ZINACEF)  IV Stopped (12/22/18 0544)   epinephrine 2 mcg/min (12/22/18 0700)   lactated ringers     lactated ringers 20 mL/hr at 12/22/18 0700   milrinone 0.25 mcg/kg/min (12/22/18 0849)   norepinephrine (LEVOPHED) Adult infusion 6 mcg/min (12/22/18 0700)   PRN Meds: sodium chloride, albumin human, metoprolol tartrate, midazolam, morphine injection, ondansetron (ZOFRAN) IV, oxyCODONE, sodium chloride flush, sodium chloride flush, traMADol   Vital Signs    Vitals:   12/22/18 0700 12/22/18 0730 12/22/18 0745 12/22/18 0756  BP: 113/81 111/78 105/82   Pulse: 86 87 89 92  Resp: 15 (!) 21 (!) 21 (!) 27  Temp: 99.3 F (37.4 C) 99.1 F (37.3 C) 98.8 F (37.1 C) 98.8 F (37.1 C)  TempSrc:    Core  (Comment)  SpO2: 98% 96% 95% 96%  Weight:      Height:        Intake/Output Summary (Last 24 hours) at 12/22/2018 0918 Last data filed at 12/22/2018 0730 Gross per 24 hour  Intake 7136.52 ml  Output 3545 ml  Net 3591.52 ml   Last 3 Weights 12/22/2018 12/21/2018 12/20/2018  Weight (lbs) 208 lb 5.4 oz 188 lb 6.4 oz 191 lb 4.8 oz  Weight (kg) 94.5 kg 85.458 kg 86.773 kg      Telemetry    NSR with PVCs - Personally Reviewed  ECG    NSR, LAD, incomplete LBBB, prolonged QT 412/515 ms - Personally Reviewed  Physical Exam   GEN: middle aged WM in ICU on multiple drips, on [place], in No acute distress.   Neck: No JVD Cardiac: RRR, no murmurs, rubs, or gallops.  Respiratory: Clear to auscultation bilaterally. GI: Soft, nontender, non-distended  MS: No edema; No deformity. Neuro:  Nonfocal  Psych: Normal affect   Labs    High Sensitivity Troponin:   Recent Labs  Lab 12/10/18 0248 12/10/18 0419 12/10/18 1154 12/10/18 1302  TROPONINIHS 42* 46* 41* 41*      Chemistry Recent Labs  Lab 12/16/18 2129 12/17/18 0426  12/21/18 0358  12/21/18 1456  12/21/18 2108 12/22/18  7510 12/22/18 0408  NA 132* 134*   < > 133*   < > 138   < > 136 134* 138  K 4.2 4.2   < > 3.7   < > 4.0   < > 3.7 3.9 4.0  CL 101 105   < > 97*  --   --   --  106 105  --   CO2 22 21*   < > 26  --   --   --  22 22  --   GLUCOSE 108* 121*   < > 118*   < > 180*  --  150* 102*  --   BUN 22* 21*   < > 19  --   --   --  13 11  --   CREATININE 1.09 1.12   < > 1.26*  --   --   --  0.96 0.90  --   CALCIUM 8.7* 8.3*   < > 9.0  --   --   --  7.8* 7.7*  --   PROT  --  6.7  --   --   --   --   --   --   --   --   ALBUMIN 3.4* 3.2*  --   --   --   --   --   --   --   --   AST  --  26  --   --   --   --   --   --   --   --   ALT  --  29  --   --   --   --   --   --   --   --   ALKPHOS  --  65  --   --   --   --   --   --   --   --   BILITOT  --  0.8  --   --   --   --   --   --   --   --   GFRNONAA >60 >60   < > >60  --    --   --  >60 >60  --   GFRAA >60 >60   < > >60  --   --   --  >60 >60  --   ANIONGAP 9 8   < > 10  --   --   --  8 7  --    < > = values in this interval not displayed.     Hematology Recent Labs  Lab 12/21/18 1455  12/21/18 2108 12/22/18 0358 12/22/18 0408  WBC 20.3*  --  14.5* 12.0*  --   RBC 4.60  --  4.01* 3.84*  --   HGB 13.8   < > 11.9* 11.3* 11.6*  HCT 41.6   < > 36.6* 35.1* 34.0*  MCV 90.4  --  91.3 91.4  --   MCH 30.0  --  29.7 29.4  --   MCHC 33.2  --  32.5 32.2  --   RDW 12.7  --  12.6 12.6  --   PLT 203  --  192 192  --    < > = values in this interval not displayed.    BNPNo results for input(s): BNP, PROBNP in the last 168 hours.   DDimer No results for input(s): DDIMER in the last 168 hours.   Radiology  Dg Chest Port 1 View  Result Date: 12/22/2018 CLINICAL DATA:  Postoperative CABG, CHF EXAM: PORTABLE CHEST 1 VIEW COMPARISON:  12/21/2018 FINDINGS: Interval removal of endotracheal and enteric tubes. A right IJ Swan-Ganz catheter terminates at the expected main pulmonary trunk. Left-sided chest tube and mediastinal drain remain in place. Median sternotomy wires. Heart size remains mildly enlarged. Improving aeration of the lung bases. Mild streaky right basilar opacity, likely atelectasis. No pleural effusion. No discernible pneumothorax. IMPRESSION: 1. Interval removal of endotracheal and enteric tubes. 2. Remaining support apparatus in stable positioning. No discernible pneumothorax. 3. Improving aeration of the lung bases with persistent right basilar atelectasis. Electronically Signed   By: Davina Poke M.D.   On: 12/22/2018 08:22   Dg Chest Port 1 View  Result Date: 12/21/2018 CLINICAL DATA:  Status post coronary bypass graft. EXAM: PORTABLE CHEST 1 VIEW COMPARISON:  Radiographs of same day. FINDINGS: Stable cardiomediastinal silhouette. Endotracheal tube is in good position. Interval placement of nasogastric tube, distal tip of which is not visualized.  Left-sided chest tube is noted without pneumothorax. Right internal jugular Swan-Ganz catheter is noted with tip directed toward right pulmonary artery. Stable bibasilar atelectasis is noted with probable small pleural effusions. Bony thorax is unremarkable. IMPRESSION: Endotracheal tube in grossly good position. Left-sided chest tube is noted without pneumothorax. Stable bibasilar atelectasis is noted with associated pleural effusions. Interval placement of nasogastric tube, although the distal tip is not visualized due to radiographic technique. Electronically Signed   By: Marijo Conception M.D.   On: 12/21/2018 15:31   Dg Chest Port 1 View  Result Date: 12/21/2018 CLINICAL DATA:  Pneumothorax. EXAM: PORTABLE CHEST 1 VIEW COMPARISON:  CT 12/17/2018.  Chest x-ray 05/17/2018. FINDINGS: Endotracheal tube, mediastinal drainage catheter, Swan-Ganz catheter, left chest tube in good anatomic position. Mild bibasilar atelectasis/infiltrates. Prior CABG. No pulmonary venous congestion. IMPRESSION: 1. Lines and tubes including left chest tube in good anatomic position. No pneumothorax. 2.  Prior CABG.  Cardiomegaly. 3.  Bibasilar atelectasis/infiltrates. Electronically Signed   By: [place]   On: 12/21/2018 14:54    Cardiac Studies   R/LHC 12/16/18 RIGHT/LEFT HEART CATH AND CORONARY ANGIOGRAPHY  Conclusion    RPDA lesion is 70% stenosed.  Ost LAD to Prox LAD lesion is 99% stenosed.  Prox LAD to Mid LAD lesion is 100% stenosed.  1st Diag lesion is 99% stenosed.  Ramus lesion is 90% stenosed.   Findings:  Ao = 88/51 (68) LV = 86/17 RA = 3 RV = 46/5 PA = 45/10 (23) PCW = 17 Fick cardiac output/index = 4.2/2.0 PVR = 1.5 WU Ao sat = 98% PA sat = 67%, 68%  Assessment: 1. 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 Well-compensated filling pressures with moderately reduced CO  Plan/Discussion:  He has severe CAD with  ischemic CM but I suspect EF also made worse by frequent PVCs. Will d/w TCTC regarding possible CABG. Will also need repeat imaging +/- biopsy or LLL pulmonary nodule.    2D Echo 12/11/18 1. The left ventricle has a visually estimated ejection fraction of 15-20%. The cavity size was moderately dilated. Findings are consistent with dilated cardiomyopathy. Left ventricular diastolic Doppler parameters are consistent with restrictive filling. Elevated left atrial and left ventricular end-diastolic pressures Left ventricular diffuse hypokinesis. 2. The right ventricle has moderately reduced systolic function. The cavity was normal. There is no increase in right ventricular wall thickness. 3. Left atrial size  was mildly dilated. 4. The mitral valve is abnormal. Mild thickening of the mitral valve leaflet. Mitral valve regurgitation is moderate by color flow Doppler. 5. The tricuspid valve is grossly normal. 6. The aortic valve is abnormal. Mild thickening of the aortic valve. Aortic valve regurgitation is trivial by color flow Doppler. No stenosis of the aortic valve. 7. The aorta is normal unless otherwise noted. 8. The inferior vena cava was dilated in size with <50% respiratory variability.  Patient Profile     53 y.o. male w/ no PMH other than cholecystectomy in 2013, who presented w/ DOE, orthopnea, LEE and weight gain. Found to be in acute CHF. Echo showed severely reduced LVEF, 15-20%. Cath showed severe 3V CAD s/p CABG 12/21/18.  Additional hospital medical problems include high burden PVCs and new diagnosis of a LLL nodule.   Assessment & Plan    1. CAD: severe native vessel CAD s/p CABG x 3 w/ left internal mammary artery to left anterior descending, saphenous vein graft to diagonal, saphenous vein graft to ramus intermedius. POD#1. CP free. Post op care per CT surgery. Plan medical therapy for secondary prevention. On ASA and high dose statin. Baseline LDL unknown. Will check FLP in the  am. Target LDL goal < 70 mg/dL. No  blocker due to current pressor support.   2. Ischemic Cardiomyopathy: EF 15-20% by recent echo, in the setting of severe 3V CAD. Also ? Possible tachymediated CM due to high burden PVCs. He is now s/p CABG and on IV amiodarone for PVC suppression. Hopefully EF will improve post revascularization and suppression of ventricular ectopy. Currently not on guidelines directed medical therapy given hypotension post surgery requiring pressor support. Will try to initiate once off pressors and if BP allows.  Will need repeat echo in several weeks. If no improvement in LVEF to > 35%, he will ultimately need referral to EP for ICD consideration for primary prevention.   3. Acute Systolic HF: presented w/ SOB, orthopnea and LEE. Initial BNP on admit was 1,200. Treated w/ IV diuretics. Volume and respiratory status improved. Net I/Os since admit -7.6L. No peripheral edema on exam. On 4L [place], O2 sats 97%. He remains on multiple IV drips in ICU. Continue IV Lasix and continue to monitor volume post operatively. Continue strict I/Os. SCr and K both stable and WNL.   4. High PVC Burden: ~ 20% burden initially. Suspect likely contributing to cardiomyopathy. Continue on amiodarone for suppression. Keep electrolytes stable. K > 4.0 and Mg > 2.0. Monitor QT. 412/515 ms  5. LLL Nodule: new finding noted on chest CT 8/17. Concern for lung CA vs infection. Will need re-imaging in 3 weeks.   For questions or updates, please contact [place] Please consult [email or web address] for contact info under    Signed, Lyda Jester, PA-C  12/22/2018, 9:18 AM    Patient seen with PA, agree with the above note.    Luiz Blare numbers (personally reviewed):  PA 48/24 CI 1.7 Co-ox 56.5%  He remains on norepinephrine 2, epinephrine 2, milrinone 0.25.    He is awake/alert, complains of chest soreness.   General: NAD Neck: JVP 14 cm, no thyromegaly or thyroid nodule.  Lungs: Rhonchi  bilaterally CV: Nondisplaced PMI.  Heart regular S1/S2, no S3/S4, no murmur.  No peripheral edema.  Abdomen: Soft, nontender, no hepatosplenomegaly, no distention.  Skin: Intact without lesions or rashes.  Neurologic: Alert and oriented x 3.  Psych: Normal affect. Extremities: No clubbing or cyanosis.  HEENT: Normal.  BP excellent and weaning pressor support, but cardiac output somewhat low from [place] and co-ox.   - Increase milrinone to 0.375 mcg/kg/min.  - Add digoxin 0.125.  - Repeat co-ox and Swan numbers this afternoon. Would leave swan in today.   Volume overload on exam, CVP not set up but PADP elevated. Creatinine stable.  - Start with Lasix 40 mg IV bid, may need to increase but will follow response.  - Set up CVP.   Frequent PVCs may contribute to cardiomyopathy.  Continue amiodarone gtt for now.   CRITICAL CARE Performed by: Loralie Champagne  Total critical care time: 35 minutes  Critical care time was exclusive of separately billable procedures and treating other patients.  Critical care was necessary to treat or prevent imminent or life-threatening deterioration.  Critical care was time spent personally by me on the following activities: development of treatment plan with patient and/or surrogate as well as nursing, discussions with consultants, evaluation of patient's response to treatment, examination of patient, obtaining history from patient or surrogate, ordering and performing treatments and interventions, ordering and review of laboratory studies, ordering and review of radiographic studies, pulse oximetry and re-evaluation of patient's condition.   Loralie Champagne 12/22/2018 11:02 AM

## 2018-12-22 NOTE — Op Note (Signed)
NAME: Jonathan King, Jonathan King MEDICAL RECORD ZO:[id number] ACCOUNT [id number] DATE OF BIRTH:1965-10-19 FACILITY: [facility] LOCATION: [facility] PHYSICIAN:Carolene Gitto VAN TRIGT III, MD  OPERATIVE REPORT  DATE OF PROCEDURE:  12/21/2018  OPERATION: 1.  Coronary artery bypass grafting x3 (left internal mammary artery to left anterior descending, saphenous vein graft to diagonal, saphenous vein graft to ramus intermedius). 2.  Endoscopic harvest of right leg greater saphenous vein.  SURGEON:  Kerin Perna, MD  ASSISTANT:  Gershon Crane, PA-C  ANESTHESIA:  General by Karna Christmas, MD  PREOPERATIVE DIAGNOSES:  Ischemic cardiomyopathy, severe multivessel coronary artery disease, ejection fraction 15-20%.  POSTOPERATIVE DIAGNOSES:  Ischemic cardiomyopathy, severe multivessel coronary artery disease, ejection fraction 15-20%.  CLINICAL NOTE:  The patient is a 53 year old nondiabetic male who presented to the hospital system with symptoms of heart failure.  An echo showed severe LV dysfunction with EF 15-20%.  He underwent cardiac catheterization by Dr. Gala Romney which  demonstrated severe multivessel coronary disease with moderately elevated right-sided pressures but compensated cardiac output.  He was recommended for coronary artery bypass grafting.  I discussed the procedure of coronary artery bypass grafting with  the patient including the expected benefits, alternatives, and risks.  He understood the risks of stroke, bleeding, infection, organ failure, postoperative pulmonary problems including pleural effusion, postoperative organ failure, and death.  He  understood that he would have general anesthesia and would be placed on cardiopulmonary bypass and have a sternal incision.  He understood his recovery time would be approximately 1 week, but it could be extended if he had a postoperative complication.   He agreed to proceed with surgery under what I felt was an informed consent.  OPERATIVE FINDINGS: 1.   Adequate conduit. 2.  Severe LV dilatation and global LV dysfunction. 3.  Moderate 2+ mitral regurgitation. 4.  No blood products required for the surgery. 5.  The patient separated from cardiopulmonary bypass successfully on low-dose inotropic support.  DESCRIPTION OF PROCEDURE:  The patient was brought from preop holding to the operating room after informed consent was documented and final questions addressed.  The patient was placed supine on the operating table and general anesthesia was induced.  He  remained stable.  While under anesthesia, the central lines and Swan catheter were placed since the patient was intolerant of having the procedures done while awake in preop holding.  The patient was then prepped and draped and positioned.  A proper  time-out was performed.  A sternal incision was made as the saphenous vein was harvested endoscopically from the right leg.  The left internal mammary artery was harvested as a pedicle graft from its origin at the subclavian vessels.  It was a 1.5 mm  vessel with good flow.  The sternal retractor was placed and the pericardium opened and suspended.  The heart was inspected.  It was dilated.  It was diffusely hypocontractile.  There was minimal scarring.  Pursestrings were placed in the ascending aorta and right atrium, and  after heparin was administered, the patient was cannulated and placed on cardiopulmonary bypass.  The coronaries were identified for grafting, and the LAD, diagonal, and ramus were found to be adequate targets.  The ramus was intramyocardial.  The  patient was then cooled as cardioplegia cannulas were placed for antegrade aortic and retrograde coronary sinus cardioplegia.  The cross-clamp was applied, and a liter of cold blood cardioplegia was delivered in split doses between the 2 cardioplegic  cannulas.  There was good cardioplegic arrest, and supple temperature dropped  less than 14 degrees.  Cardioplegia was delivered every 20  minutes.  The distal coronary anastomoses were performed.  The first distal anastomosis was the ramus intermedius.  This had a proximal 95% stenosis.  A reverse saphenous vein was sewn to this intramyocardial vessel with running 7-0 Prolene.  There was good flow  through the graft.  A second distal anastomosis was to the diagonal branch of the LAD.  This is a 1.5 mm vessel, proximal ostial 80-90% stenosis.  A reverse saphenous vein was sewn end-to-side with running 7-0 Prolene with good flow through the graft.  Cardioplegia was  redosed.  The third distal anastomosis was the distal LAD.  It was totally occluded proximally.  The left IMA pedicle was brought through an opening in the left lateral pericardium and was brought down onto the LAD and sewn end-to-side with running 8-0 Prolene.   There was good flow through the anastomosis after briefly releasing the pedicle bulldog and the mammary artery.  The bulldog was reapplied, and the pedicle was secured to the epicardium.  Cardioplegia was redosed.  While the cross-clamp was in place, 2 proximal vein anastomoses were performed on the ascending aorta using a 4.5 mm punch and running 6-0 Prolene.  Prior to tying down the final proximal anastomosis, air was vented from the coronaries with a dose of  retrograde warm blood cardioplegia.  The cross-clamp was removed.  The heart resumed a spontaneous rhythm.  The vein grafts were deaired.  The proximal and distal anastomoses were checked and hemostasis was documented.  The temporary pacing wires were applied.  The patient was rewarmed.  The lungs were expanded and  ventilator was resumed.  The patient was started on low-dose inotropic support and weaned successfully off cardiopulmonary bypass.  Echo showed slight improvement in LV global function.  Protamine was administered without adverse reaction.  The cannulas were removed.  The  mediastinum was irrigated.  The superior pericardial fat was closed  over the aorta.  The anterior mediastinum and left pleural chest tubes were placed and brought out through separate incisions.  The sternum was closed with wire.  The pectoralis fascia  was closed with a running #1 Vicryl.  Subcutaneous and skin layers were closed with running Vicryl, and sterile dressings were applied.  Total cardiopulmonary bypass time was 120 minutes.  The patient was then transported back to the ICU intubated in  stable hemodynamic condition.  LN/NUANCE  D:12/21/2018 T:12/21/2018 JOB:[id number]

## 2018-12-23 ENCOUNTER — Inpatient Hospital Stay: Payer: Self-pay

## 2018-12-23 ENCOUNTER — Inpatient Hospital Stay (HOSPITAL_COMMUNITY): Payer: Self-pay

## 2018-12-23 LAB — BASIC METABOLIC PANEL
Anion gap: 8 (ref 5–15)
Anion gap: 11 (ref 5–15)
BUN: 9 mg/dL (ref 6–20)
BUN: 10 mg/dL (ref 6–20)
CO2: 24 mmol/L (ref 22–32)
CO2: 25 mmol/L (ref 22–32)
Calcium: 8.1 mg/dL — ABNORMAL LOW (ref 8.9–10.3)
Calcium: 8.1 mg/dL — ABNORMAL LOW (ref 8.9–10.3)
Chloride: 98 mmol/L (ref 98–111)
Chloride: 92 mmol/L — ABNORMAL LOW (ref 98–111)
Creatinine, Ser: 0.86 mg/dL (ref 0.61–1.24)
Creatinine, Ser: 0.81 mg/dL (ref 0.61–1.24)
GFR calc Af Amer: >60 mL/min (ref >60)
GFR calc Af Amer: >60 mL/min (ref >60)
GFR calc non Af Amer: >60 mL/min (ref >60)
GFR calc non Af Amer: >60 mL/min (ref >60)
Glucose, Bld: 128 mg/dL — ABNORMAL HIGH (ref 70–99)
Glucose, Bld: 197 mg/dL — ABNORMAL HIGH (ref 70–99)
Potassium: 4 mmol/L (ref 3.5–5.1)
Potassium: 4.2 mmol/L (ref 3.5–5.1)
Sodium: 130 mmol/L — ABNORMAL LOW (ref 135–145)
Sodium: 128 mmol/L — ABNORMAL LOW (ref 135–145)

## 2018-12-23 LAB — CBC
HCT: 36.9 % — ABNORMAL LOW (ref 39.0–52.0)
Hemoglobin: 11.8 g/dL — ABNORMAL LOW (ref 13.0–17.0)
MCH: 29.5 pg (ref 26.0–34.0)
MCHC: 32 g/dL (ref 30.0–36.0)
MCV: 92.3 fL (ref 80.0–100.0)
Platelets: 144 10*3/uL — ABNORMAL LOW (ref 150–400)
RBC: 4 MIL/uL — ABNORMAL LOW (ref 4.22–5.81)
RDW: 12.8 % (ref 11.5–15.5)
WBC: 13.6 10*3/uL — ABNORMAL HIGH (ref 4.0–10.5)
nRBC: 0 % (ref 0.0–0.2)

## 2018-12-23 LAB — LIPID PANEL
Cholesterol: 48 mg/dL (ref 0–200)
HDL: 25 mg/dL — ABNORMAL LOW (ref >40)
LDL Cholesterol: 12 mg/dL (ref 0–99)
Total CHOL/HDL Ratio: 1.9 RATIO
Triglycerides: 53 mg/dL (ref <150)
VLDL: 11 mg/dL (ref 0–40)

## 2018-12-23 LAB — GLUCOSE, CAPILLARY
Glucose-Capillary: 20 mg/dL — CL (ref 70–99)
Glucose-Capillary: 103 mg/dL — ABNORMAL HIGH (ref 70–99)
Glucose-Capillary: 125 mg/dL — ABNORMAL HIGH (ref 70–99)
Glucose-Capillary: 124 mg/dL — ABNORMAL HIGH (ref 70–99)
Glucose-Capillary: 188 mg/dL — ABNORMAL HIGH (ref 70–99)
Glucose-Capillary: 117 mg/dL — ABNORMAL HIGH (ref 70–99)

## 2018-12-23 LAB — MAGNESIUM: Magnesium: 2.1 mg/dL (ref 1.7–2.4)

## 2018-12-23 LAB — COOXEMETRY PANEL
Carboxyhemoglobin: 0.9 % (ref 0.5–1.5)
Carboxyhemoglobin: 1.4 % (ref 0.5–1.5)
Methemoglobin: 0.6 % (ref 0.0–1.5)
Methemoglobin: 1.1 % (ref 0.0–1.5)
O2 Saturation: 39.5 %
O2 Saturation: 50.1 %
Total hemoglobin: 11.2 g/dL — ABNORMAL LOW (ref 12.0–16.0)
Total hemoglobin: 11.4 g/dL — ABNORMAL LOW (ref 12.0–16.0)

## 2018-12-23 MED ORDER — SIMETHICONE 80 MG PO CHEW
80.0000 mg | CHEWABLE_TABLET | Freq: Four times a day (QID) | ORAL | Status: DC
  Administered 2018-12-23 – 2018-12-30 (×28): 80 mg via ORAL
  Filled 2018-12-23 (×26): qty 1

## 2018-12-23 MED ORDER — SODIUM CHLORIDE 0.9% FLUSH
10.0000 mL | Freq: Two times a day (BID) | INTRAVENOUS | Status: DC
  Administered 2018-12-23: 40 mL
  Administered 2018-12-23: 21:00:00 20 mL
  Administered 2018-12-24 – 2018-12-29 (×7): 10 mL
  Administered 2018-12-29: 20 mL
  Administered 2018-12-30: 10 mL

## 2018-12-23 MED ORDER — FUROSEMIDE 10 MG/ML IJ SOLN: 80.0000 mg | Freq: Two times a day (BID) | INTRAMUSCULAR | Status: DC

## 2018-12-23 MED ORDER — INSULIN ASPART 100 UNIT/ML ~~LOC~~ SOLN
0.0000 [IU] | Freq: Three times a day (TID) | SUBCUTANEOUS | Status: DC
  Administered 2018-12-23: 3 [IU] via SUBCUTANEOUS
  Administered 2018-12-23 – 2018-12-27 (×3): 2 [IU] via SUBCUTANEOUS

## 2018-12-23 MED ORDER — CHLORHEXIDINE GLUCONATE CLOTH 2 % EX PADS: 6.0000 | MEDICATED_PAD | Freq: Every day | CUTANEOUS | Status: DC

## 2018-12-23 MED ORDER — FUROSEMIDE 10 MG/ML IJ SOLN
60.0000 mg | Freq: Two times a day (BID) | INTRAMUSCULAR | Status: DC
  Administered 2018-12-23: 60 mg via INTRAVENOUS
  Filled 2018-12-23 (×2): qty 6

## 2018-12-23 MED ORDER — AMIODARONE IV BOLUS ONLY 150 MG/100ML
150.0000 mg | Freq: Once | INTRAVENOUS | Status: AC
  Administered 2018-12-23: 05:00:00 150 mg via INTRAVENOUS

## 2018-12-23 MED ORDER — ENOXAPARIN SODIUM 40 MG/0.4ML ~~LOC~~ SOLN
40.0000 mg | SUBCUTANEOUS | Status: DC
  Administered 2018-12-23 – 2018-12-30 (×8): 40 mg via SUBCUTANEOUS
  Filled 2018-12-23 (×8): qty 0.4

## 2018-12-23 MED ORDER — POTASSIUM CHLORIDE 10 MEQ/50ML IV SOLN
10.0000 meq | INTRAVENOUS | Status: AC
  Administered 2018-12-23 (×2): 10 meq via INTRAVENOUS
  Filled 2018-12-23 (×2): qty 50

## 2018-12-23 MED ORDER — SPIRONOLACTONE 12.5 MG HALF TABLET
12.5000 mg | ORAL_TABLET | Freq: Every day | ORAL | Status: DC
  Administered 2018-12-23 – 2018-12-27 (×5): 12.5 mg via ORAL
  Filled 2018-12-23 (×5): qty 1

## 2018-12-23 MED ORDER — INSULIN ASPART 100 UNIT/ML ~~LOC~~ SOLN: 0.0000 [IU] | Freq: Every day | SUBCUTANEOUS | Status: DC

## 2018-12-23 MED ORDER — SODIUM CHLORIDE 0.9% FLUSH: 10.0000 mL | INTRAVENOUS | Status: DC | PRN

## 2018-12-23 MED ORDER — AMIODARONE IV BOLUS ONLY 150 MG/100ML
INTRAVENOUS | Status: AC
  Administered 2018-12-23: 05:00:00 150 mg via INTRAVENOUS
  Filled 2018-12-23: qty 100

## 2018-12-23 MED ORDER — SODIUM CHLORIDE 0.9 % IV SOLN
INTRAVENOUS | Status: DC | PRN
  Administered 2018-12-23: 250 mL via INTRAVENOUS

## 2018-12-23 NOTE — Progress Notes (Signed)
Writer called and spoke with pt's nurse regarding removal of introducer as ordered. Pt's nurse reported it has already been removed.

## 2018-12-23 NOTE — Progress Notes (Signed)
2 Days Post-Op Procedure(s) (LRB): CORONARY ARTERY BYPASS GRAFTING (CABG) x 3, USING LEFT INTERNAL MAMMARY ARTERY (LIMA) AND RIGHT GREATER SAPHENOUS VEIN (SVG) HARVESTED ENDOSCOPICALLY;    LIMA-LAD, SVG-RAMUS, SVG-DIAG (N/A) TRANSESOPHAGEAL ECHOCARDIOGRAM (TEE) (N/A) Subjective: NSR after brief episode afib, on iv amiodarone Positive air leak from chest tubes- will leave in place Cardiac index 2.0- off epi and norepi, cont milrinone DC swan after PICC and up to chair  Objective: Vital signs in last 24 hours: Temp:  [97.5 F (36.4 C)-99 F (37.2 C)] 97.9 F (36.6 C) (08/27 0800) Pulse Rate:  [33-101] 85 (08/27 0800) Cardiac Rhythm: Atrial fibrillation (08/27 0409) Resp:  [11-28] 19 (08/27 0800) BP: (89-130)/(61-112) 119/89 (08/27 0800) SpO2:  [91 %-99 %] 92 % (08/27 0800) Arterial Line BP: (84-92)/(78-86) 89/86 (08/26 1100) Weight:  [94.5 kg] 94.5 kg (08/27 0500)  Hemodynamic parameters for last 24 hours: PAP: (24-50)/(10-35) 44/21 CVP:  [10 mmHg-14 mmHg] 10 mmHg CO:  [3.6 L/min-4.8 L/min] 4.5 L/min CI:  [1.7 L/min/m2-2.3 L/min/m2] 2.2 L/min/m2  Intake/Output from previous day: 08/26 0701 - 08/27 0700 In: 2363.6 [P.O.:240; I.V.:1527.6; IV Piggyback:596] Out: 5830 [Urine:2991; Chest Tube:490] Intake/Output this shift: Total I/O In: -  Out: 60 [Urine:50; Chest Tube:10]       Exam    General- alert and comfortable    Neck- no JVD, no cervical adenopathy palpable, no carotid bruit   Lungs- clear without rales, wheezes   Cor- regular rate and rhythm, no murmur , gallop   Abdomen- soft, non-tender   Extremities - warm, non-tender, minimal edema   Neuro- oriented, appropriate, no focal weakness   Lab Results: Recent Labs    12/22/18 1814 12/23/18 0413  WBC 14.2* 13.6*  HGB 11.9* 11.8*  HCT 35.9* 36.9*  PLT 145* 144*   BMET:  Recent Labs    12/22/18 1814 12/23/18 0413  NA 130* 130*  K 3.6 4.0  CL 98 98  CO2 23 24  GLUCOSE 119* 128*  BUN 9 9  CREATININE  0.96 0.86  CALCIUM 8.0* 8.1*    PT/INR:  Recent Labs    12/21/18 1455  LABPROT 17.0*  INR 1.4*   ABG    Component Value Date/Time   PHART 7.355 12/22/2018 0408   HCO3 22.2 12/22/2018 0408   TCO2 23 12/22/2018 0408   ACIDBASEDEF 3.0 (H) 12/22/2018 0408   O2SAT 50.1 12/23/2018 0715   CBG (last 3)  Recent Labs    12/22/18 2344 12/23/18 0343 12/23/18 0745  GLUCAP 103* 103* 125*    Assessment/Plan: S/P Procedure(s) (LRB): CORONARY ARTERY BYPASS GRAFTING (CABG) x 3, USING LEFT INTERNAL MAMMARY ARTERY (LIMA) AND RIGHT GREATER SAPHENOUS VEIN (SVG) HARVESTED ENDOSCOPICALLY;    LIMA-LAD, SVG-RAMUS, SVG-DIAG (N/A) TRANSESOPHAGEAL ECHOCARDIOGRAM (TEE) (N/A) Mobilize Diuresis cont milrinone and follow coox, cvp   LOS: 12 days    Jonathan King 12/23/2018

## 2018-12-23 NOTE — Progress Notes (Signed)
Peripherally Inserted Central Catheter/Midline Placement  The IV Nurse has discussed with the patient and/or persons authorized to consent for the patient, the purpose of this procedure and the potential benefits and risks involved with this procedure.  The benefits include less needle sticks, lab draws from the catheter, and the patient may be discharged home with the catheter. Risks include, but not limited to, infection, bleeding, blood clot (thrombus formation), and puncture of an artery; nerve damage and irregular heartbeat and possibility to perform a PICC exchange if needed/ordered by physician.  Alternatives to this procedure were also discussed.  Bard Power PICC patient education guide, fact sheet on infection prevention and patient information card has been provided to patient /or left at bedside.    PICC/Midline Placement Documentation  PICC Double Lumen 12/23/18 PICC Right Brachial 43 cm 2 cm (Active)  Indication for Insertion or Continuance of Line Vasoactive infusions;Prolonged intravenous therapies 12/22/18 0800  Exposed Catheter (cm) 2 cm 12/22/18 0800  Site Assessment Clean;Dry;Intact 12/22/18 0800  Lumen #1 Status Flushed;Saline locked;Blood return noted 12/22/18 0800  Lumen #2 Status Flushed;Saline locked;Blood return noted 12/22/18 0800  Dressing Type Transparent;Securing device 12/22/18 0800  Dressing Status Clean;Dry;Intact;Antimicrobial disc in place 12/22/18 0800  Line Care Connections checked and tightened 12/22/18 0800  Dressing Intervention New dressing;Other (Comment) 12/22/18 0800  Dressing Change Due 12/30/18 12/22/18 0800       Virgilio Belling 12/23/2018, 10:14 AM

## 2018-12-23 NOTE — Progress Notes (Signed)
Paged and spoke with MD Aundra Dubin to clarify if introducer could be removed as PICC line has been placed.  MD Aundra Dubin advised okay to discontinue introducer.  RN reviewed orders and MD Prescott Gum also wrote order to discontinue swan but to leave sheath in place.  Spoke with MD Prescott Gum, MD advised if Milrinone and Amiodarone are compatible it is okay to remove introducer.  RN confirmed via [employer] that medication is compatible.

## 2018-12-23 NOTE — Progress Notes (Signed)
Patient ID: Jonathan King, male   DOB: [birth year], 53 y.o.   MRN: [id number] TCTS Evening Rounds:  Hemodynamically stable in sinus rhythm on IV amio Milrinone 0.375 CVP 6  sats 97% on 4L [place]  Good urine output CT output low  BMET    Component Value Date/Time   NA 128 (L) 12/23/2018 1526   K 4.2 12/23/2018 1526   CL 92 (L) 12/23/2018 1526   CO2 25 12/23/2018 1526   GLUCOSE 197 (H) 12/23/2018 1526   BUN 10 12/23/2018 1526   CREATININE 0.81 12/23/2018 1526   CALCIUM 8.1 (L) 12/23/2018 1526   GFRNONAA >60 12/23/2018 1526   GFRAA >60 12/23/2018 1526    Continue milrinone.

## 2018-12-23 NOTE — Progress Notes (Addendum)
Pt went into afib RVR with rate up to 160's. On amio 30mg  infusion and given 5mg  IVP metoprolol with no affect on HR. Paged Dr. Roxy Manns, orders received for 150mg  amio bolus.   Of note- coox was drawn while patient in afib RVR. Will make day shift RN aware. Will wait to co CO/CI until pt converts out of afib.

## 2018-12-23 NOTE — Progress Notes (Addendum)
Progress Note  Patient Name: Jonathan King Date of Encounter: 12/23/2018  Primary Cardiologist: Dr. Debara Pickett Heart Failure: Dr. Haroldine Laws   Subjective   Only complaint currently is low back pain. Chest wall still sore but no angina or dyspnea.    Inpatient Medications    Scheduled Meds: . acetaminophen  1,000 mg Oral Q6H   Or  . acetaminophen (TYLENOL) oral liquid 160 mg/5 mL  1,000 mg Per Tube Q6H  . aspirin EC  325 mg Oral Daily   Or  . aspirin  324 mg Per Tube Daily  . atorvastatin  80 mg Oral q1800  . bisacodyl  10 mg Oral Daily   Or  . bisacodyl  10 mg Rectal Daily  . Chlorhexidine Gluconate Cloth  6 each Topical Daily  . digoxin  0.125 mg Oral Daily  . docusate sodium  200 mg Oral Daily  . furosemide  40 mg Intravenous BID  . insulin aspart  0-24 Units Subcutaneous Q4H  . metoCLOPramide (REGLAN) injection  10 mg Intravenous Q6H  . pantoprazole  40 mg Oral Daily  . sodium chloride flush  10-40 mL Intracatheter Q12H  . sodium chloride flush  3 mL Intravenous Q12H   Continuous Infusions: . sodium chloride Stopped (12/22/18 1743)  . sodium chloride    . sodium chloride 20 mL/hr at 12/21/18 1445  . amiodarone 30 mg/hr (12/23/18 0500)  . lactated ringers    . lactated ringers 20 mL/hr at 12/23/18 0500  . milrinone 0.375 mcg/kg/min (12/23/18 7517)  . norepinephrine (LEVOPHED) Adult infusion Stopped (12/22/18 1729)   PRN Meds: sodium chloride, metoprolol tartrate, midazolam, morphine injection, ondansetron (ZOFRAN) IV, oxyCODONE, sodium chloride flush, sodium chloride flush, traMADol   Vital Signs    Vitals:   12/23/18 0415 12/23/18 0430 12/23/18 0445 12/23/18 0500  BP: (!) 127/98 105/86 109/83   Pulse: (!) 46 (!) 34 (!) 44 (!) 33  Resp: (!) 23 19 19  (!) 22  Temp: 98.1 F (36.7 C) 98.2 F (36.8 C) 98.2 F (36.8 C) 98.1 F (36.7 C)  TempSrc:      SpO2: 95% 95% 91% 92%  Weight:      Height:        Intake/Output Summary (Last 24 hours) at 12/23/2018 0641  Last data filed at 12/23/2018 0500 Gross per 24 hour  Intake 2406.69 ml  Output 3306 ml  Net -899.31 ml   Last 3 Weights 12/22/2018 12/21/2018 12/20/2018  Weight (lbs) 208 lb 5.4 oz 188 lb 6.4 oz 191 lb 4.8 oz  Weight (kg) 94.5 kg 85.458 kg 86.773 kg      Telemetry    Currently NSR, HR mid 80s, PAF and PVCs on tele - Personally Reviewed  ECG    No new EKGs to review - Personally Reviewed  Physical Exam   GEN: middle aged WM in No acute distress.   Neck: elevated JVD to the level of the jaw, swan ganz catheter in place Cardiac: RRR, no murmurs, rubs, or gallops.  Respiratory: inspiratory crackles heard anteriorly, L>R GI: Soft, nontender, non-distended  MS: No edema; No deformity. Neuro:  Nonfocal  Psych: Normal affect   Labs    High Sensitivity Troponin:   Recent Labs  Lab 12/10/18 0248 12/10/18 0419 12/10/18 1154 12/10/18 1302  TROPONINIHS 42* 46* 41* 41*      Chemistry Recent Labs  Lab 12/16/18 2129 12/17/18 0426  12/22/18 0358 12/22/18 0408 12/22/18 1814 12/23/18 0413  NA 132* 134*   < > 134*  138 130* 130*  K 4.2 4.2   < > 3.9 4.0 3.6 4.0  CL 101 105   < > 105  --  98 98  CO2 22 21*   < > 22  --  23 24  GLUCOSE 108* 121*   < > 102*  --  119* 128*  BUN 22* 21*   < > 11  --  9 9  CREATININE 1.09 1.12   < > 0.90  --  0.96 0.86  CALCIUM 8.7* 8.3*   < > 7.7*  --  8.0* 8.1*  PROT  --  6.7  --   --   --   --   --   ALBUMIN 3.4* 3.2*  --   --   --   --   --   AST  --  26  --   --   --   --   --   ALT  --  29  --   --   --   --   --   ALKPHOS  --  65  --   --   --   --   --   BILITOT  --  0.8  --   --   --   --   --   GFRNONAA >60 >60   < > >60  --  >60 >60  GFRAA >60 >60   < > >60  --  >60 >60  ANIONGAP 9 8   < > 7  --  9 8   < > = values in this interval not displayed.     Hematology Recent Labs  Lab 12/22/18 0358 12/22/18 0408 12/22/18 1814 12/23/18 0413  WBC 12.0*  --  14.2* 13.6*  RBC 3.84*  --  3.94* 4.00*  HGB 11.3* 11.6* 11.9* 11.8*  HCT  35.1* 34.0* 35.9* 36.9*  MCV 91.4  --  91.1 92.3  MCH 29.4  --  30.2 29.5  MCHC 32.2  --  33.1 32.0  RDW 12.6  --  12.7 12.8  PLT 192  --  145* 144*    BNPNo results for input(s): BNP, PROBNP in the last 168 hours.   DDimer No results for input(s): DDIMER in the last 168 hours.   Radiology    Dg Chest Port 1 View  Result Date: 12/22/2018 CLINICAL DATA:  Postoperative CABG, CHF EXAM: PORTABLE CHEST 1 VIEW COMPARISON:  12/21/2018 FINDINGS: Interval removal of endotracheal and enteric tubes. A right IJ Swan-Ganz catheter terminates at the expected main pulmonary trunk. Left-sided chest tube and mediastinal drain remain in place. Median sternotomy wires. Heart size remains mildly enlarged. Improving aeration of the lung bases. Mild streaky right basilar opacity, likely atelectasis. No pleural effusion. No discernible pneumothorax. IMPRESSION: 1. Interval removal of endotracheal and enteric tubes. 2. Remaining support apparatus in stable positioning. No discernible pneumothorax. 3. Improving aeration of the lung bases with persistent right basilar atelectasis. Electronically Signed   By: Davina Poke M.D.   On: 12/22/2018 08:22   Dg Chest Port 1 View  Result Date: 12/21/2018 CLINICAL DATA:  Status post coronary bypass graft. EXAM: PORTABLE CHEST 1 VIEW COMPARISON:  Radiographs of same day. FINDINGS: Stable cardiomediastinal silhouette. Endotracheal tube is in good position. Interval placement of nasogastric tube, distal tip of which is not visualized. Left-sided chest tube is noted without pneumothorax. Right internal jugular Swan-Ganz catheter is noted with tip directed toward right pulmonary artery. Stable bibasilar atelectasis is noted with probable small  pleural effusions. Bony thorax is unremarkable. IMPRESSION: Endotracheal tube in grossly good position. Left-sided chest tube is noted without pneumothorax. Stable bibasilar atelectasis is noted with associated pleural effusions. Interval  placement of nasogastric tube, although the distal tip is not visualized due to radiographic technique. Electronically Signed   By: Marijo Conception M.D.   On: 12/21/2018 15:31   Dg Chest Port 1 View  Result Date: 12/21/2018 CLINICAL DATA:  Pneumothorax. EXAM: PORTABLE CHEST 1 VIEW COMPARISON:  CT 12/17/2018.  Chest x-ray 05/17/2018. FINDINGS: Endotracheal tube, mediastinal drainage catheter, Swan-Ganz catheter, left chest tube in good anatomic position. Mild bibasilar atelectasis/infiltrates. Prior CABG. No pulmonary venous congestion. IMPRESSION: 1. Lines and tubes including left chest tube in good anatomic position. No pneumothorax. 2.  Prior CABG.  Cardiomegaly. 3.  Bibasilar atelectasis/infiltrates. Electronically Signed   By: Marcello Moores  Register   On: 12/21/2018 14:54   [place] Ekg Site Rite  Result Date: 12/22/2018 If Site Rite image not attached, placement could not be confirmed due to current cardiac rhythm.   Cardiac Studies   [facility] 12/16/18 RIGHT/LEFT HEART CATH AND CORONARY ANGIOGRAPHY  Conclusion    RPDA lesion is 70% stenosed.  Ost LAD to Prox LAD lesion is 99% stenosed.  Prox LAD to Mid LAD lesion is 100% stenosed.  1st Diag lesion is 99% stenosed.  Ramus lesion is 90% stenosed.  Findings:  Ao = 88/51 (68) LV = 86/17 RA = 3 RV = 46/5 PA = 45/10 (23) PCW = 17 Fick cardiac output/index = 4.2/2.0 PVR = 1.5 WU Ao sat = 98% PA sat = 67%, 68%  Assessment: 1. 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 Well-compensated filling pressures with moderately reduced CO  Plan/Discussion:  He has severe CAD with ischemic CM but I suspect EF also made worse by frequent PVCs. Will d/w TCTC regarding possible CABG. Will also need repeat imaging +/- biopsy or LLL pulmonary nodule.    2D Echo 12/11/18 1. The left ventricle has a visually estimated ejection fraction of 15-20%. The cavity size was moderately dilated.  Findings are consistent with dilated cardiomyopathy. Left ventricular diastolic Doppler parameters are consistent with restrictive filling. Elevated left atrial and left ventricular end-diastolic pressures Left ventricular diffuse hypokinesis. 2. The right ventricle has moderately reduced systolic function. The cavity was normal. There is no increase in right ventricular wall thickness. 3. Left atrial size was mildly dilated. 4. The mitral valve is abnormal. Mild thickening of the mitral valve leaflet. Mitral valve regurgitation is moderate by color flow Doppler. 5. The tricuspid valve is grossly normal. 6. The aortic valve is abnormal. Mild thickening of the aortic valve. Aortic valve regurgitation is trivial by color flow Doppler. No stenosis of the aortic valve. 7. The aorta is normal unless otherwise noted. 8. The inferior vena cava was dilated in size with <50% respiratory variability.   Patient Profile     53 y.o. male w/ no PMH other than cholecystectomy in 2013, who presented w/ DOE, orthopnea, LEE and weight gain. Found to be in acute CHF. Echo showed severely reduced LVEF, 15-20%. Cath showed severe 3V CAD s/p CABG 12/21/18.  Additional hospital medical problems include high burden PVCs and new diagnosis of a LLL nodule  Assessment & Plan    1. CAD s/p CABG:  severe native vessel CAD noted on cath this admit, s/p CABG x 3 w/ left internal mammary artery to left anterior descending, saphenous vein graft  to diagonal, saphenous vein graft to ramus intermedius. POD#2. Stable w/o CP. Off of norepinephrine and epi.  BP stable. MAPs in the 80s-90s. Continue ASA and statin for secondary prevention. No  blocker due to low output heart failure.   2. Post Operative Paroxsymal Atrial Fibrillation: s/p CABG. Went into afib w/ RVR ~0400 this am. Bolus of IV amiodarone, 150 mg given. Drip continued. Converted back to NSR. HR currently in the 80s. Continue to monitor on tele. Keep electrolytes  stable.   3. Acute Systolic CHF: presented w/ SOB, orthopnea and LEE. Initial BNP on admit was 1,200. Treated w/ IV diuretics. Good UOP yesterday with 3.4L out. Net I/Os -8.6 L total. Swan Ganz catheter in place. Hemodynamics improving with milrinone. CO 4.52. CI 2.18. CVP improved, down from 12 yesterday to 10 today. Co-ox however 39.5% but ? If accurate. Will redraw. We will continue IV Lasix for volume overload. SCr and K WNL. Continue strict I/Os and close monitoring of renal function and electrolytes.    4. Cardiomyopathy: EF 15-20%. Combination of ischemic (occlusive left system disease) + tachy mediated (high burden PVCs). S/p CABG. AAD therapy initiated for PVC suppression (amiodarone). Hopefully EF will improve post revascularization and suppression of ventricular ectopy. He is now off of pressors and MAPs stable in the 80s-90s. Consider addition of spironolactone today. If BP remains stable, will later consider ACE/ARB. Will need repeat echo in several weeks. If no improvement in LVEF to > 35%, he will ultimately need referral to EP for ICD consideration for primary prevention.   5. High PVC Burden: ~ 20% burden initially. Suspect likely contributing to cardiomyopathy. Amiodarone initiated for suppression. Keep electrolytes stable. K > 4.0 and Mg >2.0.  If unable to suppress PVCs with AAD therapy, consider outpatient referral to EP for PVC ablation.   6. LLL Nodule: new finding noted on chest CT 8/17. Concern for lung CA vs infection. Will need re-imaging in 3 weeks.  7. Hyponatremia: Na 130 today. Will continue to monitor.   8. Lipids: FLP this morning shows well controlled LDL and TG. LDL 12 mg/dL and TG 53. However given CAD, will continue on statin therapy. His HLD is low at 25. Once recovered and discharge, recommend outpatient cardiac rehab and regular physical activity.   9. NSVT: brief runs on tele. In the setting of severe cardiomyopathy w/ EF at 15-20%. Continue amiodarone. Keep  electrolytes stable. Will continue to monitor.    For questions or updates, please contact [place] Please consult [email or web address] for contact info under      Signed, Lyda Jester, PA-C  12/23/2018, 6:41 AM    Patient seen with PA, agree with the above note.   Swan numbers: RA 14 PA 44/21 CI 2.2  Co-ox 50%  He remains on milrinone 0.375 and amiodarone gtt.  Off NE and epinephrine.   He had a run of atrial fibrillation this morning, now back in NSR.    No complaints.  Good diuresis with IV Lasix yesterday.   General: NAD Neck: JVP 12 cm, no thyromegaly or thyroid nodule.  Lungs: Decreased BS at bases.  CV: Nondisplaced PMI.  Heart regular S1/S2, no S3/S4, no murmur.  1+ ankle edema. Abdomen: Soft, nontender, no hepatosplenomegaly, no distention.  Skin: Intact without lesions or rashes.  Neurologic: Alert and oriented x 3.  Psych: Normal affect. Extremities: No clubbing or cyanosis.  HEENT: Normal.   Today, will remove Swan and place PICC.  Adequate cardiac output from [place] though co-ox is  a bit low. Still with volume overload.  - Continue milrinone 0.375 for now.  - Continue digoxin.  - Can add spironolactone 12.5 daily.  - Increase Lasix to 60 mg IV bid. Creatinine stable.   Run atrial fibrillation earlier, now in NSR.  - Continue amiodarone.  - Would hold off on anticoagulation unless AF returns.   CRITICAL CARE Performed by: Loralie Champagne  Total critical care time: 35 minutes  Critical care time was exclusive of separately billable procedures and treating other patients.  Critical care was necessary to treat or prevent imminent or life-threatening deterioration.  Critical care was time spent personally by me on the following activities: development of treatment plan with patient and/or surrogate as well as nursing, discussions with consultants, evaluation of patient's response to treatment, examination of patient, obtaining history from patient or  surrogate, ordering and performing treatments and interventions, ordering and review of laboratory studies, ordering and review of radiographic studies, pulse oximetry and re-evaluation of patient's condition.   Loralie Champagne 12/23/2018  12:39 PM

## 2018-12-24 ENCOUNTER — Inpatient Hospital Stay (HOSPITAL_COMMUNITY): Payer: Self-pay

## 2018-12-24 LAB — CBC
HCT: 34.8 % — ABNORMAL LOW (ref 39.0–52.0)
Hemoglobin: 11.3 g/dL — ABNORMAL LOW (ref 13.0–17.0)
MCH: 29.5 pg (ref 26.0–34.0)
MCHC: 32.5 g/dL (ref 30.0–36.0)
MCV: 90.9 fL (ref 80.0–100.0)
Platelets: 144 10*3/uL — ABNORMAL LOW (ref 150–400)
RBC: 3.83 MIL/uL — ABNORMAL LOW (ref 4.22–5.81)
RDW: 12.7 % (ref 11.5–15.5)
WBC: 10.7 10*3/uL — ABNORMAL HIGH (ref 4.0–10.5)
nRBC: 0 % (ref 0.0–0.2)

## 2018-12-24 LAB — COOXEMETRY PANEL
Carboxyhemoglobin: 0.9 % (ref 0.5–1.5)
Methemoglobin: 0.8 % (ref 0.0–1.5)
O2 Saturation: 66.4 %
Total hemoglobin: 11.6 g/dL — ABNORMAL LOW (ref 12.0–16.0)

## 2018-12-24 LAB — TYPE AND SCREEN
ABO/RH(D): O POS
Antibody Screen: NEGATIVE
Unit division: 0
Unit division: 0
Unit division: 0
Unit division: 0

## 2018-12-24 LAB — BASIC METABOLIC PANEL
Anion gap: 8 (ref 5–15)
BUN: 11 mg/dL (ref 6–20)
CO2: 30 mmol/L (ref 22–32)
Calcium: 8.2 mg/dL — ABNORMAL LOW (ref 8.9–10.3)
Chloride: 94 mmol/L — ABNORMAL LOW (ref 98–111)
Creatinine, Ser: 0.79 mg/dL (ref 0.61–1.24)
GFR calc Af Amer: >60 mL/min (ref >60)
GFR calc non Af Amer: >60 mL/min (ref >60)
Glucose, Bld: 141 mg/dL — ABNORMAL HIGH (ref 70–99)
Potassium: 3.6 mmol/L (ref 3.5–5.1)
Sodium: 132 mmol/L — ABNORMAL LOW (ref 135–145)

## 2018-12-24 LAB — COMPREHENSIVE METABOLIC PANEL WITH GFR
ALT: 21 U/L (ref 0–44)
AST: 24 U/L (ref 15–41)
Albumin: 2.5 g/dL — ABNORMAL LOW (ref 3.5–5.0)
Alkaline Phosphatase: 48 U/L (ref 38–126)
Anion gap: 7 (ref 5–15)
BUN: 10 mg/dL (ref 6–20)
CO2: 28 mmol/L (ref 22–32)
Calcium: 7.7 mg/dL — ABNORMAL LOW (ref 8.9–10.3)
Chloride: 97 mmol/L — ABNORMAL LOW (ref 98–111)
Creatinine, Ser: 0.83 mg/dL (ref 0.61–1.24)
GFR calc Af Amer: >60 mL/min
GFR calc non Af Amer: >60 mL/min
Glucose, Bld: 111 mg/dL — ABNORMAL HIGH (ref 70–99)
Potassium: 3 mmol/L — ABNORMAL LOW (ref 3.5–5.1)
Sodium: 132 mmol/L — ABNORMAL LOW (ref 135–145)
Total Bilirubin: 0.6 mg/dL (ref 0.3–1.2)
Total Protein: 4.8 g/dL — ABNORMAL LOW (ref 6.5–8.1)

## 2018-12-24 LAB — BPAM RBC
Blood Product Expiration Date: 202009182359
Blood Product Expiration Date: 202009182359
Blood Product Expiration Date: 202009182359
Blood Product Expiration Date: 202009182359
ISSUE DATE / TIME: 202008201451
ISSUE DATE / TIME: 202008251004
ISSUE DATE / TIME: 202008251004
Unit Type and Rh: 5100
Unit Type and Rh: 5100
Unit Type and Rh: 5100
Unit Type and Rh: 5100

## 2018-12-24 LAB — GLUCOSE, CAPILLARY
Glucose-Capillary: 107 mg/dL — ABNORMAL HIGH (ref 70–99)
Glucose-Capillary: 107 mg/dL — ABNORMAL HIGH (ref 70–99)
Glucose-Capillary: 132 mg/dL — ABNORMAL HIGH (ref 70–99)
Glucose-Capillary: 118 mg/dL — ABNORMAL HIGH (ref 70–99)

## 2018-12-24 LAB — MAGNESIUM: Magnesium: 1.9 mg/dL (ref 1.7–2.4)

## 2018-12-24 MED ORDER — AMIODARONE HCL 200 MG PO TABS
400.0000 mg | ORAL_TABLET | Freq: Two times a day (BID) | ORAL | Status: DC
  Administered 2018-12-24: 10:00:00 400 mg via ORAL
  Filled 2018-12-24: qty 2

## 2018-12-24 MED ORDER — POTASSIUM CHLORIDE 10 MEQ/50ML IV SOLN: 10.0000 meq | INTRAVENOUS | Status: DC

## 2018-12-24 MED ORDER — AMIODARONE IV BOLUS ONLY 150 MG/100ML
150.0000 mg | Freq: Once | INTRAVENOUS | Status: AC
  Administered 2018-12-24: 150 mg via INTRAVENOUS

## 2018-12-24 MED ORDER — POTASSIUM CHLORIDE 10 MEQ/50ML IV SOLN
10.0000 meq | Freq: Once | INTRAVENOUS | Status: AC
  Administered 2018-12-24: 10 meq via INTRAVENOUS

## 2018-12-24 MED ORDER — FUROSEMIDE 10 MG/ML IJ SOLN
60.0000 mg | Freq: Once | INTRAMUSCULAR | Status: AC
  Administered 2018-12-24: 60 mg via INTRAVENOUS
  Filled 2018-12-24: qty 6

## 2018-12-24 MED ORDER — POTASSIUM CHLORIDE 10 MEQ/50ML IV SOLN
10.0000 meq | INTRAVENOUS | Status: AC
  Administered 2018-12-24 (×3): 10 meq via INTRAVENOUS
  Filled 2018-12-24 (×3): qty 50

## 2018-12-24 MED ORDER — AMIODARONE HCL IN DEXTROSE 360-4.14 MG/200ML-% IV SOLN
INTRAVENOUS | Status: AC
  Filled 2018-12-24: qty 200

## 2018-12-24 MED ORDER — AMIODARONE HCL IN DEXTROSE 360-4.14 MG/200ML-% IV SOLN
30.0000 mg/h | INTRAVENOUS | Status: DC
  Administered 2018-12-24: 30 mg/h via INTRAVENOUS

## 2018-12-24 MED ORDER — DEXTROSE 5 % IV SOLN
300.0000 mg | Freq: Once | INTRAVENOUS | Status: DC
  Filled 2018-12-24: qty 6

## 2018-12-24 MED ORDER — AMIODARONE HCL IN DEXTROSE 360-4.14 MG/200ML-% IV SOLN
60.0000 mg/h | INTRAVENOUS | Status: AC
  Administered 2018-12-24: 60 mg/h via INTRAVENOUS
  Filled 2018-12-24: qty 200

## 2018-12-24 MED ORDER — AMIODARONE IV BOLUS ONLY 150 MG/100ML
150.0000 mg | Freq: Once | INTRAVENOUS | Status: AC
  Administered 2018-12-24: 14:00:00 150 mg via INTRAVENOUS
  Filled 2018-12-24: qty 100

## 2018-12-24 MED ORDER — AMIODARONE HCL IN DEXTROSE 360-4.14 MG/200ML-% IV SOLN
INTRAVENOUS | Status: AC
  Filled 2018-12-24: qty 400

## 2018-12-24 MED ORDER — MAGNESIUM SULFATE IN D5W 1-5 GM/100ML-% IV SOLN
1.0000 g | Freq: Once | INTRAVENOUS | Status: AC
  Administered 2018-12-24: 1 g via INTRAVENOUS
  Filled 2018-12-24: qty 100

## 2018-12-24 MED ORDER — AMIODARONE HCL IN DEXTROSE 360-4.14 MG/200ML-% IV SOLN: 30.0000 mg/h | INTRAVENOUS | Status: DC

## 2018-12-24 MED ORDER — AMIODARONE HCL IN DEXTROSE 360-4.14 MG/200ML-% IV SOLN
30.0000 mg/h | INTRAVENOUS | Status: DC
  Administered 2018-12-24 – 2018-12-29 (×10): 30 mg/h via INTRAVENOUS
  Filled 2018-12-24 (×10): qty 200

## 2018-12-24 MED ORDER — POTASSIUM CHLORIDE 10 MEQ/50ML IV SOLN
10.0000 meq | INTRAVENOUS | Status: AC
  Administered 2018-12-24 (×2): 10 meq via INTRAVENOUS
  Filled 2018-12-24 (×2): qty 50

## 2018-12-24 MED ORDER — AMIODARONE LOAD VIA INFUSION
150.0000 mg | Freq: Once | INTRAVENOUS | Status: AC
  Administered 2018-12-24: 150 mg via INTRAVENOUS
  Filled 2018-12-24: qty 83.34

## 2018-12-24 MED ORDER — POTASSIUM CHLORIDE 10 MEQ/50ML IV SOLN
10.0000 meq | INTRAVENOUS | Status: AC
  Administered 2018-12-24 (×3): 10 meq via INTRAVENOUS
  Filled 2018-12-24: qty 50

## 2018-12-24 MED ORDER — AMIODARONE IV BOLUS ONLY 150 MG/100ML
150.0000 mg | Freq: Once | INTRAVENOUS | Status: AC
  Administered 2018-12-24: 150 mg via INTRAVENOUS
  Filled 2018-12-24: qty 100

## 2018-12-24 MED ORDER — POTASSIUM CHLORIDE CRYS ER 20 MEQ PO TBCR
40.0000 meq | EXTENDED_RELEASE_TABLET | Freq: Once | ORAL | Status: AC
  Administered 2018-12-24: 12:00:00 40 meq via ORAL
  Filled 2018-12-24: qty 2

## 2018-12-24 MED FILL — Sodium Bicarbonate IV Soln 8.4%: INTRAVENOUS | Qty: 50 | Status: AC

## 2018-12-24 MED FILL — Albumin, Human Inj 5%: INTRAVENOUS | Qty: 250 | Status: AC

## 2018-12-24 MED FILL — Sodium Chloride IV Soln 0.9%: INTRAVENOUS | Qty: 2000 | Status: AC

## 2018-12-24 MED FILL — Magnesium Sulfate Inj 50%: INTRAMUSCULAR | Qty: 10 | Status: AC

## 2018-12-24 MED FILL — Heparin Sodium (Porcine) Inj 1000 Unit/ML: INTRAMUSCULAR | Qty: 10 | Status: AC

## 2018-12-24 MED FILL — Potassium Chloride Inj 2 mEq/ML: INTRAVENOUS | Qty: 40 | Status: AC

## 2018-12-24 MED FILL — Lidocaine HCl Local Soln Prefilled Syringe 100 MG/5ML (2%): INTRAMUSCULAR | Qty: 5 | Status: AC

## 2018-12-24 MED FILL — Calcium Chloride Inj 10%: INTRAVENOUS | Qty: 10 | Status: AC

## 2018-12-24 MED FILL — Heparin Sodium (Porcine) Inj 1000 Unit/ML: INTRAMUSCULAR | Qty: 30 | Status: AC

## 2018-12-24 MED FILL — Mannitol IV Soln 20%: INTRAVENOUS | Qty: 500 | Status: AC

## 2018-12-24 MED FILL — Electrolyte-R (PH 7.4) Solution: INTRAVENOUS | Qty: 6000 | Status: AC

## 2018-12-24 NOTE — Discharge Summary (Addendum)
Physician Discharge Summary  Patient ID: Jonathan King MRN: [id number] DOB/AGE: [birth year] 53 y.o.  Admit date: 12/09/2018 Discharge date: 12/30/2018  Admission Diagnoses: Combined systolic and diastolic congestive heart failure  Discharge Diagnoses:  Principal Problem:   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) Active Problems:   Acute systolic (congestive) heart failure (HCC)   NSVT (nonsustained ventricular tachycardia) (HCC)   Pre-diabetes   GERD (gastroesophageal reflux disease)   Cough   Hypokalemia   Hyponatremia   Hypochloremia   Elevated ALT measurement   Leukocytosis   Elevated TSH   SOB (shortness of breath)   Aspiration pneumonia of right lower lobe (HCC)   Lung mass   S/P CABG x 3   Solitary pulmonary nodule  Patient Active Problem List   Diagnosis Date Noted  . Solitary pulmonary nodule 12/27/2018  . 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 SOB (shortness of breath)   . Aspiration pneumonia of right lower lobe (HCC)   . Lung mass   . Hypokalemia 12/12/2018  . Hyponatremia 12/12/2018  . Hypochloremia 12/12/2018  . Elevated ALT measurement 12/12/2018  . Leukocytosis 12/12/2018  . Elevated TSH 12/12/2018  . NSVT (nonsustained ventricular tachycardia) (HCC) 12/11/2018  . 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) 12/11/2018  . Pre-diabetes 12/11/2018  . GERD (gastroesophageal reflux disease) 12/11/2018  . Cough 12/11/2018  . Acute systolic (congestive) heart failure (HCC) 12/10/2018  . MRSA infection 05/23/2013  . Acute cholecystitis 01/30/2012   History of the present illness: The patient is a 53 year old male who presented to the emergency department and then we sent that to complaining of orthopnea, shortness of breath, swelling and abdominal discomfort.  Patient has no significant past medical history but has developed progressive dyspnea as well.  He also has a nonproductive cough.  Symptoms have been progressive over  time over approximately 1 month.  For the abdominal symptoms he was recently prescribed Protonix and Zofran with improvement.  Upon arrival to the emergency department he was found to be afebrile with oxygen saturations of 90% at room air while at rest with slight tachypnea and stable vital signs.  EKG featured sinus rhythm with PVCs and nonspecific ST abnormalities.  Troponin was mildly elevated and BNP was 1201.  The patient was felt to require admission for further evaluation and treatment and was admitted to the hospitalist service.  Discharged Condition: good  Hospital Course:    Cardiology consultation was obtained and he underwent a complete evaluation.  Echocardiogram showed significant decrease in ejection fraction in the range of 15 to 20%.  Other findings noted includes severe global hypokinesis, dilated LV, moderate MR, trivial AI, mild LAE, elevated LV filling pressures.  Patient also developed an episode of NSVT.  Due to these findings the patient was felt to need right and left heart catheterization to exclude ischemia.  He was set on a course of diuretics and started on low-dose beta-blocker as well.  The advanced heart failure team was also consulted.  He initially refused cardiac catheterization and underwent cardiac CT which showed transient ischemic dilatation and marked LV dysfunction with an LVEF of 16%.  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  He was started on Unasyn and PCCM less consulted to assist with management.  Ultimately he did agree to proceed with cardiac catheterization which revealed multivessel coronary artery disease.  Cardiothoracic surgical consultation was obtained with Jonathan King who evaluated the patient and studies and agreed with recommendations to proceed with CABG.  He was monitored closely with aggressive medical management and on 12/21/2018 he was felt stable to  proceed with surgery.  On this date he underwent the below described procedure.  He tolerated it well and was taken to the surgical ICU in stable condition.  Postoperative hospital course:  The patient has made good and steady progress.  He was weaned from the ventilator without significant difficulties using standard protocols.  He has remained hemodynamically stable and has had aggressive diuretics and heart failure management per the advanced heart failure team.  Milrinone was weaned over a several day.  All routine lines, monitors and drainage devices have been discontinued in the standard fashion.  He has a mild expected acute blood loss anemia which is stable.  He did have postoperative atrial fibrillation but was chemically cardioverted to sinus rhythm on amiodarone and has maintained for several days.  Renal function has remained within normal limits.  Incisions are noted to be healing well without evidence of infection.  He is tolerating gradually increasing activities using standard cardiac rehab protocols.  Oxygen was weaned and he maintains good saturations on room air.  Blood sugars have been well controlled and he is not a diabetic by history.  At the time of discharge the patient is felt to be quite stable.  He will require close reevaluation of his pulmonary nodule early in the postoperative period  Consults: cardiology and pulmonary/intensive care  Significant Diagnostic Studies: angiography: cardiac cath. Nuclear stress test, cardiac CT, echocardiogram  Treatments: surgery:  OPERATIVE REPORT  DATE OF PROCEDURE:  12/21/2018 The patient has been discharged on: OPERATION: 1.  Coronary artery bypass grafting x3 (left internal mammary artery to left anterior descending, saphenous vein graft to diagonal, saphenous vein graft to ramus intermedius). 2.  Endoscopic harvest of right leg greater saphenous vein.  SURGEON:  Kerin Perna, MD  ASSISTANT:  Gershon Crane,  PA-C  ANESTHESIA:  General by Karna Christmas, MD  PREOPERATIVE DIAGNOSES:  Ischemic cardiomyopathy, severe multivessel coronary artery disease, ejection fraction 15-20%.  POSTOPERATIVE DIAGNOSES:  Ischemic cardiomyopathy, severe multivessel coronary artery disease, ejection fraction 15-20%.    Discharge Exam: Blood pressure 102/74, pulse 77, temperature 98.1 F (36.7 C), temperature source Oral, resp. rate 18, height 6' (1.829 m), weight 86.3 kg, SpO2 92 %.   General appearance: alert, cooperative and no distress Heart: regular rate and rhythm Lungs: clear to auscultation bilaterally Abdomen: benign Extremities: no edema Wound: incis healing well Disposition: Discharge disposition: 01-Home or Self Care       Discharge Instructions    Discharge patient   Complete by: As directed    Discharge disposition: 01-Home or Self Care   Discharge patient date: 12/30/2018     Allergies as of 12/30/2018   No Known Allergies     Medication List    STOP taking these medications   ibuprofen 600 MG tablet Commonly known as: ADVIL   omeprazole 20 MG capsule Commonly known as: PRILOSEC   ondansetron 4 MG disintegrating tablet Commonly known as: Zofran ODT     TAKE these medications   amiodarone 200 MG tablet Commonly known as: PACERONE Take 1 tablet (200 mg total) by mouth 2 (two) times daily.   aspirin 325 MG EC tablet Take 1 tablet (325 mg total) by mouth daily. Start taking on: December 31, 2018   atorvastatin 80 MG tablet Commonly known as: LIPITOR Take 1 tablet (  80 mg total) by mouth daily at 6 PM.   digoxin 0.125 MG tablet Commonly known as: LANOXIN Take 1 tablet (0.125 mg total) by mouth daily. Start taking on: December 31, 2018   losartan 25 MG tablet Commonly known as: COZAAR Take 1 tablet (25 mg total) by mouth daily. Start taking on: December 31, 2018   potassium chloride 10 MEQ tablet Commonly known as: K-DUR Take 1 tablet (10 mEq total) by mouth  daily. Start taking on: December 31, 2018   spironolactone 25 MG tablet Commonly known as: ALDACTONE Take 1 tablet (25 mg total) by mouth daily. Start taking on: December 31, 2018   traMADol 50 MG tablet Commonly known as: ULTRAM Take 1-2 tablets (50-100 mg total) by mouth every 6 (six) hours as needed for up to 7 days for moderate pain.      Follow-up Information    [facility]. Go on 01/10/2019.   Why: Appointment time at 2:30 wtih Dr Alvis Lemmings (spoke with Albin Felling) if unable to keep this appointment please call to reschedule Contact information: [address] [place] [address] [phone]       Kerin Perna, MD Follow up.   Specialty: Cardiothoracic Surgery Why: Please see discharge paperwork for follow-up appointment with surgeon.  Obtain a chest x-ray at [facility] 1/2-hour prior to this appointment.  It is located in the same office complex on the first floor. Contact information: [address] [place] [address] [phone]        [facility]. Go on 01/05/2019.   Specialty: Cardiology Why: 10:00 AM, [facility], Entrance C parking code [phone] Contact information: [address] [id number] Wilhe[place] Bonito [place] [address] [phone]        The patient has been discharged on:   1.Beta Blocker:  Yes [   ]                              No   [ n  ]                              If No, reason:low BP  2.Ace Inhibitor/ARB: Yes [ y  ]                                     No  [    ]                                     If No, reason:  3.Statin:   Yes [  y ]                  No  [   ]                  If No, reason:  4.Ecasa:  Yes  [ y  ]                  No   [   ]                  If No, reason:  Signed: Rowe Clack 12/30/2018,  patient examined and medical record reviewed,agree with above note. Kathlee Nations Trigt  III 01/05/2019

## 2018-12-24 NOTE — Progress Notes (Addendum)
[place] [address]       [employer] [address]             [phone]      3 Days Post-Op Procedure(s) (LRB): CORONARY ARTERY BYPASS GRAFTING (CABG) x 3, USING LEFT INTERNAL MAMMARY ARTERY (LIMA) AND RIGHT GREATER SAPHENOUS VEIN (SVG) HARVESTED ENDOSCOPICALLY;    LIMA-LAD, SVG-RAMUS, SVG-DIAG (N/A) TRANSESOPHAGEAL ECHOCARDIOGRAM (TEE) (N/A) Subjective: Feels better  Objective: Vital signs in last 24 hours: Temp:  [97.7 F (36.5 C)-98.6 F (37 C)] 97.9 F (36.6 C) (08/28 0800) Pulse Rate:  [62-101] 95 (08/28 0600) Cardiac Rhythm: Normal sinus rhythm (08/27 2000) Resp:  [14-27] 27 (08/28 0800) BP: (93-132)/(61-109) 122/92 (08/28 0800) SpO2:  [89 %-100 %] 97 % (08/28 0800) Weight:  [92 kg] 92 kg (08/28 0500)  Hemodynamic parameters for last 24 hours: PAP: (46-50)/(22-26) 46/23 CVP:  [6 mmHg-9 mmHg] 9 mmHg  Intake/Output from previous day: 08/27 0701 - 08/28 0700 In: 2033.9 [P.O.:1200; I.V.:731.2; IV Piggyback:102.8] Out: 1950 [Urine:4380; Chest Tube:140] Intake/Output this shift: No intake/output data recorded.  General appearance: alert, cooperative and no distress Heart: regular rate and rhythm and frequent extrasystoles Lungs: broncho-vesicular BS on left Abdomen: benign Extremities: min edema Wound: dressings intact  Lab Results: Recent Labs    12/23/18 0413 12/24/18 0417  WBC 13.6* 10.7*  HGB 11.8* 11.3*  HCT 36.9* 34.8*  PLT 144* 144*   BMET:  Recent Labs    12/23/18 1526 12/24/18 0417  NA 128* 132*  K 4.2 3.0*  CL 92* 97*  CO2 25 28  GLUCOSE 197* 111*  BUN 10 10  CREATININE 0.81 0.83  CALCIUM 8.1* 7.7*    PT/INR:  Recent Labs    12/21/18 1455  LABPROT 17.0*  INR 1.4*   ABG    Component Value Date/Time   PHART 7.355 12/22/2018 0408   HCO3 22.2 12/22/2018 0408   TCO2 23 12/22/2018 0408   ACIDBASEDEF 3.0 (H) 12/22/2018 0408   O2SAT 66.4 12/24/2018 0435   CBG (last 3)  Recent Labs    12/23/18 1529 12/23/18 2118  12/24/18 0640  GLUCAP 188* 117* 107*    Meds Scheduled Meds: . acetaminophen  1,000 mg Oral Q6H   Or  . acetaminophen (TYLENOL) oral liquid 160 mg/5 mL  1,000 mg Per Tube Q6H  . aspirin EC  325 mg Oral Daily   Or  . aspirin  324 mg Per Tube Daily  . atorvastatin  80 mg Oral q1800  . bisacodyl  10 mg Oral Daily   Or  . bisacodyl  10 mg Rectal Daily  . Chlorhexidine Gluconate Cloth  6 each Topical Daily  . digoxin  0.125 mg Oral Daily  . docusate sodium  200 mg Oral Daily  . enoxaparin (LOVENOX) injection  40 mg Subcutaneous Q24H  . furosemide  60 mg Intravenous BID  . insulin aspart  0-15 Units Subcutaneous TID WC  . insulin aspart  0-5 Units Subcutaneous QHS  . metoCLOPramide (REGLAN) injection  10 mg Intravenous Q6H  . pantoprazole  40 mg Oral Daily  . simethicone  80 mg Oral QID  . sodium chloride flush  10-40 mL Intracatheter Q12H  . sodium chloride flush  3 mL Intravenous Q12H  . spironolactone  12.5 mg Oral Daily   Continuous Infusions: . sodium chloride Stopped (12/23/18 1215)  . amiodarone 30 mg/hr (12/23/18 2125)  . milrinone 0.375 mcg/kg/min (12/24/18 0640)  . potassium chloride 10 mEq (12/24/18 0827)  . potassium  chloride     PRN Meds:.sodium chloride, midazolam, morphine injection, ondansetron (ZOFRAN) IV, oxyCODONE, sodium chloride flush, sodium chloride flush, traMADol  Xrays Dg Chest Port 1 View  Result Date: 12/23/2018 CLINICAL DATA:  53 year old male with systolic and diastolic heart failure, prior CABG EXAM: PORTABLE CHEST 1 VIEW COMPARISON:  December 22, 2018, December 21, 2018 FINDINGS: Cardiomediastinal silhouette unchanged. Surgical changes of median sternotomy and CABG. Right IJ sheath transmits Swan-Ganz catheter, which is unchanged terminating in the proximal pulmonary artery towards the right. Mediastinal/pleural drains persist. Epicardial pacing leads partially visualized. Low lung volumes with linear opacities at the lung bases. Interval development  of small left pneumothorax No right pneumothorax visualized. IMPRESSION: Interval development of small left pneumothorax. Unchanged mediastinal/pleural drains, with partially visualized epicardial pacing leads. Unchanged right IJ sheath transmits a Swan-Ganz catheter which terminates in the proximal pulmonary artery towards the right. Post surgical changes of median sternotomy and CABG, with low lung volumes with likely basilar atelectasis. Electronically Signed   By: Corrie Mckusick D.O.   On: 12/23/2018 08:53   [place] Ekg Site Rite  Result Date: 12/23/2018 If Site Rite image not attached, placement could not be confirmed due to current cardiac rhythm.  [place] Ekg Site Rite  Result Date: 12/22/2018 If [facility] image not attached, placement could not be confirmed due to current cardiac rhythm.   Assessment/Plan: S/P Procedure(s) (LRB): CORONARY ARTERY BYPASS GRAFTING (CABG) x 3, USING LEFT INTERNAL MAMMARY ARTERY (LIMA) AND RIGHT GREATER SAPHENOUS VEIN (SVG) HARVESTED ENDOSCOPICALLY;    LIMA-LAD, SVG-RAMUS, SVG-DIAG (N/A) TRANSESOPHAGEAL ECHOCARDIOGRAM (TEE) (N/A)  1 conts to do well 2 hemodyn stable in sinus with PVC's, Co-Ox 66 on .375 milrinone. AHF team conts to assist with management. May be able to decrease diuretics,CVP 4- excellent UOP- normal renal fxn. Transition to po amio soon 3 CXR clear, small air leak in CT- cont for now 4 stable minor anemia 5 minor thrombocytopenia- stable at 144K 6 BS adeq control, HgA1C 6.1on no home meds 7 hyponatremia improved 8 push rehab and pulm toilet  LOS: 13 days    Jonathan King [facility] 12/24/2018 Pager [phone]  Progressing after CABGx3 for ischemic CM, CHF, Ef .15 Airleak from chest tubes improved- DC MT and leave pleural tube Walking in hall Co-ox better now in NSR Wean milrinone per HF  - prob move to 2 C when stable on lower dose of milrinone  patient examined and medical record reviewed,agree with above note. Tharon Aquas Trigt III 12/24/2018

## 2018-12-24 NOTE — Progress Notes (Addendum)
Progress Note  Patient Name: Jonathan King Date of Encounter: 12/24/2018  Primary Cardiologist: Dr. Debara Pickett Bensimhon  Subjective   No complaints currently. Sitting up in bed on his cell phone. Denies palpitations. Chest is still sore but no angina. Denies dyspnea.   Inpatient Medications    Scheduled Meds: . acetaminophen  1,000 mg Oral Q6H   Or  . acetaminophen (TYLENOL) oral liquid 160 mg/5 mL  1,000 mg Per Tube Q6H  . aspirin EC  325 mg Oral Daily   Or  . aspirin  324 mg Per Tube Daily  . atorvastatin  80 mg Oral q1800  . bisacodyl  10 mg Oral Daily   Or  . bisacodyl  10 mg Rectal Daily  . Chlorhexidine Gluconate Cloth  6 each Topical Daily  . digoxin  0.125 mg Oral Daily  . docusate sodium  200 mg Oral Daily  . enoxaparin (LOVENOX) injection  40 mg Subcutaneous Q24H  . insulin aspart  0-15 Units Subcutaneous TID WC  . insulin aspart  0-5 Units Subcutaneous QHS  . pantoprazole  40 mg Oral Daily  . simethicone  80 mg Oral QID  . sodium chloride flush  10-40 mL Intracatheter Q12H  . spironolactone  12.5 mg Oral Daily   Continuous Infusions: . sodium chloride Stopped (12/24/18 1357)  . amiodarone 30 mg/hr (12/24/18 1452)  . milrinone 0.375 mcg/kg/min (12/24/18 1400)   PRN Meds: sodium chloride, midazolam, morphine injection, ondansetron (ZOFRAN) IV, oxyCODONE, sodium chloride flush, traMADol   Vital Signs    Vitals:   12/24/18 1354 12/24/18 1357 12/24/18 1400 12/24/18 1439  BP: 103/78 100/89 104/82 107/79  Pulse: (!) 58 (!) 34 (!) 139 (!) 56  Resp: (!) 25 (!) 27 (!) 25 (!) 26  Temp:      TempSrc:      SpO2: 96% 96% 95% 98%  Weight:      Height:        Intake/Output Summary (Last 24 hours) at 12/24/2018 1510 Last data filed at 12/24/2018 1452 Gross per 24 hour  Intake 1499.48 ml  Output 5150 ml  Net -3650.52 ml   Last 3 Weights 12/24/2018 12/23/2018 12/22/2018  Weight (lbs) 202 lb 13.2 oz 208 lb 5.4 oz 208 lb 5.4 oz  Weight (kg) 92 kg 94.5 kg 94.5 kg      Telemetry    Atrial fibrillation w/ RVR and NSVT - Personally Reviewed  ECG    Atrial fibrillation w/ RVR 145 bpm, LAD- Personally Reviewed  Physical Exam   GEN: middle aged WM, in No acute distress.   Neck: elevated JVD just above the clavicle  Cardiac: irregularly irregular rhythm, tachy rate, pericardial friction rub present  Respiratory: inspiratory crackles anteriorly L>R GI: Soft, nontender, non-distended  MS: No edema; No deformity. Neuro:  Nonfocal  Psych: Normal affect   Labs    High Sensitivity Troponin:   Recent Labs  Lab 12/10/18 0248 12/10/18 0419 12/10/18 1154 12/10/18 1302  TROPONINIHS 42* 46* 41* 41*      Chemistry Recent Labs  Lab 12/23/18 0413 12/23/18 1526 12/24/18 0417  NA 130* 128* 132*  K 4.0 4.2 3.0*  CL 98 92* 97*  CO2 24 25 28   GLUCOSE 128* 197* 111*  BUN 9 10 10   CREATININE 0.86 0.81 0.83  CALCIUM 8.1* 8.1* 7.7*  PROT  --   --  4.8*  ALBUMIN  --   --  2.5*  AST  --   --  24  ALT  --   --  21  ALKPHOS  --   --  16  BILITOT  --   --  0.6  GFRNONAA >60 >60 >60  GFRAA >60 >60 >60  ANIONGAP 8 11 7      Hematology Recent Labs  Lab 12/22/18 1814 12/23/18 0413 12/24/18 0417  WBC 14.2* 13.6* 10.7*  RBC 3.94* 4.00* 3.83*  HGB 11.9* 11.8* 11.3*  HCT 35.9* 36.9* 34.8*  MCV 91.1 92.3 90.9  MCH 30.2 29.5 29.5  MCHC 33.1 32.0 32.5  RDW 12.7 12.8 12.7  PLT 145* 144* 144*    BNPNo results for input(s): BNP, PROBNP in the last 168 hours.   DDimer No results for input(s): DDIMER in the last 168 hours.   Radiology    Dg Chest Port 1 View  Result Date: 12/24/2018 CLINICAL DATA:  Coronary bypass EXAM: PORTABLE CHEST 1 VIEW COMPARISON:  12/23/2018 FINDINGS: Removal of the Swan-Ganz catheter and mediastinal drain. Right PICC line tip SVC RA junction. Left chest tube has been repositioned with the tip in the apex region. No significant pneumothorax on today's exam. Residual bibasilar atelectasis/mild patchy airspace disease. Trace  pleural effusions as before. Heart is enlarged. Coronary bypass changes noted. IMPRESSION: Stable postoperative findings. No current pneumothorax by plain radiography. Electronically Signed   By: Jerilynn Mages.  Shick M.D.   On: 12/24/2018 08:44   Dg Chest Port 1 View  Result Date: 12/23/2018 CLINICAL DATA:  53 year old male with systolic and diastolic heart failure, prior CABG EXAM: PORTABLE CHEST 1 VIEW COMPARISON:  December 22, 2018, December 21, 2018 FINDINGS: Cardiomediastinal silhouette unchanged. Surgical changes of median sternotomy and CABG. Right IJ sheath transmits Swan-Ganz catheter, which is unchanged terminating in the proximal pulmonary artery towards the right. Mediastinal/pleural drains persist. Epicardial pacing leads partially visualized. Low lung volumes with linear opacities at the lung bases. Interval development of small left pneumothorax No right pneumothorax visualized. IMPRESSION: Interval development of small left pneumothorax. Unchanged mediastinal/pleural drains, with partially visualized epicardial pacing leads. Unchanged right IJ sheath transmits a Swan-Ganz catheter which terminates in the proximal pulmonary artery towards the right. Post surgical changes of median sternotomy and CABG, with low lung volumes with likely basilar atelectasis. Electronically Signed   By: Corrie Mckusick D.O.   On: 12/23/2018 08:53   [place] Ekg Site Rite  Result Date: 12/23/2018 If Site Rite image not attached, placement could not be confirmed due to current cardiac rhythm.  [place] Ekg Site Rite  Result Date: 12/22/2018 If [facility] image not attached, placement could not be confirmed due to current cardiac rhythm.   Cardiac Studies   [facility] 12/16/18 RIGHT/LEFT HEART CATH AND CORONARY ANGIOGRAPHY  Conclusion    RPDA lesion is 70% stenosed.  Ost LAD to Prox LAD lesion is 99% stenosed.  Prox LAD to Mid LAD lesion is 100% stenosed.  1st Diag lesion is 99% stenosed.  Ramus lesion is 90% stenosed.   Findings:  Ao = 88/51 (68) LV = 86/17 RA = 3 RV = 46/5 PA = 45/10 (23) PCW = 17 Fick cardiac output/index = 4.2/2.0 PVR = 1.5 WU Ao sat = 98% PA sat = 67%, 68%  Assessment: 1. 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 Well-compensated filling pressures with moderately reduced CO  Plan/Discussion:  He has severe CAD with ischemic CM but I suspect EF also made worse by frequent PVCs. Will d/w TCTC regarding possible CABG. Will also need repeat imaging +/- biopsy or LLL pulmonary nodule.  2D Echo 12/11/18 1. The left ventricle has a visually estimated ejection fraction of 15-20%. The cavity size was moderately dilated. Findings are consistent with dilated cardiomyopathy. Left ventricular diastolic Doppler parameters are consistent with restrictive filling. Elevated left atrial and left ventricular end-diastolic pressures Left ventricular diffuse hypokinesis. 2. The right ventricle has moderately reduced systolic function. The cavity was normal. There is no increase in right ventricular wall thickness. 3. Left atrial size was mildly dilated. 4. The mitral valve is abnormal. Mild thickening of the mitral valve leaflet. Mitral valve regurgitation is moderate by color flow Doppler. 5. The tricuspid valve is grossly normal. 6. The aortic valve is abnormal. Mild thickening of the aortic valve. Aortic valve regurgitation is trivial by color flow Doppler. No stenosis of the aortic valve. 7. The aorta is normal unless otherwise noted. 8. The inferior vena cava was dilated in size with <50% respiratory variability.   Patient Profile     53 y.o.malew/ no PMH other thancholecystectomy in 2013, whopresented w/ DOE, orthopnea, LEE and weight gain. Found to be in acute CHF. Echo showed severely reduced LVEF, 15-20%. Cath showed severe 3V CAD s/p CABG 12/21/18.Additional hospital medical problems include high burden PVCs and  new diagnosis of a LLL nodule  Assessment & Plan    1. CAD s/p CABG: POD#3. Left internal mammary artery to left anterior descending, saphenous vein graft to diagonal, saphenous vein graft to ramus intermedius. Still in ICU. Denies CP. Chest tubes remain in place. Post op afib w/ RVR but otherwise stable. Pressors discontinued 8/27 but remains on milrinone. Continue ASA and statin for secondary prevention.   2. Post Operative Atrial Fibrillation: HR in the 140s. On IV amiodarone and just received a bolus x 2. Pressure is stable and he is asymptomatic. ? If milrinone also contributing. Will wean dose down to 0.25. Keep electrolytes stable. K 3.2 this am and Mg 1.9. IV K given this AM. Will give 1 gm IV Mg now. Continue IV amiodarone and digoxin. If Afib persists, will need to consider anticoagulation, once safe to do so from a surgical standpoint. Continue to monitor on tele.   3. Acute Systolic CHF: good diuresis w/ IV Lasix yesterday w/ 4.5L out in UOP. Overall I/Os net negative 12.3 L. Co-ox improved today at 66.4% (up from 50.1 yesterday). CVP  down to 5 mmHg today but his weight is up 12 lb compared to preop weight. ? Third spacing. Needs additional diuresis. Continue lasix. Will wean down milrinone to 0.25 given arrthymias. Continue spironolactone. Continue daily weights, strict I/Os and CVP monitoring.   4. Cardiomyopathy: EF 15-20%. Combination of ischemic (occlusive left system disease) + tachy mediated (high burden PVCs). S/p CABG. AAD therapy initiated for PVC suppression (amiodarone). Hopefully EF will improve post revascularization and suppression of ventricular ectopy. Will continue spironolactone and digoxin. BP is too soft for Entresto/ ACEi/ARB. Given improved Co-ox at 66%, will try to wean down milrinone. Will need repeat echo in several weeks. If no improvement in LVEF to >35%, he will ultimately need referral to EP for ICD consideration for primary prevention  5. High Burden PVCs: ~  20% burden initially. Suspect likely contributing to cardiomyopathy. On IV amiodarone but continues to have runs of NSVT. Milrinone may be contributing. Will reduce dose. Keep electrolytes stable. K > 4.0 and Mg >2.0.  If unable to suppress PVCs with AAD therapy, consider outpatient referral to EP for PVC ablation.   6. Hypokalemia: 3.2 today, in the setting of IV diuresis. -  4 L out in UOP yesterday. He received supplemental IV K this am. F/u BMP in the AM.   7. Hyponatremia: Na up from 128>>132. Continue to monitor.   8. LLL Nodule: new finding noted on chest CT 8/17. Concern for lung CA vs infection. Will need re-imaging in 3 weeks.  9. NSVT:  Multifactorial. In the setting of severely reduced LVEF, 15-20%, milrinone may be exacerbating, also hypokalemia. Reduce dose of milrinone. Keep electrolytes stable. K > 4.0 and Mg > 2.0. Supplemental IV K given earlier. Will give 1 gm IV Mg (1.9 this am).   10. Lipids: lipid panel this admit shows well controlled LDL and TG. LDL 12 mg/dL and TG 53. However given CAD, will continue on statin therapy. His HLD is low at 25. Once recovered and discharge, recommend outpatient cardiac rehab and regular physical activity.    For questions or updates, please contact [place] Please consult [email or web address] for contact info under        Signed, Lyda Jester, PA-C  12/24/2018, 3:10 PM    Patient seen and examined with the above-signed Advanced Practice Provider and/or Housestaff. I personally reviewed laboratory data, imaging studies and relevant notes. I independently examined the patient and formulated the important aspects of the plan. I have edited the note to reflect any of my changes or salient points. I have personally discussed the plan with the patient and/or family.  He is POD#3 CABG. Remains on milrinone 0.375 with co-ox 66%. Has developed post-of AF with RVR. Rates quite fast. Colume status improving with CVP 5 but still 12 pounds up from  pre-op  On exam comfortable NAD JVP flat Sternal incision ok Cor IRR + rub Lungs dull at bases otherwise clear Ab soft NT Ex minimal edema. Warm  Progressing post-op but now with AF with severe RVR. Continue to load amio. Supp K and mag. Cut milrinone back to 0.25. Still with fluid on board so will continue diuresis but may need to slow diuresis rate a bit.   Glori Bickers, MD  5:12 PM

## 2018-12-24 NOTE — Progress Notes (Signed)
1320 - Patient in A-Fib RVR with heart rate 120-170's.  EKG completed to confirm and MD Prescott Gum Paged.  New orders for Amio Bolus given.  MD advised to notify MD if patient not converted to NSR 30 minutes after Amiodarone bolus completed.  1345 - Amio bolus given.  1415 - Patient remains in A-Fib RVR with no improvement in heart rate.  MD Prescott Gum paged and MD advised to give patient second Amiodarone bolus and to restart Amiodarone drip at 30 mg/hr as well as continue PO amiodarone orders.   1500 - MD Bensimhon rounding at bedside.  Patient remains in A-fib RVR with no improvement in heart rate and has NSVT runs.   MD Bensimhon discontinued PO amiodarone orders, ordered amiodarone drip at 60 mg/hr after 150 mg bolus and IVPB magnesium.

## 2018-12-24 NOTE — Progress Notes (Signed)
Patient continues to remain in a-fib RVR with runs of Vtach.  Patient asymptomatic but heart rate jumping up to 190's.   MD Bensimhon informed and new orders for 300 mg Amiodarone bolus and 4 runs of potassium.   Pharmacy unable to obtain 300 mg amiodarone bolus bag and advised to administer two 150 mg amiodarone bolus doses.  MD Hendrickson rounded bedside for evening rounds and plan of care discussed with MD Roxan Hockey.

## 2018-12-24 NOTE — Discharge Instructions (Signed)
Coronary Artery Bypass Grafting, Care After This sheet gives you information about how to care for yourself after your procedure. Your doctor may also give you more specific instructions. If you have problems or questions, call your doctor. What can I expect after the procedure? After the procedure, it is common to:  Feel sick to your stomach (nauseous).  Not want to eat as much as normal (lack of appetite).  Have trouble pooping (constipation).  Have weakness and tiredness (fatigue).  Feel sad (depressed) or grouchy (irritable).  Have pain or discomfort around the cuts from surgery (incisions). Follow these instructions at home: Medicines  Take over-the-counter and prescription medicines only as told by your doctor. Do not stop taking medicines or start any new medicines unless your doctor says it is okay.  If you were prescribed an antibiotic medicine, take it as told by your doctor. Do not stop taking the antibiotic even if you start to feel better. Incision care   Follow instructions from your doctor about how to take care of your cuts from surgery. Make sure you: ? Wash your hands with soap and water before and after you change your bandage (dressing). If you cannot use soap and water, use hand sanitizer. ? Change your bandage as told by your doctor. ? Leave stitches (sutures), skin glue, or skin tape (adhesive) strips in place. They may need to stay in place for 2 weeks or longer. If tape strips get loose and curl up, you may trim the loose edges. Do not remove tape strips completely unless your doctor says it is okay.  Make sure the surgery cuts are clean, dry, and protected.  Check your cut areas every day for signs of infection. Check for: ? More redness, swelling, or pain. ? More fluid or blood. ? Warmth. ? Pus or a bad smell.  If cuts were made in your legs: ? Avoid crossing your legs. ? Avoid sitting for long periods of time. Change positions every 30  minutes. ? Raise (elevate) your legs when you are sitting. Bathing  Do not take baths, swim, or use a hot tub until your doctor says it is okay. You may take a shower Endoscopic Saphenous Vein Harvesting, Care After This sheet gives you information about how to care for yourself after your procedure. Your health care provider may also give you more specific instructions. If you have problems or questions, contact your health care provider. What can I expect after the procedure? After the procedure, it is common to have:  Pain.  Bruising.  Swelling.  Numbness. Follow these instructions at home: Incision care   Follow instructions from your health care provider about how to take care of your incisions. Make sure you: ? Wash your hands with soap and water before and after you change your bandages (dressings). If soap and water are not available, use hand sanitizer. ? Change your dressings as told by your health care provider. ? Leave stitches (sutures), skin glue, or adhesive strips in place. These skin closures may need to stay in place for 2 weeks or longer. If adhesive strip edges start to loosen and curl up, you may trim the loose edges. Do not remove adhesive strips completely unless your health care provider tells you to do that.  Check your incision areas every day for signs of infection. Check for: ? More redness, swelling, or pain. ? Fluid or blood. ? Warmth. ? Pus or a bad smell. Medicines  Take over-the-counter and prescription medicines only  as told by your health care provider.  Ask your health care provider if the medicine prescribed to you requires you to avoid driving or using heavy machinery. General instructions  Raise (elevate) your legs above the level of your heart while you are sitting or lying down.  Avoid crossing your legs.  Avoid sitting for long periods of time. Change positions every 30 minutes.  Do any exercises your health care providers have  given you. These may include deep breathing, coughing, and walking exercises.  Do not take baths, swim, or use a hot tub until your health care provider approves. Ask your health care provider if you may take showers. You may only be allowed to take sponge baths.  Wear compression stockings as told by your health care provider. These stockings help to prevent blood clots and reduce swelling in your legs.  Keep all follow-up visits as told by your health care provider. This is important. Contact a health care provider if:  Medicine does not help your pain.  Your pain gets worse.  You have new leg bruises or your leg bruises get bigger.  Your leg feels numb.  You have more redness, swelling, or pain around your incision.  You have fluid or blood coming from your incision.  Your incision feels warm to the touch.  You have pus or a bad smell coming from your incision.  You have a fever. Get help right away if:  Your pain is severe.  You develop pain, tenderness, warmth, redness, or swelling in any part of your leg.  You have chest pain.  You have trouble breathing. Summary  Raise (elevate) your legs above the level of your heart while you are sitting or lying down.  Wear compression stockings as told by your health care provider.  Make sure you know which symptoms should prompt you to contact your health care provider.  Keep all follow-up visits as told by your health care provider. This information is not intended to replace advice given to you by your health care provider. Make sure you discuss any questions you have with your health care provider. Document Released: 12/25/2010 Document Revised: 03/22/2018 Document Reviewed: 03/22/2018 Elsevier Patient Education  2020 [place] the surgery cuts dry. Do not rub the cuts to dry. Eating and drinking   Eat foods that are high in fiber, such as beans, nuts, whole grains, and raw fruits and vegetables. Any meats  you eat should be lean cut. Avoid canned, processed, and fried foods. This can help prevent trouble pooping. This is also a part of a heart-healthy diet.  Drink enough fluid to keep your pee (urine) pale yellow.  Do not drink alcohol until you are fully recovered. Ask your doctor when it is safe to drink alcohol. Activity  Rest and limit your activity as told by your doctor. You may be told to: ? Stop any activity right away if you have chest pain, shortness of breath, irregular heartbeats, or dizziness. Get help right away if you have any of these symptoms. ? Move around often for short periods or take short walks as told by your doctor. Slowly increase your activities. ? Avoid lifting, pushing, or pulling anything that is heavier than 10 lb (4.5 kg) for at least 6 weeks or as told by your doctor.  Do physical therapy or a cardiac rehab (cardiac rehabilitation) program as told by your doctor. ? Physical therapy involves doing exercises to maintain movement and build strength and endurance. ?  A cardiac rehab program includes:  Exercise training.  Education.  Counseling.  Do not drive until your doctor says it is okay.  Ask your doctor when you can go back to work.  Ask your doctor when you can be sexually active. General instructions  Do not drive or use heavy machinery while taking prescription pain medicine.  Do not use any products that contain nicotine or tobacco. These include cigarettes, e-cigarettes, and chewing tobacco. If you need help quitting, ask your doctor.  Take 2-3 deep breaths every few hours during the day while you get better. This helps expand your lungs and prevent problems.  If you were given a device called an incentive spirometer, use it several times a day to practice deep breathing. Support your chest with a pillow or your arms when you take deep breaths or cough.  Wear compression stockings as told by your doctor.  Weigh yourself every day. This helps  to see if your body is holding (retaining) fluid that may make your heart and lungs work harder.  Keep all follow-up visits as told by your doctor. This is important. Contact a doctor if:  You have more redness, swelling, or pain around any cut.  You have more fluid or blood coming from any cut.  Any cut feels warm to the touch.  You have pus or a bad smell coming from any cut.  You have a fever.  You have swelling in your ankles or legs.  You have pain in your legs.  You gain 2 lb (0.9 kg) or more a day.  You feel sick to your stomach or you throw up (vomit).  You have watery poop (diarrhea). Get help right away if:  You have chest pain that goes to your jaw or arms.  You are short of breath.  You have a fast or irregular heartbeat.  You notice a "clicking" in your breastbone (sternum) when you move.  You have any signs of a stroke. "BE FAST" is an easy way to remember the main warning signs: ? B - Balance. Signs are dizziness, sudden trouble walking, or loss of balance. ? E - Eyes. Signs are trouble seeing or a change in how you see. ? F - Face. Signs are sudden weakness or loss of feeling of the face, or the face or eyelid drooping on one side. ? A - Arms. Signs are weakness or loss of feeling in an arm. This happens suddenly and usually on one side of the body. ? S - Speech. Signs are sudden trouble speaking, slurred speech, or trouble understanding what people say. ? T - Time. Time to call emergency services. Write down what time symptoms started.  You have other signs of a stroke, such as: ? A sudden, very bad headache with no known cause. ? Feeling sick to your stomach. ? Throwing up. ? Jerky movements you cannot control (seizure). These symptoms may be an emergency. Do not wait to see if the symptoms will go away. Get medical help right away. Call your local emergency services (911 in the [place] Do not drive yourself to the hospital. Summary  After the  procedure, it is common to have pain or discomfort in the cuts from surgery (incisions).  Do not take baths, swim, or use a hot tub until your doctor says it is okay.  Slowly increase your activities. You may need physical therapy or cardiac rehab.  Weigh yourself every day. This helps to see if your body is  holding fluid. This information is not intended to replace advice given to you by your health care provider. Make sure you discuss any questions you have with your health care provider. Document Released: 04/19/2013 Document Revised: 12/22/2017 Document Reviewed: 12/22/2017 Elsevier Patient Education  2020 [employer].

## 2018-12-24 NOTE — Progress Notes (Addendum)
      [address]       [place] [address]             [phone]      In rapid atrial fib  BP (!) 166/136   Pulse (!) 58   Temp 98.5 F (36.9 C)   Resp (!) 21   Ht 6' (1.829 m)   Wt 92 kg   SpO2 96%   BMI 27.51 kg/m   HR 145-160 atrial fib  Intake/Output Summary (Last 24 hours) at 12/24/2018 1701 Last data filed at 12/24/2018 1500 Gross per 24 hour  Intake 1077.72 ml  Output 4910 ml  Net -3832.28 ml   Has received 150 mg bolus of amiodarone x 3 and Mg Now receiving 300 mg bolus K= 3.6- 4 runs of KCl ordered  Remo Lipps C. Roxan Hockey, MD [facility] [phone]

## 2018-12-25 ENCOUNTER — Inpatient Hospital Stay (HOSPITAL_COMMUNITY): Payer: Self-pay

## 2018-12-25 LAB — GLUCOSE, CAPILLARY
Glucose-Capillary: 112 mg/dL — ABNORMAL HIGH (ref 70–99)
Glucose-Capillary: 94 mg/dL (ref 70–99)
Glucose-Capillary: 97 mg/dL (ref 70–99)
Glucose-Capillary: 90 mg/dL (ref 70–99)

## 2018-12-25 LAB — CBC
HCT: 34.1 % — ABNORMAL LOW (ref 39.0–52.0)
Hemoglobin: 11.2 g/dL — ABNORMAL LOW (ref 13.0–17.0)
MCH: 29.6 pg (ref 26.0–34.0)
MCHC: 32.8 g/dL (ref 30.0–36.0)
MCV: 90.2 fL (ref 80.0–100.0)
Platelets: 177 10*3/uL (ref 150–400)
RBC: 3.78 MIL/uL — ABNORMAL LOW (ref 4.22–5.81)
RDW: 12.7 % (ref 11.5–15.5)
WBC: 10.5 10*3/uL (ref 4.0–10.5)
nRBC: 0 % (ref 0.0–0.2)

## 2018-12-25 LAB — COMPREHENSIVE METABOLIC PANEL
ALT: 32 U/L (ref 0–44)
AST: 36 U/L (ref 15–41)
Albumin: 2.7 g/dL — ABNORMAL LOW (ref 3.5–5.0)
Alkaline Phosphatase: 50 U/L (ref 38–126)
Anion gap: 8 (ref 5–15)
BUN: 9 mg/dL (ref 6–20)
CO2: 29 mmol/L (ref 22–32)
Calcium: 8.1 mg/dL — ABNORMAL LOW (ref 8.9–10.3)
Chloride: 96 mmol/L — ABNORMAL LOW (ref 98–111)
Creatinine, Ser: 0.82 mg/dL (ref 0.61–1.24)
GFR calc Af Amer: >60 mL/min (ref >60)
GFR calc non Af Amer: >60 mL/min (ref >60)
Glucose, Bld: 105 mg/dL — ABNORMAL HIGH (ref 70–99)
Potassium: 3.6 mmol/L (ref 3.5–5.1)
Sodium: 133 mmol/L — ABNORMAL LOW (ref 135–145)
Total Bilirubin: 0.9 mg/dL (ref 0.3–1.2)
Total Protein: 5.3 g/dL — ABNORMAL LOW (ref 6.5–8.1)

## 2018-12-25 LAB — COOXEMETRY PANEL
Carboxyhemoglobin: 1 % (ref 0.5–1.5)
Methemoglobin: 0.7 % (ref 0.0–1.5)
O2 Saturation: 66 %
Total hemoglobin: 11.3 g/dL — ABNORMAL LOW (ref 12.0–16.0)

## 2018-12-25 LAB — MAGNESIUM: Magnesium: 2.3 mg/dL (ref 1.7–2.4)

## 2018-12-25 MED ORDER — POTASSIUM CHLORIDE CRYS ER 20 MEQ PO TBCR
40.0000 meq | EXTENDED_RELEASE_TABLET | Freq: Every day | ORAL | Status: DC
  Administered 2018-12-25 – 2018-12-30 (×6): 40 meq via ORAL
  Filled 2018-12-25: qty 4
  Filled 2018-12-25 (×2): qty 2
  Filled 2018-12-25 (×3): qty 4
  Filled 2018-12-25: qty 2
  Filled 2018-12-25 (×3): qty 4

## 2018-12-25 MED ORDER — POTASSIUM CHLORIDE 10 MEQ/50ML IV SOLN
10.0000 meq | INTRAVENOUS | Status: AC
  Administered 2018-12-25 (×3): 10 meq via INTRAVENOUS
  Filled 2018-12-25 (×3): qty 50

## 2018-12-25 NOTE — Progress Notes (Signed)
4 Days Post-Op Procedure(s) (LRB): CORONARY ARTERY BYPASS GRAFTING (CABG) x 3, USING LEFT INTERNAL MAMMARY ARTERY (LIMA) AND RIGHT GREATER SAPHENOUS VEIN (SVG) HARVESTED ENDOSCOPICALLY;    LIMA-LAD, SVG-RAMUS, SVG-DIAG (N/A) TRANSESOPHAGEAL ECHOCARDIOGRAM (TEE) (N/A) Subjective: Up in chair, no complaints  Objective: Vital signs in last 24 hours: Temp:  [97.5 F (36.4 C)-98.5 F (36.9 C)] 97.5 F (36.4 C) (08/29 0742) Pulse Rate:  [34-157] 88 (08/29 0800) Cardiac Rhythm: Normal sinus rhythm (08/29 0800) Resp:  [13-33] 20 (08/29 0800) BP: (89-166)/(54-136) 102/74 (08/29 0800) SpO2:  [91 %-100 %] 95 % (08/29 0800) Weight:  [89.8 kg] 89.8 kg (08/29 0500)  Hemodynamic parameters for last 24 hours: CVP:  [2 mmHg-7 mmHg] 6 mmHg  Intake/Output from previous day: 08/28 0701 - 08/29 0700 In: 1724.4 [P.O.:240; I.V.:1064.8; IV Piggyback:419.6] Out: 3160 [Urine:3000; Chest Tube:160] Intake/Output this shift: Total I/O In: 450.9 [P.O.:240; I.V.:110.9; IV Piggyback:100] Out: 10 [Chest Tube:10]  General appearance: alert, cooperative and no distress Neurologic: intact Heart: regular rate and rhythm Lungs: diminished breath sounds bibasilar Wound: clean and dry  Lab Results: Recent Labs    12/24/18 0417 12/25/18 0356  WBC 10.7* 10.5  HGB 11.3* 11.2*  HCT 34.8* 34.1*  PLT 144* 177   BMET:  Recent Labs    12/24/18 1603 12/25/18 0356  NA 132* 133*  K 3.6 3.6  CL 94* 96*  CO2 30 29  GLUCOSE 141* 105*  BUN 11 9  CREATININE 0.79 0.82  CALCIUM 8.2* 8.1*    PT/INR: No results for input(s): LABPROT, INR in the last 72 hours. ABG    Component Value Date/Time   PHART 7.355 12/22/2018 0408   HCO3 22.2 12/22/2018 0408   TCO2 23 12/22/2018 0408   ACIDBASEDEF 3.0 (H) 12/22/2018 0408   O2SAT 66.0 12/25/2018 0405   CBG (last 3)  Recent Labs    12/24/18 1544 12/24/18 2210 12/25/18 0638  GLUCAP 132* 118* 112*    Assessment/Plan: S/P Procedure(s) (LRB): CORONARY ARTERY  BYPASS GRAFTING (CABG) x 3, USING LEFT INTERNAL MAMMARY ARTERY (LIMA) AND RIGHT GREATER SAPHENOUS VEIN (SVG) HARVESTED ENDOSCOPICALLY;    LIMA-LAD, SVG-RAMUS, SVG-DIAG (N/A) TRANSESOPHAGEAL ECHOCARDIOGRAM (TEE) (N/A) -Looks good this AM CV- in SR, AF with RVR yesterday, will continue IV amiodarone today  Milrinone at 0.25- co-ox 66. per AHF team RESP- Continue IS. + air leak- keep CT in place RENAL- creatinine OK, supplement K ENDO- CBG well controlled Thrombocytopenia- improved   LOS: 14 days    Melrose Nakayama 12/25/2018

## 2018-12-25 NOTE — Progress Notes (Signed)
      [address]       [place] [address]             [phone]      Stable day  BP 102/77   Pulse 82   Temp 97.9 F (36.6 C) (Oral)   Resp (!) 24   Ht 6' (1.829 m)   Wt 89.8 kg   SpO2 95%   BMI 26.85 kg/m   Intake/Output Summary (Last 24 hours) at 12/25/2018 1817 Last data filed at 12/25/2018 1800 Gross per 24 hour  Intake 1798.53 ml  Output 1610 ml  Net 188.53 ml   Continue current Rx  Reham Slabaugh C. Roxan Hockey, MD [facility] [phone]

## 2018-12-25 NOTE — Plan of Care (Signed)
  Problem: Health Behavior/Discharge Planning: Goal: Ability to manage health-related needs will improve Outcome: Progressing   Problem: Clinical Measurements: Goal: Ability to maintain clinical measurements within normal limits will improve Outcome: Progressing Goal: Will remain free from infection Outcome: Progressing Goal: Diagnostic test results will improve Outcome: Progressing Goal: Respiratory complications will improve Outcome: Progressing Goal: Cardiovascular complication will be avoided Outcome: Progressing   Problem: Activity: Goal: Risk for activity intolerance will decrease Outcome: Progressing   Problem: Safety: Goal: Ability to remain free from injury will improve Outcome: Progressing   Problem: Skin Integrity: Goal: Risk for impaired skin integrity will decrease Outcome: Progressing   Problem: Education: Goal: Ability to demonstrate management of disease process will improve Outcome: Progressing Goal: Ability to verbalize understanding of medication therapies will improve Outcome: Progressing Goal: Individualized Educational Video(s) Outcome: Progressing   Problem: Activity: Goal: Capacity to carry out activities will improve Outcome: Progressing   Problem: Cardiac: Goal: Ability to achieve and maintain adequate cardiopulmonary perfusion will improve Description: Currently diuresing; capillary refill is warm to touch. Outcome: Progressing

## 2018-12-26 ENCOUNTER — Inpatient Hospital Stay (HOSPITAL_COMMUNITY): Payer: Self-pay

## 2018-12-26 LAB — COOXEMETRY PANEL
Carboxyhemoglobin: 1 % (ref 0.5–1.5)
Methemoglobin: 0.6 % (ref 0.0–1.5)
O2 Saturation: 48.9 %
Total hemoglobin: 11.2 g/dL — ABNORMAL LOW (ref 12.0–16.0)

## 2018-12-26 LAB — CBC
HCT: 32.4 % — ABNORMAL LOW (ref 39.0–52.0)
Hemoglobin: 10.2 g/dL — ABNORMAL LOW (ref 13.0–17.0)
MCH: 29.2 pg (ref 26.0–34.0)
MCHC: 31.5 g/dL (ref 30.0–36.0)
MCV: 92.8 fL (ref 80.0–100.0)
Platelets: 191 10*3/uL (ref 150–400)
RBC: 3.49 MIL/uL — ABNORMAL LOW (ref 4.22–5.81)
RDW: 13.2 % (ref 11.5–15.5)
WBC: 9 10*3/uL (ref 4.0–10.5)
nRBC: 0 % (ref 0.0–0.2)

## 2018-12-26 LAB — GLUCOSE, CAPILLARY
Glucose-Capillary: 90 mg/dL (ref 70–99)
Glucose-Capillary: 95 mg/dL (ref 70–99)
Glucose-Capillary: 120 mg/dL — ABNORMAL HIGH (ref 70–99)
Glucose-Capillary: 101 mg/dL — ABNORMAL HIGH (ref 70–99)

## 2018-12-26 LAB — COMPREHENSIVE METABOLIC PANEL
ALT: 42 U/L (ref 0–44)
AST: 43 U/L — ABNORMAL HIGH (ref 15–41)
Albumin: 2.5 g/dL — ABNORMAL LOW (ref 3.5–5.0)
Alkaline Phosphatase: 48 U/L (ref 38–126)
Anion gap: 10 (ref 5–15)
BUN: 10 mg/dL (ref 6–20)
CO2: 28 mmol/L (ref 22–32)
Calcium: 8 mg/dL — ABNORMAL LOW (ref 8.9–10.3)
Chloride: 94 mmol/L — ABNORMAL LOW (ref 98–111)
Creatinine, Ser: 0.86 mg/dL (ref 0.61–1.24)
GFR calc Af Amer: >60 mL/min (ref >60)
GFR calc non Af Amer: >60 mL/min (ref >60)
Glucose, Bld: 211 mg/dL — ABNORMAL HIGH (ref 70–99)
Potassium: 3.4 mmol/L — ABNORMAL LOW (ref 3.5–5.1)
Sodium: 132 mmol/L — ABNORMAL LOW (ref 135–145)
Total Bilirubin: 0.8 mg/dL (ref 0.3–1.2)
Total Protein: 5.1 g/dL — ABNORMAL LOW (ref 6.5–8.1)

## 2018-12-26 LAB — DIGOXIN LEVEL: Digoxin Level: 0.2 ng/mL — ABNORMAL LOW (ref 0.8–2.0)

## 2018-12-26 LAB — MAGNESIUM: Magnesium: 2.2 mg/dL (ref 1.7–2.4)

## 2018-12-26 MED ORDER — AMIODARONE IV BOLUS ONLY 150 MG/100ML
150.0000 mg | Freq: Once | INTRAVENOUS | Status: AC
  Administered 2018-12-26: 150 mg via INTRAVENOUS
  Filled 2018-12-26: qty 100

## 2018-12-26 MED ORDER — POTASSIUM CHLORIDE 10 MEQ/50ML IV SOLN
10.0000 meq | INTRAVENOUS | Status: AC
  Administered 2018-12-26 (×3): 10 meq via INTRAVENOUS
  Filled 2018-12-26: qty 50

## 2018-12-26 NOTE — Plan of Care (Signed)
  Problem: Health Behavior/Discharge Planning: Goal: Ability to manage health-related needs will improve Outcome: Progressing   Problem: Clinical Measurements: Goal: Ability to maintain clinical measurements within normal limits will improve Outcome: Progressing Goal: Will remain free from infection Outcome: Progressing Goal: Diagnostic test results will improve Outcome: Progressing Goal: Respiratory complications will improve Outcome: Progressing Goal: Cardiovascular complication will be avoided Outcome: Progressing   Problem: Activity: Goal: Risk for activity intolerance will decrease Outcome: Progressing   Problem: Safety: Goal: Ability to remain free from injury will improve Outcome: Progressing   Problem: Skin Integrity: Goal: Risk for impaired skin integrity will decrease Outcome: Progressing   Problem: Education: Goal: Ability to demonstrate management of disease process will improve Outcome: Progressing Goal: Ability to verbalize understanding of medication therapies will improve Outcome: Progressing Goal: Individualized Educational Video(s) Outcome: Progressing   Problem: Activity: Goal: Capacity to carry out activities will improve Outcome: Progressing   Problem: Cardiac: Goal: Ability to achieve and maintain adequate cardiopulmonary perfusion will improve Description: Currently diuresing; capillary refill is warm to touch. Outcome: Progressing

## 2018-12-26 NOTE — Progress Notes (Signed)
5 Days Post-Op Procedure(s) (LRB): CORONARY ARTERY BYPASS GRAFTING (CABG) x 3, USING LEFT INTERNAL MAMMARY ARTERY (LIMA) AND RIGHT GREATER SAPHENOUS VEIN (SVG) HARVESTED ENDOSCOPICALLY;    LIMA-LAD, SVG-RAMUS, SVG-DIAG (N/A) TRANSESOPHAGEAL ECHOCARDIOGRAM (TEE) (N/A) Subjective: No complaints this morning  Objective: Vital signs in last 24 hours: Temp:  [97.8 F (36.6 C)-98.2 F (36.8 C)] 98 F (36.7 C) (08/30 0752) Pulse Rate:  [40-102] 91 (08/30 0800) Cardiac Rhythm: Normal sinus rhythm (08/30 0800) Resp:  [10-25] 24 (08/30 0800) BP: (88-134)/(55-90) 111/90 (08/30 0800) SpO2:  [90 %-100 %] 97 % (08/30 0800) Weight:  [90.1 kg] 90.1 kg (08/30 0500)  Hemodynamic parameters for last 24 hours: CVP:  [4 mmHg-9 mmHg] 5 mmHg  Intake/Output from previous day: 08/29 0701 - 08/30 0700 In: 1908.4 [P.O.:840; I.V.:768.7; IV Piggyback:299.8] Out: 1285 [Urine:1225; Chest Tube:60] Intake/Output this shift: Total I/O In: 300.9 [P.O.:240; I.V.:26.2; IV Piggyback:34.7] Out: -   General appearance: alert, cooperative and no distress Neurologic: intact Heart: regular rate and rhythm Lungs: clear to auscultation bilaterally Abdomen: normal findings: soft, non-tender Wound: clean and dry no air leak  Lab Results: Recent Labs    12/25/18 0356 12/26/18 0207  WBC 10.5 9.0  HGB 11.2* 10.2*  HCT 34.1* 32.4*  PLT 177 191   BMET:  Recent Labs    12/25/18 0356 12/26/18 0207  NA 133* 132*  K 3.6 3.4*  CL 96* 94*  CO2 29 28  GLUCOSE 105* 211*  BUN 9 10  CREATININE 0.82 0.86  CALCIUM 8.1* 8.0*    PT/INR: No results for input(s): LABPROT, INR in the last 72 hours. ABG    Component Value Date/Time   PHART 7.355 12/22/2018 0408   HCO3 22.2 12/22/2018 0408   TCO2 23 12/22/2018 0408   ACIDBASEDEF 3.0 (H) 12/22/2018 0408   O2SAT 48.9 12/26/2018 0620   CBG (last 3)  Recent Labs    12/25/18 1544 12/25/18 2108 12/26/18 0703  GLUCAP 97 90 90    Assessment/Plan: S/P  Procedure(s) (LRB): CORONARY ARTERY BYPASS GRAFTING (CABG) x 3, USING LEFT INTERNAL MAMMARY ARTERY (LIMA) AND RIGHT GREATER SAPHENOUS VEIN (SVG) HARVESTED ENDOSCOPICALLY;    LIMA-LAD, SVG-RAMUS, SVG-DIAG (N/A) TRANSESOPHAGEAL ECHOCARDIOGRAM (TEE) (N/A) -CV- on milrinone 0.25- co-ox down from 66 to 49- was in rapid atrial fib this AM which may play a role  Atrial fib- continues to have episodes- rebolused with amiodarone this AM  Will check Digoxin level RESP- stable RENAL- creatinine stable, slightly positive I/O yesterday  Hypokalemia- replete K ENDO_ CBG well controlled Overall looks good   LOS: 15 days    Jonathan King 12/26/2018

## 2018-12-26 NOTE — Progress Notes (Signed)
TCTS Evening Rounds  Sitting up in bed talking on his phone.  He reported a good day with no new concerns.  Converted back to SR with additional amiodarone bolus and correction of K+ this AM.  Stable VS, good sats on RA.  Digoxin level 0.2 but will not alter dosing since he is holding SR now.   Antony Odea , PA-C

## 2018-12-27 ENCOUNTER — Inpatient Hospital Stay (HOSPITAL_COMMUNITY): Payer: Self-pay

## 2018-12-27 DIAGNOSIS — R911 Solitary pulmonary nodule: Secondary | ICD-10-CM

## 2018-12-27 LAB — MAGNESIUM: Magnesium: 2.1 mg/dL (ref 1.7–2.4)

## 2018-12-27 LAB — COMPREHENSIVE METABOLIC PANEL
ALT: 68 U/L — ABNORMAL HIGH (ref 0–44)
AST: 64 U/L — ABNORMAL HIGH (ref 15–41)
Albumin: 2.5 g/dL — ABNORMAL LOW (ref 3.5–5.0)
Alkaline Phosphatase: 45 U/L (ref 38–126)
Anion gap: 6 (ref 5–15)
BUN: 12 mg/dL (ref 6–20)
CO2: 27 mmol/L (ref 22–32)
Calcium: 8.3 mg/dL — ABNORMAL LOW (ref 8.9–10.3)
Chloride: 102 mmol/L (ref 98–111)
Creatinine, Ser: 0.78 mg/dL (ref 0.61–1.24)
GFR calc Af Amer: >60 mL/min (ref >60)
GFR calc non Af Amer: >60 mL/min (ref >60)
Glucose, Bld: 107 mg/dL — ABNORMAL HIGH (ref 70–99)
Potassium: 3.9 mmol/L (ref 3.5–5.1)
Sodium: 135 mmol/L (ref 135–145)
Total Bilirubin: 0.6 mg/dL (ref 0.3–1.2)
Total Protein: 5.5 g/dL — ABNORMAL LOW (ref 6.5–8.1)

## 2018-12-27 LAB — CBC
HCT: 33.3 % — ABNORMAL LOW (ref 39.0–52.0)
Hemoglobin: 10.7 g/dL — ABNORMAL LOW (ref 13.0–17.0)
MCH: 29.8 pg (ref 26.0–34.0)
MCHC: 32.1 g/dL (ref 30.0–36.0)
MCV: 92.8 fL (ref 80.0–100.0)
Platelets: 217 10*3/uL (ref 150–400)
RBC: 3.59 MIL/uL — ABNORMAL LOW (ref 4.22–5.81)
RDW: 13.5 % (ref 11.5–15.5)
WBC: 7.8 10*3/uL (ref 4.0–10.5)
nRBC: 0 % (ref 0.0–0.2)

## 2018-12-27 LAB — GLUCOSE, CAPILLARY
Glucose-Capillary: 96 mg/dL (ref 70–99)
Glucose-Capillary: 98 mg/dL (ref 70–99)
Glucose-Capillary: 122 mg/dL — ABNORMAL HIGH (ref 70–99)
Glucose-Capillary: 93 mg/dL (ref 70–99)

## 2018-12-27 LAB — COOXEMETRY PANEL
Carboxyhemoglobin: 1.1 % (ref 0.5–1.5)
Methemoglobin: 0.6 % (ref 0.0–1.5)
O2 Saturation: 65.5 %
Total hemoglobin: 10.7 g/dL — ABNORMAL LOW (ref 12.0–16.0)

## 2018-12-27 MED ORDER — ALTEPLASE 2 MG IJ SOLR
2.0000 mg | Freq: Once | INTRAMUSCULAR | Status: AC
  Administered 2018-12-27: 2 mg

## 2018-12-27 MED ORDER — SPIRONOLACTONE 25 MG PO TABS
25.0000 mg | ORAL_TABLET | Freq: Every day | ORAL | Status: DC
  Administered 2018-12-28 – 2018-12-30 (×3): 25 mg via ORAL
  Filled 2018-12-27 (×3): qty 1

## 2018-12-27 MED ORDER — LOSARTAN POTASSIUM 25 MG PO TABS
25.0000 mg | ORAL_TABLET | Freq: Every day | ORAL | Status: DC
  Administered 2018-12-27 – 2018-12-30 (×4): 25 mg via ORAL
  Filled 2018-12-27 (×4): qty 1

## 2018-12-27 MED ORDER — FUROSEMIDE 40 MG PO TABS
40.0000 mg | ORAL_TABLET | Freq: Two times a day (BID) | ORAL | Status: DC
  Administered 2018-12-27 (×2): 40 mg via ORAL
  Filled 2018-12-27 (×2): qty 1

## 2018-12-27 NOTE — Progress Notes (Signed)
TCTS evening rounds  POD #6 s/p CABG x 3 Diuresing well Ambulating in hallways Incision c/d/i; NSR  A/p: continue present management  Teddie Mehta Z. Orvan Seen, [place]

## 2018-12-27 NOTE — Progress Notes (Signed)
Pt sister, [place], called unit for update. Update given with pt approval. [place] informed RN that she was to be notified when date of discharge was known, she lives out of town and requires notice to arrive at the appropriate time. [place] states that pt is to be discharged to her care. [place] states that she is concerned pt does not fully understand his situation. [place] also states that she has never met pt's roommate and does not feel comfortable that he be in charge of pt's care once discharged. [place] may be reached at [phone].

## 2018-12-27 NOTE — Progress Notes (Addendum)
[address]       [place] [address]             [phone]      6 Days Post-Op Procedure(s) (LRB): CORONARY ARTERY BYPASS GRAFTING (CABG) x 3, USING LEFT INTERNAL MAMMARY ARTERY (LIMA) AND RIGHT GREATER SAPHENOUS VEIN (SVG) HARVESTED ENDOSCOPICALLY;    LIMA-LAD, SVG-RAMUS, SVG-DIAG (N/A) TRANSESOPHAGEAL ECHOCARDIOGRAM (TEE) (N/A) Subjective: conts to feel better, some dizziness with walking but improving  Objective: Vital signs in last 24 hours: Temp:  [97.8 F (36.6 C)-98.5 F (36.9 C)] 98.2 F (36.8 C) (08/31 0746) Pulse Rate:  [28-89] 80 (08/31 0800) Cardiac Rhythm: Normal sinus rhythm (08/31 0737) Resp:  [13-34] 23 (08/31 0800) BP: (95-122)/(62-87) 105/74 (08/31 0800) SpO2:  [88 %-100 %] 98 % (08/31 0800) Weight:  [91.2 kg] 91.2 kg (08/31 0500)  Hemodynamic parameters for last 24 hours: CVP:  [5 mmHg-10 mmHg] 5 mmHg  Intake/Output from previous day: 08/30 0701 - 08/31 0700 In: 1420.2 [P.O.:840; I.V.:545.5; IV Piggyback:34.7] Out: 1225 [Urine:1225] Intake/Output this shift: Total I/O In: 285.8 [P.O.:240; I.V.:45.8] Out: -   General appearance: alert, cooperative and no distress Heart: regular rate and rhythm and some extrasystoles Lungs: mildly dim in bases Abdomen: benign Extremities: no significant peripheral edema Wound: incis healing well  Lab Results: Recent Labs    12/26/18 0207 12/27/18 0354  WBC 9.0 7.8  HGB 10.2* 10.7*  HCT 32.4* 33.3*  PLT 191 217   BMET:  Recent Labs    12/26/18 0207 12/27/18 0354  NA 132* 135  K 3.4* 3.9  CL 94* 102  CO2 28 27  GLUCOSE 211* 107*  BUN 10 12  CREATININE 0.86 0.78  CALCIUM 8.0* 8.3*    PT/INR: No results for input(s): LABPROT, INR in the last 72 hours. ABG    Component Value Date/Time   PHART 7.355 12/22/2018 0408   HCO3 22.2 12/22/2018 0408   TCO2 23 12/22/2018 0408   ACIDBASEDEF 3.0 (H) 12/22/2018 0408   O2SAT 65.5 12/27/2018 0535   CBG (last 3)  Recent Labs   12/26/18 1615 12/26/18 2140 12/27/18 0648  GLUCAP 120* 101* 96    Meds Scheduled Meds:  aspirin EC  325 mg Oral Daily   Or   aspirin  324 mg Per Tube Daily   atorvastatin  80 mg Oral q1800   bisacodyl  10 mg Oral Daily   Or   bisacodyl  10 mg Rectal Daily   Chlorhexidine Gluconate Cloth  6 each Topical Daily   digoxin  0.125 mg Oral Daily   docusate sodium  200 mg Oral Daily   enoxaparin (LOVENOX) injection  40 mg Subcutaneous Q24H   insulin aspart  0-15 Units Subcutaneous TID WC   insulin aspart  0-5 Units Subcutaneous QHS   pantoprazole  40 mg Oral Daily   potassium chloride  40 mEq Oral Daily   simethicone  80 mg Oral QID   sodium chloride flush  10-40 mL Intracatheter Q12H   spironolactone  12.5 mg Oral Daily   Continuous Infusions:  sodium chloride Stopped (12/26/18 0927)   amiodarone 30 mg/hr (12/27/18 0800)   milrinone 0.25 mcg/kg/min (12/27/18 0800)   PRN Meds:.sodium chloride, midazolam, morphine injection, ondansetron (ZOFRAN) IV, oxyCODONE, sodium chloride flush, traMADol  Xrays Dg Chest 1v Repeat Same Day  Result Date: 12/26/2018 CLINICAL DATA:  Recent CABG.  Chest tube removal. EXAM: CHEST - 1 VIEW SAME DAY COMPARISON:  Chest x-ray from same day at  5:56 a.m. FINDINGS: Unchanged right upper extremity PICC line. Interval removal of the left-sided chest tube. No pneumothorax. No consolidation or pleural effusion. Stable mild cardiomegaly status post CABG. Unchanged subcutaneous emphysema in the left chest wall. IMPRESSION: Interval removal of the left-sided chest tube.  No pneumothorax. Electronically Signed   By: Titus Dubin M.D.   On: 12/26/2018 12:03   Dg Chest Port 1 View  Result Date: 12/27/2018 CLINICAL DATA:  CABG. EXAM: PORTABLE CHEST 1 VIEW COMPARISON:  12/26/2018. FINDINGS: Right PICC line stable position. Prior CABG. Stable cardiomegaly. Mild bibasilar atelectasis. Mild right base infiltrate cannot be excluded. No prominent pleural  effusion. No pneumothorax. IMPRESSION: 1.  Right PICC line stable position. 2.  Prior CABG.  Stable cardiomegaly. 3. Mild bibasilar atelectasis. Mild right base infiltrate cannot be excluded. Electronically Signed   By: Marcello Moores  Register   On: 12/27/2018 06:48   Dg Chest Port 1 View  Result Date: 12/26/2018 CLINICAL DATA:  Recent CABG. EXAM: PORTABLE CHEST 1 VIEW COMPARISON:  Chest x-ray from yesterday. FINDINGS: Unchanged left chest tube and right upper extremity PICC line. Stable cardiomegaly status post CABG. Normal pulmonary vascularity. Slightly improved aeration at the left lung base. No focal consolidation, pleural effusion, or pneumothorax. No acute osseous abnormality. Unchanged subcutaneous emphysema in the left chest wall. IMPRESSION: 1. Slightly improved aeration at the left lung base. Electronically Signed   By: Titus Dubin M.D.   On: 12/26/2018 09:06    Assessment/Plan: S/P Procedure(s) (LRB): CORONARY ARTERY BYPASS GRAFTING (CABG) x 3, USING LEFT INTERNAL MAMMARY ARTERY (LIMA) AND RIGHT GREATER SAPHENOUS VEIN (SVG) HARVESTED ENDOSCOPICALLY;    LIMA-LAD, SVG-RAMUS, SVG-DIAG (N/A) TRANSESOPHAGEAL ECHOCARDIOGRAM (TEE) (N/A)  1 conts to progress, in sinus with frequent PVC's, conts amio gtt 2 hemodyn stable , CVP 5, CoOx 65- AHF team managing milinone taper and diuretics 3 renal fxn normal, weight up a little today if accurate- good UOP 4 ABL anemia is stable 5 BS control is good. No DM per hx , HgA1c 6.1 6 CXR stable, without signif effus/infilts, + ATX- routine pulm toilet, wean O2 7 lovenox for DVT proph 8 rehab as able  LOS: 16 days    John Giovanni PA-C 12/27/2018 Pager [phone]  Ischemic cardiomyopathy- CABG x3  LLL pulmonary nodule- will follow postop Looks good maintaining nsr- cont iv amio today transfer to 2 C and slowly wean  Milrinone  patient examined and medical record reviewed,agree with above note. Tharon Aquas Trigt III 12/27/2018

## 2018-12-27 NOTE — Plan of Care (Signed)
  Problem: Clinical Measurements: Goal: Will remain free from infection Outcome: Progressing   Problem: Clinical Measurements: Goal: Cardiovascular complication will be avoided Outcome: Progressing   Problem: Cardiac: Goal: Ability to achieve and maintain adequate cardiopulmonary perfusion will improve Description: Currently diuresing; capillary refill is warm to touch. Outcome: Progressing

## 2018-12-27 NOTE — Plan of Care (Signed)
  Problem: Health Behavior/Discharge Planning: Goal: Ability to manage health-related needs will improve Outcome: Progressing   Problem: Clinical Measurements: Goal: Ability to maintain clinical measurements within normal limits will improve Outcome: Progressing Goal: Will remain free from infection Outcome: Progressing Goal: Diagnostic test results will improve Outcome: Progressing Goal: Respiratory complications will improve Outcome: Progressing Goal: Cardiovascular complication will be avoided Outcome: Progressing   Problem: Activity: Goal: Risk for activity intolerance will decrease Outcome: Progressing   Problem: Safety: Goal: Ability to remain free from injury will improve Outcome: Progressing   Problem: Skin Integrity: Goal: Risk for impaired skin integrity will decrease Outcome: Progressing   Problem: Education: Goal: Ability to demonstrate management of disease process will improve Outcome: Progressing Goal: Ability to verbalize understanding of medication therapies will improve Outcome: Progressing Goal: Individualized Educational Video(s) Outcome: Progressing   Problem: Activity: Goal: Capacity to carry out activities will improve Outcome: Progressing   Problem: Cardiac: Goal: Ability to achieve and maintain adequate cardiopulmonary perfusion will improve Description: Currently diuresing; capillary refill is warm to touch. Outcome: Progressing

## 2018-12-27 NOTE — Progress Notes (Addendum)
Progress Note  Patient Name: Jonathan King Date of Encounter: 12/27/2018  Primary Cardiologist: Dr. Debara Pickett Kennah Hehr  Subjective   Feels ok. Denies CP or SOB. Had recurrent AF with RVR yesterday and required another IV amio bolus. Now back in NSR.   On amio gtt and milrinone 0.25. CVP 5 . Co-ox 66% Weight still up 10 pounds.   Inpatient Medications    Scheduled Meds: . aspirin EC  325 mg Oral Daily   Or  . aspirin  324 mg Per Tube Daily  . atorvastatin  80 mg Oral q1800  . bisacodyl  10 mg Oral Daily   Or  . bisacodyl  10 mg Rectal Daily  . Chlorhexidine Gluconate Cloth  6 each Topical Daily  . digoxin  0.125 mg Oral Daily  . docusate sodium  200 mg Oral Daily  . enoxaparin (LOVENOX) injection  40 mg Subcutaneous Q24H  . furosemide  40 mg Oral BID  . insulin aspart  0-15 Units Subcutaneous TID WC  . insulin aspart  0-5 Units Subcutaneous QHS  . pantoprazole  40 mg Oral Daily  . potassium chloride  40 mEq Oral Daily  . simethicone  80 mg Oral QID  . sodium chloride flush  10-40 mL Intracatheter Q12H  . spironolactone  12.5 mg Oral Daily   Continuous Infusions: . sodium chloride Stopped (12/26/18 0927)  . amiodarone 30 mg/hr (12/27/18 1000)  . milrinone 0.125 mcg/kg/min (12/27/18 1000)   PRN Meds: sodium chloride, ondansetron (ZOFRAN) IV, oxyCODONE, sodium chloride flush, traMADol   Vital Signs    Vitals:   12/27/18 0746 12/27/18 0800 12/27/18 0916 12/27/18 1000  BP:  105/74 111/78 121/89  Pulse:  80 82 85  Resp:  (!) 23 (!) 21 (!) 26  Temp: 98.2 F (36.8 C)     TempSrc: Oral     SpO2:  98% 95% 97%  Weight:      Height:        Intake/Output Summary (Last 24 hours) at 12/27/2018 1019 Last data filed at 12/27/2018 1000 Gross per 24 hour  Intake 1267.77 ml  Output 1275 ml  Net -7.23 ml   Last 3 Weights 12/27/2018 12/26/2018 12/25/2018  Weight (lbs) 201 lb 198 lb 10.2 oz 197 lb 15.6 oz  Weight (kg) 91.173 kg 90.1 kg 89.8 kg      Telemetry    NSR 80s -  Personally Reviewed   Physical Exam   General:  Sitting up in chair. No resp difficulty HEENT: normal Neck: supple. no JVD. Carotids 2+ bilat; no bruits. No lymphadenopathy or thryomegaly appreciated. Cor: Sternal incision looks good. PMI nondisplaced. Regular rate & rhythm. No rubs, gallops or murmurs. Lungs: clear Abdomen: soft, nontender, nondistended. No hepatosplenomegaly. No bruits or masses. Good bowel sounds. Extremities: no cyanosis, clubbing, rash, edema Neuro: alert & orientedx3, cranial nerves grossly intact. moves all 4 extremities w/o difficulty. Affect pleasant   Labs    High Sensitivity Troponin:   Recent Labs  Lab 12/10/18 0248 12/10/18 0419 12/10/18 1154 12/10/18 1302  TROPONINIHS 42* 46* 41* 41*      Chemistry Recent Labs  Lab 12/25/18 0356 12/26/18 0207 12/27/18 0354  NA 133* 132* 135  K 3.6 3.4* 3.9  CL 96* 94* 102  CO2 29 28 27   GLUCOSE 105* 211* 107*  BUN 9 10 12   CREATININE 0.82 0.86 0.78  CALCIUM 8.1* 8.0* 8.3*  PROT 5.3* 5.1* 5.5*  ALBUMIN 2.7* 2.5* 2.5*  AST 36 43* 64*  ALT  32 42 68*  ALKPHOS 50 48 45  BILITOT 0.9 0.8 0.6  GFRNONAA >60 >60 >60  GFRAA >60 >60 >60  ANIONGAP 8 10 6      Hematology Recent Labs  Lab 12/25/18 0356 12/26/18 0207 12/27/18 0354  WBC 10.5 9.0 7.8  RBC 3.78* 3.49* 3.59*  HGB 11.2* 10.2* 10.7*  HCT 34.1* 32.4* 33.3*  MCV 90.2 92.8 92.8  MCH 29.6 29.2 29.8  MCHC 32.8 31.5 32.1  RDW 12.7 13.2 13.5  PLT 177 191 217    BNPNo results for input(s): BNP, PROBNP in the last 168 hours.   DDimer No results for input(s): DDIMER in the last 168 hours.   Radiology    Dg Chest 1v Repeat Same Day  Result Date: 12/26/2018 CLINICAL DATA:  Recent CABG.  Chest tube removal. EXAM: CHEST - 1 VIEW SAME DAY COMPARISON:  Chest x-ray from same day at 5:56 a.m. FINDINGS: Unchanged right upper extremity PICC line. Interval removal of the left-sided chest tube. No pneumothorax. No consolidation or pleural effusion.  Stable mild cardiomegaly status post CABG. Unchanged subcutaneous emphysema in the left chest wall. IMPRESSION: Interval removal of the left-sided chest tube.  No pneumothorax. Electronically Signed   By: Titus Dubin M.D.   On: 12/26/2018 12:03   Dg Chest Port 1 View  Result Date: 12/27/2018 CLINICAL DATA:  CABG. EXAM: PORTABLE CHEST 1 VIEW COMPARISON:  12/26/2018. FINDINGS: Right PICC line stable position. Prior CABG. Stable cardiomegaly. Mild bibasilar atelectasis. Mild right base infiltrate cannot be excluded. No prominent pleural effusion. No pneumothorax. IMPRESSION: 1.  Right PICC line stable position. 2.  Prior CABG.  Stable cardiomegaly. 3. Mild bibasilar atelectasis. Mild right base infiltrate cannot be excluded. Electronically Signed   By: Marcello Moores  Register   On: 12/27/2018 06:48   Dg Chest Port 1 View  Result Date: 12/26/2018 CLINICAL DATA:  Recent CABG. EXAM: PORTABLE CHEST 1 VIEW COMPARISON:  Chest x-ray from yesterday. FINDINGS: Unchanged left chest tube and right upper extremity PICC line. Stable cardiomegaly status post CABG. Normal pulmonary vascularity. Slightly improved aeration at the left lung base. No focal consolidation, pleural effusion, or pneumothorax. No acute osseous abnormality. Unchanged subcutaneous emphysema in the left chest wall. IMPRESSION: 1. Slightly improved aeration at the left lung base. Electronically Signed   By: Titus Dubin M.D.   On: 12/26/2018 09:06    Cardiac Studies   [facility] 12/16/18 RIGHT/LEFT HEART CATH AND CORONARY ANGIOGRAPHY  Conclusion    RPDA lesion is 70% stenosed.  Ost LAD to Prox LAD lesion is 99% stenosed.  Prox LAD to Mid LAD lesion is 100% stenosed.  1st Diag lesion is 99% stenosed.  Ramus lesion is 90% stenosed.  Findings:  Ao = 88/51 (68) LV = 86/17 RA = 3 RV = 46/5 PA = 45/10 (23) PCW = 17 Fick cardiac output/index = 4.2/2.0 PVR = 1.5 WU Ao sat = 98% PA sat = 67%, 68%  Assessment: 1. 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 Well-compensated filling pressures with moderately reduced CO  Plan/Discussion:  He has severe CAD with ischemic CM but I suspect EF also made worse by frequent PVCs. Will d/w TCTC regarding possible CABG. Will also need repeat imaging +/- biopsy or LLL pulmonary nodule.   2D Echo 12/11/18 1. The left ventricle has a visually estimated ejection fraction of 15-20%. The cavity size was moderately dilated. Findings are consistent with dilated cardiomyopathy. Left ventricular diastolic  Doppler parameters are consistent with restrictive filling. Elevated left atrial and left ventricular end-diastolic pressures Left ventricular diffuse hypokinesis. 2. The right ventricle has moderately reduced systolic function. The cavity was normal. There is no increase in right ventricular wall thickness. 3. Left atrial size was mildly dilated. 4. The mitral valve is abnormal. Mild thickening of the mitral valve leaflet. Mitral valve regurgitation is moderate by color flow Doppler. 5. The tricuspid valve is grossly normal. 6. The aortic valve is abnormal. Mild thickening of the aortic valve. Aortic valve regurgitation is trivial by color flow Doppler. No stenosis of the aortic valve. 7. The aorta is normal unless otherwise noted. 8. The inferior vena cava was dilated in size with <50% respiratory variability.   Patient Profile     53 y.o.malew/ no PMH other thancholecystectomy in 2013, whopresented w/ DOE, orthopnea, LEE and weight gain. Found to be in acute CHF. Echo showed severely reduced LVEF, 15-20%. Cath showed severe 3V CAD s/p CABG 12/21/18.Additional hospital medical problems include high burden PVCs and new diagnosis of a LLL nodule  Assessment & Plan    1. CAD s/p CABG on 12/21/18 - Doing well. No CP or SOB - Continue ASA/statin   2. Post Operative Atrial Fibrillation:  - had recurrent episode  yesterday. Broke with IV amo bolus - continue amio gtt until off milrinone.  - If recurs off milrinone. Will need AC  3. Acute Systolic CHF: - EF 06-00% - CVP only 5 but weight still up 10 pounds. Renal function stable. - start lasix 40 po bid for 1-2 days - wean milrinone to 0.125 - increase spiro to 25 - add losartan 25mg  daily. Possible switch to Entresto as BP tolerates  - continue digoxin - no b-blocker while still requiring milrinone  4. High Burden PVCs: ~ 20% burden initially. Suspect likely contributing to cardiomyopathy.  - suppressed on amio   6. Hypokalemia:  - 3.9 today  7. Hyponatremia:  - Na 135 today  8. LLL Nodule: new finding noted on chest CT 8/17. Concern for lung CA vs infection. Will need re-imaging in 3 weeks.  [facility] go [facility].   For questions or updates, please contact [place] Please consult [email or web address] for contact info under        Signed, Glori Bickers, MD  12/27/2018, 10:19 AM

## 2018-12-28 ENCOUNTER — Inpatient Hospital Stay (HOSPITAL_COMMUNITY): Payer: Self-pay

## 2018-12-28 ENCOUNTER — Encounter (HOSPITAL_COMMUNITY): Payer: Self-pay | Admitting: *Deleted

## 2018-12-28 LAB — COOXEMETRY PANEL
Carboxyhemoglobin: 1.1 % (ref 0.5–1.5)
Carboxyhemoglobin: 1.3 % (ref 0.5–1.5)
Methemoglobin: 0.7 % (ref 0.0–1.5)
Methemoglobin: 0.7 % (ref 0.0–1.5)
O2 Saturation: 56.8 %
O2 Saturation: 61.6 %
Total hemoglobin: 12.4 g/dL (ref 12.0–16.0)
Total hemoglobin: 11.8 g/dL — ABNORMAL LOW (ref 12.0–16.0)

## 2018-12-28 LAB — GLUCOSE, CAPILLARY
Glucose-Capillary: 89 mg/dL (ref 70–99)
Glucose-Capillary: 81 mg/dL (ref 70–99)
Glucose-Capillary: 109 mg/dL — ABNORMAL HIGH (ref 70–99)
Glucose-Capillary: 149 mg/dL — ABNORMAL HIGH (ref 70–99)
Glucose-Capillary: 116 mg/dL — ABNORMAL HIGH (ref 70–99)

## 2018-12-28 LAB — BASIC METABOLIC PANEL
Anion gap: 11 (ref 5–15)
BUN: 10 mg/dL (ref 6–20)
CO2: 28 mmol/L (ref 22–32)
Calcium: 8.6 mg/dL — ABNORMAL LOW (ref 8.9–10.3)
Chloride: 94 mmol/L — ABNORMAL LOW (ref 98–111)
Creatinine, Ser: 0.91 mg/dL (ref 0.61–1.24)
GFR calc Af Amer: >60 mL/min (ref >60)
GFR calc non Af Amer: >60 mL/min (ref >60)
Glucose, Bld: 175 mg/dL — ABNORMAL HIGH (ref 70–99)
Potassium: 3.8 mmol/L (ref 3.5–5.1)
Sodium: 133 mmol/L — ABNORMAL LOW (ref 135–145)

## 2018-12-28 LAB — CBC
HCT: 37 % — ABNORMAL LOW (ref 39.0–52.0)
Hemoglobin: 11.6 g/dL — ABNORMAL LOW (ref 13.0–17.0)
MCH: 29.3 pg (ref 26.0–34.0)
MCHC: 31.4 g/dL (ref 30.0–36.0)
MCV: 93.4 fL (ref 80.0–100.0)
Platelets: 262 10*3/uL (ref 150–400)
RBC: 3.96 MIL/uL — ABNORMAL LOW (ref 4.22–5.81)
RDW: 13.6 % (ref 11.5–15.5)
WBC: 8.6 10*3/uL (ref 4.0–10.5)
nRBC: 0 % (ref 0.0–0.2)

## 2018-12-28 LAB — MAGNESIUM: Magnesium: 1.9 mg/dL (ref 1.7–2.4)

## 2018-12-28 MED ORDER — SORBITOL 70 % SOLN
30.0000 mL | Freq: Once | Status: AC
  Administered 2018-12-28: 30 mL via ORAL
  Filled 2018-12-28: qty 30

## 2018-12-28 NOTE — Progress Notes (Signed)
EVENING ROUNDS NOTE :     [address]       [place] [address]             [phone]                 7 Days Post-Op Procedure(s) (LRB): CORONARY ARTERY BYPASS GRAFTING (CABG) x 3, USING LEFT INTERNAL MAMMARY ARTERY (LIMA) AND RIGHT GREATER SAPHENOUS VEIN (SVG) HARVESTED ENDOSCOPICALLY;    LIMA-LAD, SVG-RAMUS, SVG-DIAG (N/A) TRANSESOPHAGEAL ECHOCARDIOGRAM (TEE) (N/A)  Total Length of Stay:  LOS: 17 days  BP 105/77   Pulse 87   Temp 98.9 F (37.2 C) (Oral)   Resp 20   Ht 6' (1.829 m)   Wt 88.2 kg   SpO2 97%   BMI 26.37 kg/m   .Intake/Output      08/31 0701 - 09/01 0700 09/01 0701 - 09/02 0700   P.O. 480 240   I.V. (mL/kg) 487.2 (5.5) 174.6 (2)   IV Piggyback     Total Intake(mL/kg) 967.2 (11) 414.6 (4.7)   Urine (mL/kg/hr) 4175 (2) 800 (0.9)   Stool  0   Total Output 4175 800   Net -3207.8 -385.4        Urine Occurrence  2 x   Stool Occurrence  1 x     . sodium chloride Stopped (12/26/18 0927)  . amiodarone 30 mg/hr (12/28/18 0600)     Lab Results  Component Value Date   WBC 8.6 12/28/2018   HGB 11.6 (L) 12/28/2018   HCT 37.0 (L) 12/28/2018   PLT 262 12/28/2018   GLUCOSE 175 (H) 12/28/2018   CHOL 48 12/23/2018   TRIG 53 12/23/2018   HDL 25 (L) 12/23/2018   LDLCALC 12 12/23/2018   ALT 68 (H) 12/27/2018   AST 64 (H) 12/27/2018   NA 133 (L) 12/28/2018   K 3.8 12/28/2018   CL 94 (L) 12/28/2018   CREATININE 0.91 12/28/2018   BUN 10 12/28/2018   CO2 28 12/28/2018   TSH 10.994 (H) 12/18/2018   INR 1.4 (H) 12/21/2018   HGBA1C 6.1 (H) 12/21/2018   Week post op Stable talking on phonw Milrinone off   Grace Isaac MD  Beeper [phone] Office [phone] 12/28/2018 4:51 PM

## 2018-12-28 NOTE — Progress Notes (Signed)
Progress Note  Patient Name: Jonathan King Date of Encounter: 12/28/2018  Primary Cardiologist: Dr. Debara Pickett Goldie Tregoning  Subjective   Feels good. No further AF. Remains on milrinone 0.125.   No CP, SOB, orthopnea or PND. CVP 2. Co-ox 56%  No BM yet  Inpatient Medications    Scheduled Meds:  aspirin EC  325 mg Oral Daily   Or   aspirin  324 mg Per Tube Daily   atorvastatin  80 mg Oral q1800   bisacodyl  10 mg Oral Daily   Or   bisacodyl  10 mg Rectal Daily   Chlorhexidine Gluconate Cloth  6 each Topical Daily   digoxin  0.125 mg Oral Daily   docusate sodium  200 mg Oral Daily   enoxaparin (LOVENOX) injection  40 mg Subcutaneous Q24H   furosemide  40 mg Oral BID   insulin aspart  0-15 Units Subcutaneous TID WC   insulin aspart  0-5 Units Subcutaneous QHS   losartan  25 mg Oral Daily   pantoprazole  40 mg Oral Daily   potassium chloride SA  40 mEq Oral Daily   simethicone  80 mg Oral QID   sodium chloride flush  10-40 mL Intracatheter Q12H   spironolactone  25 mg Oral Daily   Continuous Infusions:  sodium chloride Stopped (12/26/18 0927)   amiodarone 30 mg/hr (12/28/18 0600)   milrinone 0.125 mcg/kg/min (12/28/18 0600)   PRN Meds: sodium chloride, ondansetron (ZOFRAN) IV, oxyCODONE, sodium chloride flush, traMADol   Vital Signs    Vitals:   12/28/18 0600 12/28/18 0619 12/28/18 0734 12/28/18 0800  BP: 103/71   108/83  Pulse: (!) 58 65  84  Resp:    20  Temp:   (!) 97.5 F (36.4 C)   TempSrc:   Oral   SpO2: 94% 95%  99%  Weight:      Height:        Intake/Output Summary (Last 24 hours) at 12/28/2018 0839 Last data filed at 12/28/2018 0800 Gross per 24 hour  Intake 961.19 ml  Output 4575 ml  Net -3613.81 ml   Last 3 Weights 12/28/2018 12/27/2018 12/26/2018  Weight (lbs) 194 lb 7.1 oz 201 lb 198 lb 10.2 oz  Weight (kg) 88.2 kg 91.173 kg 90.1 kg      Telemetry    NSR 80ss - Personally Reviewed   Physical Exam   General:  Well  appearing. No resp difficulty HEENT: normal Neck: supple. no JVD. Carotids 2+ bilat; no bruits. No lymphadenopathy or thryomegaly appreciated. Cor: sternum ok PMI nondisplaced. Regular rate & rhythm. No rubs, gallops or murmurs. Lungs: clear Abdomen: soft, nontender, nondistended. No hepatosplenomegaly. No bruits or masses. Good bowel sounds. Extremities: no cyanosis, clubbing, rash, edema Neuro: alert & orientedx3, cranial nerves grossly intact. moves all 4 extremities w/o difficulty. Affect pleasant   Labs    High Sensitivity Troponin:   Recent Labs  Lab 12/10/18 0248 12/10/18 0419 12/10/18 1154 12/10/18 1302  TROPONINIHS 42* 46* 41* 41*      Chemistry Recent Labs  Lab 12/25/18 0356 12/26/18 0207 12/27/18 0354 12/28/18 0410  NA 133* 132* 135 133*  K 3.6 3.4* 3.9 3.8  CL 96* 94* 102 94*  CO2 29 28 27 28   GLUCOSE 105* 211* 107* 175*  BUN 9 10 12 10   CREATININE 0.82 0.86 0.78 0.91  CALCIUM 8.1* 8.0* 8.3* 8.6*  PROT 5.3* 5.1* 5.5*  --   ALBUMIN 2.7* 2.5* 2.5*  --   AST 36  43* 64*  --   ALT 32 42 68*  --   ALKPHOS 50 48 45  --   BILITOT 0.9 0.8 0.6  --   GFRNONAA >60 >60 >60 >60  GFRAA >60 >60 >60 >60  ANIONGAP 8 10 6 11      Hematology Recent Labs  Lab 12/26/18 0207 12/27/18 0354 12/28/18 0410  WBC 9.0 7.8 8.6  RBC 3.49* 3.59* 3.96*  HGB 10.2* 10.7* 11.6*  HCT 32.4* 33.3* 37.0*  MCV 92.8 92.8 93.4  MCH 29.2 29.8 29.3  MCHC 31.5 32.1 31.4  RDW 13.2 13.5 13.6  PLT 191 217 262    BNPNo results for input(s): BNP, PROBNP in the last 168 hours.   DDimer No results for input(s): DDIMER in the last 168 hours.   Radiology    Dg Chest 2 View  Result Date: 12/28/2018 CLINICAL DATA:  History of CABG, no shortness of breath or chest pain EXAM: CHEST - 2 VIEW COMPARISON:  Radiograph December 27, 2018 CT December 17, 2018 FINDINGS: Right upper extremity PICC terminates in the lower SVC. Postsurgical changes related to prior CABG including intact and aligned  sternotomy wires and multiple surgical clips projecting over the mediastinum. Abandoned epicardial pacer wires are present over the cardiac silhouette. Few linear opacities overlying the upper abdomen, possibly external to the patient. Lung volumes are low with streaky areas of basilar atelectasis and left mid and lower lung scarring similar to prior studies. Persistent consolidation in the right lower lobe. IMPRESSION: 1. Right upper extremity PICC terminates in the lower SVC. 2. Postsurgical changes related to prior CABG. 3. Persistent right basilar consolidation. Electronically Signed   By: Lovena Le M.D.   On: 12/28/2018 06:28   Dg Chest 1v Repeat Same Day  Result Date: 12/26/2018 CLINICAL DATA:  Recent CABG.  Chest tube removal. EXAM: CHEST - 1 VIEW SAME DAY COMPARISON:  Chest x-ray from same day at 5:56 a.m. FINDINGS: Unchanged right upper extremity PICC line. Interval removal of the left-sided chest tube. No pneumothorax. No consolidation or pleural effusion. Stable mild cardiomegaly status post CABG. Unchanged subcutaneous emphysema in the left chest wall. IMPRESSION: Interval removal of the left-sided chest tube.  No pneumothorax. Electronically Signed   By: Titus Dubin M.D.   On: 12/26/2018 12:03   Dg Chest Port 1 View  Result Date: 12/27/2018 CLINICAL DATA:  CABG. EXAM: PORTABLE CHEST 1 VIEW COMPARISON:  12/26/2018. FINDINGS: Right PICC line stable position. Prior CABG. Stable cardiomegaly. Mild bibasilar atelectasis. Mild right base infiltrate cannot be excluded. No prominent pleural effusion. No pneumothorax. IMPRESSION: 1.  Right PICC line stable position. 2.  Prior CABG.  Stable cardiomegaly. 3. Mild bibasilar atelectasis. Mild right base infiltrate cannot be excluded. Electronically Signed   By: Marcello Moores  Register   On: 12/27/2018 06:48    Cardiac Studies   R/LHC 12/16/18 RIGHT/LEFT HEART CATH AND CORONARY ANGIOGRAPHY  Conclusion    RPDA lesion is 70% stenosed.  Ost LAD to  Prox LAD lesion is 99% stenosed.  Prox LAD to Mid LAD lesion is 100% stenosed.  1st Diag lesion is 99% stenosed.  Ramus lesion is 90% stenosed.  Findings:  Ao = 88/51 (68) LV = 86/17 RA = 3 RV = 46/5 PA = 45/10 (23) PCW = 17 Fick cardiac output/index = 4.2/2.0 PVR = 1.5 WU Ao sat = 98% PA sat = 67%, 68%  Assessment: 1. 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 Ischemic CM EF 20%  3. Well-compensated filling pressures with moderately reduced CO  Plan/Discussion:  He has severe CAD with ischemic CM but I suspect EF also made worse by frequent PVCs. Will d/w TCTC regarding possible CABG. Will also need repeat imaging +/- biopsy or LLL pulmonary nodule.   2D Echo 12/11/18 1. The left ventricle has a visually estimated ejection fraction of 15-20%. The cavity size was moderately dilated. Findings are consistent with dilated cardiomyopathy. Left ventricular diastolic Doppler parameters are consistent with restrictive filling. Elevated left atrial and left ventricular end-diastolic pressures Left ventricular diffuse hypokinesis. 2. The right ventricle has moderately reduced systolic function. The cavity was normal. There is no increase in right ventricular wall thickness. 3. Left atrial size was mildly dilated. 4. The mitral valve is abnormal. Mild thickening of the mitral valve leaflet. Mitral valve regurgitation is moderate by color flow Doppler. 5. The tricuspid valve is grossly normal. 6. The aortic valve is abnormal. Mild thickening of the aortic valve. Aortic valve regurgitation is trivial by color flow Doppler. No stenosis of the aortic valve. 7. The aorta is normal unless otherwise noted. 8. The inferior vena cava was dilated in size with <50% respiratory variability.   Patient Profile     53 y.o.malew/ no PMH other thancholecystectomy in 2013, whopresented w/ DOE, orthopnea, LEE and weight gain. Found to be in acute CHF.  Echo showed severely reduced LVEF, 15-20%. Cath showed severe 3V CAD s/p CABG 12/21/18.Additional hospital medical problems include high burden PVCs and new diagnosis of a LLL nodule  Assessment & Plan    1. CAD s/p CABG on 12/21/18 - Doing well. No CP or SOB - Continue ASA and statin  2. Post Operative Atrial Fibrillation:  - had recurrent episode on 8/30 Broke with IV amo bolus - continue amio gtt until off milrinone.  - If recurs off milrinone. Will need AC  3. Acute Systolic CHF: - EF 48-54% - CVP down to 2. Likely why co-ox has dropped. - stop lasix - continue milrinone at 0.125. Recheck co-ox later today. If > 60% can stop milrinone otherwise wait one more day - Continue spiro 25 - Continue losartan 25mg  daily. Possible switch to Entresto as BP tolerates  - continue digoxin - no b-blocker while still requiring milrinone  4. High Burden PVCs: ~ 20% burden initially. Suspect likely contributing to cardiomyopathy.  - suppressed on amio   6. Hypokalemia:  - 3.8 today  7. Hyponatremia:  - Na 133 today  8. LLL Nodule: new finding noted on chest CT 8/17. Concern for lung CA vs infection. Will need re-imaging in 3 weeks.  9. Constipation - sorbitol x 1  Can go to [facility].   For questions or updates, please contact [place] Please consult [email or web address] for contact info under        Signed, Glori Bickers, MD  12/28/2018, 8:39 AM

## 2018-12-28 NOTE — Progress Notes (Addendum)
[address]       [place] [address]             [phone]      7 Days Post-Op Procedure(s) (LRB): CORONARY ARTERY BYPASS GRAFTING (CABG) x 3, USING LEFT INTERNAL MAMMARY ARTERY (LIMA) AND RIGHT GREATER SAPHENOUS VEIN (SVG) HARVESTED ENDOSCOPICALLY;    LIMA-LAD, SVG-RAMUS, SVG-DIAG (N/A) TRANSESOPHAGEAL ECHOCARDIOGRAM (TEE) (N/A) Subjective: conts to feel better  Objective: Vital signs in last 24 hours: Temp:  [97.5 F (36.4 C)-98.6 F (37 C)] 97.5 F (36.4 C) (09/01 0734) Pulse Rate:  [58-89] 84 (09/01 0800) Cardiac Rhythm: Normal sinus rhythm (09/01 0800) Resp:  [18-26] 20 (09/01 0800) BP: (98-122)/(65-90) 108/83 (09/01 0800) SpO2:  [89 %-100 %] 99 % (09/01 0800) Weight:  [88.2 kg] 88.2 kg (09/01 0454)  Hemodynamic parameters for last 24 hours: CVP:  [2 mmHg-9 mmHg] 2 mmHg  Intake/Output from previous day: 08/31 0701 - 09/01 0700 In: 967.2 [P.O.:480; I.V.:487.2] Out: 4175 [Urine:4175] Intake/Output this shift: Total I/O In: 279.8 [P.O.:240; I.V.:39.8] Out: 600 [Urine:600]  General appearance: alert, cooperative and no distress Heart: regular rate and rhythm Lungs: clear to auscultation bilaterally Abdomen: benign Extremities: no edema Wound: incis healing well  Lab Results: Recent Labs    12/27/18 0354 12/28/18 0410  WBC 7.8 8.6  HGB 10.7* 11.6*  HCT 33.3* 37.0*  PLT 217 262   BMET:  Recent Labs    12/27/18 0354 12/28/18 0410  NA 135 133*  K 3.9 3.8  CL 102 94*  CO2 27 28  GLUCOSE 107* 175*  BUN 12 10  CREATININE 0.78 0.91  CALCIUM 8.3* 8.6*    PT/INR: No results for input(s): LABPROT, INR in the last 72 hours. ABG    Component Value Date/Time   PHART 7.355 12/22/2018 0408   HCO3 22.2 12/22/2018 0408   TCO2 23 12/22/2018 0408   ACIDBASEDEF 3.0 (H) 12/22/2018 0408   O2SAT 56.8 12/28/2018 0455   CBG (last 3)  Recent Labs    12/27/18 2159 12/28/18 0701 12/28/18 0736  GLUCAP 93 89 81    Meds Scheduled  Meds: . aspirin EC  325 mg Oral Daily   Or  . aspirin  324 mg Per Tube Daily  . atorvastatin  80 mg Oral q1800  . bisacodyl  10 mg Oral Daily   Or  . bisacodyl  10 mg Rectal Daily  . Chlorhexidine Gluconate Cloth  6 each Topical Daily  . digoxin  0.125 mg Oral Daily  . docusate sodium  200 mg Oral Daily  . enoxaparin (LOVENOX) injection  40 mg Subcutaneous Q24H  . insulin aspart  0-15 Units Subcutaneous TID WC  . insulin aspart  0-5 Units Subcutaneous QHS  . losartan  25 mg Oral Daily  . pantoprazole  40 mg Oral Daily  . potassium chloride SA  40 mEq Oral Daily  . simethicone  80 mg Oral QID  . sodium chloride flush  10-40 mL Intracatheter Q12H  . sorbitol  30 mL Oral Once  . spironolactone  25 mg Oral Daily   Continuous Infusions: . sodium chloride Stopped (12/26/18 0927)  . amiodarone 30 mg/hr (12/28/18 0600)  . milrinone 0.125 mcg/kg/min (12/28/18 0600)   PRN Meds:.sodium chloride, ondansetron (ZOFRAN) IV, oxyCODONE, sodium chloride flush, traMADol  Xrays Dg Chest 2 View  Result Date: 12/28/2018 CLINICAL DATA:  History of CABG, no shortness of breath or chest pain EXAM: CHEST - 2 VIEW COMPARISON:  Radiograph December 27, 2018 CT December 17, 2018 FINDINGS: Right upper extremity PICC terminates in the lower SVC. Postsurgical changes related to prior CABG including intact and aligned sternotomy wires and multiple surgical clips projecting over the mediastinum. Abandoned epicardial pacer wires are present over the cardiac silhouette. Few linear opacities overlying the upper abdomen, possibly external to the patient. Lung volumes are low with streaky areas of basilar atelectasis and left mid and lower lung scarring similar to prior studies. Persistent consolidation in the right lower lobe. IMPRESSION: 1. Right upper extremity PICC terminates in the lower SVC. 2. Postsurgical changes related to prior CABG. 3. Persistent right basilar consolidation. Electronically Signed   By: Lovena Le  M.D.   On: 12/28/2018 06:28   Dg Chest 1v Repeat Same Day  Result Date: 12/26/2018 CLINICAL DATA:  Recent CABG.  Chest tube removal. EXAM: CHEST - 1 VIEW SAME DAY COMPARISON:  Chest x-ray from same day at 5:56 a.m. FINDINGS: Unchanged right upper extremity PICC line. Interval removal of the left-sided chest tube. No pneumothorax. No consolidation or pleural effusion. Stable mild cardiomegaly status post CABG. Unchanged subcutaneous emphysema in the left chest wall. IMPRESSION: Interval removal of the left-sided chest tube.  No pneumothorax. Electronically Signed   By: Titus Dubin M.D.   On: 12/26/2018 12:03   Dg Chest Port 1 View  Result Date: 12/27/2018 CLINICAL DATA:  CABG. EXAM: PORTABLE CHEST 1 VIEW COMPARISON:  12/26/2018. FINDINGS: Right PICC line stable position. Prior CABG. Stable cardiomegaly. Mild bibasilar atelectasis. Mild right base infiltrate cannot be excluded. No prominent pleural effusion. No pneumothorax. IMPRESSION: 1.  Right PICC line stable position. 2.  Prior CABG.  Stable cardiomegaly. 3. Mild bibasilar atelectasis. Mild right base infiltrate cannot be excluded. Electronically Signed   By: Marcello Moores  Register   On: 12/27/2018 06:48    Assessment/Plan: S/P Procedure(s) (LRB): CORONARY ARTERY BYPASS GRAFTING (CABG) x 3, USING LEFT INTERNAL MAMMARY ARTERY (LIMA) AND RIGHT GREATER SAPHENOUS VEIN (SVG) HARVESTED ENDOSCOPICALLY;    LIMA-LAD, SVG-RAMUS, SVG-DIAG (N/A) TRANSESOPHAGEAL ECHOCARDIOGRAM (TEE) (N/A)  1 hemodyn stable in sinus rhythm, Coox 56 today- plans as outlined per AHF team for CHF management, volume overload conts to improve 2 conts amio for high burden PVC's 3 sats good on RA- cont pulm toilet/rehab 4 Mild hyponatremia, normal renal fxn- cont to monitor 5 no leukocytosis or fevers 6 H/H improved trend 7 sorbitol ordered for constipation 8 BS well controlled 9 D/C epw's today 10 lovenox dor DVT proph  LOS: 17 days    John Giovanni PA-C 12/28/2018 Pager  [phone]  Maintaining sinus rhythm-will DC epicardial pacing wires today Transition to oral amiodarone once milrinone is off and patient stable Expect discharge home -- 48 hours patient examined and medical record reviewed,agree with above note. Tharon Aquas Trigt III 12/28/2018

## 2018-12-28 NOTE — Progress Notes (Signed)
Initial Nutrition Assessment  RD working remotely.  DOCUMENTATION CODES:   Not applicable  INTERVENTION:   - MVI with minerals daily  - Encourage adequate PO intake  NUTRITION DIAGNOSIS:   Increased nutrient needs related to chronic illness (CHF) as evidenced by estimated needs.  GOAL:   Patient will meet greater than or equal to 90% of their needs  MONITOR:   PO intake, Weight trends, I & O's, Labs, Skin  REASON FOR ASSESSMENT:   LOS    ASSESSMENT:   53 year old male who presented to the ED on 8/13 with abdominal pain x 1 month and SOB. PMH of cholecystectomy. Pt admitted with new onset CHF.   8/18 - CTA suggestive of severe CAD with totally occluded LAD and high-grade ramus lesion, LLL nodule 8/20 - s/p cardiac cath showing 3v CAD with totally-occluded LAD 8/25 - s/p CABG x 3  Reviewed weight history in chart. Pt with a 8 kg weight loss in less than 2 months. This is an 8.3% weight loss which is significant for timeframe. Unsure whether weight loss is true weight loss vs fluid loss.  Per RN edema assessment, pt with non-pitting edema to BLE.  Spoke with pt via phone call to room. Pt very distracted during phone call and unable to provide much information. Pt reports that he has a good appetite and has been eating well. Pt reports that he ate well PTA but was unable to provide more details.  Pt reports that he has noticed his weight "dropping" but attributes this to diuresis and fluid status.  Pt declines oral nutrition supplements at this time. RD will order a daily MVI. Pt says he will request crackers for a snack from RN if he wants a snack.  Meal Completion: 25-75% x last 8 meals  Medications reviewed and include: Dulcolax, Colace, SSI, Protonix, K-dur 40 mEq daily, spironolactone, IV amiodarone  Labs reviewed: sodium 133, elevated LFTs CBG's: 81-122 x 24 hours  UOP: 4175 ml x 24 hours I/O's: -15.3 L since admit  NUTRITION - FOCUSED PHYSICAL  EXAM:  Unable to complete at this time. RD working remotely.  Diet Order:   Diet Order            Diet heart healthy/carb modified Room service appropriate? Yes; Fluid consistency: Thin  Diet effective now              EDUCATION NEEDS:  Education needs have been addressed  Skin:  Skin Assessment: Skin Integrity Issues: Incisions: chest, right leg  Last BM:  12/17/18  Height:   Ht Readings from Last 1 Encounters:  12/10/18 6' (1.829 m)    Weight:   Wt Readings from Last 1 Encounters:  12/28/18 88.2 kg    Ideal Body Weight:  80.9 kg  BMI:  Body mass index is 26.37 kg/m.  Estimated Nutritional Needs:   Kcal:  2200-2400  Protein:  105-120 grams  Fluid:  2.0 L    Gaynell Face, MS, RD, LDN Inpatient Clinical Dietitian Pager: [phone] Weekend/After Hours: [phone]

## 2018-12-29 LAB — COOXEMETRY PANEL
Carboxyhemoglobin: 1.1 % (ref 0.5–1.5)
Carboxyhemoglobin: 1.4 % (ref 0.5–1.5)
Methemoglobin: 0.7 % (ref 0.0–1.5)
Methemoglobin: 0.9 % (ref 0.0–1.5)
O2 Saturation: 52.9 %
O2 Saturation: 46.3 %
Total hemoglobin: 15.3 g/dL (ref 12.0–16.0)
Total hemoglobin: 12.9 g/dL (ref 12.0–16.0)

## 2018-12-29 LAB — GLUCOSE, CAPILLARY
Glucose-Capillary: 101 mg/dL — ABNORMAL HIGH (ref 70–99)
Glucose-Capillary: 95 mg/dL (ref 70–99)
Glucose-Capillary: 96 mg/dL (ref 70–99)

## 2018-12-29 LAB — BASIC METABOLIC PANEL
Anion gap: 10 (ref 5–15)
BUN: 12 mg/dL (ref 6–20)
CO2: 27 mmol/L (ref 22–32)
Calcium: 8.8 mg/dL — ABNORMAL LOW (ref 8.9–10.3)
Chloride: 97 mmol/L — ABNORMAL LOW (ref 98–111)
Creatinine, Ser: 0.92 mg/dL (ref 0.61–1.24)
GFR calc Af Amer: >60 mL/min (ref >60)
GFR calc non Af Amer: >60 mL/min (ref >60)
Glucose, Bld: 109 mg/dL — ABNORMAL HIGH (ref 70–99)
Potassium: 4.2 mmol/L (ref 3.5–5.1)
Sodium: 134 mmol/L — ABNORMAL LOW (ref 135–145)

## 2018-12-29 LAB — MAGNESIUM: Magnesium: 2.1 mg/dL (ref 1.7–2.4)

## 2018-12-29 MED ORDER — AMIODARONE HCL 200 MG PO TABS
200.0000 mg | ORAL_TABLET | Freq: Two times a day (BID) | ORAL | Status: DC
  Administered 2018-12-29 – 2018-12-30 (×3): 200 mg via ORAL
  Filled 2018-12-29 (×3): qty 1

## 2018-12-29 MED ORDER — AMIODARONE HCL 200 MG PO TABS: 400.0000 mg | ORAL_TABLET | Freq: Two times a day (BID) | ORAL | Status: DC

## 2018-12-29 NOTE — Progress Notes (Signed)
Progress Note  Patient Name: Jonathan King Date of Encounter: 12/29/2018  Primary Cardiologist: Dr. Debara Pickett Jonathan King  Subjective   Feels good. Milrinone stopped yesterday. 56% -> 53% CVP remains low. Off diuretics.   No further AF IV amio switched to po by TCTS (agree)    Good BM on sorbitol  Denies CP, SOB, orthopnea or PND  Inpatient Medications    Scheduled Meds: . amiodarone  400 mg Oral BID  . aspirin EC  325 mg Oral Daily   Or  . aspirin  324 mg Per Tube Daily  . atorvastatin  80 mg Oral q1800  . bisacodyl  10 mg Oral Daily   Or  . bisacodyl  10 mg Rectal Daily  . Chlorhexidine Gluconate Cloth  6 each Topical Daily  . digoxin  0.125 mg Oral Daily  . docusate sodium  200 mg Oral Daily  . enoxaparin (LOVENOX) injection  40 mg Subcutaneous Q24H  . insulin aspart  0-15 Units Subcutaneous TID WC  . insulin aspart  0-5 Units Subcutaneous QHS  . losartan  25 mg Oral Daily  . pantoprazole  40 mg Oral Daily  . potassium chloride SA  40 mEq Oral Daily  . simethicone  80 mg Oral QID  . sodium chloride flush  10-40 mL Intracatheter Q12H  . spironolactone  25 mg Oral Daily   Continuous Infusions: . sodium chloride Stopped (12/26/18 0927)   PRN Meds: sodium chloride, ondansetron (ZOFRAN) IV, oxyCODONE, sodium chloride flush, traMADol   Vital Signs    Vitals:   12/29/18 0600 12/29/18 0700 12/29/18 0741 12/29/18 0800  BP: 113/79 108/81  93/62  Pulse:  83  (!) 38  Resp:    20  Temp:   98.2 F (36.8 C)   TempSrc:   Oral   SpO2:  100%  94%  Weight:      Height:        Intake/Output Summary (Last 24 hours) at 12/29/2018 0824 Last data filed at 12/29/2018 0800 Gross per 24 hour  Intake 897.38 ml  Output 1475 ml  Net -577.62 ml   Last 3 Weights 12/29/2018 12/28/2018 12/27/2018  Weight (lbs) 193 lb 2 oz 194 lb 7.1 oz 201 lb  Weight (kg) 87.6 kg 88.2 kg 91.173 kg      Telemetry    NSR 80 - Personally Reviewed   Physical Exam   General:  Well appearing. No resp  difficulty HEENT: normal Neck: supple. no JVD. Carotids 2+ bilat; no bruits. No lymphadenopathy or thryomegaly appreciated. Cor: sternum ok PMI nondisplaced. Regular rate & rhythm. No rubs, gallops or murmurs. Lungs: clear Abdomen: soft, nontender, nondistended. No hepatosplenomegaly. No bruits or masses. Good bowel sounds. Extremities: no cyanosis, clubbing, rash, edema Neuro: alert & orientedx3, cranial nerves grossly intact. moves all 4 extremities w/o difficulty. Affect pleasant   Labs    High Sensitivity Troponin:   Recent Labs  Lab 12/10/18 0248 12/10/18 0419 12/10/18 1154 12/10/18 1302  TROPONINIHS 42* 46* 41* 41*      Chemistry Recent Labs  Lab 12/25/18 0356 12/26/18 0207 12/27/18 0354 12/28/18 0410 12/29/18 0410  NA 133* 132* 135 133* 134*  K 3.6 3.4* 3.9 3.8 4.2  CL 96* 94* 102 94* 97*  CO2 29 28 27 28 27   GLUCOSE 105* 211* 107* 175* 109*  BUN 9 10 12 10 12   CREATININE 0.82 0.86 0.78 0.91 0.92  CALCIUM 8.1* 8.0* 8.3* 8.6* 8.8*  PROT 5.3* 5.1* 5.5*  --   --  ALBUMIN 2.7* 2.5* 2.5*  --   --   AST 36 43* 64*  --   --   ALT 32 42 68*  --   --   ALKPHOS 50 48 45  --   --   BILITOT 0.9 0.8 0.6  --   --   GFRNONAA >60 >60 >60 >60 >60  GFRAA >60 >60 >60 >60 >60  ANIONGAP 8 10 6 11 10      Hematology Recent Labs  Lab 12/26/18 0207 12/27/18 0354 12/28/18 0410  WBC 9.0 7.8 8.6  RBC 3.49* 3.59* 3.96*  HGB 10.2* 10.7* 11.6*  HCT 32.4* 33.3* 37.0*  MCV 92.8 92.8 93.4  MCH 29.2 29.8 29.3  MCHC 31.5 32.1 31.4  RDW 13.2 13.5 13.6  PLT 191 217 262    BNPNo results for input(s): BNP, PROBNP in the last 168 hours.   DDimer No results for input(s): DDIMER in the last 168 hours.   Radiology    Dg Chest 2 View  Result Date: 12/28/2018 CLINICAL DATA:  History of CABG, no shortness of breath or chest pain EXAM: CHEST - 2 VIEW COMPARISON:  Radiograph December 27, 2018 CT December 17, 2018 FINDINGS: Right upper extremity PICC terminates in the lower SVC.  Postsurgical changes related to prior CABG including intact and aligned sternotomy wires and multiple surgical clips projecting over the mediastinum. Abandoned epicardial pacer wires are present over the cardiac silhouette. Few linear opacities overlying the upper abdomen, possibly external to the patient. Lung volumes are low with streaky areas of basilar atelectasis and left mid and lower lung scarring similar to prior studies. Persistent consolidation in the right lower lobe. IMPRESSION: 1. Right upper extremity PICC terminates in the lower SVC. 2. Postsurgical changes related to prior CABG. 3. Persistent right basilar consolidation. Electronically Signed   By: Lovena Le M.D.   On: 12/28/2018 06:28    Cardiac Studies   [facility] 12/16/18 RIGHT/LEFT HEART CATH AND CORONARY ANGIOGRAPHY  Conclusion    RPDA lesion is 70% stenosed.  Ost LAD to Prox LAD lesion is 99% stenosed.  Prox LAD to Mid LAD lesion is 100% stenosed.  1st Diag lesion is 99% stenosed.  Ramus lesion is 90% stenosed.  Findings:  Ao = 88/51 (68) LV = 86/17 RA = 3 RV = 46/5 PA = 45/10 (23) PCW = 17 Fick cardiac output/index = 4.2/2.0 PVR = 1.5 WU Ao sat = 98% PA sat = 67%, 68%  Assessment: 1. 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 Well-compensated filling pressures with moderately reduced CO  Plan/Discussion:  He has severe CAD with ischemic CM but I suspect EF also made worse by frequent PVCs. Will d/w TCTC regarding possible CABG. Will also need repeat imaging +/- biopsy or LLL pulmonary nodule.   2D Echo 12/11/18 1. The left ventricle has a visually estimated ejection fraction of 15-20%. The cavity size was moderately dilated. Findings are consistent with dilated cardiomyopathy. Left ventricular diastolic Doppler parameters are consistent with restrictive filling. Elevated left atrial and left ventricular end-diastolic pressures Left  ventricular diffuse hypokinesis. 2. The right ventricle has moderately reduced systolic function. The cavity was normal. There is no increase in right ventricular wall thickness. 3. Left atrial size was mildly dilated. 4. The mitral valve is abnormal. Mild thickening of the mitral valve leaflet. Mitral valve regurgitation is moderate by color flow Doppler. 5. The tricuspid valve is grossly normal. 6. The aortic valve  is abnormal. Mild thickening of the aortic valve. Aortic valve regurgitation is trivial by color flow Doppler. No stenosis of the aortic valve. 7. The aorta is normal unless otherwise noted. 8. The inferior vena cava was dilated in size with <50% respiratory variability.   Patient Profile     52 y.o.malew/ no PMH other thancholecystectomy in 2013, whopresented w/ DOE, orthopnea, LEE and weight gain. Found to be in acute CHF. Echo showed severely reduced LVEF, 15-20%. Cath showed severe 3V CAD s/p CABG 12/21/18.Additional hospital medical problems include high burden PVCs and new diagnosis of a LLL nodule  Assessment & Plan    1. CAD s/p CABG on 12/21/18 - Doing well. No CP or SOB - Continue ASA and statin  2. Post Operative Atrial Fibrillation:  - had recurrent episode on 8/30 Broke with IV amo bolus - now off milrinone switch to amio 200 bid -No AC for now unless AF recurs   3. Acute Systolic CHF: - EF 65-99% - CVP low. Likely why co-ox has dropped. - continue to hold lasix - Follow off milrinone. - Continue spiro 25 - Continue losartan 25mg  daily. BP too low to switch to Entresto as BP tolerates  - continue digoxin - no b-blocker while still requiring milrinone  4. High Burden PVCs: ~ 20% burden initially. Suspect likely contributing to cardiomyopathy.  - suppressed on amio   6. Hypokalemia:  - 4.2 today  7. Hyponatremia:  - Na 134 today  8. LLL Nodule: new finding noted on chest CT 8/17. Concern for lung CA vs infection. Will need re-imaging in 3  weeks.  9. Constipation - resolved with sorbitol  Can go to [place].  Hopefully home in am if co-ox stable  For questions or updates, please contact [place] Please consult [email or web address] for contact info under        Signed, Glori Bickers, MD  12/29/2018, 8:24 AM

## 2018-12-29 NOTE — Progress Notes (Signed)
EVENING ROUNDS NOTE :     [address]       [place] [address]             [phone]                 8 Days Post-Op Procedure(s) (LRB): CORONARY ARTERY BYPASS GRAFTING (CABG) x 3, USING LEFT INTERNAL MAMMARY ARTERY (LIMA) AND RIGHT GREATER SAPHENOUS VEIN (SVG) HARVESTED ENDOSCOPICALLY;    LIMA-LAD, SVG-RAMUS, SVG-DIAG (N/A) TRANSESOPHAGEAL ECHOCARDIOGRAM (TEE) (N/A)   Total Length of Stay:  LOS: 18 days  Events:  Doing well Descent uop this past shift BP still soft    BP 92/67   Pulse 64   Temp 98.9 F (37.2 C) (Oral)   Resp 20   Ht 6' (1.829 m)   Wt 87.6 kg   SpO2 92%   BMI 26.19 kg/m   CVP:  [2 mmHg-43 mmHg] 43 mmHg     . sodium chloride Stopped (12/26/18 0927)    I/O last 3 completed shifts: In: 1139.2 [P.O.:480; I.V.:659.2] Out: 3725 [MHWKG:8811]   CBC Latest Ref Rng & Units 12/28/2018 12/27/2018 12/26/2018  WBC 4.0 - 10.5 K/uL 8.6 7.8 9.0  Hemoglobin 13.0 - 17.0 g/dL 11.6(L) 10.7(L) 10.2(L)  Hematocrit 39.0 - 52.0 % 37.0(L) 33.3(L) 32.4(L)  Platelets 150 - 400 K/uL 262 217 191    BMP Latest Ref Rng & Units 12/29/2018 12/28/2018 12/27/2018  Glucose 70 - 99 mg/dL 109(H) 175(H) 107(H)  BUN 6 - 20 mg/dL 12 10 12   Creatinine 0.61 - 1.24 mg/dL 0.92 0.91 0.78  Sodium 135 - 145 mmol/L 134(L) 133(L) 135  Potassium 3.5 - 5.1 mmol/L 4.2 3.8 3.9  Chloride 98 - 111 mmol/L 97(L) 94(L) 102  CO2 22 - 32 mmol/L 27 28 27   Calcium 8.9 - 10.3 mg/dL 8.8(L) 8.6(L) 8.3(L)    ABG    Component Value Date/Time   PHART 7.355 12/22/2018 0408   PCO2ART 39.7 12/22/2018 0408   PO2ART 68.0 (L) 12/22/2018 0408   HCO3 22.2 12/22/2018 0408   TCO2 23 12/22/2018 0408   ACIDBASEDEF 3.0 (H) 12/22/2018 0408   O2SAT 46.3 12/29/2018 0920       Melodie Bouillon, MD 12/29/2018 4:06 PM

## 2018-12-29 NOTE — Progress Notes (Signed)
8 Days Post-Op Procedure(s) (LRB): CORONARY ARTERY BYPASS GRAFTING (CABG) x 3, USING LEFT INTERNAL MAMMARY ARTERY (LIMA) AND RIGHT GREATER SAPHENOUS VEIN (SVG) HARVESTED ENDOSCOPICALLY;    LIMA-LAD, SVG-RAMUS, SVG-DIAG (N/A) TRANSESOPHAGEAL ECHOCARDIOGRAM (TEE) (N/A) Subjective: Patient maintaining sinus rhythm and feels well.  Will transition from IV to oral amiodarone and observe for another 24 hours Weight down to baseline Co-ox 52% off milrinone in sinus rhythm We will transfer to stepdown and observe Possible DC home tomorrow if cardiology agrees Objective: Vital signs in last 24 hours: Temp:  [98 F (36.7 C)-98.9 F (37.2 C)] 98.2 F (36.8 C) (09/02 0741) Pulse Rate:  [38-88] 38 (09/02 0800) Cardiac Rhythm: Normal sinus rhythm (09/02 0800) Resp:  [20] 20 (09/02 0800) BP: (86-113)/(60-85) 93/62 (09/02 0800) SpO2:  [91 %-100 %] 94 % (09/02 0800) Weight:  [87.6 kg] 87.6 kg (09/02 0500)  Hemodynamic parameters for last 24 hours: CVP:  [2 mmHg-10 mmHg] 2 mmHg  Intake/Output from previous day: 09/01 0701 - 09/02 0700 In: 920.5 [P.O.:480; I.V.:440.5] Out: 1875 [Urine:1875] Intake/Output this shift: Total I/O In: 256.7 [P.O.:240; I.V.:16.7] Out: 200 [Urine:200]       Exam    General- alert and comfortable    Neck- no JVD, no cervical adenopathy palpable, no carotid bruit   Lungs- clear without rales, wheezes   Cor- regular rate and rhythm, no murmur , gallop   Abdomen- soft, non-tender   Extremities - warm, non-tender, minimal edema   Neuro- oriented, appropriate, no focal weakness   Lab Results: Recent Labs    12/27/18 0354 12/28/18 0410  WBC 7.8 8.6  HGB 10.7* 11.6*  HCT 33.3* 37.0*  PLT 217 262   BMET:  Recent Labs    12/28/18 0410 12/29/18 0410  NA 133* 134*  K 3.8 4.2  CL 94* 97*  CO2 28 27  GLUCOSE 175* 109*  BUN 10 12  CREATININE 0.91 0.92  CALCIUM 8.6* 8.8*    PT/INR: No results for input(s): LABPROT, INR in the last 72 hours. ABG     Component Value Date/Time   PHART 7.355 12/22/2018 0408   HCO3 22.2 12/22/2018 0408   TCO2 23 12/22/2018 0408   ACIDBASEDEF 3.0 (H) 12/22/2018 0408   O2SAT 52.9 12/29/2018 0420   CBG (last 3)  Recent Labs    12/28/18 1532 12/28/18 2238 12/29/18 0650  GLUCAP 149* 116* 101*    Assessment/Plan: S/P Procedure(s) (LRB): CORONARY ARTERY BYPASS GRAFTING (CABG) x 3, USING LEFT INTERNAL MAMMARY ARTERY (LIMA) AND RIGHT GREATER SAPHENOUS VEIN (SVG) HARVESTED ENDOSCOPICALLY;    LIMA-LAD, SVG-RAMUS, SVG-DIAG (N/A) TRANSESOPHAGEAL ECHOCARDIOGRAM (TEE) (N/A)   LOS: 18 days  Transition to oral amiodarone, hold Coumadin for now Transfer to stepdown Home tomorrow if patient remains stable and cardiology agrees.  Remove chest tube sutures prior to discharge tomorrow  Tharon Aquas Trigt III 12/29/2018

## 2018-12-29 NOTE — Progress Notes (Signed)
CARDIAC REHAB PHASE I   PRE:  Rate/Rhythm: 67 SR asleep    BP: sitting 103/82    SaO2: 92 RA  MODE:  Ambulation: 1520 ft   POST:  Rate/Rhythm: 103 ST    BP: sitting 114/91     SaO2: 96 RA  Came to ambulate pt on 2H as he has transfer orders. Pt reluctant to use RW instead of EVA. First two laps pt walked at an extremely fast pace with RW. He was unsafe at times due to speed so I encouraged him to try walking without RW. Pt tried x1 small step but became anxious and tearful. Next two laps pt decreased his speed and was much safer. He will need RW for home to increase his confidence and ability. To recliner, VSS after long walk. Will f/u tomorrow for education.  [place], ACSM 12/29/2018 2:26 PM

## 2018-12-29 NOTE — Care Management (Addendum)
12-29-18 1246 CM spoke with patient and he has a PCP appointment at the [facility]- 01-10-19 placed on AVS. CM reached out to [place] with [facility] at the Clinic to see if they can assist with transportation to appointment. Patient states he lives with a roommate and the roommate does not drive. Patient uses [employer]. Patient will also be able to utilize the [facility] and medications will range from $4.00-$10.00. Patient states he has insurance, however he does not have his insurance card. His sister will be in [place] 12-30-18, unsure of how long she will stay. Call placed to the [facility] to see if they can assist with transportation to that appointment listed on the AVS. CM will continue to monitor for additional transition of care needs. Bethena Roys, RN, BSN Case Manager [phone]   12-29-18 1452 CM spoke with Heart Failure Navigator and she will assist patient in getting transportation to [facility] appointments. Bethena Roys, RN,BSN Case Manager [phone]

## 2018-12-29 NOTE — Progress Notes (Signed)
[address]       [place] [address]             [phone]      8 Days Post-Op Procedure(s) (LRB): CORONARY ARTERY BYPASS GRAFTING (CABG) x 3, USING LEFT INTERNAL MAMMARY ARTERY (LIMA) AND RIGHT GREATER SAPHENOUS VEIN (SVG) HARVESTED ENDOSCOPICALLY;    LIMA-LAD, SVG-RAMUS, SVG-DIAG (N/A) TRANSESOPHAGEAL ECHOCARDIOGRAM (TEE) (N/A) Subjective: Feels pretty well  Objective: Vital signs in last 24 hours: Temp:  [98 F (36.7 C)-98.9 F (37.2 C)] 98.2 F (36.8 C) (09/02 0741) Pulse Rate:  [38-88] 38 (09/02 0800) Cardiac Rhythm: Normal sinus rhythm (09/02 0800) Resp:  [20] 20 (09/02 0800) BP: (86-113)/(60-85) 93/62 (09/02 0800) SpO2:  [91 %-100 %] 94 % (09/02 0800) Weight:  [87.6 kg] 87.6 kg (09/02 0500)  Hemodynamic parameters for last 24 hours: CVP:  [2 mmHg-10 mmHg] 2 mmHg  Intake/Output from previous day: 09/01 0701 - 09/02 0700 In: 920.5 [P.O.:480; I.V.:440.5] Out: 1875 [Urine:1875] Intake/Output this shift: Total I/O In: 256.7 [P.O.:240; I.V.:16.7] Out: 200 [Urine:200]  General appearance: alert, cooperative and no distress Heart: regular rate and rhythm Lungs: dim left base, o/w clear BS Abdomen: benign Extremities: no edema Wound: incis healing well  Lab Results: Recent Labs    12/27/18 0354 12/28/18 0410  WBC 7.8 8.6  HGB 10.7* 11.6*  HCT 33.3* 37.0*  PLT 217 262   BMET:  Recent Labs    12/28/18 0410 12/29/18 0410  NA 133* 134*  K 3.8 4.2  CL 94* 97*  CO2 28 27  GLUCOSE 175* 109*  BUN 10 12  CREATININE 0.91 0.92  CALCIUM 8.6* 8.8*    PT/INR: No results for input(s): LABPROT, INR in the last 72 hours. ABG    Component Value Date/Time   PHART 7.355 12/22/2018 0408   HCO3 22.2 12/22/2018 0408   TCO2 23 12/22/2018 0408   ACIDBASEDEF 3.0 (H) 12/22/2018 0408   O2SAT 52.9 12/29/2018 0420   CBG (last 3)  Recent Labs    12/28/18 1532 12/28/18 2238 12/29/18 0650  GLUCAP 149* 116* 101*    Meds Scheduled Meds:  . amiodarone  200 mg Oral BID  . aspirin EC  325 mg Oral Daily   Or  . aspirin  324 mg Per Tube Daily  . atorvastatin  80 mg Oral q1800  . bisacodyl  10 mg Oral Daily   Or  . bisacodyl  10 mg Rectal Daily  . Chlorhexidine Gluconate Cloth  6 each Topical Daily  . digoxin  0.125 mg Oral Daily  . docusate sodium  200 mg Oral Daily  . enoxaparin (LOVENOX) injection  40 mg Subcutaneous Q24H  . insulin aspart  0-15 Units Subcutaneous TID WC  . insulin aspart  0-5 Units Subcutaneous QHS  . losartan  25 mg Oral Daily  . pantoprazole  40 mg Oral Daily  . potassium chloride SA  40 mEq Oral Daily  . simethicone  80 mg Oral QID  . sodium chloride flush  10-40 mL Intracatheter Q12H  . spironolactone  25 mg Oral Daily   Continuous Infusions: . sodium chloride Stopped (12/26/18 0927)   PRN Meds:.sodium chloride, ondansetron (ZOFRAN) IV, oxyCODONE, sodium chloride flush, traMADol  Xrays Dg Chest 2 View  Result Date: 12/28/2018 CLINICAL DATA:  History of CABG, no shortness of breath or chest pain EXAM: CHEST - 2 VIEW COMPARISON:  Radiograph December 27, 2018 CT December 17, 2018 FINDINGS: Right upper extremity PICC terminates in the  lower SVC. Postsurgical changes related to prior CABG including intact and aligned sternotomy wires and multiple surgical clips projecting over the mediastinum. Abandoned epicardial pacer wires are present over the cardiac silhouette. Few linear opacities overlying the upper abdomen, possibly external to the patient. Lung volumes are low with streaky areas of basilar atelectasis and left mid and lower lung scarring similar to prior studies. Persistent consolidation in the right lower lobe. IMPRESSION: 1. Right upper extremity PICC terminates in the lower SVC. 2. Postsurgical changes related to prior CABG. 3. Persistent right basilar consolidation. Electronically Signed   By: Lovena Le M.D.   On: 12/28/2018 06:28    Assessment/Plan: S/P Procedure(s) (LRB): CORONARY ARTERY  BYPASS GRAFTING (CABG) x 3, USING LEFT INTERNAL MAMMARY ARTERY (LIMA) AND RIGHT GREATER SAPHENOUS VEIN (SVG) HARVESTED ENDOSCOPICALLY;    LIMA-LAD, SVG-RAMUS, SVG-DIAG (N/A) TRANSESOPHAGEAL ECHOCARDIOGRAM (TEE) (N/A)  1 doing well , BP a little soft, AHF team conts to manage, low CVP /Co-Ox went from 61-52, holding on restarting milrinone and watch, diuretics on hold 2 conts in sinus rhythm,PVC supression with amio 3 renal fxn normal 4 BS controlled 5 cont rehab and pulm toilet 6 poss home in am  LOS: 18 days    John Giovanni PA-C 12/29/2018 Pager [phone]

## 2018-12-30 LAB — BASIC METABOLIC PANEL
Anion gap: 10 (ref 5–15)
BUN: 13 mg/dL (ref 6–20)
CO2: 25 mmol/L (ref 22–32)
Calcium: 8.8 mg/dL — ABNORMAL LOW (ref 8.9–10.3)
Chloride: 98 mmol/L (ref 98–111)
Creatinine, Ser: 0.91 mg/dL (ref 0.61–1.24)
GFR calc Af Amer: >60 mL/min (ref >60)
GFR calc non Af Amer: >60 mL/min (ref >60)
Glucose, Bld: 99 mg/dL (ref 70–99)
Potassium: 4.1 mmol/L (ref 3.5–5.1)
Sodium: 133 mmol/L — ABNORMAL LOW (ref 135–145)

## 2018-12-30 LAB — COOXEMETRY PANEL
Carboxyhemoglobin: 1.3 % (ref 0.5–1.5)
Methemoglobin: 0.7 % (ref 0.0–1.5)
O2 Saturation: 53.2 %
Total hemoglobin: 12.6 g/dL (ref 12.0–16.0)

## 2018-12-30 LAB — GLUCOSE, CAPILLARY
Glucose-Capillary: 120 mg/dL — ABNORMAL HIGH (ref 70–99)
Glucose-Capillary: 93 mg/dL (ref 70–99)
Glucose-Capillary: 97 mg/dL (ref 70–99)

## 2018-12-30 LAB — MAGNESIUM: Magnesium: 2.2 mg/dL (ref 1.7–2.4)

## 2018-12-30 LAB — DIGOXIN LEVEL: Digoxin Level: 0.3 ng/mL — ABNORMAL LOW (ref 0.8–2.0)

## 2018-12-30 MED ORDER — ASPIRIN 325 MG PO TBEC: 325.0000 mg | DELAYED_RELEASE_TABLET | Freq: Every day | ORAL | Status: DC

## 2018-12-30 MED ORDER — LOSARTAN POTASSIUM 25 MG PO TABS: 25.0000 mg | ORAL_TABLET | Freq: Every day | ORAL | 1 refills | Status: DC

## 2018-12-30 MED ORDER — POTASSIUM CHLORIDE CRYS ER 10 MEQ PO TBCR: 10.0000 meq | EXTENDED_RELEASE_TABLET | Freq: Every day | ORAL | 1 refills | Status: DC

## 2018-12-30 MED ORDER — ATORVASTATIN CALCIUM 80 MG PO TABS: 80.0000 mg | ORAL_TABLET | Freq: Every day | ORAL | 1 refills | Status: DC

## 2018-12-30 MED ORDER — AMIODARONE HCL 200 MG PO TABS: 200.0000 mg | ORAL_TABLET | Freq: Two times a day (BID) | ORAL | 1 refills | Status: DC

## 2018-12-30 MED ORDER — SPIRONOLACTONE 25 MG PO TABS: 25.0000 mg | ORAL_TABLET | Freq: Every day | ORAL | 1 refills | Status: DC

## 2018-12-30 MED ORDER — TRAMADOL HCL 50 MG PO TABS: 50.0000 mg | ORAL_TABLET | Freq: Four times a day (QID) | ORAL | 0 refills | Status: AC | PRN

## 2018-12-30 MED ORDER — DIGOXIN 125 MCG PO TABS: 0.1250 mg | ORAL_TABLET | Freq: Every day | ORAL | 1 refills | Status: DC

## 2018-12-30 MED FILL — ATORVASTATIN CALCIUM 80 MG: 80 | 30 days supply | Qty: 30 | Fill #0

## 2018-12-30 MED FILL — POTASSIUM CHL ER M10 TABLET: 10 | 30 days supply | Qty: 30 | Fill #0

## 2018-12-30 MED FILL — AMIODARONE HCL 200 MG TAB: 200 | 30 days supply | Qty: 60 | Fill #0

## 2018-12-30 MED FILL — DIGOXIN 0.125 MG TABLET: 125 | 30 days supply | Qty: 30 | Fill #0

## 2018-12-30 MED FILL — traMADol HCL 50 MG TABS: 50 | 7 days supply | Qty: 28 | Fill #0

## 2018-12-30 MED FILL — SPIRONOLACTONE 25 MG TABLET: 25 | 30 days supply | Qty: 30 | Fill #0

## 2018-12-30 MED FILL — LOSARTAN POTASSIUM 25 MG TA: 25 | 30 days supply | Qty: 30 | Fill #0

## 2018-12-30 NOTE — Progress Notes (Addendum)
[address]       [place] [address]             [phone]      9 Days Post-Op Procedure(s) (LRB): CORONARY ARTERY BYPASS GRAFTING (CABG) x 3, USING LEFT INTERNAL MAMMARY ARTERY (LIMA) AND RIGHT GREATER SAPHENOUS VEIN (SVG) HARVESTED ENDOSCOPICALLY;    LIMA-LAD, SVG-RAMUS, SVG-DIAG (N/A) TRANSESOPHAGEAL ECHOCARDIOGRAM (TEE) (N/A) Subjective: Feels well  Objective: Vital signs in last 24 hours: Temp:  [96.1 F (35.6 C)-98.9 F (37.2 C)] 98 F (36.7 C) (09/03 0355) Pulse Rate:  [37-90] 72 (09/03 0700) Cardiac Rhythm: Normal sinus rhythm (09/03 0400) Resp:  [20] 20 (09/02 0800) BP: (82-108)/(49-93) 102/76 (09/03 0700) SpO2:  [90 %-100 %] 92 % (09/03 0700) Weight:  [86.3 kg] 86.3 kg (09/03 0500)  Hemodynamic parameters for last 24 hours: CVP:  [2 mmHg-43 mmHg] 43 mmHg  Intake/Output from previous day: 09/02 0701 - 09/03 0700 In: 1012.5 [P.O.:960; I.V.:52.5] Out: 2800 [Urine:2800] Intake/Output this shift: No intake/output data recorded.  General appearance: alert, cooperative and no distress Heart: regular rate and rhythm Lungs: clear to auscultation bilaterally Abdomen: benign Extremities: no edema Wound: incis healing well  Lab Results: Recent Labs    12/28/18 0410  WBC 8.6  HGB 11.6*  HCT 37.0*  PLT 262   BMET:  Recent Labs    12/29/18 0410 12/30/18 0416  NA 134* 133*  K 4.2 4.1  CL 97* 98  CO2 27 25  GLUCOSE 109* 99  BUN 12 13  CREATININE 0.92 0.91  CALCIUM 8.8* 8.8*    PT/INR: No results for input(s): LABPROT, INR in the last 72 hours. ABG    Component Value Date/Time   PHART 7.355 12/22/2018 0408   HCO3 22.2 12/22/2018 0408   TCO2 23 12/22/2018 0408   ACIDBASEDEF 3.0 (H) 12/22/2018 0408   O2SAT 46.3 12/29/2018 0920   CBG (last 3)  Recent Labs    12/29/18 1552 12/29/18 2130 12/30/18 0653  GLUCAP 120* 96 93    Meds Scheduled Meds: . amiodarone  200 mg Oral BID  . aspirin EC  325 mg Oral Daily   Or  .  aspirin  324 mg Per Tube Daily  . atorvastatin  80 mg Oral q1800  . bisacodyl  10 mg Oral Daily   Or  . bisacodyl  10 mg Rectal Daily  . Chlorhexidine Gluconate Cloth  6 each Topical Daily  . digoxin  0.125 mg Oral Daily  . docusate sodium  200 mg Oral Daily  . enoxaparin (LOVENOX) injection  40 mg Subcutaneous Q24H  . insulin aspart  0-15 Units Subcutaneous TID WC  . insulin aspart  0-5 Units Subcutaneous QHS  . losartan  25 mg Oral Daily  . pantoprazole  40 mg Oral Daily  . potassium chloride SA  40 mEq Oral Daily  . simethicone  80 mg Oral QID  . sodium chloride flush  10-40 mL Intracatheter Q12H  . spironolactone  25 mg Oral Daily   Continuous Infusions: . sodium chloride Stopped (12/26/18 0927)   PRN Meds:.sodium chloride, ondansetron (ZOFRAN) IV, oxyCODONE, sodium chloride flush, traMADol  Xrays No results found.  Assessment/Plan: S/P Procedure(s) (LRB): CORONARY ARTERY BYPASS GRAFTING (CABG) x 3, USING LEFT INTERNAL MAMMARY ARTERY (LIMA) AND RIGHT GREATER SAPHENOUS VEIN (SVG) HARVESTED ENDOSCOPICALLY;    LIMA-LAD, SVG-RAMUS, SVG-DIAG (N/A) TRANSESOPHAGEAL ECHOCARDIOGRAM (TEE) (N/A)  1 conts to do well, no new issues. If Co-Ox is good this am snd all agree  will discharge home later today 2 Bp is a bit soft at times to Syst in 80's, consider decreasing ARB. May not need spiro, will defer to AHF team  LOS: 19 days    John Giovanni PA-C 12/30/2018  Patient doing well Looks ready for DC if Cardiology agrees DC instructions reviewed with patient HHN face to face orders placed  patient examined and medical record reviewed,agree with above note. Tharon Aquas Trigt III 12/30/2018

## 2018-12-30 NOTE — Progress Notes (Signed)
Patient discharged home with sister, [place]. PICC removed by IV team. Chest tube sutures removed. Medications given to patient from [facility]. Discharge instructions gone over with patient and sister. All questions answered. VSS. No complaints.

## 2018-12-30 NOTE — Progress Notes (Signed)
Pt walked this am without RW and tolerated well. Discussed IS, sternal precautions, exercise at home, diet (low sodium), daily wts, and CRPII with pt and his sister. Voiced understanding and requests his referral be sent to [facility]. His sister is going to help him with groceries and food.  Pt is interested in participating in Virtual Cardiac and Pulmonary Rehab. Pt advised that Virtual Cardiac and Pulmonary Rehab is provided at no cost to the patient.  Checklist:  1. Pt has smart device  ie smartphone and/or ipad for downloading an app  Yes 2. Reliable internet/wifi service    Yes 3. Understands how to use their smartphone and navigate within an app.  Yes  Pt verbalized understanding and is in agreement. [place], ACSM 2:38 PM 12/30/2018

## 2018-12-30 NOTE — Progress Notes (Signed)
Spoke with patient's sister [place] earlier in the morning. She is aware that patient has orders to discharge and should arrive to the hospital between 1300 and 1400.

## 2018-12-30 NOTE — TOC Transition Note (Signed)
Transition of Care [facility]) - CM/SW Discharge Note   Patient Details  Name: Jonathan King MRN: [id number] Date of Birth: 1965-08-15  Transition of Care [facility]) CM/SW Contact:  Bethena Roys, RN Phone Number: 12/30/2018, 1:25 PM   Clinical Narrative:  Pt is without insurance at this time. MATCH used for assistance with medications. Medications will be delivered to patients room prior to transition home. Patient in need for [facility] RN assistance for disease management. Patient agreeable to services with [place]. Charity referral sent to [facility]. Patient's contact number is [phone] relayed to agency and [place] to begin within 24-48 hours post transition home. No further needs from CM @ this time.    Final next level of care: [place] Barriers to Discharge: No Barriers Identified   Patient Goals and CMS Choice Patient states their goals for this hospitalization and ongoing recovery are:: "to return home"   Choice offered to / list presented to : NA   Discharge Plan and Services In-house Referral: NA Discharge Planning Services: CM Consult Post Acute Care Choice: Home Health        HH Arranged: RN [place]: [place] ([place]) Date HH Agency Contacted: 12/30/18 Time [place]: [place] Representative spoke with at [place]: [place] Flow    Readmission Risk Interventions No flowsheet data found.

## 2018-12-30 NOTE — Progress Notes (Signed)
Progress Note  Patient Name: Jonathan King Date of Encounter: 12/30/2018  Primary Cardiologist: Dr. Debara Pickett Jonathan King  Subjective   Walking halls. Feels good. Eager to go home. Off milrinone co-ox 53%.   Maintaining NSR.  SBP 82-105   Inpatient Medications    Scheduled Meds: . amiodarone  200 mg Oral BID  . aspirin EC  325 mg Oral Daily   Or  . aspirin  324 mg Per Tube Daily  . atorvastatin  80 mg Oral q1800  . bisacodyl  10 mg Oral Daily   Or  . bisacodyl  10 mg Rectal Daily  . Chlorhexidine Gluconate Cloth  6 each Topical Daily  . digoxin  0.125 mg Oral Daily  . docusate sodium  200 mg Oral Daily  . enoxaparin (LOVENOX) injection  40 mg Subcutaneous Q24H  . insulin aspart  0-15 Units Subcutaneous TID WC  . insulin aspart  0-5 Units Subcutaneous QHS  . losartan  25 mg Oral Daily  . pantoprazole  40 mg Oral Daily  . potassium chloride SA  40 mEq Oral Daily  . simethicone  80 mg Oral QID  . sodium chloride flush  10-40 mL Intracatheter Q12H  . spironolactone  25 mg Oral Daily   Continuous Infusions: . sodium chloride Stopped (12/26/18 0927)   PRN Meds: sodium chloride, ondansetron (ZOFRAN) IV, oxyCODONE, sodium chloride flush, traMADol   Vital Signs    Vitals:   12/30/18 0600 12/30/18 0700 12/30/18 0730 12/30/18 0800  BP: (!) 95/49 102/76  105/71  Pulse: 73 72  74  Resp:  18    Temp:   98.1 F (36.7 C)   TempSrc:   Oral   SpO2: 90% 92%  95%  Weight:      Height:        Intake/Output Summary (Last 24 hours) at 12/30/2018 0835 Last data filed at 12/30/2018 0730 Gross per 24 hour  Intake 755.79 ml  Output 2825 ml  Net -2069.21 ml   Last 3 Weights 12/30/2018 12/29/2018 12/28/2018  Weight (lbs) 190 lb 4.8 oz 193 lb 2 oz 194 lb 7.1 oz  Weight (kg) 86.32 kg 87.6 kg 88.2 kg      Telemetry    NSR 70s - Personally Reviewed   Physical Exam   General:  Well appearing. No resp difficulty HEENT: normal Neck: supple. no JVD. Carotids 2+ bilat; no bruits. No  lymphadenopathy or thryomegaly appreciated. Cor: sternum looks good PMI nondisplaced. Regular rate & rhythm. No rubs, gallops or murmurs. Lungs: clear Abdomen: soft, nontender, nondistended. No hepatosplenomegaly. No bruits or masses. Good bowel sounds. Extremities: no cyanosis, clubbing, rash, edema Neuro: alert & orientedx3, cranial nerves grossly intact. moves all 4 extremities w/o difficulty. Affect pleasant   Labs    High Sensitivity Troponin:   Recent Labs  Lab 12/10/18 0248 12/10/18 0419 12/10/18 1154 12/10/18 1302  TROPONINIHS 42* 46* 41* 41*      Chemistry Recent Labs  Lab 12/25/18 0356 12/26/18 0207 12/27/18 0354 12/28/18 0410 12/29/18 0410 12/30/18 0416  NA 133* 132* 135 133* 134* 133*  K 3.6 3.4* 3.9 3.8 4.2 4.1  CL 96* 94* 102 94* 97* 98  CO2 29 28 27 28 27 25   GLUCOSE 105* 211* 107* 175* 109* 99  BUN 9 10 12 10 12 13   CREATININE 0.82 0.86 0.78 0.91 0.92 0.91  CALCIUM 8.1* 8.0* 8.3* 8.6* 8.8* 8.8*  PROT 5.3* 5.1* 5.5*  --   --   --   ALBUMIN  2.7* 2.5* 2.5*  --   --   --   AST 36 43* 64*  --   --   --   ALT 32 42 68*  --   --   --   ALKPHOS 50 48 45  --   --   --   BILITOT 0.9 0.8 0.6  --   --   --   GFRNONAA >60 >60 >60 >60 >60 >60  GFRAA >60 >60 >60 >60 >60 >60  ANIONGAP 8 10 6 11 10 10      Hematology Recent Labs  Lab 12/26/18 0207 12/27/18 0354 12/28/18 0410  WBC 9.0 7.8 8.6  RBC 3.49* 3.59* 3.96*  HGB 10.2* 10.7* 11.6*  HCT 32.4* 33.3* 37.0*  MCV 92.8 92.8 93.4  MCH 29.2 29.8 29.3  MCHC 31.5 32.1 31.4  RDW 13.2 13.5 13.6  PLT 191 217 262    BNPNo results for input(s): BNP, PROBNP in the last 168 hours.   DDimer No results for input(s): DDIMER in the last 168 hours.   Radiology    No results found.  Cardiac Studies   [facility] 12/16/18 RIGHT/LEFT HEART CATH AND CORONARY ANGIOGRAPHY  Conclusion    RPDA lesion is 70% stenosed.  Ost LAD to Prox LAD lesion is 99% stenosed.  Prox LAD to Mid LAD lesion is 100% stenosed.  1st  Diag lesion is 99% stenosed.  Ramus lesion is 90% stenosed.  Findings:  Ao = 88/51 (68) LV = 86/17 RA = 3 RV = 46/5 PA = 45/10 (23) PCW = 17 Fick cardiac output/index = 4.2/2.0 PVR = 1.5 WU Ao sat = 98% PA sat = 67%, 68%  Assessment: 1. 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 Well-compensated filling pressures with moderately reduced CO  Plan/Discussion:  He has severe CAD with ischemic CM but I suspect EF also made worse by frequent PVCs. Will d/w TCTC regarding possible CABG. Will also need repeat imaging +/- biopsy or LLL pulmonary nodule.   2D Echo 12/11/18 1. The left ventricle has a visually estimated ejection fraction of 15-20%. The cavity size was moderately dilated. Findings are consistent with dilated cardiomyopathy. Left ventricular diastolic Doppler parameters are consistent with restrictive filling. Elevated left atrial and left ventricular end-diastolic pressures Left ventricular diffuse hypokinesis. 2. The right ventricle has moderately reduced systolic function. The cavity was normal. There is no increase in right ventricular wall thickness. 3. Left atrial size was mildly dilated. 4. The mitral valve is abnormal. Mild thickening of the mitral valve leaflet. Mitral valve regurgitation is moderate by color flow Doppler. 5. The tricuspid valve is grossly normal. 6. The aortic valve is abnormal. Mild thickening of the aortic valve. Aortic valve regurgitation is trivial by color flow Doppler. No stenosis of the aortic valve. 7. The aorta is normal unless otherwise noted. 8. The inferior vena cava was dilated in size with <50% respiratory variability.   Patient Profile     53 y.o.malew/ no PMH other thancholecystectomy in 2013, whopresented w/ DOE, orthopnea, LEE and weight gain. Found to be in acute CHF. Echo showed severely reduced LVEF, 15-20%. Cath showed severe 3V CAD s/p CABG  12/21/18.Additional hospital medical problems include high burden PVCs and new diagnosis of a LLL nodule  Assessment & Plan    1. CAD s/p CABG on 12/21/18 - Doing well. No CP or SOB - Continue ASA and statin  2. Post Operative Atrial Fibrillation:  -  had recurrent episode on 8/30 Broke with IV amo bolus - maintaining NSR on amio 200 bid -No AC for now unless AF recurs   3. Acute Systolic CHF: - EF 25-00% - CVP low. Co-ox marginal at 53% - continue to hold lasix - Follow off milrinone. - Continue spiro 25 - Continue losartan 25mg  but switch to qhs. BP too low to switch to Entresto as BP tolerates  - continue digoxin - no b-blocker with low BP  4. High Burden PVCs: ~ 20% burden initially. Suspect likely contributing to cardiomyopathy.  - suppressed on amio   5. Hypokalemia:  - 4.1 today  7. Hyponatremia:  - Na 133 today  8. LLL Nodule: new finding noted on chest CT 8/17. Concern for lung CA vs infection. Will need re-imaging in 3 weeks.  9. Constipation - resolved with sorbitol  Can go to [place].  Hopefully home in am if co-ox stable  For questions or updates, please contact [place] Please consult [email or web address] for contact info under     Ok to go home today on  Amio 200 bid ECASA 81  Spiro 25 daily Losartan 25 qhs Atorvastatin 80  Digoxin 0.125  Will arrange f/u in [facility] Clinic next week. Will need repeat lung imaging soon. D/w TCTS.     Signed, Glori Bickers, MD  12/30/2018, 8:35 AM

## 2018-12-31 ENCOUNTER — Telehealth (HOSPITAL_COMMUNITY): Payer: Self-pay

## 2018-12-31 NOTE — Telephone Encounter (Signed)
Pt insurance is pending with Medicaid. Attempted to contact pt in regards to CR, LMTCB.

## 2018-12-31 NOTE — Telephone Encounter (Signed)
Pt returned CR phone and stated he does not wish to participate at this time, no other reason given.  Closed referral

## 2019-01-04 NOTE — Progress Notes (Signed)
PCP: Primary HF Cardiologist: Dr Haroldine Laws CT Surgery: Dr Darcey Nora  HPI: 53 year old patient with a history of cholecystectomy in 2013, CAD, S/P CABG, left lung nodule.   Admitted with increase shortness of breath. Echo 12/11/18 LVEF 15-20% with moderate RV dysfunction .Chest CT 8/17  with suggestion of severe CAD with totally occluded LAD and high-grade ramus lesion. Also LLL nodule suggestive of lung CA (versus infection). He underwent CABG x3 on 12/21/18. Post operative course was uneventful. He was discharged to home with [facility] on 12/30/2018.   Today he returns for post hospital follow up. Overall feeling fine but still a little sore. Has been taking tylenol as needed.  Denies SOB/PND/Orthopnea. No bleeding issues. He has been walking 8 minutes 3 times a day. Appetite ok. No fever or chills. Weight at home 185 pounds. Taking all medications. Lives with his friend, Audelia Acton.   ECHO 11/2018 EF 15%. RV moderately reduced.   [facility] 12/16/18 RIGHT/LEFT HEART CATH AND CORONARY ANGIOGRAPHY  Conclusion    RPDA lesion is 70% stenosed.  Ost LAD to Prox LAD lesion is 99% stenosed.  Prox LAD to Mid LAD lesion is 100% stenosed.  1st Diag lesion is 99% stenosed.  Ramus lesion is 90% stenosed.  Findings:  Ao = 88/51 (68) LV = 86/17 RA = 3 RV = 46/5 PA = 45/10 (23) PCW = 17 Fick cardiac output/index = 4.2/2.0 PVR = 1.5 WU Ao sat = 98% PA sat = 67%, 68%  Assessment: 1. 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 Well-compensated filling pressures with moderately reduced CO   ROS: All systems negative except as listed in HPI, PMH and Problem List.  SH:  Social History   Socioeconomic History  . Marital status: Divorced    Spouse name: Not on file  . Number of children: Not on file  . Years of education: Not on file  . Highest education level: Not on file  Occupational History  . Not on file  Social Needs  . Financial  resource strain: Not on file  . Food insecurity    Worry: Not on file    Inability: Not on file  . Transportation needs    Medical: Not on file    Non-medical: Not on file  Tobacco Use  . Smoking status: Never Smoker  . Smokeless tobacco: Never Used  Substance and Sexual Activity  . Alcohol use: Yes    Comment: occ  . Drug use: No  . Sexual activity: Not on file  Lifestyle  . Physical activity    Days per week: Not on file    Minutes per session: Not on file  . Stress: Not on file  Relationships  . Social Herbalist on phone: Not on file    Gets together: Not on file    Attends religious service: Not on file    Active member of club or organization: Not on file    Attends meetings of clubs or organizations: Not on file    Relationship status: Not on file  . Intimate partner violence    Fear of current or ex partner: Not on file    Emotionally abused: Not on file    Physically abused: Not on file    Forced sexual activity: Not on file  Other Topics Concern  . Not on file  Social History Narrative  . Not on file    FH:  Family  History  Problem Relation Age of Onset  . Parkinson's disease Mother     Past Medical History:  Diagnosis Date  . Back pain     Current Outpatient Medications  Medication Sig Dispense Refill  . amiodarone (PACERONE) 200 MG tablet Take 1 tablet (200 mg total) by mouth 2 (two) times daily. 60 tablet 1  . aspirin EC 325 MG EC tablet Take 1 tablet (325 mg total) by mouth daily.    Marland Kitchen atorvastatin (LIPITOR) 80 MG tablet Take 1 tablet (80 mg total) by mouth daily at 6 PM. 30 tablet 1  . digoxin (LANOXIN) 0.125 MG tablet Take 1 tablet (0.125 mg total) by mouth daily. 30 tablet 1  . losartan (COZAAR) 25 MG tablet Take 1 tablet (25 mg total) by mouth daily. 30 tablet 1  . potassium chloride SA (K-DUR) 10 MEQ tablet Take 1 tablet (10 mEq total) by mouth daily. 30 tablet 1  . spironolactone (ALDACTONE) 25 MG tablet Take 1 tablet (25 mg  total) by mouth daily. 30 tablet 1  . traMADol (ULTRAM) 50 MG tablet Take 1-2 tablets (50-100 mg total) by mouth every 6 (six) hours as needed for up to 7 days for moderate pain. 28 tablet 0   No current facility-administered medications for this encounter.     Vitals:   01/05/19 1007  BP: 115/74  Pulse: 80  SpO2: 98%  Weight: 86.8 kg (191 lb 6.4 oz)   Wt Readings from Last 3 Encounters:  01/05/19 86.8 kg (191 lb 6.4 oz)  12/30/18 86.3 kg (190 lb 4.8 oz)  11/11/18 96.2 kg (212 lb)    PHYSICAL EXAM: General:  Well appearing. No resp difficulty HEENT: normal Neck: supple. JVP flat. Carotids 2+ bilaterally; no bruits. No lymphadenopathy or thryomegaly appreciated. Cor: Sternal incision approximated. PMI normal. Regular rate & rhythm. No rubs, gallops or murmurs. Lungs: clear Abdomen: soft, nontender, nondistended. No hepatosplenomegaly. No bruits or masses. Good bowel sounds. Extremities: no cyanosis, clubbing, rash, edema Neuro: alert & orientedx3, cranial nerves grossly intact. Moves all 4 extremities w/o difficulty. Affect pleasant.   ECG: SR 88 bpm with frequent PVCs.    ASSESSMENT & PLAN: 1. CAD s/p CABG x3 on 12/21/18 - Continue ASA and statin -He has follow up with Dr Darcey Nora   2. Post Operative Atrial Fibrillation:  EKG today - Maintaining NSR  - Continue amio 200 bid and next visit will plan to cut back to 200 mg dailly -No AC for now unless AF recurs    3. Chronic Systolic CHF, ICM : - EF 59-97% . Plan to check ECHO in 3 months.  -NYHA II. Volume status stable. Check BMET  - Continue spiro 25 - Continue losartan 25mg  but switch to qhs. BP too low to switch to Entresto as BP tolerates  - continue digoxin - no b-blocker with low BP. Anticipate adding low dose carvedilol at that time.  - Check BMET today.   4. High Burden PVCs:  ~ 20% burden initially. Suspect likely contributing to cardiomyopathy. - suppressed on amio  - Continue current dose of amio 200  mg twice a day for now.   5.  LLL Nodule: new finding noted on chest CT 8/17. Concern for lung CA vs infection. Will need re-imaging in 3 weeks.  Follow up in 2-3 weeks. Check CBC and BMET today.   Amy Clegg NP-C  11:07 AM

## 2019-01-05 ENCOUNTER — Ambulatory Visit (HOSPITAL_COMMUNITY)
Admit: 2019-01-05 | Discharge: 2019-01-05 | Disposition: A | Discharge status: Home / Self Care | Payer: Self-pay | Attending: Cardiology | Admitting: Cardiology

## 2019-01-05 ENCOUNTER — Encounter (HOSPITAL_COMMUNITY): Payer: Self-pay

## 2019-01-05 ENCOUNTER — Other Ambulatory Visit: Payer: Self-pay

## 2019-01-05 VITALS — BP 115/74 | HR 80 | Wt 191.4 lb

## 2019-01-05 DIAGNOSIS — Z951 Presence of aortocoronary bypass graft: Secondary | ICD-10-CM | POA: Insufficient documentation

## 2019-01-05 DIAGNOSIS — I2510 Atherosclerotic heart disease of native coronary artery without angina pectoris: Secondary | ICD-10-CM | POA: Insufficient documentation

## 2019-01-05 DIAGNOSIS — Z7982 Long term (current) use of aspirin: Secondary | ICD-10-CM | POA: Insufficient documentation

## 2019-01-05 DIAGNOSIS — I472 Ventricular tachycardia: Secondary | ICD-10-CM

## 2019-01-05 DIAGNOSIS — I255 Ischemic cardiomyopathy: Secondary | ICD-10-CM | POA: Insufficient documentation

## 2019-01-05 DIAGNOSIS — R911 Solitary pulmonary nodule: Secondary | ICD-10-CM | POA: Insufficient documentation

## 2019-01-05 DIAGNOSIS — I493 Ventricular premature depolarization: Secondary | ICD-10-CM

## 2019-01-05 DIAGNOSIS — I4729 Other ventricular tachycardia: Secondary | ICD-10-CM

## 2019-01-05 DIAGNOSIS — Z79899 Other long term (current) drug therapy: Secondary | ICD-10-CM | POA: Insufficient documentation

## 2019-01-05 DIAGNOSIS — I4891 Unspecified atrial fibrillation: Secondary | ICD-10-CM | POA: Insufficient documentation

## 2019-01-05 DIAGNOSIS — I5022 Chronic systolic (congestive) heart failure: Secondary | ICD-10-CM | POA: Insufficient documentation

## 2019-01-05 DIAGNOSIS — I480 Paroxysmal atrial fibrillation: Secondary | ICD-10-CM

## 2019-01-05 LAB — BASIC METABOLIC PANEL
Anion gap: 12 (ref 5–15)
BUN: 18 mg/dL (ref 6–20)
CO2: 22 mmol/L (ref 22–32)
Calcium: 9.3 mg/dL (ref 8.9–10.3)
Chloride: 100 mmol/L (ref 98–111)
Creatinine, Ser: 1.18 mg/dL (ref 0.61–1.24)
GFR calc Af Amer: >60 mL/min (ref >60)
GFR calc non Af Amer: >60 mL/min (ref >60)
Glucose, Bld: 139 mg/dL — ABNORMAL HIGH (ref 70–99)
Potassium: 4.4 mmol/L (ref 3.5–5.1)
Sodium: 134 mmol/L — ABNORMAL LOW (ref 135–145)

## 2019-01-05 LAB — CBC
HCT: 42.6 % (ref 39.0–52.0)
Hemoglobin: 13.5 g/dL (ref 13.0–17.0)
MCH: 29.8 pg (ref 26.0–34.0)
MCHC: 31.7 g/dL (ref 30.0–36.0)
MCV: 94 fL (ref 80.0–100.0)
Platelets: 437 10*3/uL — ABNORMAL HIGH (ref 150–400)
RBC: 4.53 MIL/uL (ref 4.22–5.81)
RDW: 13.8 % (ref 11.5–15.5)
WBC: 13.7 10*3/uL — ABNORMAL HIGH (ref 4.0–10.5)
nRBC: 0 % (ref 0.0–0.2)

## 2019-01-05 NOTE — Patient Instructions (Signed)
It was great to see you today! No medication changes are needed at this time.  Your physician recommends that you schedule a follow-up appointment in: 2-3 weeks with Amy Clegg,NP  Do the following things EVERYDAY: 1) Weigh yourself in the morning before breakfast. Write it down and keep it in a log. 2) Take your medicines as prescribed 3) Eat low salt foods-Limit salt (sodium) to 2000 mg per day.  4) Stay as active as you can everyday 5) Limit all fluids for the day to less than 2 liters   At the [facility], you and your health needs are our priority. As part of our continuing mission to provide you with exceptional heart care, we have created designated Provider Care Teams. These Care Teams include your primary Cardiologist (physician) and Advanced Practice Providers (APPs- Physician Assistants and Nurse Practitioners) who all work together to provide you with the care you need, when you need it.   You may see any of the following providers on your designated Care Team at your next follow up: Marland Kitchen Dr Glori Bickers . Dr Loralie Champagne . Darrick Grinder, NP   Please be sure to bring in all your medications bottles to every appointment.

## 2019-01-10 ENCOUNTER — Ambulatory Visit: Payer: Self-pay | Attending: Family Medicine | Admitting: Family Medicine

## 2019-01-10 ENCOUNTER — Other Ambulatory Visit: Payer: Self-pay

## 2019-01-10 ENCOUNTER — Encounter: Payer: Self-pay | Admitting: Family Medicine

## 2019-01-10 VITALS — BP 114/76 | HR 64 | Temp 98.5°F | Ht 72.0 in | Wt 191.0 lb

## 2019-01-10 DIAGNOSIS — R7303 Prediabetes: Secondary | ICD-10-CM

## 2019-01-10 DIAGNOSIS — Z951 Presence of aortocoronary bypass graft: Secondary | ICD-10-CM

## 2019-01-10 DIAGNOSIS — I5041 Acute combined systolic (congestive) and diastolic (congestive) heart failure: Secondary | ICD-10-CM

## 2019-01-10 DIAGNOSIS — Z0000 Encounter for general adult medical examination without abnormal findings: Secondary | ICD-10-CM

## 2019-01-10 DIAGNOSIS — R911 Solitary pulmonary nodule: Secondary | ICD-10-CM

## 2019-01-10 NOTE — Patient Instructions (Signed)

## 2019-01-10 NOTE — Progress Notes (Signed)
New Patient Office Visit  Subjective:  Patient ID: Jonathan King, male    DOB: 09/16/1965  Age: 53 y.o. MRN: [id number]  CC:  Chief Complaint  Patient presents with  . Hospitalization Follow-up    HPI Jonathan King presents for a hospital follow up and to establish care.  He has a past medical history significant for CAD, GERD, prediabetes, acute systolic and diastolic heart failure, left pulmonary nodule, and CABG x3.  Recently discharged from a 3 week hospital admission with acute congestive heart failure.  Left ventricle EF 20-25%.  He underwent CABG x 3 on 12/21/2018 and had an uneventful recovery.  Discharged 12/30/2018.  Left pulmonary nodule was noted during hospital admission.  Cardiology plans to recheck imaging studies in three weeks to differentiate between infection and malignancy.  HgbA1c was noted to be 6.1 on 12/21/2018 which places the patient in the prediabetic category.  Today he presents to establish care with his live in caretaker, Audelia Acton.  No acute complaints today.  He is attempting to follow a low sodium diet and increase his physical activity as recommended.  Past Medical History:  Diagnosis Date  . Back pain     Past Surgical History:  Procedure Laterality Date  . CHOLECYSTECTOMY  01/31/2012   Procedure: LAPAROSCOPIC CHOLECYSTECTOMY;  Surgeon: Gayland Curry, MD,FACS;  Location: [place];  Service: General;  Laterality: N/A;  . CORONARY ARTERY BYPASS GRAFT N/A 12/21/2018   Procedure: CORONARY ARTERY BYPASS GRAFTING (CABG) x 3, USING LEFT INTERNAL MAMMARY ARTERY (LIMA) AND RIGHT GREATER SAPHENOUS VEIN (SVG) HARVESTED ENDOSCOPICALLY;    LIMA-LAD, SVG-RAMUS, SVG-DIAG;  Surgeon: Ivin Poot, MD;  Location: [place];  Service: Open Heart Surgery;  Laterality: N/A;  . RIGHT/LEFT HEART CATH AND CORONARY ANGIOGRAPHY N/A 12/16/2018   Procedure: RIGHT/LEFT HEART CATH AND CORONARY ANGIOGRAPHY;  Surgeon: Jolaine Artist, MD;  Location: [place] [facility];  Service:  Cardiovascular;  Laterality: N/A;  . TEE WITHOUT CARDIOVERSION N/A 12/21/2018   Procedure: TRANSESOPHAGEAL ECHOCARDIOGRAM (TEE);  Surgeon: Prescott Gum, Collier Salina, MD;  Location: [facility];  Service: Open Heart Surgery;  Laterality: N/A;    Family History  Problem Relation Age of Onset  . Parkinson's disease Mother     Social History   Socioeconomic History  . Marital status: Divorced    Spouse name: Not on file  . Number of children: Not on file  . Years of education: Not on file  . Highest education level: Not on file  Occupational History  . Not on file  Social Needs  . Financial resource strain: Not on file  . Food insecurity    Worry: Not on file    Inability: Not on file  . Transportation needs    Medical: Not on file    Non-medical: Not on file  Tobacco Use  . Smoking status: Never Smoker  . Smokeless tobacco: Never Used  Substance and Sexual Activity  . Alcohol use: Yes    Comment: occ  . Drug use: No  . Sexual activity: Not on file  Lifestyle  . Physical activity    Days per week: Not on file    Minutes per session: Not on file  . Stress: Not on file  Relationships  . Social Herbalist on phone: Not on file    Gets together: Not on file    Attends religious service: Not on file    Active member of club or organization: Not on file  Attends meetings of clubs or organizations: Not on file    Relationship status: Not on file  . Intimate partner violence    Fear of current or ex partner: Not on file    Emotionally abused: Not on file    Physically abused: Not on file    Forced sexual activity: Not on file  Other Topics Concern  . Not on file  Social History Narrative  . Not on file    ROS Review of Systems  Constitutional: Negative for fatigue, fever and unexpected weight change.  HENT: Negative for congestion, rhinorrhea, sinus pressure and sinus pain.   Eyes: Negative for visual disturbance.  Respiratory: Negative for cough, chest tightness and  shortness of breath.   Cardiovascular: Negative for chest pain, palpitations and leg swelling.  Gastrointestinal: Negative for abdominal distention, abdominal pain, constipation, diarrhea, nausea and vomiting.  Endocrine: Negative for polydipsia and polyuria.  Genitourinary: Negative for decreased urine volume, difficulty urinating and dysuria.  Musculoskeletal: Negative for arthralgias and myalgias.  Skin: Positive for wound. Negative for color change and rash.       Soreness with midline sternal incision from recent CABG.    Neurological: Negative for dizziness, tremors, weakness and numbness.  Hematological: Does not bruise/bleed easily.  Psychiatric/Behavioral: Negative for agitation and behavioral problems.    Objective:   Today's Vitals: BP 114/76   Pulse 64   Temp 98.5 F (36.9 C) (Oral)   Ht 6' (1.829 m)   Wt 191 lb (86.6 kg)   SpO2 96%   BMI 25.90 kg/m   Physical Exam Vitals signs reviewed.  Constitutional:      General: He is not in acute distress.    Appearance: Normal appearance.  HENT:     Head: Normocephalic.  Eyes:     Pupils: Pupils are equal, round, and reactive to light.  Neck:     Musculoskeletal: Normal range of motion and neck supple.  Cardiovascular:     Rate and Rhythm: Normal rate and regular rhythm.     Pulses: Normal pulses.     Heart sounds: Normal heart sounds. No murmur.  Pulmonary:     Effort: Pulmonary effort is normal. No respiratory distress.     Breath sounds: Normal breath sounds.  Chest:     Chest wall: Tenderness present.     Comments: Healing midline incision with well approximated edges. Abdominal:     General: Bowel sounds are normal. There is no distension.     Palpations: Abdomen is soft.     Tenderness: There is no guarding.  Musculoskeletal:     Right lower leg: No edema.     Left lower leg: No edema.  Skin:    General: Skin is warm and dry.     Findings: Bruising present.  Neurological:     Mental Status: He is  alert and oriented to person, place, and time.  Psychiatric:        Mood and Affect: Mood normal.        Behavior: Behavior normal.     Assessment & Plan:   1. 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 Euvolemic.  NYHA class II  EF 20-25% Continue spironolactone 25 mg Continue losartan 25 mg qhs Continue digoxin 0.125 mg Unable to start Entresto or a beta blocker at this time due to low BP Managed by [facility] Next appointment in 3 weeks  2. S/P CABG x 3 Stable. Continue aspirin  325 mg Continue atorvastatin 80 mg No acute complaints.  Incision is healing well with well approximated edges.  3. Solitary pulmonary nodule New finding noted 8/17. Re-imaging in 3 weeks to access for infection vs malignancy. Followed by cardiology.  4. Prediabetes New diagnosis. Educated today on diabetic diet using the MyPlate method with instructions to call with any questions.  Educated on the importance of 150 minutes of moderate intensity exercise per week.  Recheck HgbA1c in 3 months.  5. Healthcare maintenance Received the annual influenza vaccination today. Not interested in considering colonoscopy.  - Fecal occult blood, imunochemical([facility])  Outpatient Encounter Medications as of 01/10/2019  Medication Sig  . amiodarone (PACERONE) 200 MG tablet Take 1 tablet (200 mg total) by mouth 2 (two) times daily.  Marland Kitchen aspirin EC 325 MG EC tablet Take 1 tablet (325 mg total) by mouth daily.  Marland Kitchen atorvastatin (LIPITOR) 80 MG tablet Take 1 tablet (80 mg total) by mouth daily at 6 PM.  . digoxin (LANOXIN) 0.125 MG tablet Take 1 tablet (0.125 mg total) by mouth daily.  Marland Kitchen losartan (COZAAR) 25 MG tablet Take 1 tablet (25 mg total) by mouth daily.  . potassium chloride SA (K-DUR) 10 MEQ tablet Take 1 tablet (10 mEq total) by mouth daily.  Marland Kitchen spironolactone (ALDACTONE) 25 MG tablet Take 1 tablet (25 mg total) by mouth  daily.  . [EXPIRED] traMADol (ULTRAM) 50 MG tablet Take 1-2 tablets (50-100 mg total) by mouth every 6 (six) hours as needed for up to 7 days for moderate pain.   No facility-administered encounter medications on file as of 01/10/2019.     Follow-up: Return in about 3 months (around 04/11/2019) for medical conditions.    Tomasita Morrow, RN

## 2019-01-25 ENCOUNTER — Other Ambulatory Visit: Payer: Self-pay | Admitting: Cardiothoracic Surgery

## 2019-01-25 DIAGNOSIS — Z951 Presence of aortocoronary bypass graft: Secondary | ICD-10-CM

## 2019-01-26 ENCOUNTER — Telehealth: Payer: Self-pay

## 2019-01-26 ENCOUNTER — Ambulatory Visit (INDEPENDENT_AMBULATORY_CARE_PROVIDER_SITE_OTHER): Payer: Self-pay | Admitting: Cardiothoracic Surgery

## 2019-01-26 ENCOUNTER — Other Ambulatory Visit: Payer: Self-pay

## 2019-01-26 ENCOUNTER — Other Ambulatory Visit: Payer: Self-pay | Admitting: Cardiothoracic Surgery

## 2019-01-26 ENCOUNTER — Ambulatory Visit
Admission: RE | Admit: 2019-01-26 | Discharge: 2019-01-26 | Disposition: A | Discharge status: Home / Self Care | Payer: Medicaid Other | Source: Ambulatory Visit | Attending: Cardiothoracic Surgery | Admitting: Cardiothoracic Surgery

## 2019-01-26 ENCOUNTER — Encounter: Payer: Self-pay | Admitting: Cardiothoracic Surgery

## 2019-01-26 VITALS — BP 118/76 | HR 76 | Temp 97.7°F | Resp 20 | Ht 72.0 in | Wt 189.0 lb

## 2019-01-26 DIAGNOSIS — Z951 Presence of aortocoronary bypass graft: Secondary | ICD-10-CM

## 2019-01-26 DIAGNOSIS — R911 Solitary pulmonary nodule: Secondary | ICD-10-CM

## 2019-01-26 DIAGNOSIS — I2510 Atherosclerotic heart disease of native coronary artery without angina pectoris: Secondary | ICD-10-CM

## 2019-01-26 MED FILL — LOSARTAN POTASSIUM 25 MG TA: 25 | 30 days supply | Qty: 30 | Fill #0

## 2019-01-26 MED FILL — DIGOXIN 0.125 MG TABLET: 125 | 30 days supply | Qty: 30 | Fill #0

## 2019-01-26 MED FILL — ATORVASTATIN 80 MG TABLET: 80 | 30 days supply | Qty: 30 | Fill #0

## 2019-01-26 MED FILL — POTASSIUM CHLORIDE ER 10 ME: 10 | 30 days supply | Qty: 30 | Fill #0

## 2019-01-26 MED FILL — SPIRONOLACTONE 25 MG TABLET: 25 | 30 days supply | Qty: 30 | Fill #0

## 2019-01-26 NOTE — Progress Notes (Signed)
PCP is Patient, No Pcp Per Referring Provider is Hilty, Nadean Corwin, MD  Chief Complaint  Patient presents with  . Routine Post Op    f/u from surgery with CXR s/p Coronary artery bypass grafting x3    HPI: Patient returns for scheduled office visit almost 6 weeks after urgent CABG for ischemic cardiomyopathy, heart failure, severe three-vessel CAD. Also a 2.5 cm irregular nodule in the left lower lobe was noted on his CT scan.  He is a non-smoker.  The patient had transient postop A. fib and was discharged home on amiodarone sinus rhythm  Patient has done well since discharge-no recurrent symptoms of CHF.  Surgical incisions well-healed. He lost about 15 pounds but is now starting to regain weight.  Appetite improved.  He is walking about a mile a day with his friend.  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  Chest x-ray today is clear.  Sternal wires intact and well aligned.  No nodule is noted the left lower lobe on today's chest x-ray but on preop CT scan the density is retrocardiac.  Is felt to be possibly infectious but he will need a follow-up chest CT scan when he returns for his next visit in 8 weeks.    Past Medical History:  Diagnosis Date  . Back pain     Past Surgical History:  Procedure Laterality Date  . CHOLECYSTECTOMY  01/31/2012   Procedure: LAPAROSCOPIC CHOLECYSTECTOMY;  Surgeon: Gayland Curry, MD,FACS;  Location: [place];  Service: General;  Laterality: N/A;  . CORONARY ARTERY BYPASS GRAFT N/A 12/21/2018   Procedure: CORONARY ARTERY BYPASS GRAFTING (CABG) x 3, USING LEFT INTERNAL MAMMARY ARTERY (LIMA) AND RIGHT GREATER SAPHENOUS VEIN (SVG) HARVESTED ENDOSCOPICALLY;    LIMA-LAD, SVG-RAMUS, SVG-DIAG;  Surgeon: Ivin Poot, MD;  Location: [place];  Service: Open Heart Surgery;  Laterality: N/A;  . RIGHT/LEFT HEART CATH AND CORONARY ANGIOGRAPHY N/A 12/16/2018   Procedure: RIGHT/LEFT HEART CATH AND  CORONARY ANGIOGRAPHY;  Surgeon: Jolaine Artist, MD;  Location: [place] [facility];  Service: Cardiovascular;  Laterality: N/A;  . TEE WITHOUT CARDIOVERSION N/A 12/21/2018   Procedure: TRANSESOPHAGEAL ECHOCARDIOGRAM (TEE);  Surgeon: Prescott Gum, Collier Salina, MD;  Location: [place];  Service: Open Heart Surgery;  Laterality: N/A;    Family History  Problem Relation Age of Onset  . Parkinson's disease Mother     Social History Social History   Tobacco Use  . Smoking status: Never Smoker  . Smokeless tobacco: Never Used  Substance Use Topics  . Alcohol use: Yes    Comment: occ  . Drug use: No    Current Outpatient Medications  Medication Sig Dispense Refill  . amiodarone (PACERONE) 200 MG tablet Take 1 tablet (200 mg total) by mouth 2 (two) times daily. 60 tablet 1  . aspirin EC 325 MG EC tablet Take 1 tablet (325 mg total) by mouth daily.    Marland Kitchen atorvastatin (LIPITOR) 80 MG tablet Take 1 tablet (80 mg total) by mouth daily at 6 PM. 30 tablet 1  . digoxin (LANOXIN) 0.125 MG tablet Take 1 tablet (0.125 mg total) by mouth daily. 30 tablet 1  . losartan (COZAAR) 25 MG tablet Take 1 tablet (25 mg total) by mouth daily. 30 tablet 1  . potassium chloride SA (K-DUR) 10 MEQ tablet Take 1 tablet (10 mEq total) by mouth daily. 30 tablet 1  . spironolactone (ALDACTONE) 25 MG tablet  Take 1 tablet (25 mg total) by mouth daily. 30 tablet 1  . traMADol (ULTRAM) 50 MG tablet Take by mouth every 6 (six) hours as needed.     No current facility-administered medications for this visit.     No Known Allergies  Review of Systems  Not needing pain meds No fever Appetite and strength gradually improving   BP 118/76   Pulse 76   Temp 97.7 F (36.5 C) (Skin)   Resp 20   Ht 6' (1.829 m)   Wt 189 lb (85.7 kg)   SpO2 98% Comment: RA  BMI 25.63 kg/m  Physical Exam      Exam    General- alert and comfortable.  Sternal incision well-healed.    Neck- no JVD, no cervical adenopathy palpable, no carotid  bruit   Lungs- clear without rales, wheezes   Cor- regular rate and rhythm, no murmur , gallop   Abdomen- soft, non-tender   Extremities - warm, non-tender, minimal edema   Neuro- oriented, appropriate, no focal weakness   Diagnostic Tests: Chest x-ray images personally viewed showing stable mediastinal silhouette, no pleural effusion, sternal wires intact.  Impression: Doing very well after multivessel CABG for ischemic cardiomyopathy, ejection fraction 20%.  He has maintained sinus rhythm and the amiodarone will be stopped.  This should improve his appetite and help with weight loss.  He will continue his other medications.  He will be seen by cardiology in the near future.  I have recommended the patient consider phase 2 cardiac rehab at [facility]  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  Continue walking a mile a day.  May lift more than 10 pounds.  Return for follow-up with CT scan of chest in about 8 weeks to follow-up left lower lobe 2.5 cm density noted on preop scan.   Len Childs, MD [facility] [phone]

## 2019-01-26 NOTE — Telephone Encounter (Signed)
Jonathan King contacted the office with questions about his refills on medications.  Patient was seen in the office today and stated he was taking all of his medications.  Patient was advised to contact his Cardiologist office for future refills on medications.  He acknowledged receipt.

## 2019-01-27 NOTE — Progress Notes (Signed)
PCP: Primary HF Cardiologist: Dr Haroldine Laws CT Surgery: Dr Darcey Nora  HPI: 53 year old patient with a history of cholecystectomy in 2013, CAD, S/P CABG, left lung nodule.   Admitted with increase shortness of breath. Echo 12/11/18 LVEF 15-20% with moderate RV dysfunction .Chest CT 8/17  with suggestion of severe CAD with totally occluded LAD and high-grade ramus lesion. Also LLL nodule suggestive of lung CA (versus infection). He underwent CABG x3 on 12/21/18. Post operative course was uneventful. He was discharged to home with [facility] on 12/30/2018.   Had follow up with CT surgery. Plan for CT scan of chest to reassess possible mass in a couple of months. Amio was stopped at that visit.   Today he returns for HF follow up.Overall feeling fine. Denies SOB/PND/Orthopnea. No chest pain. Appetite ok. No fever or chills. Weight at home 184-185  pounds. Walking 15-20 minutes without stopping twice a day. Taking all medications.  Lives with his friend, Audelia Acton.   ECHO 11/2018 EF 15%. RV moderately reduced.   RHC/LHC 12/16/18 RIGHT/LEFT HEART CATH AND CORONARY ANGIOGRAPHY  Conclusion    RPDA lesion is 70% stenosed.  Ost LAD to Prox LAD lesion is 99% stenosed.  Prox LAD to Mid LAD lesion is 100% stenosed.  1st Diag lesion is 99% stenosed.  Ramus lesion is 90% stenosed.  Findings:  Ao = 88/51 (68) LV = 86/17 RA = 3 RV = 46/5 PA = 45/10 (23) PCW = 17 Fick cardiac output/index = 4.2/2.0 PVR = 1.5 WU Ao sat = 98% PA sat = 67%, 68%  Assessment: 1. 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 Well-compensated filling pressures with moderately reduced CO   ROS: All systems negative except as listed in HPI, PMH and Problem List.  SH:  Social History   Socioeconomic History  . Marital status: Divorced    Spouse name: Not on file  . Number of children: Not on file  . Years of education: Not on file  . Highest education level: Not  on file  Occupational History  . Not on file  Social Needs  . Financial resource strain: Not on file  . Food insecurity    Worry: Not on file    Inability: Not on file  . Transportation needs    Medical: Not on file    Non-medical: Not on file  Tobacco Use  . Smoking status: Never Smoker  . Smokeless tobacco: Never Used  Substance and Sexual Activity  . Alcohol use: Yes    Comment: occ  . Drug use: No  . Sexual activity: Not on file  Lifestyle  . Physical activity    Days per week: Not on file    Minutes per session: Not on file  . Stress: Not on file  Relationships  . Social Herbalist on phone: Not on file    Gets together: Not on file    Attends religious service: Not on file    Active member of club or organization: Not on file    Attends meetings of clubs or organizations: Not on file    Relationship status: Not on file  . Intimate partner violence    Fear of current or ex partner: Not on file    Emotionally abused: Not on file    Physically abused: Not on file    Forced sexual activity: Not on file  Other Topics Concern  . Not on file  Social History Narrative  . Not on file    FH:  Family History  Problem Relation Age of Onset  . Parkinson's disease Mother     Past Medical History:  Diagnosis Date  . Back pain     Current Outpatient Medications  Medication Sig Dispense Refill  . aspirin EC 325 MG EC tablet Take 1 tablet (325 mg total) by mouth daily.    Marland Kitchen atorvastatin (LIPITOR) 80 MG tablet Take 1 tablet (80 mg total) by mouth daily at 6 PM. 30 tablet 1  . digoxin (LANOXIN) 0.125 MG tablet Take 1 tablet (0.125 mg total) by mouth daily. 30 tablet 1  . losartan (COZAAR) 25 MG tablet Take 1 tablet (25 mg total) by mouth daily. 30 tablet 1  . potassium chloride SA (K-DUR) 10 MEQ tablet Take 1 tablet (10 mEq total) by mouth daily. 30 tablet 1  . spironolactone (ALDACTONE) 25 MG tablet Take 1 tablet (25 mg total) by mouth daily. 30 tablet 1    No current facility-administered medications for this encounter.     Vitals:   01/28/19 1125  BP: 112/68  Pulse: 76  SpO2: 97%  Weight: 87.1 kg (192 lb)   Wt Readings from Last 3 Encounters:  01/28/19 87.1 kg (192 lb)  01/26/19 85.7 kg (189 lb)  01/10/19 86.6 kg (191 lb)    PHYSICAL EXAM: General:  Well appearing. No resp difficulty HEENT: normal Neck: supple. no JVD. Carotids 2+ bilat; no bruits. No lymphadenopathy or thryomegaly appreciated. Cor: PMI nondisplaced. Regular rate & rhythm. No rubs, gallops or murmurs. Lungs: clear Abdomen: soft, nontender, nondistended. No hepatosplenomegaly. No bruits or masses. Good bowel sounds. Extremities: no cyanosis, clubbing, rash, edema Neuro: alert & orientedx3, cranial nerves grossly intact. moves all 4 extremities w/o difficulty. Affect pleasant  ECG: NSR 73 bpm   ASSESSMENT & PLAN: 1. CAD s/p CABG x3 on 12/21/18 - Continue ASA and statin -No chest pain.   2. Post Operative Atrial Fibrillation:  - In NSR today. Amio stopped at recent CT surgery follow up.  -No AC for now unless AF recurs    3. Chronic Systolic CHF, ICM : - EF 92-01% . Plan to check ECHO in 3 months.  NYHA II. Volume status stable. Continue spiro 25. He does not need lasix.  - Continue losartan 25mg  but switch to qhs. BP too low to switch to Entresto as BP tolerates  - continue digoxin - Add 3.125 mg carvedilol twice a day.   4. High Burden PVCs:  ~ 20% burden initially. Suspect likely contributing to cardiomyopathy. - suppressed on amio - Amio stopped 01/26/19  - No PVCs noted on EKG today.   5.  LLL Nodule: new finding noted on chest CT 8/17. Concern for lung CA vs infection. Followed by Dr Darcey Nora with additional imaging in November.   Follow up 4 weeks with Dr Golden Pop NP-C  11:40 AM

## 2019-01-28 ENCOUNTER — Other Ambulatory Visit: Payer: Self-pay

## 2019-01-28 ENCOUNTER — Ambulatory Visit (HOSPITAL_COMMUNITY)
Admission: RE | Admit: 2019-01-28 | Discharge: 2019-01-28 | Disposition: A | Discharge status: Home / Self Care | Payer: Self-pay | Source: Ambulatory Visit | Attending: Cardiology | Admitting: Cardiology

## 2019-01-28 VITALS — BP 112/68 | HR 76 | Wt 192.0 lb

## 2019-01-28 DIAGNOSIS — R911 Solitary pulmonary nodule: Secondary | ICD-10-CM | POA: Insufficient documentation

## 2019-01-28 DIAGNOSIS — I444 Left anterior fascicular block: Secondary | ICD-10-CM | POA: Insufficient documentation

## 2019-01-28 DIAGNOSIS — Z951 Presence of aortocoronary bypass graft: Secondary | ICD-10-CM | POA: Insufficient documentation

## 2019-01-28 DIAGNOSIS — R9431 Abnormal electrocardiogram [ECG] [EKG]: Secondary | ICD-10-CM | POA: Insufficient documentation

## 2019-01-28 DIAGNOSIS — I493 Ventricular premature depolarization: Secondary | ICD-10-CM | POA: Insufficient documentation

## 2019-01-28 DIAGNOSIS — I4729 Other ventricular tachycardia: Secondary | ICD-10-CM

## 2019-01-28 DIAGNOSIS — I4891 Unspecified atrial fibrillation: Secondary | ICD-10-CM | POA: Insufficient documentation

## 2019-01-28 DIAGNOSIS — Z79899 Other long term (current) drug therapy: Secondary | ICD-10-CM | POA: Insufficient documentation

## 2019-01-28 DIAGNOSIS — Z7982 Long term (current) use of aspirin: Secondary | ICD-10-CM | POA: Insufficient documentation

## 2019-01-28 DIAGNOSIS — I480 Paroxysmal atrial fibrillation: Secondary | ICD-10-CM

## 2019-01-28 DIAGNOSIS — I25810 Atherosclerosis of coronary artery bypass graft(s) without angina pectoris: Secondary | ICD-10-CM | POA: Insufficient documentation

## 2019-01-28 DIAGNOSIS — I5022 Chronic systolic (congestive) heart failure: Secondary | ICD-10-CM | POA: Insufficient documentation

## 2019-01-28 DIAGNOSIS — I472 Ventricular tachycardia: Secondary | ICD-10-CM

## 2019-01-28 DIAGNOSIS — I255 Ischemic cardiomyopathy: Secondary | ICD-10-CM | POA: Insufficient documentation

## 2019-01-28 DIAGNOSIS — Z9049 Acquired absence of other specified parts of digestive tract: Secondary | ICD-10-CM | POA: Insufficient documentation

## 2019-01-28 MED ORDER — CARVEDILOL 3.125 MG PO TABS: 3.1250 mg | ORAL_TABLET | Freq: Two times a day (BID) | ORAL | 3 refills | Status: DC

## 2019-01-28 MED FILL — CARVEDILOL 3.125 MG TABLET: 3.125 | 30 days supply | Qty: 60 | Fill #0

## 2019-01-28 NOTE — Addendum Note (Signed)
Encounter addended by: Marlise Eves, RN on: 01/28/2019 12:00 PM  Actions taken: Pharmacy for encounter modified, Order list changed

## 2019-01-28 NOTE — Patient Instructions (Signed)
START Carvedilol 3.125mg  tab two times daily.  Please follow up with the [facility] in 4 weeks.  At the [facility], you and your health needs are our priority. As part of our continuing mission to provide you with exceptional heart care, we have created designated Provider Care Teams. These Care Teams include your primary Cardiologist (physician) and Advanced Practice Providers (APPs- Physician Assistants and Nurse Practitioners) who all work together to provide you with the care you need, when you need it.   You may see any of the following providers on your designated Care Team at your next follow up: Marland Kitchen Dr Glori Bickers . Dr Loralie Champagne . Darrick Grinder, NP   Please be sure to bring in all your medications bottles to every appointment.

## 2019-02-28 ENCOUNTER — Encounter (HOSPITAL_COMMUNITY): Payer: Self-pay | Admitting: Internal Medicine

## 2019-02-28 ENCOUNTER — Other Ambulatory Visit: Payer: Self-pay

## 2019-02-28 ENCOUNTER — Ambulatory Visit (HOSPITAL_COMMUNITY)
Admission: RE | Admit: 2019-02-28 | Discharge: 2019-02-28 | Disposition: A | Discharge status: Home / Self Care | Payer: Medicaid Other | Source: Ambulatory Visit | Attending: Internal Medicine | Admitting: Internal Medicine

## 2019-02-28 VITALS — BP 110/62 | HR 75 | Wt 192.6 lb

## 2019-02-28 DIAGNOSIS — I4891 Unspecified atrial fibrillation: Secondary | ICD-10-CM | POA: Insufficient documentation

## 2019-02-28 DIAGNOSIS — I5022 Chronic systolic (congestive) heart failure: Secondary | ICD-10-CM

## 2019-02-28 DIAGNOSIS — Z951 Presence of aortocoronary bypass graft: Secondary | ICD-10-CM | POA: Insufficient documentation

## 2019-02-28 DIAGNOSIS — R918 Other nonspecific abnormal finding of lung field: Secondary | ICD-10-CM | POA: Insufficient documentation

## 2019-02-28 DIAGNOSIS — R42 Dizziness and giddiness: Secondary | ICD-10-CM | POA: Insufficient documentation

## 2019-02-28 DIAGNOSIS — I2510 Atherosclerotic heart disease of native coronary artery without angina pectoris: Secondary | ICD-10-CM | POA: Insufficient documentation

## 2019-02-28 DIAGNOSIS — R911 Solitary pulmonary nodule: Secondary | ICD-10-CM

## 2019-02-28 DIAGNOSIS — I255 Ischemic cardiomyopathy: Secondary | ICD-10-CM | POA: Insufficient documentation

## 2019-02-28 DIAGNOSIS — Z79899 Other long term (current) drug therapy: Secondary | ICD-10-CM | POA: Insufficient documentation

## 2019-02-28 DIAGNOSIS — I493 Ventricular premature depolarization: Secondary | ICD-10-CM | POA: Insufficient documentation

## 2019-02-28 DIAGNOSIS — Z7982 Long term (current) use of aspirin: Secondary | ICD-10-CM | POA: Insufficient documentation

## 2019-02-28 LAB — COMPREHENSIVE METABOLIC PANEL
ALT: 35 U/L (ref 0–44)
AST: 24 U/L (ref 15–41)
Albumin: 3.8 g/dL (ref 3.5–5.0)
Alkaline Phosphatase: 78 U/L (ref 38–126)
Anion gap: 9 (ref 5–15)
BUN: 23 mg/dL — ABNORMAL HIGH (ref 6–20)
CO2: 25 mmol/L (ref 22–32)
Calcium: 9.1 mg/dL (ref 8.9–10.3)
Chloride: 101 mmol/L (ref 98–111)
Creatinine, Ser: 1.12 mg/dL (ref 0.61–1.24)
GFR calc Af Amer: >60 mL/min (ref >60)
GFR calc non Af Amer: >60 mL/min (ref >60)
Glucose, Bld: 105 mg/dL — ABNORMAL HIGH (ref 70–99)
Potassium: 4.3 mmol/L (ref 3.5–5.1)
Sodium: 135 mmol/L (ref 135–145)
Total Bilirubin: 0.6 mg/dL (ref 0.3–1.2)
Total Protein: 7.2 g/dL (ref 6.5–8.1)

## 2019-02-28 LAB — BRAIN NATRIURETIC PEPTIDE: B Natriuretic Peptide: 140.7 pg/mL — ABNORMAL HIGH (ref 0.0–100.0)

## 2019-02-28 MED ORDER — AMIODARONE HCL 200 MG PO TABS: 200.0000 mg | ORAL_TABLET | Freq: Every day | ORAL | 3 refills | Status: DC

## 2019-02-28 MED ORDER — LOSARTAN POTASSIUM 25 MG PO TABS: 25.0000 mg | ORAL_TABLET | Freq: Every day | ORAL | 3 refills | Status: DC

## 2019-02-28 MED ORDER — ATORVASTATIN CALCIUM 80 MG PO TABS: 80.0000 mg | ORAL_TABLET | Freq: Every day | ORAL | 3 refills | Status: DC

## 2019-02-28 MED ORDER — SPIRONOLACTONE 25 MG PO TABS: 25.0000 mg | ORAL_TABLET | Freq: Every day | ORAL | 3 refills | Status: DC

## 2019-02-28 MED ORDER — DIGOXIN 125 MCG PO TABS: 0.1250 mg | ORAL_TABLET | Freq: Every day | ORAL | 3 refills | Status: DC

## 2019-02-28 MED ORDER — POTASSIUM CHLORIDE CRYS ER 10 MEQ PO TBCR: 10.0000 meq | EXTENDED_RELEASE_TABLET | Freq: Every day | ORAL | 3 refills | Status: DC

## 2019-02-28 MED ORDER — CARVEDILOL 3.125 MG PO TABS: 3.1250 mg | ORAL_TABLET | Freq: Two times a day (BID) | ORAL | 3 refills | Status: DC

## 2019-02-28 MED ORDER — ASPIRIN EC 81 MG PO TBEC: 81.0000 mg | DELAYED_RELEASE_TABLET | Freq: Every day | ORAL | 3 refills | Status: DC

## 2019-02-28 MED FILL — AMIODARONE HCL 200 MG TAB: 200 | 30 days supply | Qty: 30 | Fill #0

## 2019-02-28 MED FILL — LOSARTAN POTASSIUM 25 MG TA: 25 | 30 days supply | Qty: 30 | Fill #0

## 2019-02-28 MED FILL — ATORVASTATIN 80 MG TABLET: 80 | 30 days supply | Qty: 30 | Fill #0

## 2019-02-28 MED FILL — SPIRONOLACTONE 25 MG TABLET: 25 | 30 days supply | Qty: 30 | Fill #0

## 2019-02-28 MED FILL — POTASSIUM CHLORIDE ER 10 ME: 10 | 30 days supply | Qty: 30 | Fill #0

## 2019-02-28 MED FILL — CARVEDILOL 3.125 MG TABLET: 3.125 | 30 days supply | Qty: 60 | Fill #0

## 2019-02-28 NOTE — Progress Notes (Signed)
ADVANCED HF CLINIC NOTE  Primary HF Cardiologist: Dr Haroldine Laws CT Surgery: Dr Darcey Nora  HPI: 53 year old patient with a history of cholecystectomy in 2013, CAD, S/P CABG, left lung nodule.   Admitted with increase shortness of breath. Echo 12/11/18 LVEF 15-20% with moderate RV dysfunction .Chest CT 8/17  with suggestion of severe CAD with totally occluded LAD and high-grade ramus lesion. Also LLL nodule suggestive of lung CA (versus infection). He underwent CABG x3 on 12/21/18. Post operative course was uneventful. He was discharged to home with [facility] on 12/30/2018.   Had follow up with CT surgery. Plan for CT scan of chest to reassess possible mass in a couple of months. Amio was stopped at that visit.   Today he returns for HF follow up. Overall feeling very good. Weight stable at 185. Denies CP, SOB, orthopnea, edema or PND. Back working at [employer].  Feels lightheaded if he gets up too quickly.    ECHO 11/2018 EF 15%. RV moderately reduced.   RHC/LHC 12/16/18 RIGHT/LEFT HEART CATH AND CORONARY ANGIOGRAPHY  Conclusion    RPDA lesion is 70% stenosed.  Ost LAD to Prox LAD lesion is 99% stenosed.  Prox LAD to Mid LAD lesion is 100% stenosed.  1st Diag lesion is 99% stenosed.  Ramus lesion is 90% stenosed.  Findings:  Ao = 88/51 (68) LV = 86/17 RA = 3 RV = 46/5 PA = 45/10 (23) PCW = 17 Fick cardiac output/index = 4.2/2.0 PVR = 1.5 WU Ao sat = 98% PA sat = 67%, 68%  Assessment: 1. 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 Well-compensated filling pressures with moderately reduced CO   ROS: All systems negative except as listed in HPI, PMH and Problem List.  SH:  Social History   Socioeconomic History  . Marital status: Divorced    Spouse name: Not on file  . Number of children: Not on file  . Years of education: Not on file  . Highest education level: Not on file  Occupational History  . Not on  file  Social Needs  . Financial resource strain: Not on file  . Food insecurity    Worry: Not on file    Inability: Not on file  . Transportation needs    Medical: Not on file    Non-medical: Not on file  Tobacco Use  . Smoking status: Never Smoker  . Smokeless tobacco: Never Used  Substance and Sexual Activity  . Alcohol use: Yes    Comment: occ  . Drug use: No  . Sexual activity: Not on file  Lifestyle  . Physical activity    Days per week: Not on file    Minutes per session: Not on file  . Stress: Not on file  Relationships  . Social Herbalist on phone: Not on file    Gets together: Not on file    Attends religious service: Not on file    Active member of club or organization: Not on file    Attends meetings of clubs or organizations: Not on file    Relationship status: Not on file  . Intimate partner violence    Fear of current or ex partner: Not on file    Emotionally abused: Not on file    Physically abused: Not on file    Forced sexual activity: Not on file  Other Topics Concern  . Not on file  Social History Narrative  . Not on file    FH:  Family History  Problem Relation Age of Onset  . Parkinson's disease Mother     Past Medical History:  Diagnosis Date  . Back pain     Current Outpatient Medications  Medication Sig Dispense Refill  . aspirin EC 325 MG EC tablet Take 1 tablet (325 mg total) by mouth daily.    Marland Kitchen atorvastatin (LIPITOR) 80 MG tablet Take 1 tablet (80 mg total) by mouth daily at 6 PM. 30 tablet 1  . carvedilol (COREG) 3.125 MG tablet Take 1 tablet (3.125 mg total) by mouth 2 (two) times daily. 180 tablet 3  . digoxin (LANOXIN) 0.125 MG tablet Take 1 tablet (0.125 mg total) by mouth daily. 30 tablet 1  . losartan (COZAAR) 25 MG tablet Take 1 tablet (25 mg total) by mouth daily. 30 tablet 1  . potassium chloride SA (K-DUR) 10 MEQ tablet Take 1 tablet (10 mEq total) by mouth daily. 30 tablet 1  . spironolactone (ALDACTONE) 25  MG tablet Take 1 tablet (25 mg total) by mouth daily. 30 tablet 1   No current facility-administered medications for this encounter.     Vitals:   02/28/19 1051  BP: 110/62  Pulse: 75  SpO2: 95%  Weight: 87.4 kg (192 lb 9.6 oz)   Wt Readings from Last 3 Encounters:  02/28/19 87.4 kg (192 lb 9.6 oz)  01/28/19 87.1 kg (192 lb)  01/26/19 85.7 kg (189 lb)    PHYSICAL EXAM: General:  Well appearing. No resp difficulty HEENT: normal Neck: supple. no JVD. Carotids 2+ bilat; no bruits. No lymphadenopathy or thryomegaly appreciated. Cor: PMI nondisplaced. Regular rate & rhythm. No rubs, gallops or murmurs. Lungs: clear Abdomen: soft, nontender, nondistended. No hepatosplenomegaly. No bruits or masses. Good bowel sounds. Extremities: no cyanosis, clubbing, rash, edema Neuro: alert & orientedx3, cranial nerves grossly intact. moves all 4 extremities w/o difficulty. Affect pleasant   ECG: NSR 75bpm with frequent monomorphic PVCs Personally reviewed   ASSESSMENT & PLAN: 1. CAD s/p CABG x3 on 12/21/18 - Continue ASA and statin - Doing very well. No CP. - Decrease ASA to 81mg  daily - Prefers not to go to Cardiac Rehab  2. Post Operative Atrial Fibrillation:  - In NSR today. Amio stopped at recent CT surgery follow up.  -No AC for now unless AF recurs   - Will restart amio for PVC  3. Chronic Systolic CHF, ICM : - EF 16-94% . - NYHA I-II. Volume status stable. Continue spiro 25. He does not need lasix.  - Continue losartan 25mg  qhs. BP too low to switch to [employer] titrate upward with orthostasis - Continue 3.125 mg carvedilol twice a day.  - stop digoxin  - check echo  4. High Burden PVCs:  ~ 20% burden initially. Suspect likely contributing to cardiomyopathy. - suppressed on amio - Amio stopped 01/26/19 . Now PVCs are back - Will restart amio at 200 daily for now. May need EP input  5.  LLL Nodule: new finding noted on chest CT 8/17. Concern for lung CA vs infection.  Followed by Dr Darcey Nora with additional imaging in November. - will order CT scan to follow   Glori Bickers MD 11:12 AM

## 2019-02-28 NOTE — Patient Instructions (Addendum)
Stop Digoxin  Start Amiodarone 200 mg daily  Decrease Aspirin to 81 mg daily  Labs done today  Your physician has requested that you have an echocardiogram. Echocardiography is a painless test that uses sound waves to create images of your heart. It provides your doctor with information about the size and shape of your heart and how well your heart's chambers and valves are working. This procedure takes approximately one hour. There are no restrictions for this procedure.  Your physician recommends that you schedule a follow-up appointment in: 4 months  If you have any questions or concerns before your next appointment please send [place] a message through [place] or call our office at [phone].  At the [facility], you and your health needs are our priority. As part of our continuing mission to provide you with exceptional heart care, we have created designated Provider Care Teams. These Care Teams include your primary Cardiologist (physician) and Advanced Practice Providers (APPs- Physician Assistants and Nurse Practitioners) who all work together to provide you with the care you need, when you need it.   You may see any of the following providers on your designated Care Team at your next follow up: Marland Kitchen Dr Glori Bickers . Dr Loralie Champagne . Darrick Grinder, NP . Lyda Jester, PA   Please be sure to bring in all your medications bottles to every appointment.

## 2019-02-28 NOTE — Addendum Note (Signed)
Encounter addended by: Scarlette Calico, RN on: 02/28/2019 11:40 AM  Actions taken: Visit diagnoses modified, Diagnosis association updated, Order list changed, Medication long-term status modified, Clinical Note Signed, Charge Capture section accepted

## 2019-03-07 ENCOUNTER — Other Ambulatory Visit: Payer: Self-pay

## 2019-03-07 ENCOUNTER — Ambulatory Visit (HOSPITAL_COMMUNITY)
Admission: RE | Admit: 2019-03-07 | Discharge: 2019-03-07 | Disposition: A | Discharge status: Home / Self Care | Payer: Self-pay | Source: Ambulatory Visit | Attending: Internal Medicine | Admitting: Internal Medicine

## 2019-03-07 DIAGNOSIS — I5022 Chronic systolic (congestive) heart failure: Secondary | ICD-10-CM | POA: Insufficient documentation

## 2019-03-07 NOTE — Progress Notes (Signed)
  Echocardiogram 2D Echocardiogram has been performed.  Darlina Sicilian M 03/07/2019, 3:42 PM

## 2019-03-16 ENCOUNTER — Ambulatory Visit
Admission: RE | Admit: 2019-03-16 | Discharge: 2019-03-16 | Disposition: A | Discharge status: Home / Self Care | Payer: Medicaid Other | Source: Ambulatory Visit | Attending: Cardiothoracic Surgery | Admitting: Cardiothoracic Surgery

## 2019-03-16 ENCOUNTER — Encounter: Payer: Self-pay | Admitting: Cardiothoracic Surgery

## 2019-03-16 ENCOUNTER — Ambulatory Visit (INDEPENDENT_AMBULATORY_CARE_PROVIDER_SITE_OTHER): Payer: Self-pay | Admitting: Cardiothoracic Surgery

## 2019-03-16 ENCOUNTER — Other Ambulatory Visit: Payer: Self-pay

## 2019-03-16 VITALS — BP 98/60 | HR 64 | Temp 97.6°F | Resp 16 | Ht 72.0 in | Wt 193.0 lb

## 2019-03-16 DIAGNOSIS — Z951 Presence of aortocoronary bypass graft: Secondary | ICD-10-CM

## 2019-03-16 DIAGNOSIS — I2510 Atherosclerotic heart disease of native coronary artery without angina pectoris: Secondary | ICD-10-CM

## 2019-03-16 DIAGNOSIS — R911 Solitary pulmonary nodule: Secondary | ICD-10-CM

## 2019-03-16 MED ORDER — IOPAMIDOL (ISOVUE-300) INJECTION 61%
75.0000 mL | Freq: Once | INTRAVENOUS | Status: AC | PRN
  Administered 2019-03-16: 11:00:00 75 mL via INTRAVENOUS

## 2019-03-16 NOTE — Progress Notes (Signed)
PCP is Patient, No Pcp Per Referring Provider is Hilty, Nadean Corwin, MD  Chief Complaint  Patient presents with  . Routine Post Op    2 month f/u with CT CHEST to assess LLLobe density    HPI: The patient returns almost 3 months after CABG x3 for ischemic cardiomyopathy.  He is doing well without symptoms of heart failure or angina.  A recent postop echo shows improvement in LV function, EF now up to 25%.  Patient had a CT scan of the chest today to evaluate a 2 cm left lower lobe density present on his preoperative x-ray.  That has now resolved and there is no evidence of neoplasm or abnormal adenopathy in his chest.  Patient is trying to walk 20 to 30 minutes daily.  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  He is compliant with his medication.  He is followed at the advanced heart failure clinic.   Past Medical History:  Diagnosis Date  . Back pain     Past Surgical History:  Procedure Laterality Date  . CHOLECYSTECTOMY  01/31/2012   Procedure: LAPAROSCOPIC CHOLECYSTECTOMY;  Surgeon: Gayland Curry, MD,FACS;  Location: [facility];  Service: General;  Laterality: N/A;  . CORONARY ARTERY BYPASS GRAFT N/A 12/21/2018   Procedure: CORONARY ARTERY BYPASS GRAFTING (CABG) x 3, USING LEFT INTERNAL MAMMARY ARTERY (LIMA) AND RIGHT GREATER SAPHENOUS VEIN (SVG) HARVESTED ENDOSCOPICALLY;    LIMA-LAD, SVG-RAMUS, SVG-DIAG;  Surgeon: Ivin Poot, MD;  Location: [facility];  Service: Open Heart Surgery;  Laterality: N/A;  . RIGHT/LEFT HEART CATH AND CORONARY ANGIOGRAPHY N/A 12/16/2018   Procedure: RIGHT/LEFT HEART CATH AND CORONARY ANGIOGRAPHY;  Surgeon: Jolaine Artist, MD;  Location: [facility];  Service: Cardiovascular;  Laterality: N/A;  . TEE WITHOUT CARDIOVERSION N/A 12/21/2018   Procedure: TRANSESOPHAGEAL ECHOCARDIOGRAM (TEE);  Surgeon: Prescott Gum, Collier Salina, MD;  Location: [facility];  Service: Open Heart Surgery;  Laterality: N/A;    Family History  Problem Relation Age  of Onset  . Parkinson's disease Mother     Social History Social History   Tobacco Use  . Smoking status: Never Smoker  . Smokeless tobacco: Never Used  Substance Use Topics  . Alcohol use: Yes    Comment: occ  . Drug use: No    Current Outpatient Medications  Medication Sig Dispense Refill  . amiodarone (PACERONE) 200 MG tablet Take 1 tablet (200 mg total) by mouth daily. 30 tablet 3  . aspirin EC 81 MG tablet Take 1 tablet (81 mg total) by mouth daily. 30 tablet 3  . atorvastatin (LIPITOR) 80 MG tablet Take 1 tablet (80 mg total) by mouth daily at 6 PM. 30 tablet 3  . carvedilol (COREG) 3.125 MG tablet Take 1 tablet (3.125 mg total) by mouth 2 (two) times daily. 60 tablet 3  . losartan (COZAAR) 25 MG tablet Take 1 tablet (25 mg total) by mouth daily. 30 tablet 3  . potassium chloride (KLOR-CON) 10 MEQ tablet Take 1 tablet (10 mEq total) by mouth daily. 30 tablet 3  . spironolactone (ALDACTONE) 25 MG tablet Take 1 tablet (25 mg total) by mouth daily. 30 tablet 3   No current facility-administered medications for this visit.     No Known Allergies  Review of Systems   Mild chest wall discomfort-soreness No edema No orthopnea No cough or fever  BP (!) 98/50 (BP Location: Right Arm, Patient Position: Sitting, Cuff Size: Normal)   Pulse 64  Temp 97.6 F (36.4 C)   Resp 16   Ht 6' (1.829 m)   Wt 193 lb (87.5 kg)   SpO2 94% Comment: RA  BMI 26.18 kg/m  Physical Exam      Exam    General- alert and comfortable    Neck- no JVD, no cervical adenopathy palpable, no carotid bruit   Lungs- clear without rales, wheezes   Cor- regular rate and rhythm, no murmur , gallop   Abdomen- soft, non-tender   Extremities - warm, non-tender, minimal edema   Neuro- oriented, appropriate, no focal weakness   Diagnostic Tests:  CT scan images personally reviewed today showing resolution of the left lower lobe density.  No evidence of neoplasm. Impression: Excellent recovery now  almost 2 months after surgery. He needs to follow heart healthy lifestyle with heart healthy diet and 30 minutes of aerobic activity daily and continue his current medications as recommended by the advanced heart failure clinic.  Plan: All restrictions lifted after December 1.  Return as needed.   Len Childs, MD [facility] [phone]

## 2019-03-29 MED FILL — SPIRONOLACTONE 25 MG TABLET: 25 | 30 days supply | Qty: 30 | Fill #1

## 2019-03-29 MED FILL — POTASSIUM CHLORIDE ER 10 ME: 10 | 30 days supply | Qty: 30 | Fill #1

## 2019-03-29 MED FILL — AMIODARONE HCL 200 MG TAB: 200 | 30 days supply | Qty: 30 | Fill #1

## 2019-03-29 MED FILL — LOSARTAN POTASSIUM 25 MG TA: 25 | 30 days supply | Qty: 30 | Fill #1

## 2019-03-29 MED FILL — CARVEDILOL 3.125 MG TABLET: 3.125 | 30 days supply | Qty: 60 | Fill #1

## 2019-03-29 MED FILL — ATORVASTATIN 80 MG TABLET: 80 | 30 days supply | Qty: 30 | Fill #1

## 2019-04-12 ENCOUNTER — Ambulatory Visit: Payer: Self-pay | Attending: Family Medicine | Admitting: Family Medicine

## 2019-04-12 ENCOUNTER — Other Ambulatory Visit: Payer: Self-pay

## 2019-04-12 ENCOUNTER — Encounter: Payer: Self-pay | Admitting: Family Medicine

## 2019-04-12 VITALS — BP 95/64 | HR 68 | Temp 98.4°F | Ht 72.0 in | Wt 193.0 lb

## 2019-04-12 DIAGNOSIS — Z951 Presence of aortocoronary bypass graft: Secondary | ICD-10-CM

## 2019-04-12 DIAGNOSIS — I5042 Chronic combined systolic (congestive) and diastolic (congestive) heart failure: Secondary | ICD-10-CM

## 2019-04-12 DIAGNOSIS — Z1211 Encounter for screening for malignant neoplasm of colon: Secondary | ICD-10-CM

## 2019-04-12 NOTE — Patient Instructions (Signed)
Heart Failure, Self Care °Heart failure is a serious condition. This sheet explains things you need to do to take care of yourself at home. To help you stay as healthy as possible, you may be asked to change your diet, take certain medicines, and make other changes in your life. Your doctor may also give you more specific instructions. If you have problems or questions, call your doctor. °What are the risks? °Having heart failure makes it more likely for you to have some problems. These problems can get worse if you do not take good care of yourself. Problems may include: °· Blood clotting problems. This may cause a stroke. °· Damage to the kidneys, liver, or lungs. °· Abnormal heart rhythms. °Supplies needed: °· Scale for weighing yourself. °· Blood pressure monitor. °· Notebook. °· Medicines. °How to care for yourself when you have heart failure °Medicines °Take over-the-counter and prescription medicines only as told by your doctor. Take your medicines every day. °· Do not stop taking your medicine unless your doctor tells you to do so. °· Do not skip any medicines. °· Get your prescriptions refilled before you run out of medicine. This is important. °Eating and drinking ° °· Eat heart-healthy foods. Talk with a diet specialist (dietitian) to create an eating plan. °· Choose foods that: °? Have no trans fat. °? Are low in saturated fat and cholesterol. °· Choose healthy foods, such as: °? Fresh or frozen fruits and vegetables. °? Fish. °? Low-fat (lean) meats. °? Legumes, such as beans, peas, and lentils. °? Fat-free or low-fat dairy products. °? Whole-grain foods. °? High-fiber foods. °· Limit salt (sodium) if told by your doctor. Ask your diet specialist to tell you which seasonings are healthy for your heart. °· Cook in healthy ways instead of frying. Healthy ways of cooking include roasting, grilling, broiling, baking, poaching, steaming, and stir-frying. °· Limit how much fluid you drink, if told by your  doctor. °Alcohol use °· Do not drink alcohol if: °? Your doctor tells you not to drink. °? Your heart was damaged by alcohol, or you have very bad heart failure. °? You are pregnant, may be pregnant, or are planning to become pregnant. °· If you drink alcohol: °? Limit how much you use to: °§ 0-1 drink a day for women. °§ 0-2 drinks a day for men. °? Be aware of how much alcohol is in your drink. In the [place] one drink equals one 12 oz bottle of beer (355 mL), one 5 oz glass of wine (148 mL), or one 1½ oz glass of hard liquor (44 mL). °Lifestyle ° °· Do not use any products that contain nicotine or tobacco, such as cigarettes, e-cigarettes, and chewing tobacco. If you need help quitting, ask your doctor. °? Do not use nicotine gum or patches before talking to your doctor. °· Do not use illegal drugs. °· Lose weight if told by your doctor. °· Do physical activity if told by your doctor. Talk to your doctor before you begin an exercise if: °? You are an older adult. °? You have very bad heart failure. °· Learn to manage stress. If you need help, ask your doctor. °· Get rehab (rehabilitation) to help you stay independent and to help with your quality of life. °· Plan time to rest when you get tired. °Check weight and blood pressure ° °· Weigh yourself every day. This will help you to know if fluid is building up in your body. °? Weigh yourself every morning   after you pee (urinate) and before you eat breakfast. °? Wear the same amount of clothing each time. °? Write down your daily weight. Give your record to your doctor. °· Check and write down your blood pressure as told by your doctor. °· Check your pulse as told by your doctor. °Dealing with very hot and very cold weather °· If it is very hot: °? Avoid activities that take a lot of energy. °? Use air conditioning or fans, or find a cooler place. °? Avoid caffeine and alcohol. °? Wear clothing that is loose-fitting, lightweight, and light-colored. °· If it is very  cold: °? Avoid activities that take a lot of energy. °? Layer your clothes. °? Wear mittens or gloves, a hat, and a scarf when you go outside. °? Avoid alcohol. °Follow these instructions at home: °· Stay up to date with shots (vaccines). Get pneumococcal and flu (influenza) shots. °· Keep all follow-up visits as told by your doctor. This is important. °Contact a doctor if: °· You gain weight quickly. °· You have increasing shortness of breath. °· You cannot do your normal activities. °· You get tired easily. °· You cough a lot. °· You don't feel like eating or feel like you may vomit (nauseous). °· You become puffy (swell) in your hands, feet, ankles, or belly (abdomen). °· You cannot sleep well because it is hard to breathe. °· You feel like your heart is beating fast (palpitations). °· You get dizzy when you stand up. °Get help right away if: °· You have trouble breathing. °· You or someone else notices a change in your behavior, such as having trouble staying awake. °· You have chest pain or discomfort. °· You pass out (faint). °These symptoms may be an emergency. Do not wait to see if the symptoms will go away. Get medical help right away. Call your local emergency services (911 in the [place] Do not drive yourself to the hospital. °Summary °· Heart failure is a serious condition. To care for yourself, you may have to change your diet, take medicines, and make other lifestyle changes. °· Take your medicines every day. Do not stop taking them unless your doctor tells you to do so. °· Eat heart-healthy foods, such as fresh or frozen fruits and vegetables, fish, lean meats, legumes, fat-free or low-fat dairy products, and whole-grain or high-fiber foods. °· Ask your doctor if you can drink alcohol. You may have to stop alcohol use if you have very bad heart failure. °· Contact your doctor if you gain weight quickly or feel that your heart is beating too fast. Get help right away if you pass out, or have chest pain  or trouble breathing. °This information is not intended to replace advice given to you by your health care provider. Make sure you discuss any questions you have with your health care provider. °Document Released: 07/28/2018 Document Revised: 07/27/2018 Document Reviewed: 07/28/2018 °Elsevier Patient Education © 2020 [employer] ° °

## 2019-04-12 NOTE — Progress Notes (Signed)
Subjective:  Patient ID: Jonathan King, male    DOB: 10-01-1965  Age: 53 y.o. MRN: [id number]  CC: Congestive Heart Failure   HPI Jonathan King is a 53 year old male with hospitalization for three-vessel CAD status post CABG, CHF EF 20-25% from 02/2019, NYHA II seen today for a follow up visit.  He did have a lung nodule on chest CT in 11/2018 and repeat performed in 02/2019 revealed resolution. Today he states he has no pedal edema, no chest pain or dyspnea and his weight has been stable. He endorses compliance with his medications.  Last visit to the CHF clinic was on 02/28/2019. Amiodarone started for PVC Seen by Cardiac surgeon last month and all restrictions lifted with recommendation to return as needed. He has no acute concerns today.  Past Medical History:  Diagnosis Date  . Back pain     Past Surgical History:  Procedure Laterality Date  . CHOLECYSTECTOMY  01/31/2012   Procedure: LAPAROSCOPIC CHOLECYSTECTOMY;  Surgeon: Gayland Curry, MD,FACS;  Location: [facility];  Service: General;  Laterality: N/A;  . CORONARY ARTERY BYPASS GRAFT N/A 12/21/2018   Procedure: CORONARY ARTERY BYPASS GRAFTING (CABG) x 3, USING LEFT INTERNAL MAMMARY ARTERY (LIMA) AND RIGHT GREATER SAPHENOUS VEIN (SVG) HARVESTED ENDOSCOPICALLY;    LIMA-LAD, SVG-RAMUS, SVG-DIAG;  Surgeon: Ivin Poot, MD;  Location: [facility];  Service: Open Heart Surgery;  Laterality: N/A;  . RIGHT/LEFT HEART CATH AND CORONARY ANGIOGRAPHY N/A 12/16/2018   Procedure: RIGHT/LEFT HEART CATH AND CORONARY ANGIOGRAPHY;  Surgeon: Jolaine Artist, MD;  Location: [facility];  Service: Cardiovascular;  Laterality: N/A;  . TEE WITHOUT CARDIOVERSION N/A 12/21/2018   Procedure: TRANSESOPHAGEAL ECHOCARDIOGRAM (TEE);  Surgeon: Prescott Gum, Collier Salina, MD;  Location: [facility];  Service: Open Heart Surgery;  Laterality: N/A;    Family History  Problem Relation Age of Onset  . Parkinson's disease Mother     No Known Allergies  Outpatient  Medications Prior to Visit  Medication Sig Dispense Refill  . amiodarone (PACERONE) 200 MG tablet Take 1 tablet (200 mg total) by mouth daily. 30 tablet 3  . aspirin EC 81 MG tablet Take 1 tablet (81 mg total) by mouth daily. 30 tablet 3  . atorvastatin (LIPITOR) 80 MG tablet Take 1 tablet (80 mg total) by mouth daily at 6 PM. 30 tablet 3  . carvedilol (COREG) 3.125 MG tablet Take 1 tablet (3.125 mg total) by mouth 2 (two) times daily. 60 tablet 3  . losartan (COZAAR) 25 MG tablet Take 1 tablet (25 mg total) by mouth daily. 30 tablet 3  . potassium chloride (KLOR-CON) 10 MEQ tablet Take 1 tablet (10 mEq total) by mouth daily. 30 tablet 3  . spironolactone (ALDACTONE) 25 MG tablet Take 1 tablet (25 mg total) by mouth daily. 30 tablet 3   No facility-administered medications prior to visit.     ROS Review of Systems  Constitutional: Negative for activity change and appetite change.  HENT: Negative for sinus pressure and sore throat.   Eyes: Negative for visual disturbance.  Respiratory: Negative for cough, chest tightness and shortness of breath.   Cardiovascular: Negative for chest pain and leg swelling.  Gastrointestinal: Negative for abdominal distention, abdominal pain, constipation and diarrhea.  Endocrine: Negative.   Genitourinary: Negative for dysuria.  Musculoskeletal: Negative for joint swelling and myalgias.  Skin: Negative for rash.  Allergic/Immunologic: Negative.   Neurological: Negative for weakness, light-headedness and numbness.  Psychiatric/Behavioral: Negative for dysphoric mood and suicidal ideas.  Objective:  BP 95/64   Pulse 68   Temp 98.4 F (36.9 C) (Oral)   Ht 6' (1.829 m)   Wt 193 lb (87.5 kg)   SpO2 96%   BMI 26.18 kg/m   BP/Weight 04/12/2019 03/16/2019 70/12/6281  Systolic BP 95 98 662  Diastolic BP 64 60 62  Wt. (Lbs) 193 193 192.6  BMI 26.18 26.18 26.12      Physical Exam Constitutional:      Appearance: He is well-developed.  Neck:       Vascular: No JVD.  Cardiovascular:     Rate and Rhythm: Normal rate.     Heart sounds: Normal heart sounds. No murmur.  Pulmonary:     Effort: Pulmonary effort is normal.     Breath sounds: Normal breath sounds. No wheezing or rales.  Chest:     Chest wall: No tenderness.  Abdominal:     General: Bowel sounds are normal. There is no distension.     Palpations: Abdomen is soft. There is no mass.     Tenderness: There is no abdominal tenderness.  Musculoskeletal:        General: Normal range of motion.     Right lower leg: No edema.     Left lower leg: No edema.  Neurological:     Mental Status: He is alert and oriented to person, place, and time.  Psychiatric:        Mood and Affect: Mood normal.     CMP Latest Ref Rng & Units 02/28/2019 01/05/2019 12/30/2018  Glucose 70 - 99 mg/dL 105(H) 139(H) 99  BUN 6 - 20 mg/dL 23(H) 18 13  Creatinine 0.61 - 1.24 mg/dL 1.12 1.18 0.91  Sodium 135 - 145 mmol/L 135 134(L) 133(L)  Potassium 3.5 - 5.1 mmol/L 4.3 4.4 4.1  Chloride 98 - 111 mmol/L 101 100 98  CO2 22 - 32 mmol/L 25 22 25   Calcium 8.9 - 10.3 mg/dL 9.1 9.3 8.8(L)  Total Protein 6.5 - 8.1 g/dL 7.2 - -  Total Bilirubin 0.3 - 1.2 mg/dL 0.6 - -  Alkaline Phos 38 - 126 U/L 78 - -  AST 15 - 41 U/L 24 - -  ALT 0 - 44 U/L 35 - -    Lipid Panel     Component Value Date/Time   CHOL 48 12/23/2018 0413   TRIG 53 12/23/2018 0413   HDL 25 (L) 12/23/2018 0413   CHOLHDL 1.9 12/23/2018 0413   VLDL 11 12/23/2018 0413   LDLCALC 12 12/23/2018 0413    CBC    Component Value Date/Time   WBC 13.7 (H) 01/05/2019 1047   RBC 4.53 01/05/2019 1047   HGB 13.5 01/05/2019 1047   HCT 42.6 01/05/2019 1047   PLT 437 (H) 01/05/2019 1047   MCV 94.0 01/05/2019 1047   MCH 29.8 01/05/2019 1047   MCHC 31.7 01/05/2019 1047   RDW 13.8 01/05/2019 1047   LYMPHSABS 2.3 12/19/2018 0555   MONOABS 0.8 12/19/2018 0555   EOSABS 0.3 12/19/2018 0555   BASOSABS 0.1 12/19/2018 0555    Lab Results   Component Value Date   HGBA1C 6.1 (H) 12/21/2018    Assessment & Plan:   1. Chronic combined systolic and diastolic congestive heart failure (HCC) FF 20-25% from echo 02/2019 Euvolemic Continue beta blocker, spironolactone Continue heart health diet, fluid restriction Followed by CHF clinic  2. CAD S/P CABG x 3 Stable - asymptomatic Seen by Cardiac surgeon last month and to return for f/u prn  3. Screening for colon cancer He is not open to colonoscopy - Fecal occult blood, imunochemical([facility])   Return in about 6 months (around 10/11/2019) for Chronic medical conditions.     Charlott Rakes, MD, FAAFP. [facility] [place], [place]   04/12/2019, 3:34 PM

## 2019-04-26 MED FILL — AMIODARONE HCL 200 MG TAB: 200 | 30 days supply | Qty: 30 | Fill #2

## 2019-04-26 MED FILL — LOSARTAN POTASSIUM 25 MG TA: 25 | 30 days supply | Qty: 30 | Fill #2

## 2019-04-26 MED FILL — POTASSIUM CHLORIDE ER 10 ME: 10 | 30 days supply | Qty: 30 | Fill #2

## 2019-04-26 MED FILL — CARVEDILOL 3.125 MG TABLET: 3.125 | 30 days supply | Qty: 60 | Fill #2

## 2019-04-26 MED FILL — SPIRONOLACTONE 25 MG TABLET: 25 | 30 days supply | Qty: 30 | Fill #2

## 2019-04-26 MED FILL — ATORVASTATIN 80 MG TABLET: 80 | 30 days supply | Qty: 30 | Fill #2

## 2019-05-24 MED FILL — SPIRONOLACTONE 25 MG TABLET: 25 | 30 days supply | Qty: 30 | Fill #3

## 2019-05-24 MED FILL — AMIODARONE HCL 200 MG TAB: 200 | 30 days supply | Qty: 30 | Fill #3

## 2019-05-24 MED FILL — LOSARTAN POTASSIUM 25 MG TA: 25 | 30 days supply | Qty: 30 | Fill #3

## 2019-05-24 MED FILL — CARVEDILOL 3.125 MG TABLET: 3.125 | 30 days supply | Qty: 60 | Fill #3

## 2019-05-24 MED FILL — POTASSIUM CHLORIDE ER 10 ME: 10 | 30 days supply | Qty: 30 | Fill #3

## 2019-05-24 MED FILL — ATORVASTATIN 80 MG TABLET: 80 | 30 days supply | Qty: 30 | Fill #3

## 2019-05-31 ENCOUNTER — Telehealth (HOSPITAL_COMMUNITY): Payer: Self-pay

## 2019-05-31 NOTE — Telephone Encounter (Signed)

## 2019-06-01 ENCOUNTER — Ambulatory Visit (HOSPITAL_COMMUNITY)
Admission: RE | Admit: 2019-06-01 | Discharge: 2019-06-01 | Disposition: A | Discharge status: Home / Self Care | Payer: Self-pay | Source: Ambulatory Visit | Attending: Internal Medicine | Admitting: Internal Medicine

## 2019-06-01 ENCOUNTER — Encounter (HOSPITAL_COMMUNITY): Payer: Self-pay | Admitting: Internal Medicine

## 2019-06-01 ENCOUNTER — Other Ambulatory Visit: Payer: Self-pay

## 2019-06-01 VITALS — BP 108/60 | HR 70 | Wt 196.4 lb

## 2019-06-01 DIAGNOSIS — I4891 Unspecified atrial fibrillation: Secondary | ICD-10-CM | POA: Insufficient documentation

## 2019-06-01 DIAGNOSIS — Z951 Presence of aortocoronary bypass graft: Secondary | ICD-10-CM | POA: Insufficient documentation

## 2019-06-01 DIAGNOSIS — I5022 Chronic systolic (congestive) heart failure: Secondary | ICD-10-CM

## 2019-06-01 DIAGNOSIS — I2582 Chronic total occlusion of coronary artery: Secondary | ICD-10-CM | POA: Insufficient documentation

## 2019-06-01 DIAGNOSIS — Z7982 Long term (current) use of aspirin: Secondary | ICD-10-CM | POA: Insufficient documentation

## 2019-06-01 DIAGNOSIS — Z79899 Other long term (current) drug therapy: Secondary | ICD-10-CM | POA: Insufficient documentation

## 2019-06-01 DIAGNOSIS — R42 Dizziness and giddiness: Secondary | ICD-10-CM | POA: Insufficient documentation

## 2019-06-01 DIAGNOSIS — I493 Ventricular premature depolarization: Secondary | ICD-10-CM

## 2019-06-01 DIAGNOSIS — I2510 Atherosclerotic heart disease of native coronary artery without angina pectoris: Secondary | ICD-10-CM

## 2019-06-01 DIAGNOSIS — I255 Ischemic cardiomyopathy: Secondary | ICD-10-CM | POA: Insufficient documentation

## 2019-06-01 DIAGNOSIS — R911 Solitary pulmonary nodule: Secondary | ICD-10-CM | POA: Insufficient documentation

## 2019-06-01 LAB — TSH: TSH: 14.634 u[IU]/mL — ABNORMAL HIGH (ref 0.350–4.500)

## 2019-06-01 LAB — LIPID PANEL
Cholesterol: 93 mg/dL (ref 0–200)
HDL: 36 mg/dL — ABNORMAL LOW (ref >40)
LDL Cholesterol: 34 mg/dL (ref 0–99)
Total CHOL/HDL Ratio: 2.6 RATIO
Triglycerides: 113 mg/dL (ref <150)
VLDL: 23 mg/dL (ref 0–40)

## 2019-06-01 LAB — COMPREHENSIVE METABOLIC PANEL
ALT: 31 U/L (ref 0–44)
AST: 23 U/L (ref 15–41)
Albumin: 3.8 g/dL (ref 3.5–5.0)
Alkaline Phosphatase: 81 U/L (ref 38–126)
Anion gap: 9 (ref 5–15)
BUN: 22 mg/dL — ABNORMAL HIGH (ref 6–20)
CO2: 24 mmol/L (ref 22–32)
Calcium: 9.5 mg/dL (ref 8.9–10.3)
Chloride: 105 mmol/L (ref 98–111)
Creatinine, Ser: 1.04 mg/dL (ref 0.61–1.24)
GFR calc Af Amer: >60 mL/min (ref >60)
GFR calc non Af Amer: >60 mL/min (ref >60)
Glucose, Bld: 106 mg/dL — ABNORMAL HIGH (ref 70–99)
Potassium: 4.1 mmol/L (ref 3.5–5.1)
Sodium: 138 mmol/L (ref 135–145)
Total Bilirubin: 0.7 mg/dL (ref 0.3–1.2)
Total Protein: 7.8 g/dL (ref 6.5–8.1)

## 2019-06-01 LAB — T4, FREE: Free T4: 0.57 ng/dL — ABNORMAL LOW (ref 0.61–1.12)

## 2019-06-01 NOTE — Progress Notes (Signed)
Zio patch placed onto patient.  All instructions and information reviewed with patient, they verbalize understanding with no questions. 

## 2019-06-01 NOTE — Progress Notes (Addendum)
ADVANCED HF CLINIC NOTE  Primary HF Cardiologist: Dr Haroldine Laws CT Surgery: Dr Darcey Nora  HPI:  Jonathan King is a 54 year old patient with a history of cholecystectomy in 2013, CAD s/p CABG 3/55, systolic HF due to iCM and left lung nodule.   Admitted 8/20 with acute HF. Echo 12/11/18 LVEF 15-20% with moderate RV dysfunction .Cath with severe CAD with totally occluded LAD and high-grade ramus lesion. Also LLL nodule suggestive of lung CA (versus infection). He underwent CABG x3 on 12/21/18.  CT chest 11/20 resolution of infiltrate/nodule  Today he returns for HF follow up. At last visit restarted on amio due to frequent PVCs. Feels good. Denies SOB. Initially said no to CP but then his roommate chimed in and said he does have little pain day to day.  Complains of dizziness. Worse with standing. Has not taken BP at home. No palpitations.    ECHO 11/2018 EF 15%. RV moderately reduced.   RHC/LHC 12/16/18 RIGHT/LEFT HEART CATH AND CORONARY ANGIOGRAPHY  Conclusion    RPDA lesion is 70% stenosed.  Ost LAD to Prox LAD lesion is 99% stenosed.  Prox LAD to Mid LAD lesion is 100% stenosed.  1st Diag lesion is 99% stenosed.  Ramus lesion is 90% stenosed.  Findings:  Ao = 88/51 (68) LV = 86/17 RA = 3 RV = 46/5 PA = 45/10 (23) PCW = 17 Fick cardiac output/index = 4.2/2.0 PVR = 1.5 WU Ao sat = 98% PA sat = 67%, 68%  Assessment: 1. 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 Well-compensated filling pressures with moderately reduced CO   ROS: All systems negative except as listed in HPI, PMH and Problem List.  SH:  Social History   Socioeconomic History  . Marital status: Divorced    Spouse name: Not on file  . Number of children: Not on file  . Years of education: Not on file  . Highest education level: Not on file  Occupational History  . Not on file  Tobacco Use  . Smoking status: Never Smoker  . Smokeless  tobacco: Never Used  Substance and Sexual Activity  . Alcohol use: Yes    Comment: occ  . Drug use: No  . Sexual activity: Not on file  Other Topics Concern  . Not on file  Social History Narrative  . Not on file   Social Determinants of Health   Financial Resource Strain:   . Difficulty of Paying Living Expenses: Not on file  Food Insecurity:   . Worried About Charity fundraiser in the Last Year: Not on file  . Ran Out of Food in the Last Year: Not on file  Transportation Needs:   . Lack of Transportation (Medical): Not on file  . Lack of Transportation (Non-Medical): Not on file  Physical Activity:   . Days of Exercise per Week: Not on file  . Minutes of Exercise per Session: Not on file  Stress:   . Feeling of Stress : Not on file  Social Connections:   . Frequency of Communication with Friends and Family: Not on file  . Frequency of Social Gatherings with Friends and Family: Not on file  . Attends Religious Services: Not on file  . Active Member of Clubs or Organizations: Not on file  . Attends Archivist Meetings: Not on file  . Marital Status: Not on file  Intimate Partner Violence:   . Fear of  Current or Ex-Partner: Not on file  . Emotionally Abused: Not on file  . Physically Abused: Not on file  . Sexually Abused: Not on file    FH:  Family History  Problem Relation Age of Onset  . Parkinson's disease Mother     Past Medical History:  Diagnosis Date  . Back pain     Current Outpatient Medications  Medication Sig Dispense Refill  . amiodarone (PACERONE) 200 MG tablet Take 1 tablet (200 mg total) by mouth daily. 30 tablet 3  . aspirin EC 81 MG tablet Take 1 tablet (81 mg total) by mouth daily. 30 tablet 3  . atorvastatin (LIPITOR) 80 MG tablet Take 1 tablet (80 mg total) by mouth daily at 6 PM. 30 tablet 3  . carvedilol (COREG) 3.125 MG tablet Take 1 tablet (3.125 mg total) by mouth 2 (two) times daily. 60 tablet 3  . losartan (COZAAR) 25 MG  tablet Take 1 tablet (25 mg total) by mouth daily. 30 tablet 3  . potassium chloride (KLOR-CON) 10 MEQ tablet Take 1 tablet (10 mEq total) by mouth daily. 30 tablet 3  . spironolactone (ALDACTONE) 25 MG tablet Take 1 tablet (25 mg total) by mouth daily. 30 tablet 3   No current facility-administered medications for this encounter.    Vitals:   06/01/19 1356  BP: 108/60  Pulse: 70  SpO2: 97%  Weight: 89.1 kg (196 lb 6.4 oz)   Wt Readings from Last 3 Encounters:  06/01/19 89.1 kg (196 lb 6.4 oz)  04/12/19 87.5 kg (193 lb)  03/16/19 87.5 kg (193 lb)   BP sitting 104/64  BP standing 114/70   PHYSICAL EXAM: General:  Well appearing. No resp difficulty HEENT: normal Neck: supple. no JVD. Carotids 2+ bilat; no bruits. No lymphadenopathy or thryomegaly appreciated. Cor: PMI nondisplaced. Regular rate & rhythm. No rubs, gallops or murmurs. Lungs: clear Abdomen: soft, nontender, nondistended. No hepatosplenomegaly. No bruits or masses. Good bowel sounds. Extremities: no cyanosis, clubbing, rash, edema Neuro: alert & orientedx3, cranial nerves grossly intact. moves all 4 extremities w/o difficulty. Affect pleasant  ECG: NSR 67 anterior Qs Personally reviewed   ASSESSMENT & PLAN: 1. CAD s/p CABG x3 on 12/21/18 - Continue ASA and statin - Doing very well. No s/s of anigna - Continue ASA, statin and b-blocker  2. Post Operative Atrial Fibrillation:  - In NSR today - On amio for PVCs  3. Chronic Systolic CHF, ICM : - Echo 8/20 EF 20-25% . - Echo 11/20 EF 20-25% - NYHA I-II. Volume status stable. Continue spiro 25. He does not need lasix.  - Continue losartan 25mg  qhs. BP too low to switch to Entresto or titrate upward with dizziness  - Continue 3.125 mg bid - Consider SGLT2i though he has been reluctant to add new meds - Labs today  - Will repeat echo. If EF not improving with PVC suppression will need to consider ICD. We discussed today and he was not very interested in ICD.    4. High Burden PVCs:  ~ 20% burden initially. Suspect likely contributing to cardiomyopathy. - suppressed on amio - Amio stopped 01/26/19 . PVCs restarted 11/20  - Continue amio  - No PVCs on ECG today. - Will get Zio Monitor to re-quantify    Glori Bickers MD 2:13 PM

## 2019-06-01 NOTE — Patient Instructions (Addendum)
Your provider has recommended that  you wear a Zio Patch for 3 days.  This monitor will record your heart rhythm for our review.  IF you have any symptoms while wearing the monitor please press the button.  If you have any issues with the patch or you notice a red or orange light on it please call the company at [phone].  Once you remove the patch please mail it back to the company as soon as possible so we can get the results.   Labs today We will only contact you if something comes back abnormal or we need to make some changes. Otherwise no news is good news!   Your physician has requested that you have an echocardiogram. Echocardiography is a painless test that uses sound waves to create images of your heart. It provides your doctor with information about the size and shape of your heart and how well your heart's chambers and valves are working. This procedure takes approximately one hour. There are no restrictions for this procedure.   Your physician recommends that you schedule a follow-up appointment in: 6 months with Dr Haroldine Laws. You will get a call to schedule this appointment.   Please call office at [phone] option [phone] if you have any questions or concerns.     At the [facility], you and your health needs are our priority. As part of our continuing mission to provide you with exceptional heart care, we have created designated Provider Care Teams. These Care Teams include your primary Cardiologist (physician) and Advanced Practice Providers (APPs- Physician Assistants and Nurse Practitioners) who all work together to provide you with the care you need, when you need it.   You may see any of the following providers on your designated Care Team at your next follow up: Marland Kitchen Dr Glori Bickers . Dr Loralie Champagne . Darrick Grinder, NP . Lyda Jester, PA . Audry Riles, PharmD   Please be sure to bring in all your medications bottles to every appointment.

## 2019-06-01 NOTE — Addendum Note (Signed)
Encounter addended by: Valeda Malm, RN on: 06/01/2019 3:16 PM  Actions taken: Order list changed, Diagnosis association updated

## 2019-06-02 LAB — T3, FREE: T3, Free: 2.3 pg/mL (ref 2.0–4.4)

## 2019-06-03 ENCOUNTER — Telehealth (HOSPITAL_COMMUNITY): Payer: Self-pay | Admitting: *Deleted

## 2019-06-03 MED ORDER — LEVOTHYROXINE SODIUM 25 MCG PO TABS: 25.0000 ug | ORAL_TABLET | Freq: Every day | ORAL | 3 refills | Status: DC

## 2019-06-03 MED FILL — LEVOTHYROXINE 25 MCG TABLET: 25 | 30 days supply | Qty: 30 | Fill #0

## 2019-06-03 NOTE — Telephone Encounter (Signed)
Pt aware, agreeable and verbalizes understanding. New rx sent in, lab appt sch for 3/9

## 2019-06-03 NOTE — Telephone Encounter (Signed)
-----   Message from Jolaine Artist, MD sent at 06/02/2019  3:56 PM EST ----- Thyroid low. Start synthroid 37mcg daily Repeat thyroid labs in 4 weeks

## 2019-06-10 ENCOUNTER — Ambulatory Visit (HOSPITAL_COMMUNITY)
Admission: RE | Admit: 2019-06-10 | Discharge: 2019-06-10 | Disposition: A | Discharge status: Home / Self Care | Payer: Self-pay | Source: Ambulatory Visit | Attending: Internal Medicine | Admitting: Internal Medicine

## 2019-06-10 ENCOUNTER — Other Ambulatory Visit: Payer: Self-pay

## 2019-06-10 DIAGNOSIS — I509 Heart failure, unspecified: Secondary | ICD-10-CM | POA: Insufficient documentation

## 2019-06-10 DIAGNOSIS — Z951 Presence of aortocoronary bypass graft: Secondary | ICD-10-CM | POA: Insufficient documentation

## 2019-06-10 DIAGNOSIS — I5022 Chronic systolic (congestive) heart failure: Secondary | ICD-10-CM

## 2019-06-10 NOTE — Progress Notes (Signed)
  Echocardiogram 2D Echocardiogram has been performed.  Jonathan King 06/10/2019, 3:37 PM

## 2019-06-28 ENCOUNTER — Other Ambulatory Visit (HOSPITAL_COMMUNITY): Payer: Self-pay | Admitting: *Deleted

## 2019-06-28 MED ORDER — POTASSIUM CHLORIDE CRYS ER 10 MEQ PO TBCR: 10.0000 meq | EXTENDED_RELEASE_TABLET | Freq: Every day | ORAL | 3 refills | Status: DC

## 2019-06-28 MED ORDER — CARVEDILOL 3.125 MG PO TABS: 3.1250 mg | ORAL_TABLET | Freq: Two times a day (BID) | ORAL | 3 refills | Status: DC

## 2019-06-28 MED ORDER — SPIRONOLACTONE 25 MG PO TABS: 25.0000 mg | ORAL_TABLET | Freq: Every day | ORAL | 3 refills | Status: DC

## 2019-06-28 MED ORDER — AMIODARONE HCL 200 MG PO TABS: 200.0000 mg | ORAL_TABLET | Freq: Every day | ORAL | 3 refills | Status: DC

## 2019-06-28 MED ORDER — LOSARTAN POTASSIUM 25 MG PO TABS: 25.0000 mg | ORAL_TABLET | Freq: Every day | ORAL | 3 refills | Status: DC

## 2019-06-28 MED ORDER — ATORVASTATIN CALCIUM 80 MG PO TABS: 80.0000 mg | ORAL_TABLET | Freq: Every day | ORAL | 3 refills | Status: DC

## 2019-06-28 MED FILL — LOSARTAN POTASSIUM 25 MG TA: 25 | 30 days supply | Qty: 30 | Fill #0

## 2019-06-28 MED FILL — POTASSIUM CHLORIDE ER 10 ME: 10 | 30 days supply | Qty: 30 | Fill #0

## 2019-06-28 MED FILL — AMIODARONE HCL 200 MG TAB: 200 | 30 days supply | Qty: 30 | Fill #0

## 2019-06-28 MED FILL — CARVEDILOL 3.125 MG TABLET: 3.125 | 30 days supply | Qty: 60 | Fill #0

## 2019-06-28 MED FILL — SPIRONOLACTONE 25 MG TABLET: 25 | 30 days supply | Qty: 30 | Fill #0

## 2019-06-28 MED FILL — ATORVASTATIN 80 MG TABLET: 80 | 30 days supply | Qty: 30 | Fill #0

## 2019-06-29 ENCOUNTER — Other Ambulatory Visit (HOSPITAL_COMMUNITY): Payer: Self-pay | Admitting: *Deleted

## 2019-06-29 MED FILL — LEVOTHYROXINE SODIUM 25 MCG: 25 | 30 days supply | Qty: 30 | Fill #1

## 2019-07-05 ENCOUNTER — Other Ambulatory Visit: Payer: Self-pay

## 2019-07-05 ENCOUNTER — Other Ambulatory Visit (HOSPITAL_COMMUNITY): Payer: Self-pay

## 2019-07-05 ENCOUNTER — Ambulatory Visit (HOSPITAL_COMMUNITY)
Admission: RE | Admit: 2019-07-05 | Discharge: 2019-07-05 | Disposition: A | Discharge status: Home / Self Care | Payer: Self-pay | Source: Ambulatory Visit | Attending: Cardiology | Admitting: Cardiology

## 2019-07-05 DIAGNOSIS — I5022 Chronic systolic (congestive) heart failure: Secondary | ICD-10-CM

## 2019-07-05 LAB — T4, FREE: Free T4: 0.62 ng/dL (ref 0.61–1.12)

## 2019-07-05 LAB — TSH: TSH: 12.499 u[IU]/mL — ABNORMAL HIGH (ref 0.350–4.500)

## 2019-07-06 LAB — T3, FREE: T3, Free: 2 pg/mL (ref 2.0–4.4)

## 2019-07-26 MED FILL — SPIRONOLACTONE 25 MG TABLET: 25 | 30 days supply | Qty: 30 | Fill #1

## 2019-07-26 MED FILL — LOSARTAN POTASSIUM 25 MG TA: 25 | 30 days supply | Qty: 30 | Fill #1

## 2019-07-26 MED FILL — POTASSIUM CHLORIDE ER 10 ME: 10 | 30 days supply | Qty: 30 | Fill #1

## 2019-07-26 MED FILL — AMIODARONE HCL 200 MG TAB: 200 | 30 days supply | Qty: 30 | Fill #1

## 2019-07-26 MED FILL — ATORVASTATIN 80 MG TABLET: 80 | 30 days supply | Qty: 30 | Fill #1

## 2019-07-26 MED FILL — LEVOTHYROXINE SODIUM 25 MCG: 25 | 30 days supply | Qty: 30 | Fill #2

## 2019-07-26 MED FILL — CARVEDILOL 3.125 MG TABLET: 3.125 | 30 days supply | Qty: 60 | Fill #1

## 2019-08-24 MED FILL — POTASSIUM CHLORIDE ER 10 ME: 10 | 30 days supply | Qty: 30 | Fill #2

## 2019-08-24 MED FILL — LEVOTHYROXINE SODIUM 25 MCG: 25 | 30 days supply | Qty: 30 | Fill #3

## 2019-08-24 MED FILL — AMIODARONE HCL 200 MG TAB: 200 | 30 days supply | Qty: 30 | Fill #2

## 2019-08-24 MED FILL — CARVEDILOL 3.125 MG TABLET: 3.125 | 30 days supply | Qty: 60 | Fill #2

## 2019-08-24 MED FILL — ATORVASTATIN 80 MG TABLET: 80 | 30 days supply | Qty: 30 | Fill #2

## 2019-08-24 MED FILL — SPIRONOLACTONE 25 MG TABLET: 25 | 30 days supply | Qty: 30 | Fill #2

## 2019-08-24 MED FILL — LOSARTAN POTASSIUM 25 MG TA: 25 | 30 days supply | Qty: 30 | Fill #2

## 2019-09-27 ENCOUNTER — Other Ambulatory Visit (HOSPITAL_COMMUNITY): Payer: Self-pay | Admitting: Internal Medicine

## 2019-09-27 MED FILL — LOSARTAN POTASSIUM 25 MG TA: 25 | 30 days supply | Qty: 30 | Fill #3

## 2019-09-27 MED FILL — AMIODARONE HCL 200 MG TAB: 200 | 30 days supply | Qty: 30 | Fill #3

## 2019-09-27 MED FILL — ATORVASTATIN 80 MG TABLET: 80 | 30 days supply | Qty: 30 | Fill #3

## 2019-09-27 MED FILL — LEVOTHYROXINE SODIUM 25 MCG: 25 | 30 days supply | Qty: 30 | Fill #0

## 2019-09-27 MED FILL — SPIRONOLACTONE 25 MG TABLET: 25 | 30 days supply | Qty: 30 | Fill #3

## 2019-09-27 MED FILL — CARVEDILOL 3.125 MG TABLET: 3.125 | 30 days supply | Qty: 60 | Fill #3

## 2019-09-27 MED FILL — POTASSIUM CHLORIDE ER 10 ME: 10 | 30 days supply | Qty: 30 | Fill #3

## 2019-10-26 ENCOUNTER — Other Ambulatory Visit (HOSPITAL_COMMUNITY): Payer: Self-pay | Admitting: Internal Medicine

## 2019-10-26 MED FILL — LEVOTHYROXINE SODIUM 25 MCG: 25 | 30 days supply | Qty: 30 | Fill #1

## 2019-10-26 MED FILL — ATORVASTATIN 80 MG TABLET: 80 | 30 days supply | Qty: 30 | Fill #0

## 2019-10-26 MED FILL — LOSARTAN POTASSIUM 25 MG TA: 25 | 30 days supply | Qty: 30 | Fill #0

## 2019-10-26 MED FILL — SPIRONOLACTONE 25 MG TABLET: 25 | 30 days supply | Qty: 30 | Fill #0

## 2019-10-26 MED FILL — CARVEDILOL 3.125 MG TABLET: 3.125 | 30 days supply | Qty: 60 | Fill #1

## 2019-10-26 MED FILL — AMIODARONE HCL 200 MG TAB: 200 | 30 days supply | Qty: 30 | Fill #0

## 2019-10-26 MED FILL — POTASSIUM CHLORIDE ER 10 ME: 10 | 30 days supply | Qty: 30 | Fill #0

## 2019-11-23 MED FILL — POTASSIUM CHLORIDE ER 10 ME: 10 | 30 days supply | Qty: 30 | Fill #1

## 2019-11-23 MED FILL — LEVOTHYROXINE SODIUM 25 MCG: 25 | 30 days supply | Qty: 30 | Fill #2

## 2019-11-23 MED FILL — SPIRONOLACTONE 25 MG TABLET: 25 | 30 days supply | Qty: 30 | Fill #1

## 2019-11-23 MED FILL — AMIODARONE HCL 200 MG TAB: 200 | 30 days supply | Qty: 30 | Fill #1

## 2019-11-23 MED FILL — LOSARTAN POTASSIUM 25 MG TA: 25 | 30 days supply | Qty: 30 | Fill #1

## 2019-11-23 MED FILL — ATORVASTATIN 80 MG TABLET: 80 | 30 days supply | Qty: 30 | Fill #1

## 2019-11-23 MED FILL — CARVEDILOL 3.125 MG TABLET: 3.125 | 30 days supply | Qty: 60 | Fill #2

## 2019-12-21 MED FILL — AMIODARONE HCL 200 MG TAB: 200 | 30 days supply | Qty: 30 | Fill #2

## 2019-12-21 MED FILL — POTASSIUM CHLORIDE ER 10 ME: 10 | 30 days supply | Qty: 30 | Fill #2

## 2019-12-21 MED FILL — SPIRONOLACTONE 25 MG TABLET: 25 | 30 days supply | Qty: 30 | Fill #2

## 2019-12-21 MED FILL — CARVEDILOL 3.125 MG TABLET: 3.125 | 30 days supply | Qty: 60 | Fill #3

## 2019-12-21 MED FILL — LOSARTAN POTASSIUM 25 MG TA: 25 | 30 days supply | Qty: 30 | Fill #2

## 2019-12-30 MED FILL — ATORVASTATIN 80 MG TABLET: 80 | 30 days supply | Qty: 30 | Fill #2

## 2020-01-03 ENCOUNTER — Other Ambulatory Visit (HOSPITAL_COMMUNITY): Payer: Self-pay | Admitting: Internal Medicine

## 2020-01-03 ENCOUNTER — Other Ambulatory Visit (HOSPITAL_COMMUNITY): Payer: Self-pay | Admitting: *Deleted

## 2020-01-03 MED ORDER — LEVOTHYROXINE SODIUM 25 MCG PO TABS: 25.0000 ug | ORAL_TABLET | Freq: Every day | ORAL | 2 refills | Status: DC

## 2020-01-03 MED FILL — LEVOTHYROXINE SODIUM 25 MCG: 25 | 30 days supply | Qty: 30 | Fill #0

## 2020-01-25 MED FILL — AMIODARONE HCL 200 MG TAB: 200 | 30 days supply | Qty: 30 | Fill #3

## 2020-01-25 MED FILL — SPIRONOLACTONE 25 MG TABLET: 25 | 30 days supply | Qty: 30 | Fill #3

## 2020-01-25 MED FILL — ATORVASTATIN CALCIUM 80 MG: 80 | 30 days supply | Qty: 30 | Fill #3

## 2020-01-25 MED FILL — LOSARTAN POTASSIUM 25 MG TA: 25 | 30 days supply | Qty: 30 | Fill #3

## 2020-01-25 MED FILL — CARVEDILOL 3.125 MG TABLET: 3.125 | 30 days supply | Qty: 60 | Fill #4

## 2020-01-25 MED FILL — POTASSIUM CHLORIDE ER 10 ME: 10 | 30 days supply | Qty: 30 | Fill #3

## 2020-02-08 MED FILL — LEVOTHYROXINE SODIUM 25 MCG: 25 | 30 days supply | Qty: 30 | Fill #1

## 2020-02-14 ENCOUNTER — Other Ambulatory Visit (HOSPITAL_COMMUNITY): Payer: Self-pay | Admitting: Internal Medicine

## 2020-02-14 ENCOUNTER — Other Ambulatory Visit (HOSPITAL_COMMUNITY): Payer: Self-pay | Admitting: Adult Health

## 2020-02-15 ENCOUNTER — Other Ambulatory Visit (HOSPITAL_COMMUNITY): Payer: Self-pay | Admitting: Internal Medicine

## 2020-02-20 ENCOUNTER — Other Ambulatory Visit (HOSPITAL_COMMUNITY): Payer: Self-pay | Admitting: Internal Medicine

## 2020-02-20 MED FILL — POTASSIUM CHLORIDE ER 10 ME: 10 | 30 days supply | Qty: 30 | Fill #0

## 2020-02-20 MED FILL — SPIRONOLACTONE 25 MG TABLET: 25 | 30 days supply | Qty: 30 | Fill #0

## 2020-02-20 MED FILL — AMIODARONE HCL 200 MG TAB: 200 | 30 days supply | Qty: 30 | Fill #0

## 2020-02-20 MED FILL — CARVEDILOL 3.125 MG TABLET: 3.125 | 30 days supply | Qty: 60 | Fill #0

## 2020-02-29 ENCOUNTER — Other Ambulatory Visit (HOSPITAL_COMMUNITY): Payer: Self-pay | Admitting: Internal Medicine

## 2020-02-29 ENCOUNTER — Other Ambulatory Visit (HOSPITAL_COMMUNITY): Payer: Self-pay | Admitting: *Deleted

## 2020-02-29 MED ORDER — LOSARTAN POTASSIUM 25 MG PO TABS: 25.0000 mg | ORAL_TABLET | Freq: Every day | ORAL | 3 refills | Status: DC

## 2020-02-29 MED ORDER — ATORVASTATIN CALCIUM 80 MG PO TABS: 80.0000 mg | ORAL_TABLET | Freq: Every day | ORAL | 3 refills | Status: DC

## 2020-02-29 MED FILL — LOSARTAN POTASSIUM 25 MG TA: 25 | 30 days supply | Qty: 30 | Fill #0

## 2020-02-29 MED FILL — ATORVASTATIN CALCIUM 80 MG: 80 | 30 days supply | Qty: 30 | Fill #0

## 2020-03-13 MED FILL — LEVOTHYROXINE SODIUM 25 MCG: 25 | 30 days supply | Qty: 30 | Fill #2

## 2020-03-21 MED FILL — SPIRONOLACTONE 25 MG TABLET: 25 | 30 days supply | Qty: 30 | Fill #1

## 2020-03-21 MED FILL — POTASSIUM CHLORIDE ER 10 ME: 10 | 30 days supply | Qty: 30 | Fill #1

## 2020-03-21 MED FILL — AMIODARONE HCL 200 MG TAB: 200 | 30 days supply | Qty: 30 | Fill #1

## 2020-03-21 MED FILL — CARVEDILOL 3.125 MG TABLET: 3.125 | 30 days supply | Qty: 60 | Fill #1

## 2020-03-28 MED FILL — LOSARTAN POTASSIUM 25 MG TA: 25 | 30 days supply | Qty: 30 | Fill #1

## 2020-04-02 ENCOUNTER — Ambulatory Visit (HOSPITAL_COMMUNITY)
Admission: RE | Admit: 2020-04-02 | Discharge: 2020-04-02 | Disposition: A | Discharge status: Home / Self Care | Payer: Self-pay | Source: Ambulatory Visit | Attending: Internal Medicine | Admitting: Internal Medicine

## 2020-04-02 ENCOUNTER — Other Ambulatory Visit: Payer: Self-pay

## 2020-04-02 ENCOUNTER — Encounter (HOSPITAL_COMMUNITY): Payer: Self-pay | Admitting: Internal Medicine

## 2020-04-02 VITALS — BP 120/78 | HR 76 | Wt 197.4 lb

## 2020-04-02 DIAGNOSIS — Z79899 Other long term (current) drug therapy: Secondary | ICD-10-CM | POA: Insufficient documentation

## 2020-04-02 DIAGNOSIS — R7989 Other specified abnormal findings of blood chemistry: Secondary | ICD-10-CM

## 2020-04-02 DIAGNOSIS — I2510 Atherosclerotic heart disease of native coronary artery without angina pectoris: Secondary | ICD-10-CM | POA: Insufficient documentation

## 2020-04-02 DIAGNOSIS — I5042 Chronic combined systolic (congestive) and diastolic (congestive) heart failure: Secondary | ICD-10-CM | POA: Insufficient documentation

## 2020-04-02 DIAGNOSIS — I4510 Unspecified right bundle-branch block: Secondary | ICD-10-CM | POA: Insufficient documentation

## 2020-04-02 DIAGNOSIS — R42 Dizziness and giddiness: Secondary | ICD-10-CM | POA: Insufficient documentation

## 2020-04-02 DIAGNOSIS — I493 Ventricular premature depolarization: Secondary | ICD-10-CM

## 2020-04-02 DIAGNOSIS — Z951 Presence of aortocoronary bypass graft: Secondary | ICD-10-CM | POA: Insufficient documentation

## 2020-04-02 DIAGNOSIS — Z7982 Long term (current) use of aspirin: Secondary | ICD-10-CM | POA: Insufficient documentation

## 2020-04-02 DIAGNOSIS — I4891 Unspecified atrial fibrillation: Secondary | ICD-10-CM | POA: Insufficient documentation

## 2020-04-02 DIAGNOSIS — R911 Solitary pulmonary nodule: Secondary | ICD-10-CM | POA: Insufficient documentation

## 2020-04-02 HISTORY — DX: Heart failure, unspecified: I50.9

## 2020-04-02 LAB — COMPREHENSIVE METABOLIC PANEL
ALT: 49 U/L — ABNORMAL HIGH (ref 0–44)
AST: 33 U/L (ref 15–41)
Albumin: 3.7 g/dL (ref 3.5–5.0)
Alkaline Phosphatase: 86 U/L (ref 38–126)
Anion gap: 8 (ref 5–15)
BUN: 15 mg/dL (ref 6–20)
CO2: 24 mmol/L (ref 22–32)
Calcium: 9 mg/dL (ref 8.9–10.3)
Chloride: 103 mmol/L (ref 98–111)
Creatinine, Ser: 1.06 mg/dL (ref 0.61–1.24)
GFR, Estimated: >60 mL/min (ref >60)
Glucose, Bld: 116 mg/dL — ABNORMAL HIGH (ref 70–99)
Potassium: 3.9 mmol/L (ref 3.5–5.1)
Sodium: 135 mmol/L (ref 135–145)
Total Bilirubin: 0.6 mg/dL (ref 0.3–1.2)
Total Protein: 7.3 g/dL (ref 6.5–8.1)

## 2020-04-02 LAB — TSH: TSH: 10.981 u[IU]/mL — ABNORMAL HIGH (ref 0.350–4.500)

## 2020-04-02 LAB — T4, FREE: Free T4: 0.75 ng/dL (ref 0.61–1.12)

## 2020-04-02 MED FILL — ATORVASTATIN CALCIUM 80 MG: 80 | 30 days supply | Qty: 30 | Fill #1

## 2020-04-02 NOTE — Progress Notes (Signed)
ADVANCED HF CLINIC NOTE  Primary HF Cardiologist: Dr Haroldine Laws CT Surgery: Dr Prescott Gum PCP: Dr. Charlott Rakes  HPI: 54 year old patient with a history of cholecystectomy in 2013, CAD, S/P CABG, left lung nodule.   Admitted with increase shortness of breath. Echo 12/11/18 LVEF 15-20% with moderate RV dysfunction .Chest CT 8/17  with suggestion of severe CAD with totally occluded LAD and high-grade ramus lesion. Also LLL nodule suggestive of lung CA (versus infection). He underwent CABG x3 on 12/21/18. Post operative course was uneventful. He was discharged to [facility] with [facility] on 12/30/2018.   Had follow up with CT surgery. Plan for CT scan of chest to reassess possible mass in a couple of months. Amio was stopped at that visit.   CT chest 11/20 resolution of infiltrate/nodule  Today he returns for HF follow up. Overall feeling very good. Weight stable at 185. Denies CP, SOB, orthopnea, edema or PND. Working at [employer]. Feels lightheaded if he gets up too quickly, subsides quickly. Weights stable. Working out on [place] 3x/week for about 15 minutes at a time.  Echo 2/21: 30-35%, RV mildly reduced. Echo 11/2018: EF 15%. RV moderately reduced.    RHC/LHC 12/16/18 RIGHT/LEFT HEART CATH AND CORONARY ANGIOGRAPHY  Conclusion    RPDA lesion is 70% stenosed.  Ost LAD to Prox LAD lesion is 99% stenosed.  Prox LAD to Mid LAD lesion is 100% stenosed.  1st Diag lesion is 99% stenosed.  Ramus lesion is 90% stenosed.  Findings:  Ao = 88/51 (68) LV = 86/17 RA = 3 RV = 46/5 PA = 45/10 (23) PCW = 17 Fick cardiac output/index = 4.2/2.0 PVR = 1.5 WU Ao sat = 98% PA sat = 67%, 68%  Assessment: 1. 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 Well-compensated filling pressures with moderately reduced CO   ROS: All systems negative except as listed in HPI, PMH and Problem List.  SH:  Social History   Socioeconomic History  .  Marital status: Divorced    Spouse name: Not on file  . Number of children: Not on file  . Years of education: Not on file  . Highest education level: Not on file  Occupational History  . Not on file  Tobacco Use  . Smoking status: Never Smoker  . Smokeless tobacco: Never Used  Substance and Sexual Activity  . Alcohol use: Yes    Comment: occ  . Drug use: No  . Sexual activity: Not on file  Other Topics Concern  . Not on file  Social History Narrative  . Not on file   Social Determinants of Health   Financial Resource Strain:   . Difficulty of Paying Living Expenses: Not on file  Food Insecurity:   . Worried About Charity fundraiser in the Last Year: Not on file  . Ran Out of Food in the Last Year: Not on file  Transportation Needs:   . Lack of Transportation (Medical): Not on file  . Lack of Transportation (Non-Medical): Not on file  Physical Activity:   . Days of Exercise per Week: Not on file  . Minutes of Exercise per Session: Not on file  Stress:   . Feeling of Stress : Not on file  Social Connections:   . Frequency of Communication with Friends and Family: Not on file  . Frequency of Social Gatherings with Friends and Family: Not on file  . Attends  Religious Services: Not on file  . Active Member of Clubs or Organizations: Not on file  . Attends Archivist Meetings: Not on file  . Marital Status: Not on file  Intimate Partner Violence:   . Fear of Current or Ex-Partner: Not on file  . Emotionally Abused: Not on file  . Physically Abused: Not on file  . Sexually Abused: Not on file    FH:  Family History  Problem Relation Age of Onset  . Parkinson's disease Mother     Past Medical History:  Diagnosis Date  . Back pain   . CHF (congestive heart failure) (HCC)     Current Outpatient Medications  Medication Sig Dispense Refill  . amiodarone (PACERONE) 200 MG tablet TAKE 1 TABLET (200 MG TOTAL) BY MOUTH DAILY. 30 tablet 3  . aspirin EC 81 MG  tablet Take 1 tablet (81 mg total) by mouth daily. 30 tablet 3  . atorvastatin (LIPITOR) 80 MG tablet Take 1 tablet (80 mg total) by mouth daily at 6 PM. 30 tablet 3  . carvedilol (COREG) 3.125 MG tablet TAKE 1 TABLET (3.125 MG TOTAL) BY MOUTH 2 (TWO) TIMES DAILY. 60 tablet 3  . losartan (COZAAR) 25 MG tablet Take 1 tablet (25 mg total) by mouth daily. 30 tablet 3  . potassium chloride (KLOR-CON) 10 MEQ tablet TAKE 1 TABLET (10 MEQ TOTAL) BY MOUTH DAILY. 30 tablet 3  . spironolactone (ALDACTONE) 25 MG tablet TAKE 1 TABLET (25 MG TOTAL) BY MOUTH DAILY. 30 tablet 3   No current facility-administered medications for this encounter.    Vitals:   04/02/20 1401  BP: 120/78  Pulse: 76  SpO2: 96%  Weight: 89.5 kg (197 lb 6.4 oz)   Wt Readings from Last 3 Encounters:  04/02/20 89.5 kg (197 lb 6.4 oz)  06/01/19 89.1 kg (196 lb 6.4 oz)  04/12/19 87.5 kg (193 lb)    PHYSICAL EXAM: General:  Well appearing. No resp difficulty HEENT: normal Neck: supple. no JVD. Carotids 2+ bilat; no bruits. No lymphadenopathy or thryomegaly appreciated. Cor: PMI nondisplaced, distant. Regular rate & rhythm. No rubs, gallops or murmurs. Lungs: clear, diminished in bases Abdomen: soft, nontender, nondistended. No hepatosplenomegaly. No bruits or masses. Good bowel sounds. Extremities: no cyanosis, clubbing, rash, edema Neuro: alert & oriented x 3, cranial nerves grossly intact. moves all 4 extremities w/o difficulty. Affect pleasant  ECG: NSR 73 bpm, RBBB with occasional monomorphic PVCs (Personally reviewed).  ASSESSMENT & PLAN:  1. CAD s/p CABG x3 on 12/21/18 - Continue ASA and statin - Doing well. No s/s angina - Continue ASA, statin & b-blocker  2. Post Operative Atrial Fibrillation:  - In NSR today  - Continue amio for PVC  3. Chronic Systolic CHF, ICM : - Echo 8/20 EF 20-25%  - Echo 11/20 EF 20-25% - Echo 2/21 EF 30-35% - NYHA II. Volume status stable. He does not need lasix.  - Continue  losartan 25 mg qhs. (BP too low to switch to [employer] or titrate upward with orthostasis) - Continue 3.125 mg carvedilol bid  - Continue spiro mg daily - No insurance currently, unable to start [place] today - repeat ECHO, if EF<35%, will need to discuss ICD and EF referral  4. High Burden PVCs:  ~ 20% burden initially. Suspect likely contributing to cardiomyopathy. - suppressed on amio, suppressed on ECG today - Amio stopped 01/26/19. PVCs restarted 11/20 - Zio 2/21 no AF, mostly in SR w/ 1st degree HB,  some ventricular bigeminy and trigeminy present - Continue Amio 200 daily  - CMET & TSH today, needs regular eye exam   Glori Bickers MD 2:58 PM

## 2020-04-02 NOTE — Addendum Note (Signed)
Encounter addended by: Jolaine Artist, MD on: 04/02/2020 5:14 PM  Actions taken: Level of Service modified, Visit diagnoses modified

## 2020-04-02 NOTE — Patient Instructions (Signed)
Labs done today, we will call you for any abnormal results  Your physician has requested that you have an echocardiogram. Echocardiography is a painless test that uses sound waves to create images of your heart. It provides your doctor with information about the size and shape of your heart and how well your heart's chambers and valves are working. This procedure takes approximately one hour. There are no restrictions for this procedure.  Please call our office to follow up in 6 months  If you have any questions or concerns before your next appointment please send [place] a message through [place] or call our office at [phone].    TO LEAVE A MESSAGE FOR THE NURSE SELECT OPTION 2, PLEASE LEAVE A MESSAGE INCLUDING: . YOUR NAME . DATE OF BIRTH . CALL BACK NUMBER . REASON FOR CALL**this is important as we prioritize the call backs  [place] AS LONG AS YOU CALL BEFORE 4:00 PM  At the [facility], you and your health needs are our priority. As part of our continuing mission to provide you with exceptional heart care, we have created designated Provider Care Teams. These Care Teams include your primary Cardiologist (physician) and Advanced Practice Providers (APPs- Physician Assistants and Nurse Practitioners) who all work together to provide you with the care you need, when you need it.   You may see any of the following providers on your designated Care Team at your next follow up: Marland Kitchen Dr Glori Bickers . Dr Loralie Champagne . Darrick Grinder, NP . Lyda Jester, PA . Audry Riles, PharmD   Please be sure to bring in all your medications bottles to every appointment.

## 2020-04-03 LAB — T3, FREE: T3, Free: 2.4 pg/mL (ref 2.0–4.4)

## 2020-04-16 ENCOUNTER — Other Ambulatory Visit (HOSPITAL_COMMUNITY): Payer: Self-pay | Admitting: Internal Medicine

## 2020-04-18 ENCOUNTER — Other Ambulatory Visit (HOSPITAL_COMMUNITY): Payer: Self-pay | Admitting: Internal Medicine

## 2020-04-19 ENCOUNTER — Encounter (HOSPITAL_COMMUNITY): Payer: Self-pay | Admitting: Internal Medicine

## 2020-04-19 ENCOUNTER — Telehealth (HOSPITAL_COMMUNITY): Payer: Self-pay | Admitting: *Deleted

## 2020-04-19 NOTE — Telephone Encounter (Signed)
Pt left VM requesting lab results. I called pt back no answer/left vm requesting return call.

## 2020-04-24 ENCOUNTER — Other Ambulatory Visit (HOSPITAL_COMMUNITY): Payer: Self-pay | Admitting: Internal Medicine

## 2020-04-24 MED FILL — POTASSIUM CHLORIDE ER 10 ME: 10 | 30 days supply | Qty: 30 | Fill #2

## 2020-04-24 MED FILL — AMIODARONE HCL 200 MG TAB: 200 | 30 days supply | Qty: 30 | Fill #2

## 2020-04-24 MED FILL — SPIRONOLACTONE 25 MG TABLET: 25 | 30 days supply | Qty: 30 | Fill #2

## 2020-04-24 MED FILL — CARVEDILOL 3.125 MG TABLET: 3.125 | 30 days supply | Qty: 60 | Fill #2

## 2020-05-03 ENCOUNTER — Ambulatory Visit (HOSPITAL_COMMUNITY)
Admission: RE | Admit: 2020-05-03 | Discharge: 2020-05-03 | Disposition: A | Discharge status: Home / Self Care | Payer: Self-pay | Source: Ambulatory Visit | Attending: Family Medicine | Admitting: Family Medicine

## 2020-05-03 ENCOUNTER — Other Ambulatory Visit: Payer: Self-pay

## 2020-05-03 DIAGNOSIS — I5042 Chronic combined systolic (congestive) and diastolic (congestive) heart failure: Secondary | ICD-10-CM | POA: Insufficient documentation

## 2020-05-03 DIAGNOSIS — Z951 Presence of aortocoronary bypass graft: Secondary | ICD-10-CM | POA: Insufficient documentation

## 2020-05-03 LAB — ECHOCARDIOGRAM COMPLETE
Area-P 1/2: 2.62 cm2
Calc EF: 47 %
S' Lateral: 3.7 cm
Single Plane A2C EF: 47.9 %
Single Plane A4C EF: 47.5 %

## 2020-05-03 NOTE — Progress Notes (Signed)
  Echocardiogram 2D Echocardiogram has been performed.  Jonathan King 05/03/2020, 2:43 PM

## 2020-05-07 MED FILL — ATORVASTATIN CALCIUM 80 MG: 80 | 30 days supply | Qty: 30 | Fill #2

## 2020-05-24 MED FILL — AMIODARONE HCL 200 MG TAB: 200 | 30 days supply | Qty: 30 | Fill #3

## 2020-05-24 MED FILL — SPIRONOLACTONE 25 MG TABLET: 25 | 30 days supply | Qty: 30 | Fill #3

## 2020-05-24 MED FILL — POTASSIUM CHLORIDE ER 10 ME: 10 | 30 days supply | Qty: 30 | Fill #3

## 2020-05-24 MED FILL — CARVEDILOL 3.125 MG TABLET: 3.125 | 30 days supply | Qty: 60 | Fill #3

## 2020-06-06 MED FILL — ATORVASTATIN CALCIUM 80 MG: 80 | 30 days supply | Qty: 30 | Fill #3

## 2020-06-20 ENCOUNTER — Other Ambulatory Visit (HOSPITAL_COMMUNITY): Payer: Self-pay | Admitting: Internal Medicine

## 2020-06-20 MED FILL — POTASSIUM CHLORIDE ER 10 ME: 10 | 30 days supply | Qty: 30 | Fill #0

## 2020-06-20 MED FILL — SPIRONOLACTONE 25 MG TABLET: 25 | 30 days supply | Qty: 30 | Fill #0

## 2020-06-20 MED FILL — AMIODARONE HCL 200 MG TAB: 200 | 30 days supply | Qty: 30 | Fill #0

## 2020-06-20 MED FILL — CARVEDILOL 3.125 MG TABLET: 3.125 | 30 days supply | Qty: 60 | Fill #0

## 2020-07-09 ENCOUNTER — Other Ambulatory Visit (HOSPITAL_COMMUNITY): Payer: Self-pay | Admitting: Internal Medicine

## 2020-07-20 MED FILL — AMIODARONE HCL 200 MG TAB: 200 | 30 days supply | Qty: 30 | Fill #1

## 2020-07-20 MED FILL — POTASSIUM CHLORIDE ER 10 ME: 10 | 30 days supply | Qty: 30 | Fill #1

## 2020-07-20 MED FILL — SPIRONOLACTONE 25 MG TABLET: 25 | 30 days supply | Qty: 30 | Fill #1

## 2020-07-20 MED FILL — CARVEDILOL 3.125 MG TABLET: 3.125 | 30 days supply | Qty: 60 | Fill #1

## 2020-07-27 ENCOUNTER — Other Ambulatory Visit (HOSPITAL_COMMUNITY): Payer: Self-pay | Admitting: Emergency Medicine

## 2020-07-27 ENCOUNTER — Emergency Department (HOSPITAL_COMMUNITY)
Admission: EM | Admit: 2020-07-27 | Discharge: 2020-07-27 | Disposition: A | Discharge status: Home / Self Care | Payer: Medicaid Other | Attending: Emergency Medicine | Admitting: Emergency Medicine

## 2020-07-27 ENCOUNTER — Encounter (HOSPITAL_COMMUNITY): Payer: Self-pay | Admitting: Emergency Medicine

## 2020-07-27 ENCOUNTER — Emergency Department (HOSPITAL_COMMUNITY): Payer: Medicaid Other

## 2020-07-27 DIAGNOSIS — R059 Cough, unspecified: Secondary | ICD-10-CM | POA: Insufficient documentation

## 2020-07-27 DIAGNOSIS — R058 Other specified cough: Secondary | ICD-10-CM

## 2020-07-27 DIAGNOSIS — Z20822 Contact with and (suspected) exposure to covid-19: Secondary | ICD-10-CM | POA: Insufficient documentation

## 2020-07-27 DIAGNOSIS — Z951 Presence of aortocoronary bypass graft: Secondary | ICD-10-CM | POA: Insufficient documentation

## 2020-07-27 DIAGNOSIS — I5041 Acute combined systolic (congestive) and diastolic (congestive) heart failure: Secondary | ICD-10-CM | POA: Insufficient documentation

## 2020-07-27 DIAGNOSIS — Z79899 Other long term (current) drug therapy: Secondary | ICD-10-CM | POA: Insufficient documentation

## 2020-07-27 DIAGNOSIS — Z7982 Long term (current) use of aspirin: Secondary | ICD-10-CM | POA: Insufficient documentation

## 2020-07-27 LAB — COMPREHENSIVE METABOLIC PANEL
ALT: 28 U/L (ref 0–44)
AST: 25 U/L (ref 15–41)
Albumin: 3.6 g/dL (ref 3.5–5.0)
Alkaline Phosphatase: 94 U/L (ref 38–126)
Anion gap: 5 (ref 5–15)
BUN: 18 mg/dL (ref 6–20)
CO2: 27 mmol/L (ref 22–32)
Calcium: 9.1 mg/dL (ref 8.9–10.3)
Chloride: 103 mmol/L (ref 98–111)
Creatinine, Ser: 1.08 mg/dL (ref 0.61–1.24)
GFR, Estimated: >60 mL/min (ref >60)
Glucose, Bld: 88 mg/dL (ref 70–99)
Potassium: 3.9 mmol/L (ref 3.5–5.1)
Sodium: 135 mmol/L (ref 135–145)
Total Bilirubin: 0.8 mg/dL (ref 0.3–1.2)
Total Protein: 7.2 g/dL (ref 6.5–8.1)

## 2020-07-27 LAB — CBC
HCT: 42.3 % (ref 39.0–52.0)
Hemoglobin: 13.7 g/dL (ref 13.0–17.0)
MCH: 30.9 pg (ref 26.0–34.0)
MCHC: 32.4 g/dL (ref 30.0–36.0)
MCV: 95.3 fL (ref 80.0–100.0)
Platelets: 245 10*3/uL (ref 150–400)
RBC: 4.44 MIL/uL (ref 4.22–5.81)
RDW: 14.2 % (ref 11.5–15.5)
WBC: 6.9 10*3/uL (ref 4.0–10.5)
nRBC: 0 % (ref 0.0–0.2)

## 2020-07-27 LAB — BRAIN NATRIURETIC PEPTIDE: B Natriuretic Peptide: 41.7 pg/mL (ref 0.0–100.0)

## 2020-07-27 LAB — POC SARS CORONAVIRUS 2 AG -  ED: SARS Coronavirus 2 Ag: NEGATIVE

## 2020-07-27 MED ORDER — BENZONATATE 100 MG PO CAPS: 100.0000 mg | ORAL_CAPSULE | Freq: Two times a day (BID) | ORAL | 0 refills | Status: DC | PRN

## 2020-07-27 MED FILL — BENZONATATE 100 MG CAPS: 100 | 10 days supply | Qty: 20 | Fill #0

## 2020-07-27 NOTE — ED Provider Notes (Signed)
[facility] EMERGENCY DEPARTMENT Provider Note   CSN: [id number] Arrival date & time: 07/27/20  1053     History CC:  Cough  Jonathan King is a 55 y.o. male with a history of congestive heart failure on spironolactone, Coreg, presenting emergency department with dry cough.  He reports onset about a week ago.  He reports persistent dry cough, no better or worse with exertion, unrelated to positioning or lying down.  He denies fevers, chills, chest pain or pressure.  He denies leg swelling.  HPI     Past Medical History:  Diagnosis Date  . Back pain   . CHF (congestive heart failure) [facility])     Patient Active Problem List   Diagnosis Date Noted  . Solitary pulmonary nodule 12/27/2018  . 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 SOB (shortness of breath)   . Aspiration pneumonia of right lower lobe ([place])   . Lung mass   . Hypokalemia 12/12/2018  . Hyponatremia 12/12/2018  . Hypochloremia 12/12/2018  . Elevated ALT measurement 12/12/2018  . Leukocytosis 12/12/2018  . Elevated TSH 12/12/2018  . NSVT (nonsustained ventricular tachycardia) ([place]) 12/11/2018  . 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 ([place]) 12/11/2018  . Pre-diabetes 12/11/2018  . GERD (gastroesophageal reflux disease) 12/11/2018  . Cough 12/11/2018  . Acute systolic (congestive) heart failure ([place]) 12/10/2018  . MRSA infection 05/23/2013  . Acute cholecystitis 01/30/2012    Past Surgical History:  Procedure Laterality Date  . CHOLECYSTECTOMY  01/31/2012   Procedure: LAPAROSCOPIC CHOLECYSTECTOMY;  Surgeon: Gayland Curry, MD,FACS;  Location: [place];  Service: General;  Laterality: N/A;  . CORONARY ARTERY BYPASS GRAFT N/A 12/21/2018   Procedure: CORONARY ARTERY BYPASS GRAFTING (CABG) x 3, USING LEFT INTERNAL MAMMARY ARTERY (LIMA) AND RIGHT GREATER SAPHENOUS VEIN (SVG) HARVESTED ENDOSCOPICALLY;    LIMA-LAD, SVG-RAMUS, SVG-DIAG;  Surgeon: Ivin Poot, MD;  Location: [place];  Service: Open  Heart Surgery;  Laterality: N/A;  . RIGHT/LEFT HEART CATH AND CORONARY ANGIOGRAPHY N/A 12/16/2018   Procedure: RIGHT/LEFT HEART CATH AND CORONARY ANGIOGRAPHY;  Surgeon: Jolaine Artist, MD;  Location: [place] [facility];  Service: Cardiovascular;  Laterality: N/A;  . TEE WITHOUT CARDIOVERSION N/A 12/21/2018   Procedure: TRANSESOPHAGEAL ECHOCARDIOGRAM (TEE);  Surgeon: Prescott Gum, Collier Salina, MD;  Location: [place];  Service: Open Heart Surgery;  Laterality: N/A;       Family History  Problem Relation Age of Onset  . Parkinson's disease Mother     Social History   Tobacco Use  . Smoking status: Never Smoker  . Smokeless tobacco: Never Used  Substance Use Topics  . Alcohol use: Yes    Comment: occ  . Drug use: No    Home Medications Prior to Admission medications   Medication Sig Start Date End Date Taking? Authorizing Provider  benzonatate (TESSALON) 100 MG capsule Take 1 capsule (100 mg total) by mouth 2 (two) times daily as needed for up to 10 days for cough. 07/27/20 08/06/20 Yes Leeon Makar, Carola Rhine, MD  amiodarone (PACERONE) 200 MG tablet TAKE 1 TABLET (200 MG TOTAL) BY MOUTH DAILY. 06/20/20   Bensimhon, Shaune Pascal, MD  aspirin EC 81 MG tablet Take 1 tablet (81 mg total) by mouth daily. 02/28/19   Bensimhon, Shaune Pascal, MD  atorvastatin (LIPITOR) 80 MG tablet TAKE 1 TABLET (80 MG TOTAL) BY MOUTH DAILY AT 6 PM. 07/09/20   Bensimhon, Shaune Pascal, MD  carvedilol (COREG) 3.125 MG tablet TAKE 1 TABLET (3.125 MG  TOTAL) BY MOUTH 2 (TWO) TIMES DAILY. 06/20/20 09/18/20  Bensimhon, Shaune Pascal, MD  losartan (COZAAR) 25 MG tablet Take 1 tablet (25 mg total) by mouth daily. 02/29/20   Bensimhon, Shaune Pascal, MD  potassium chloride (KLOR-CON) 10 MEQ tablet TAKE 1 TABLET (10 MEQ TOTAL) BY MOUTH DAILY. 06/20/20   Bensimhon, Shaune Pascal, MD  spironolactone (ALDACTONE) 25 MG tablet TAKE 1 TABLET (25 MG TOTAL) BY MOUTH DAILY. 06/20/20   Bensimhon, Shaune Pascal, MD    Allergies    Patient has no known allergies.  Review of Systems    Review of Systems  Constitutional: Negative for chills and fever.  Eyes: Negative for pain and visual disturbance.  Respiratory: Positive for cough and chest tightness.   Cardiovascular: Negative for chest pain and palpitations.  Gastrointestinal: Negative for abdominal pain and vomiting.  Musculoskeletal: Negative for arthralgias and back pain.  Skin: Negative for color change and rash.  Neurological: Negative for syncope and headaches.  All other systems reviewed and are negative.   Physical Exam Updated Vital Signs BP 112/76 (BP Location: Left Arm)   Pulse 66   Temp 98.3 F (36.8 C) (Oral)   Resp 18   SpO2 92%   Physical Exam Constitutional:      General: He is not in acute distress. HENT:     Head: Normocephalic and atraumatic.  Eyes:     Conjunctiva/sclera: Conjunctivae normal.     Pupils: Pupils are equal, round, and reactive to light.  Cardiovascular:     Rate and Rhythm: Normal rate and regular rhythm.     Pulses: Normal pulses.  Pulmonary:     Effort: Pulmonary effort is normal. No respiratory distress.  Abdominal:     General: There is no distension.     Tenderness: There is no abdominal tenderness.  Skin:    General: Skin is warm and dry.  Neurological:     General: No focal deficit present.     Mental Status: He is alert and oriented to person, place, and time. Mental status is at baseline.  Psychiatric:        Mood and Affect: Mood normal.        Behavior: Behavior normal.     ED Results / Procedures / Treatments   Labs (all labs ordered are listed, but only abnormal results are displayed) Labs Reviewed  CBC  COMPREHENSIVE METABOLIC PANEL  BRAIN NATRIURETIC PEPTIDE  POC SARS CORONAVIRUS 2 AG -  ED    EKG None  Radiology DG Chest 2 View  Result Date: 07/27/2020 CLINICAL DATA:  Cough for 1 week. EXAM: CHEST - 2 VIEW COMPARISON:  01/26/2019 FINDINGS: Prior median sternotomy and evidence for CABG procedure. Both lungs are clear. No pulmonary  edema. No pleural effusions. Heart size is normal. Negative for a pneumothorax. No acute bone abnormality. IMPRESSION: No active cardiopulmonary disease. Electronically Signed   By: Markus Daft M.D.   On: 07/27/2020 11:58    Procedures Procedures   Medications Ordered in ED Medications - No data to display  ED Course  I have reviewed the triage vital signs and the nursing notes.  Pertinent labs & imaging results that were available during my care of the patient were reviewed by me and considered in my medical decision making (see chart for details).  Dry cough for 1 week.  The patient is clinically well-appearing.  No hypoxia.  No evidence of volume overload or CHF exacerbation.  Differential diagnosis includes allergies versus viral infection versus other.  X-ray and labs reviewed.  No focal consolidations on x-ray to suggest pneumonia or pulmonary mass.  No pulmonary edema or effusion noted.  No leukocytosis.  I have a lower suspicion for infection, blood clot, or pulmonary mass.  EKG reviewed, does not seem consistent with ACS.  He has no chest pressure.  Discussed continuing over-the-counter medications.  We can try [employer] as well.  Okay for discharge.  Advised to follow-up with his doctor.    Final Clinical Impression(s) / ED Diagnoses Final diagnoses:  Dry cough    Rx / DC Orders ED Discharge Orders         Ordered    benzonatate (TESSALON) 100 MG capsule  2 times daily PRN        07/27/20 1308           Wyvonnia Dusky, MD 07/27/20 1747

## 2020-07-27 NOTE — ED Provider Notes (Addendum)
MSE was initiated and I personally evaluated the patient and placed orders (if any) at  11:12 AM on July 27, 2020.  The patient appears stable so that the remainder of the MSE may be completed by another provider.  Patient placed in Quick Look pathway, seen and evaluated   Chief Complaint: Cough  HPI:    55 y.o. M with PMH/o pulmonary nodule, CHF, NSVT, GERD who presents for evaluation of cough x 1 week. Associated with body aches. Cough is sometimes intermittently productive of phlegm. He has not been vaccinated for COVID. No COVID exposure that he knows of.    ROS: cough (one)  Physical Exam:   Gen: No distress  Neuro: Awake and Alert  Skin: Warm    Focused Exam: Intermittently coughing, No evidence of respiratory distress. Able to speak in full sentences without any difficulty.    Initiation of care has begun. The patient has been counseled on the process, plan, and necessity for staying for the completion/evaluation, and the remainder of the medical screening examination   Portions of this note were generated with Dragon dictation software. Dictation errors may occur despite best attempts at proofreading.    Volanda Napoleon, PA-C 07/27/20 1120    Volanda Napoleon, PA-C 07/27/20 1129    Wyvonnia Dusky, MD 07/27/20 1236

## 2020-07-27 NOTE — ED Triage Notes (Signed)
Pt here with c/o cough for 1 week , no fevers no body aches

## 2020-08-06 ENCOUNTER — Other Ambulatory Visit: Payer: Self-pay | Admitting: Emergency Medicine

## 2020-08-08 ENCOUNTER — Other Ambulatory Visit: Payer: Self-pay

## 2020-08-08 MED FILL — Atorvastatin Calcium Tab 80 MG (Base Equivalent): ORAL | 30 days supply | Qty: 30 | Fill #0 | Status: AC

## 2020-08-14 ENCOUNTER — Other Ambulatory Visit: Payer: Self-pay

## 2020-08-14 MED FILL — Carvedilol Tab 3.125 MG: ORAL | 30 days supply | Qty: 60 | Fill #0 | Status: AC

## 2020-08-14 MED FILL — Potassium Chloride Tab ER 10 mEq: ORAL | 30 days supply | Qty: 30 | Fill #0 | Status: AC

## 2020-08-14 MED FILL — Amiodarone HCl Tab 200 MG: ORAL | 30 days supply | Qty: 30 | Fill #0 | Status: AC

## 2020-08-14 MED FILL — Spironolactone Tab 25 MG: ORAL | 30 days supply | Qty: 30 | Fill #0 | Status: AC

## 2020-08-27 ENCOUNTER — Other Ambulatory Visit: Payer: Self-pay

## 2020-09-11 ENCOUNTER — Other Ambulatory Visit: Payer: Self-pay

## 2020-09-11 MED FILL — Atorvastatin Calcium Tab 80 MG (Base Equivalent): ORAL | 30 days supply | Qty: 30 | Fill #1 | Status: AC

## 2020-09-18 ENCOUNTER — Other Ambulatory Visit: Payer: Self-pay

## 2020-09-18 MED FILL — Amiodarone HCl Tab 200 MG: ORAL | 30 days supply | Qty: 30 | Fill #1 | Status: AC

## 2020-09-18 MED FILL — Spironolactone Tab 25 MG: ORAL | 30 days supply | Qty: 30 | Fill #1 | Status: AC

## 2020-09-18 MED FILL — Carvedilol Tab 3.125 MG: ORAL | 30 days supply | Qty: 60 | Fill #1 | Status: AC

## 2020-09-18 MED FILL — Potassium Chloride Tab ER 10 mEq: ORAL | 30 days supply | Qty: 30 | Fill #1 | Status: AC

## 2020-10-08 ENCOUNTER — Emergency Department (HOSPITAL_COMMUNITY)
Admission: EM | Admit: 2020-10-08 | Discharge: 2020-10-08 | Disposition: A | Discharge status: Home / Self Care | Payer: Self-pay | Attending: Emergency Medicine | Admitting: Emergency Medicine

## 2020-10-08 ENCOUNTER — Emergency Department (HOSPITAL_COMMUNITY): Payer: Self-pay

## 2020-10-08 ENCOUNTER — Other Ambulatory Visit: Payer: Self-pay

## 2020-10-08 DIAGNOSIS — Z5321 Procedure and treatment not carried out due to patient leaving prior to being seen by health care provider: Secondary | ICD-10-CM | POA: Insufficient documentation

## 2020-10-08 DIAGNOSIS — R059 Cough, unspecified: Secondary | ICD-10-CM | POA: Insufficient documentation

## 2020-10-08 NOTE — ED Triage Notes (Signed)
Pt reports cough since being dx with bronchitis in April.

## 2020-10-08 NOTE — ED Notes (Signed)
Called pt x3 for vitals, will try again in 15-20 mins.

## 2020-10-08 NOTE — ED Notes (Signed)
Called patient to update V/S, unable to locate at this time.

## 2020-10-11 ENCOUNTER — Ambulatory Visit (HOSPITAL_COMMUNITY)
Admission: EM | Admit: 2020-10-11 | Discharge: 2020-10-11 | Disposition: A | Discharge status: Home / Self Care | Payer: Self-pay | Attending: Internal Medicine | Admitting: Internal Medicine

## 2020-10-11 ENCOUNTER — Encounter (HOSPITAL_COMMUNITY): Payer: Self-pay | Admitting: Emergency Medicine

## 2020-10-11 ENCOUNTER — Other Ambulatory Visit: Payer: Self-pay

## 2020-10-11 DIAGNOSIS — R058 Other specified cough: Secondary | ICD-10-CM

## 2020-10-11 MED ORDER — HYDROCODONE BIT-HOMATROP MBR 5-1.5 MG/5ML PO SOLN
5.0000 mL | Freq: Four times a day (QID) | ORAL | 0 refills | Status: DC | PRN
  Filled 2020-10-11: qty 120, 6d supply, fill #0

## 2020-10-11 MED ORDER — PREDNISONE 20 MG PO TABS
20.0000 mg | ORAL_TABLET | Freq: Every day | ORAL | 0 refills | Status: AC
  Filled 2020-10-11: qty 5, 5d supply, fill #0

## 2020-10-11 MED ORDER — PROMETHAZINE-CODEINE 6.25-10 MG/5ML PO SYRP
5.0000 mL | ORAL_SOLUTION | Freq: Three times a day (TID) | ORAL | 0 refills | Status: DC | PRN
  Filled 2020-10-11: qty 180, 12d supply, fill #0

## 2020-10-11 MED FILL — Atorvastatin Calcium Tab 80 MG (Base Equivalent): ORAL | 30 days supply | Qty: 30 | Fill #2 | Status: AC

## 2020-10-11 NOTE — ED Triage Notes (Signed)
Cough for 2 months.  Seen at ED, diagnosed with bronchitis.  States he has continued to cough.

## 2020-10-11 NOTE — ED Provider Notes (Addendum)
[facility]    CSN: [id number] Arrival date & time: 10/11/20  0845      History   Chief Complaint Chief Complaint  Patient presents with   Cough    HPI Jonathan King is a 55 y.o. male comes to urgent care with history of nonproductive cough which started about a month ago.  Patient was seen in the emergency department and given some antitussive agents.  At the time patient had a negative chest x-ray.  Patient comes to the urgent care complaining of persistent cough with no shortness of breath, wheezing, orthopnea or paroxysmal nocturnal dyspnea.  No fever or chills.  No chest pain or chest pressure.  Patient denies any weight changes.  No lower extremity swelling.  No postnasal drip or nasal congestion.  No recent ACE inhibitor use or discontinuation.  Patient is on losartan. 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   SOB (shortness of breath)    Aspiration pneumonia of right lower lobe ([facility])    Lung mass    Hypokalemia 12/12/2018   Hyponatremia 12/12/2018   Hypochloremia 12/12/2018   Elevated ALT measurement 12/12/2018   Leukocytosis 12/12/2018   Elevated TSH 12/12/2018   NSVT (nonsustained ventricular tachycardia) ([place]) 12/11/2018   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 ([place]) 12/11/2018   Pre-diabetes 12/11/2018   GERD (gastroesophageal reflux disease) 12/11/2018   Cough 29/51/8841   Acute systolic (congestive) heart failure ([place]) 12/10/2018   MRSA infection 05/23/2013   Acute cholecystitis 01/30/2012    Past Surgical History:  Procedure Laterality Date   CARDIAC SURGERY     CHOLECYSTECTOMY  01/31/2012   Procedure: LAPAROSCOPIC CHOLECYSTECTOMY;  Surgeon: Gayland Curry, MD,FACS;  Location: [place];  Service: General;  Laterality: N/A;   CORONARY ARTERY BYPASS  GRAFT N/A 12/21/2018   Procedure: CORONARY ARTERY BYPASS GRAFTING (CABG) x 3, USING LEFT INTERNAL MAMMARY ARTERY (LIMA) AND RIGHT GREATER SAPHENOUS VEIN (SVG) HARVESTED ENDOSCOPICALLY;    LIMA-LAD, SVG-RAMUS, SVG-DIAG;  Surgeon: Ivin Poot, MD;  Location: [place];  Service: Open Heart Surgery;  Laterality: N/A;   RIGHT/LEFT HEART CATH AND CORONARY ANGIOGRAPHY N/A 12/16/2018   Procedure: RIGHT/LEFT HEART CATH AND CORONARY ANGIOGRAPHY;  Surgeon: Jolaine Artist, MD;  Location: [place] [facility];  Service: Cardiovascular;  Laterality: N/A;   TEE WITHOUT CARDIOVERSION N/A 12/21/2018   Procedure: TRANSESOPHAGEAL ECHOCARDIOGRAM (TEE);  Surgeon: Prescott Gum, Collier Salina, MD;  Location: [place];  Service: Open Heart Surgery;  Laterality: N/A;       Home Medications    Prior to Admission medications   Medication Sig Start Date End Date Taking? Authorizing Provider  amiodarone (PACERONE) 200 MG tablet TAKE 1 TABLET (200 MG TOTAL) BY MOUTH DAILY. 06/20/20 06/20/21 Yes Bensimhon, Shaune Pascal, MD  aspirin EC 81 MG tablet Take 1 tablet (81 mg total) by mouth daily. 02/28/19  Yes Bensimhon, Shaune Pascal, MD  atorvastatin (LIPITOR) 80 MG tablet TAKE 1 TABLET (80 MG TOTAL) BY MOUTH DAILY AT 6 PM. 07/09/20 07/09/21 Yes Bensimhon, Shaune Pascal, MD  carvedilol (COREG) 3.125 MG tablet TAKE 1 TABLET (3.125 MG TOTAL) BY MOUTH 2 (TWO) TIMES DAILY. 06/20/20 06/20/21 Yes Bensimhon, Shaune Pascal, MD  losartan (COZAAR) 25 MG tablet TAKE 1 TABLET (25 MG TOTAL) BY MOUTH DAILY.  02/29/20 02/28/21 Yes Bensimhon, Shaune Pascal, MD  potassium chloride (KLOR-CON) 10 MEQ tablet TAKE 1 TABLET (10 MEQ TOTAL) BY MOUTH DAILY. 06/20/20 06/20/21 Yes Bensimhon, Shaune Pascal, MD  predniSONE (DELTASONE) 20 MG tablet Take 1 tablet (20 mg total) by mouth daily for 5 days. 10/11/20 10/16/20 Yes York Valliant, Myrene Galas, MD  promethazine-codeine [facility] WITH CODEINE) 6.25-10 MG/5ML syrup Take 5 mLs by mouth every 8 (eight) hours as needed for cough. 10/11/20  Yes Bemnet Trovato, Myrene Galas, MD   spironolactone (ALDACTONE) 25 MG tablet TAKE 1 TABLET (25 MG TOTAL) BY MOUTH DAILY. 06/20/20 06/20/21 Yes Bensimhon, Shaune Pascal, MD  levothyroxine (SYNTHROID) 25 MCG tablet Take 1 tablet (25 mcg total) by mouth daily before breakfast. Add'l refills need to come from pcp 01/03/20 04/02/20  Bensimhon, Shaune Pascal, MD    Family History Family History  Problem Relation Age of Onset   Parkinson's disease Mother     Social History Social History   Tobacco Use   Smoking status: Never   Smokeless tobacco: Never  Vaping Use   Vaping Use: Never used  Substance Use Topics   Alcohol use: Yes    Comment: occ   Drug use: No     Allergies   Patient has no known allergies.   Review of Systems Review of Systems  Respiratory:  Positive for cough. Negative for chest tightness, shortness of breath and wheezing.   Cardiovascular: Negative.  Negative for chest pain.  Gastrointestinal: Negative.   Neurological: Negative.     Physical Exam Triage Vital Signs ED Triage Vitals  Enc Vitals Group     BP 10/11/20 0917 116/78     Pulse Rate 10/11/20 0917 78     Resp 10/11/20 0917 20     Temp 10/11/20 0917 99 F (37.2 C)     Temp Source 10/11/20 0917 Oral     SpO2 10/11/20 0917 99 %     Weight --      Height --      Head Circumference --      Peak Flow --      Pain Score 10/11/20 0914 0     Pain Loc --      Pain Edu? --      Excl. in [place]? --    No data found.  Updated Vital Signs BP 116/78 (BP Location: Right Arm)   Pulse 78   Temp 99 F (37.2 C) (Oral)   Resp 20   SpO2 99%   Visual Acuity Right Eye Distance:   Left Eye Distance:   Bilateral Distance:    Right Eye Near:   Left Eye Near:    Bilateral Near:     Physical Exam Vitals and nursing note reviewed.  Constitutional:      General: He is not in acute distress.    Appearance: He is not ill-appearing.  HENT:     Right Ear: Tympanic membrane normal.     Left Ear: Tympanic membrane normal.  Cardiovascular:     Rate and  Rhythm: Normal rate and regular rhythm.     Pulses: Normal pulses.     Heart sounds: Normal heart sounds.  Pulmonary:     Effort: Pulmonary effort is normal. No respiratory distress.     Breath sounds: Normal breath sounds. No stridor. No wheezing, rhonchi or rales.  Neurological:     Mental Status: He is alert.     UC Treatments / Results  Labs (all labs ordered are listed, but only abnormal  results are displayed) Labs Reviewed - No data to display  EKG   Radiology No results found.  Procedures Procedures (including critical care time)  Medications Ordered in UC Medications - No data to display  Initial Impression / Assessment and Plan / UC Course  I have reviewed the triage vital signs and the nursing notes.  Pertinent labs & imaging results that were available during my care of the patient were reviewed by me and considered in my medical decision making (see chart for details).     1.  Postviral cough syndrome: Prednisone 20 mg orally daily for 5 days Promethazine with codeine 6.25 mg-10 mg / 5 mL, 5 mL every 8 hours as needed for cough Lung exam is unremarkable No indication for x-rays Return to urgent care if symptoms worsen. Final Clinical Impressions(s) / UC Diagnoses   Final diagnoses:  Post-viral cough syndrome     Discharge Instructions      Medications as prescribed If symptoms worsen please return to urgent care to be reevaluated.      ED Prescriptions     Medication Sig Dispense Auth. Provider   predniSONE (DELTASONE) 20 MG tablet Take 1 tablet (20 mg total) by mouth daily for 5 days. 5 tablet Shanay Woolman, Myrene Galas, MD   HYDROcodone bit-homatropine (HYCODAN) 5-1.5 MG/5ML syrup Take 5 mLs by mouth every 6 (six) hours as needed for cough. 120 mL Antone Summons, Myrene Galas, MD   promethazine-codeine [facility] WITH CODEINE) 6.25-10 MG/5ML syrup Take 5 mLs by mouth every 8 (eight) hours as needed for cough. 180 mL Icelyn Navarrete, Myrene Galas, MD      PDMP not  reviewed this encounter.   Chase Picket, MD 10/11/20 1850    Chase Picket, MD 10/11/20 [phone]

## 2020-10-11 NOTE — Discharge Instructions (Addendum)
Medications as prescribed If symptoms worsen please return to urgent care to be reevaluated.

## 2020-10-19 ENCOUNTER — Other Ambulatory Visit (HOSPITAL_COMMUNITY): Payer: Self-pay | Admitting: Internal Medicine

## 2020-10-19 ENCOUNTER — Other Ambulatory Visit: Payer: Self-pay

## 2020-10-19 MED ORDER — AMIODARONE HCL 200 MG PO TABS
ORAL_TABLET | Freq: Every day | ORAL | 0 refills | Status: DC
  Filled 2020-10-19: qty 30, 30d supply, fill #0
  Filled 2020-11-19: qty 30, 30d supply, fill #1
  Filled 2020-12-17: qty 30, 30d supply, fill #2

## 2020-10-19 MED ORDER — CARVEDILOL 3.125 MG PO TABS
ORAL_TABLET | Freq: Two times a day (BID) | ORAL | 0 refills | Status: DC
Start: 2020-10-19 — End: 2020-12-17
  Filled 2020-10-19: qty 60, 30d supply, fill #0
  Filled 2020-11-19: qty 60, 30d supply, fill #1

## 2020-10-19 MED ORDER — SPIRONOLACTONE 25 MG PO TABS
ORAL_TABLET | Freq: Every day | ORAL | 0 refills | Status: DC
  Filled 2020-10-19: qty 30, 30d supply, fill #0
  Filled 2020-11-19: qty 30, 30d supply, fill #1
  Filled 2020-12-17: qty 30, 30d supply, fill #2

## 2020-10-19 MED ORDER — POTASSIUM CHLORIDE ER 10 MEQ PO TBCR
EXTENDED_RELEASE_TABLET | Freq: Every day | ORAL | 0 refills | Status: DC
  Filled 2020-10-19: qty 30, 30d supply, fill #0

## 2020-10-23 ENCOUNTER — Other Ambulatory Visit: Payer: Self-pay

## 2020-11-10 MED FILL — Atorvastatin Calcium Tab 80 MG (Base Equivalent): ORAL | 30 days supply | Qty: 30 | Fill #3 | Status: AC

## 2020-11-12 ENCOUNTER — Other Ambulatory Visit: Payer: Self-pay

## 2020-11-19 ENCOUNTER — Other Ambulatory Visit: Payer: Self-pay

## 2020-11-19 ENCOUNTER — Other Ambulatory Visit (HOSPITAL_COMMUNITY): Payer: Self-pay | Admitting: Internal Medicine

## 2020-11-19 MED ORDER — POTASSIUM CHLORIDE ER 10 MEQ PO TBCR
EXTENDED_RELEASE_TABLET | Freq: Every day | ORAL | 0 refills | Status: DC
  Filled 2020-11-19: qty 30, 30d supply, fill #0
  Filled 2020-12-17: qty 30, 30d supply, fill #1
  Filled 2021-01-14: qty 30, 30d supply, fill #2

## 2020-11-21 ENCOUNTER — Other Ambulatory Visit: Payer: Self-pay

## 2020-12-10 ENCOUNTER — Other Ambulatory Visit (HOSPITAL_COMMUNITY): Payer: Self-pay | Admitting: Internal Medicine

## 2020-12-11 ENCOUNTER — Other Ambulatory Visit: Payer: Self-pay

## 2020-12-11 MED ORDER — ATORVASTATIN CALCIUM 80 MG PO TABS
80.0000 mg | ORAL_TABLET | Freq: Every day | ORAL | 2 refills | Status: DC
  Filled 2020-12-11: qty 30, 30d supply, fill #0
  Filled 2021-01-14: qty 30, 30d supply, fill #1
  Filled 2021-02-18: qty 30, 30d supply, fill #2

## 2020-12-12 ENCOUNTER — Other Ambulatory Visit: Payer: Self-pay

## 2020-12-17 ENCOUNTER — Other Ambulatory Visit (HOSPITAL_COMMUNITY): Payer: Self-pay | Admitting: Internal Medicine

## 2020-12-17 ENCOUNTER — Other Ambulatory Visit: Payer: Self-pay

## 2020-12-18 ENCOUNTER — Other Ambulatory Visit: Payer: Self-pay

## 2020-12-18 MED ORDER — CARVEDILOL 3.125 MG PO TABS
3.1250 mg | ORAL_TABLET | Freq: Two times a day (BID) | ORAL | 0 refills | Status: DC
  Filled 2020-12-18: qty 180, 90d supply, fill #0

## 2020-12-19 ENCOUNTER — Other Ambulatory Visit: Payer: Self-pay

## 2021-01-14 ENCOUNTER — Other Ambulatory Visit: Payer: Self-pay

## 2021-01-14 ENCOUNTER — Other Ambulatory Visit (HOSPITAL_COMMUNITY): Payer: Self-pay | Admitting: Internal Medicine

## 2021-01-16 ENCOUNTER — Other Ambulatory Visit: Payer: Self-pay

## 2021-01-17 ENCOUNTER — Other Ambulatory Visit: Payer: Self-pay

## 2021-01-17 MED ORDER — AMIODARONE HCL 200 MG PO TABS
ORAL_TABLET | Freq: Every day | ORAL | 0 refills | Status: DC
  Filled 2021-01-17: qty 30, 30d supply, fill #0
  Filled 2021-02-18: qty 30, 30d supply, fill #1
  Filled 2021-03-18: qty 30, 30d supply, fill #2

## 2021-01-17 MED ORDER — SPIRONOLACTONE 25 MG PO TABS
ORAL_TABLET | Freq: Every day | ORAL | 0 refills | Status: DC
  Filled 2021-01-17: qty 30, 30d supply, fill #0
  Filled 2021-02-18: qty 30, 30d supply, fill #1
  Filled 2021-03-18: qty 30, 30d supply, fill #2

## 2021-01-18 ENCOUNTER — Other Ambulatory Visit: Payer: Self-pay

## 2021-02-18 ENCOUNTER — Other Ambulatory Visit (HOSPITAL_COMMUNITY): Payer: Self-pay | Admitting: Internal Medicine

## 2021-02-18 ENCOUNTER — Other Ambulatory Visit: Payer: Self-pay

## 2021-02-20 ENCOUNTER — Other Ambulatory Visit: Payer: Self-pay

## 2021-02-20 MED ORDER — POTASSIUM CHLORIDE ER 10 MEQ PO TBCR
10.0000 meq | EXTENDED_RELEASE_TABLET | Freq: Every day | ORAL | 0 refills | Status: DC
  Filled 2021-02-20: qty 30, 30d supply, fill #0
  Filled 2021-03-18: qty 30, 30d supply, fill #1
  Filled 2021-04-19: qty 30, 30d supply, fill #2

## 2021-03-18 ENCOUNTER — Other Ambulatory Visit: Payer: Self-pay

## 2021-03-18 ENCOUNTER — Other Ambulatory Visit (HOSPITAL_COMMUNITY): Payer: Self-pay | Admitting: Internal Medicine

## 2021-03-18 MED ORDER — CARVEDILOL 3.125 MG PO TABS
3.1250 mg | ORAL_TABLET | Freq: Two times a day (BID) | ORAL | 0 refills | Status: DC
  Filled 2021-03-18: qty 60, 30d supply, fill #0

## 2021-03-18 MED ORDER — ATORVASTATIN CALCIUM 80 MG PO TABS
80.0000 mg | ORAL_TABLET | Freq: Every day | ORAL | 0 refills | Status: DC
  Filled 2021-03-18: qty 30, 30d supply, fill #0

## 2021-04-19 ENCOUNTER — Other Ambulatory Visit (HOSPITAL_COMMUNITY): Payer: Self-pay | Admitting: Internal Medicine

## 2021-04-19 ENCOUNTER — Other Ambulatory Visit: Payer: Self-pay

## 2021-04-19 MED ORDER — AMIODARONE HCL 200 MG PO TABS
ORAL_TABLET | Freq: Every day | ORAL | 3 refills | Status: DC
  Filled 2021-04-19: qty 30, 30d supply, fill #0

## 2021-04-19 MED ORDER — CARVEDILOL 3.125 MG PO TABS
3.1250 mg | ORAL_TABLET | Freq: Two times a day (BID) | ORAL | 6 refills | Status: DC
  Filled 2021-04-19 – 2021-05-20 (×2): qty 60, 30d supply, fill #0
  Filled 2021-05-20 – 2021-06-17 (×2): qty 60, 30d supply, fill #1
  Filled 2021-07-22: qty 60, 30d supply, fill #2

## 2021-04-19 MED ORDER — SPIRONOLACTONE 25 MG PO TABS
ORAL_TABLET | Freq: Every day | ORAL | 3 refills | Status: DC
  Filled 2021-04-19 – 2021-05-20 (×2): qty 30, 30d supply, fill #0
  Filled 2021-05-20 – 2021-06-17 (×2): qty 30, 30d supply, fill #1
  Filled 2021-07-22: qty 30, 30d supply, fill #2

## 2021-04-19 MED ORDER — ATORVASTATIN CALCIUM 80 MG PO TABS
80.0000 mg | ORAL_TABLET | Freq: Every day | ORAL | 6 refills | Status: DC
  Filled 2021-04-19: qty 30, 30d supply, fill #0
  Filled 2021-05-23: qty 30, 30d supply, fill #1
  Filled 2021-05-23: qty 30, 30d supply, fill #0
  Filled 2021-06-25: qty 30, 30d supply, fill #1

## 2021-04-23 ENCOUNTER — Other Ambulatory Visit: Payer: Self-pay

## 2021-04-24 ENCOUNTER — Other Ambulatory Visit: Payer: Self-pay

## 2021-05-01 NOTE — Progress Notes (Incomplete)
ADVANCED HF CLINIC NOTE  Primary HF Cardiologist: Dr Haroldine Laws CT Surgery: Dr Prescott Gum PCP: Dr. Charlott Rakes  HPI: 56 year old patient with a history of cholecystectomy in 2013, CAD, S/P CABG, left lung nodule.   Admitted with increase shortness of breath. Echo 12/11/18 LVEF 15-20% with moderate RV dysfunction .Chest CT 8/17  with suggestion of severe CAD with totally occluded LAD and high-grade ramus lesion. Also LLL nodule suggestive of lung CA (versus infection). He underwent CABG x3 on 12/21/18. Post operative course was uneventful. He was discharged to home with [facility] on 12/30/2018.   Had follow up with CT surgery. Plan for CT scan of chest to reassess possible mass in a couple of months. Amio was stopped at that visit.   CT chest 11/20 resolution of infiltrate/nodule  Today he returns for HF follow up. Overall feeling very good. Weight stable at 185. Denies CP, SOB, orthopnea, edema or PND. Working at [employer]. Feels lightheaded if he gets up too quickly, subsides quickly. Weights stable. Working out on [place] 3x/week for about 15 minutes at a time.  Echo 2/21: 30-35%, RV mildly reduced. Echo 11/2018: EF 15%. RV moderately reduced.    RHC/LHC 12/16/18 RIGHT/LEFT HEART CATH AND CORONARY ANGIOGRAPHY  Conclusion     RPDA lesion is 70% stenosed. Ost LAD to Prox LAD lesion is 99% stenosed. Prox LAD to Mid LAD lesion is 100% stenosed. 1st Diag lesion is 99% stenosed. Ramus lesion is 90% stenosed.   Findings:   Ao = 88/51 (68) LV = 86/17 RA = 3 RV = 46/5 PA = 45/10 (23) PCW = 17 Fick cardiac output/index = 4.2/2.0 PVR = 1.5 WU Ao sat = 98% PA sat = 67%, 68%   Assessment: 1. 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 Well-compensated filling pressures with moderately reduced CO   ROS: All systems negative except as listed in HPI, PMH and Problem List.  SH:  Social History   Socioeconomic History   Marital  status: Divorced    Spouse name: Not on file   Number of children: Not on file   Years of education: Not on file   Highest education level: Not on file  Occupational History   Not on file  Tobacco Use   Smoking status: Never   Smokeless tobacco: Never  Vaping Use   Vaping Use: Never used  Substance and Sexual Activity   Alcohol use: Yes    Comment: occ   Drug use: No   Sexual activity: Not on file  Other Topics Concern   Not on file  Social History Narrative   Not on file   Social Determinants of Health   Financial Resource Strain: Not on file  Food Insecurity: Not on file  Transportation Needs: Not on file  Physical Activity: Not on file  Stress: Not on file  Social Connections: Not on file  Intimate Partner Violence: Not on file    FH:  Family History  Problem Relation Age of Onset   Parkinson's disease Mother     Past Medical History:  Diagnosis Date   Back pain    CHF (congestive heart failure) (HCC)     Current Outpatient Medications  Medication Sig Dispense Refill   amiodarone (PACERONE) 200 MG tablet Take 1 tablet by mouth daily. 90 tablet 3   aspirin EC 81 MG tablet Take 1 tablet (81 mg total) by mouth daily. 30 tablet 3  atorvastatin (LIPITOR) 80 MG tablet Take 1 tablet (80 mg total) by mouth daily. 30 tablet 6   carvedilol (COREG) 3.125 MG tablet Take 1 tablet (3.125 mg total) by mouth 2 (two) times daily. 60 tablet 6   losartan (COZAAR) 25 MG tablet TAKE 1 TABLET (25 MG TOTAL) BY MOUTH DAILY. 30 tablet 3   potassium chloride (KLOR-CON) 10 MEQ tablet Take 1 tablet (10 mEq total) by mouth daily. Needs appt for further refills 90 tablet 0   promethazine-codeine (PHENERGAN WITH CODEINE) 6.25-10 MG/5ML syrup Take 5 mLs by mouth every 8 (eight) hours as needed for cough. 180 mL 0   spironolactone (ALDACTONE) 25 MG tablet Take 1 tablet by mouth daily. 90 tablet 3   No current facility-administered medications for this visit.    There were no vitals  filed for this visit.  Wt Readings from Last 3 Encounters:  04/02/20 89.5 kg (197 lb 6.4 oz)  06/01/19 89.1 kg (196 lb 6.4 oz)  04/12/19 87.5 kg (193 lb)    PHYSICAL EXAM: General:  Well appearing. No resp difficulty HEENT: normal Neck: supple. no JVD. Carotids 2+ bilat; no bruits. No lymphadenopathy or thryomegaly appreciated. Cor: PMI nondisplaced, distant. Regular rate & rhythm. No rubs, gallops or murmurs. Lungs: clear, diminished in bases Abdomen: soft, nontender, nondistended. No hepatosplenomegaly. No bruits or masses. Good bowel sounds. Extremities: no cyanosis, clubbing, rash, edema Neuro: alert & oriented x 3, cranial nerves grossly intact. moves all 4 extremities w/o difficulty. Affect pleasant  ECG: NSR 73 bpm, RBBB with occasional monomorphic PVCs (Personally reviewed).  ASSESSMENT & PLAN:  1. CAD s/p CABG x3 on 12/21/18 - Continue ASA and statin - Doing well. No s/s angina - Continue ASA, statin & b-blocker  2. Post Operative Atrial Fibrillation:  - In NSR today  - Continue amio for PVC   3. Chronic Systolic CHF, ICM : - Echo 8/20 EF 20-25%  - Echo 11/20 EF 20-25% - Echo 2/21 EF 30-35% - NYHA II. Volume status stable. He does not need lasix.  - Continue losartan 25 mg qhs. (BP too low to switch to [facility] or titrate upward with orthostasis) - Continue 3.125 mg carvedilol bid  - Continue spiro mg daily - No insurance currently, unable to start [place] today - repeat ECHO, if EF<35%, will need to discuss ICD and EF referral  4. High Burden PVCs:  ~ 20% burden initially. Suspect likely contributing to cardiomyopathy.  - suppressed on amio, suppressed on ECG today - Amio stopped 01/26/19. PVCs restarted 11/20 - Zio 2/21 no AF, mostly in SR w/ 1st degree HB, some ventricular bigeminy and trigeminy present - Continue Amio 200 daily  - CMET & TSH today, needs regular eye exam   Rafael Bihari MD 10:35 PM

## 2021-05-02 ENCOUNTER — Encounter (HOSPITAL_COMMUNITY): Payer: Self-pay

## 2021-05-15 NOTE — Progress Notes (Signed)
ADVANCED HF CLINIC NOTE  Primary HF Cardiologist: Dr Haroldine Laws CT Surgery: Dr Prescott Gum PCP: Dr. Charlott Rakes  HPI: 56 year old King with a history of cholecystectomy in 2013, CAD, S/P CABG, left lung nodule, chronic systolic CHF/ICM, PVCs on amiodarone.   Admitted with increase shortness of breath. Echo 12/11/18 LVEF 15-20% with moderate RV dysfunction .Chest CT 8/17  with suggestion of severe CAD with totally occluded LAD and high-grade ramus lesion. Also LLL nodule suggestive of lung CA (versus infection). He underwent CABG x3 on 12/21/18. Post operative course was uneventful. He was discharged to home with [facility] on 12/30/2018.   Had follow up with CT surgery. Plan for CT scan of chest to reassess possible mass in a couple of months. Amio was stopped at that visit.   CT chest 11/20 resolution of infiltrate/nodule  Last seen 12/21. Volume stable. Not requiring diuretic. TSH elevated in setting of amiodarone use. Instructed to start Levothyroxine but did not take the medication.  He is here today for HF f/u. Reports taking medications as prescribed. Works 4 hrs a week at an [employer]. No complaints of CP, dyspnea, orthopnea, PND or leg edema. He's had chronic runny nose/nasal congestion with cough for about a year. Seen in ED twice for symptoms in 2022. No improvement with cough suppressants and coarse of steroids for suspected viral illness.    Cardiac Studies: Echo 01/22: EF 45-50%, RV okay, mild to moderate MR. Echo 2/21: 30-Jonathan%, RV mildly reduced. Echo 11/2018: EF 15%. RV moderately reduced.    RHC/LHC 12/16/18 RIGHT/LEFT HEART CATH AND CORONARY ANGIOGRAPHY  Conclusion     RPDA lesion is 70% stenosed. Ost LAD to Prox LAD lesion is 99% stenosed. Prox LAD to Mid LAD lesion is 100% stenosed. 1st Diag lesion is 99% stenosed. Ramus lesion is 90% stenosed.   Findings:   Ao = 88/51 (68) LV = 86/17 RA = 3 RV = 46/5 PA = 45/10 (23) PCW = 17 Fick cardiac output/index =  4.2/2.0 PVR = 1.5 WU Ao sat = 98% PA sat = 67%, 68%   Assessment: 1. 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 Well-compensated filling pressures with moderately reduced CO   ROS: All systems negative except as listed in HPI, PMH and Problem List.  SH:  Social History   Socioeconomic History   Marital status: Divorced    Spouse name: Not on file   Number of children: Not on file   Years of education: Not on file   Highest education level: Not on file  Occupational History   Not on file  Tobacco Use   Smoking status: Never   Smokeless tobacco: Never  Vaping Use   Vaping Use: Never used  Substance and Sexual Activity   Alcohol use: Yes    Comment: occ   Drug use: No   Sexual activity: Not on file  Other Topics Concern   Not on file  Social History Narrative   Not on file   Social Determinants of Health   Financial Resource Strain: Not on file  Food Insecurity: Not on file  Transportation Needs: Not on file  Physical Activity: Not on file  Stress: Not on file  Social Connections: Not on file  Intimate Partner Violence: Not on file    FH:  Family History  Problem Relation Age of Onset   Parkinson's disease Mother     Past Medical History:  Diagnosis  Date   Back pain    CHF (congestive heart failure) (HCC)     Current Outpatient Medications  Medication Sig Dispense Refill   amiodarone (PACERONE) 200 MG tablet Take 1 tablet by mouth daily. 90 tablet 3   aspirin EC 81 MG tablet Take 1 tablet (81 mg total) by mouth daily. 30 tablet 3   atorvastatin (LIPITOR) 80 MG tablet Take 1 tablet (80 mg total) by mouth daily. 30 tablet 6   carvedilol (COREG) 3.125 MG tablet Take 1 tablet (3.125 mg total) by mouth 2 (two) times daily. 60 tablet 6   potassium chloride (KLOR-CON) 10 MEQ tablet Take 1 tablet (10 mEq total) by mouth daily. Needs appt for further refills 90 tablet 0   sacubitril-valsartan (ENTRESTO)  24-26 MG Take 1 tablet by mouth 2 (two) times daily. 60 tablet 6   spironolactone (ALDACTONE) 25 MG tablet Take 1 tablet by mouth daily. 90 tablet 3   No current facility-administered medications for this encounter.    Vitals:   05/16/21 1525  BP: 120/90  Pulse: 96  SpO2: 91%  Weight: 89.8 kg    Wt Readings from Last 3 Encounters:  05/16/21 89.8 kg  04/02/20 89.5 kg  06/01/19 89.1 kg    PHYSICAL EXAM: General:  No distress. Ambulated into clinic. HEENT: normal Neck: supple. no JVD. Carotids 2+ bilat; no bruits. No lymphadenopathy or thryomegaly appreciated. Cor: PMI nondisplaced. Regular rate & rhythm. No rubs, gallops or murmurs. Lungs: clear Abdomen: soft, nontender, nondistended. No hepatosplenomegaly. No bruits or masses. Good bowel sounds. Extremities: no cyanosis, clubbing, rash, edema Neuro: alert & orientedx3, cranial nerves grossly intact. moves all 4 extremities w/o difficulty. Affect pleasant   ECG: SR 99 bpm, RBBB  ASSESSMENT & PLAN:  1. CAD  - s/p CABG x3 on 12/21/18 - Continue ASA and statin - No s/s angina - Continue ASA, statin & b-blocker  2. Post Operative Atrial Fibrillation:  - In NSR today  - Continue amio for PVC   3. Chronic Systolic CHF, ICM : - Echo 8/20 EF 20-25%  - Echo 11/20 EF 20-25% - Echo 2/21 EF 30-Jonathan% - Echo 01/22 EF 45-50%, RV okay, mild to moderate MR - NYHA II. Volume status stable. He does not need lasix.  - Switch losartan to entresto 24/26 mg BID. Given 30 day free card. Pharmacy team to assist with [place] assistance application. No insurance. - Continue 3.125 mg carvedilol bid  - Continue 25 spiro mg daily - Eventually Farxiga - CMET today  4. High Burden PVCs:  ~ 20% burden initially. Suspect likely contributing to cardiomyopathy.  - suppressed on amio - Amio stopped 01/26/19. PVCs restarted 11/20 - Zio 2/21 no AF, mostly in SR w/ 1st degree HB, some ventricular bigeminy and trigeminy present - Continue Amio 200 mg  daily for now - will discuss duration of amiodarone use with Dr. Haroldine Laws given his young age. - CMET & TSH today. Suspect TSH will be elevated, high in 03/2020 and did not start Levothyroxine as recommended.   5. Cough/nasal congestion: -F/u with PCP -Has not been seen since 2020   Referred to HF CSW to assist with obtaining insurance. Currently has family planning Medicaid.   Follow-up: 6 months with Dr. Haroldine Laws   Noriel Guthrie N PA-C 6:10 PM

## 2021-05-16 ENCOUNTER — Ambulatory Visit (HOSPITAL_COMMUNITY)
Admission: RE | Admit: 2021-05-16 | Discharge: 2021-05-16 | Disposition: A | Discharge status: Home / Self Care | Payer: Self-pay | Source: Ambulatory Visit | Attending: Physician Assistant | Admitting: Physician Assistant

## 2021-05-16 ENCOUNTER — Other Ambulatory Visit: Payer: Self-pay

## 2021-05-16 ENCOUNTER — Encounter (HOSPITAL_COMMUNITY): Payer: Self-pay

## 2021-05-16 VITALS — BP 120/90 | HR 96 | Wt 198.0 lb

## 2021-05-16 DIAGNOSIS — R059 Cough, unspecified: Secondary | ICD-10-CM | POA: Insufficient documentation

## 2021-05-16 DIAGNOSIS — I255 Ischemic cardiomyopathy: Secondary | ICD-10-CM | POA: Insufficient documentation

## 2021-05-16 DIAGNOSIS — I4510 Unspecified right bundle-branch block: Secondary | ICD-10-CM | POA: Insufficient documentation

## 2021-05-16 DIAGNOSIS — I452 Bifascicular block: Secondary | ICD-10-CM | POA: Insufficient documentation

## 2021-05-16 DIAGNOSIS — Z951 Presence of aortocoronary bypass graft: Secondary | ICD-10-CM | POA: Insufficient documentation

## 2021-05-16 DIAGNOSIS — I2510 Atherosclerotic heart disease of native coronary artery without angina pectoris: Secondary | ICD-10-CM | POA: Insufficient documentation

## 2021-05-16 DIAGNOSIS — I5022 Chronic systolic (congestive) heart failure: Secondary | ICD-10-CM | POA: Insufficient documentation

## 2021-05-16 DIAGNOSIS — I9789 Other postprocedural complications and disorders of the circulatory system, not elsewhere classified: Secondary | ICD-10-CM | POA: Insufficient documentation

## 2021-05-16 DIAGNOSIS — I444 Left anterior fascicular block: Secondary | ICD-10-CM | POA: Insufficient documentation

## 2021-05-16 DIAGNOSIS — R911 Solitary pulmonary nodule: Secondary | ICD-10-CM | POA: Insufficient documentation

## 2021-05-16 DIAGNOSIS — I493 Ventricular premature depolarization: Secondary | ICD-10-CM

## 2021-05-16 DIAGNOSIS — Z9049 Acquired absence of other specified parts of digestive tract: Secondary | ICD-10-CM | POA: Insufficient documentation

## 2021-05-16 DIAGNOSIS — R7989 Other specified abnormal findings of blood chemistry: Secondary | ICD-10-CM | POA: Insufficient documentation

## 2021-05-16 DIAGNOSIS — R0981 Nasal congestion: Secondary | ICD-10-CM | POA: Insufficient documentation

## 2021-05-16 DIAGNOSIS — Z79899 Other long term (current) drug therapy: Secondary | ICD-10-CM | POA: Insufficient documentation

## 2021-05-16 DIAGNOSIS — R946 Abnormal results of thyroid function studies: Secondary | ICD-10-CM | POA: Insufficient documentation

## 2021-05-16 DIAGNOSIS — Z7982 Long term (current) use of aspirin: Secondary | ICD-10-CM | POA: Insufficient documentation

## 2021-05-16 DIAGNOSIS — I4891 Unspecified atrial fibrillation: Secondary | ICD-10-CM

## 2021-05-16 LAB — COMPREHENSIVE METABOLIC PANEL
ALT: 93 U/L — ABNORMAL HIGH (ref 0–44)
AST: 80 U/L — ABNORMAL HIGH (ref 15–41)
Albumin: 3.7 g/dL (ref 3.5–5.0)
Alkaline Phosphatase: 92 U/L (ref 38–126)
Anion gap: 14 (ref 5–15)
BUN: 19 mg/dL (ref 6–20)
CO2: 23 mmol/L (ref 22–32)
Calcium: 9.4 mg/dL (ref 8.9–10.3)
Chloride: 102 mmol/L (ref 98–111)
Creatinine, Ser: 0.98 mg/dL (ref 0.61–1.24)
GFR, Estimated: >60 mL/min (ref >60)
Glucose, Bld: 123 mg/dL — ABNORMAL HIGH (ref 70–99)
Potassium: 3.7 mmol/L (ref 3.5–5.1)
Sodium: 139 mmol/L (ref 135–145)
Total Bilirubin: 0.7 mg/dL (ref 0.3–1.2)
Total Protein: 7.5 g/dL (ref 6.5–8.1)

## 2021-05-16 LAB — TSH: TSH: 11.38 u[IU]/mL — ABNORMAL HIGH (ref 0.350–4.500)

## 2021-05-16 LAB — T4, FREE: Free T4: 0.6 ng/dL — ABNORMAL LOW (ref 0.61–1.12)

## 2021-05-16 MED ORDER — ENTRESTO 24-26 MG PO TABS
1.0000 | ORAL_TABLET | Freq: Two times a day (BID) | ORAL | 6 refills | Status: DC
  Filled 2021-05-16: qty 60, 30d supply, fill #0

## 2021-05-16 NOTE — Patient Instructions (Signed)
STOP Losartan  START Entresto 24/26 mg, one tab twice a day  Labs today We will only contact you if something comes back abnormal or we need to make some changes. Otherwise no news is good news!  Your physician recommends that you schedule a follow-up appointment in: 6 months with Dr Haroldine Laws  Do the following things EVERYDAY: Weigh yourself in the morning before breakfast. Write it down and keep it in a log. Take your medicines as prescribed Eat low salt foods--Limit salt (sodium) to 2000 mg per day.  Stay as active as you can everyday Limit all fluids for the day to less than 2 liters

## 2021-05-17 ENCOUNTER — Telehealth (HOSPITAL_COMMUNITY): Payer: Self-pay | Admitting: *Deleted

## 2021-05-17 ENCOUNTER — Other Ambulatory Visit: Payer: Self-pay

## 2021-05-17 DIAGNOSIS — R7989 Other specified abnormal findings of blood chemistry: Secondary | ICD-10-CM

## 2021-05-17 MED ORDER — LEVOTHYROXINE SODIUM 50 MCG PO TABS
50.0000 ug | ORAL_TABLET | Freq: Every day | ORAL | 2 refills | Status: DC
  Filled 2021-05-17: qty 30, 30d supply, fill #0
  Filled 2021-06-17: qty 30, 30d supply, fill #1
  Filled 2021-07-22: qty 30, 30d supply, fill #2

## 2021-05-17 NOTE — Telephone Encounter (Signed)
Per Marlyce Huge, PA: TSH elevated, stop Amio, Start Levothyroxine 50 mcg daily, check lfts, tsh in 6 weeks, f/u with Dr Haroldine Laws in 4 months   I called and spoke w/pt, he is aware, agreeable, and verbalized understanding. New rx sent in, appts sch and appt reminder cards mailed to pt

## 2021-05-20 ENCOUNTER — Other Ambulatory Visit (HOSPITAL_COMMUNITY): Payer: Self-pay | Admitting: Internal Medicine

## 2021-05-20 ENCOUNTER — Other Ambulatory Visit: Payer: Self-pay

## 2021-05-20 MED ORDER — POTASSIUM CHLORIDE ER 10 MEQ PO TBCR
10.0000 meq | EXTENDED_RELEASE_TABLET | Freq: Every day | ORAL | 0 refills | Status: DC
  Filled 2021-05-20: qty 30, 30d supply, fill #0
  Filled 2021-05-20: qty 90, 90d supply, fill #0
  Filled 2021-05-23: qty 30, 30d supply, fill #0
  Filled 2021-06-17: qty 30, 30d supply, fill #1
  Filled 2021-07-22: qty 30, 30d supply, fill #2

## 2021-05-23 ENCOUNTER — Other Ambulatory Visit: Payer: Self-pay

## 2021-05-24 ENCOUNTER — Telehealth (HOSPITAL_COMMUNITY): Payer: Self-pay | Admitting: Licensed Clinical Social Worker

## 2021-05-24 NOTE — Telephone Encounter (Signed)
CSW consulted to speak with pt about current lack of insurance.  Attempted to call- left VM requesting return call.  Jorge Ny, LCSW Clinical Social Worker [facility] Desk#: [phone] Cell#: [phone]

## 2021-05-24 NOTE — Telephone Encounter (Signed)
Pt returned call- Pt reports he has not had insurance in some time.  Works part time so unable to get insurance through his employer.  Has family planning Medicaid (which only covers birth control methods) from when he applied for Medicaid years ago- heart has improved since then.  CSW discussed CAFA as way to assist with [place] bills- pt would like to attempt applying- will send application to his house.  Also discussed ACA insurance but not open enrollment and pt does not seem to meet special enrollment period criteria so suggested he look into that in the fall  Will continue to follow and assist as needed  Jorge Ny, [facility] Desk#: [phone] Cell#: [phone]

## 2021-05-27 ENCOUNTER — Other Ambulatory Visit: Payer: Self-pay

## 2021-05-31 ENCOUNTER — Other Ambulatory Visit (HOSPITAL_COMMUNITY): Payer: Self-pay

## 2021-05-31 MED ORDER — ENTRESTO 24-26 MG PO TABS: 1.0000 | ORAL_TABLET | Freq: Two times a day (BID) | ORAL | 3 refills | Status: DC

## 2021-06-07 ENCOUNTER — Telehealth (HOSPITAL_COMMUNITY): Payer: Self-pay | Admitting: Pharmacy Technician

## 2021-06-07 NOTE — Telephone Encounter (Signed)
Advanced Heart Failure Patient Advocate Encounter  Sent in [employer] application via fax.  Will follow up.  

## 2021-06-14 IMAGING — DX PORTABLE CHEST - 1 VIEW
1 series · 1 of 1 positions shown · non-contrast
Comparison: 12/21/2018

CLINICAL DATA: Postoperative CABG, CHF

EXAM:
PORTABLE CHEST 1 VIEW

[chest ap]
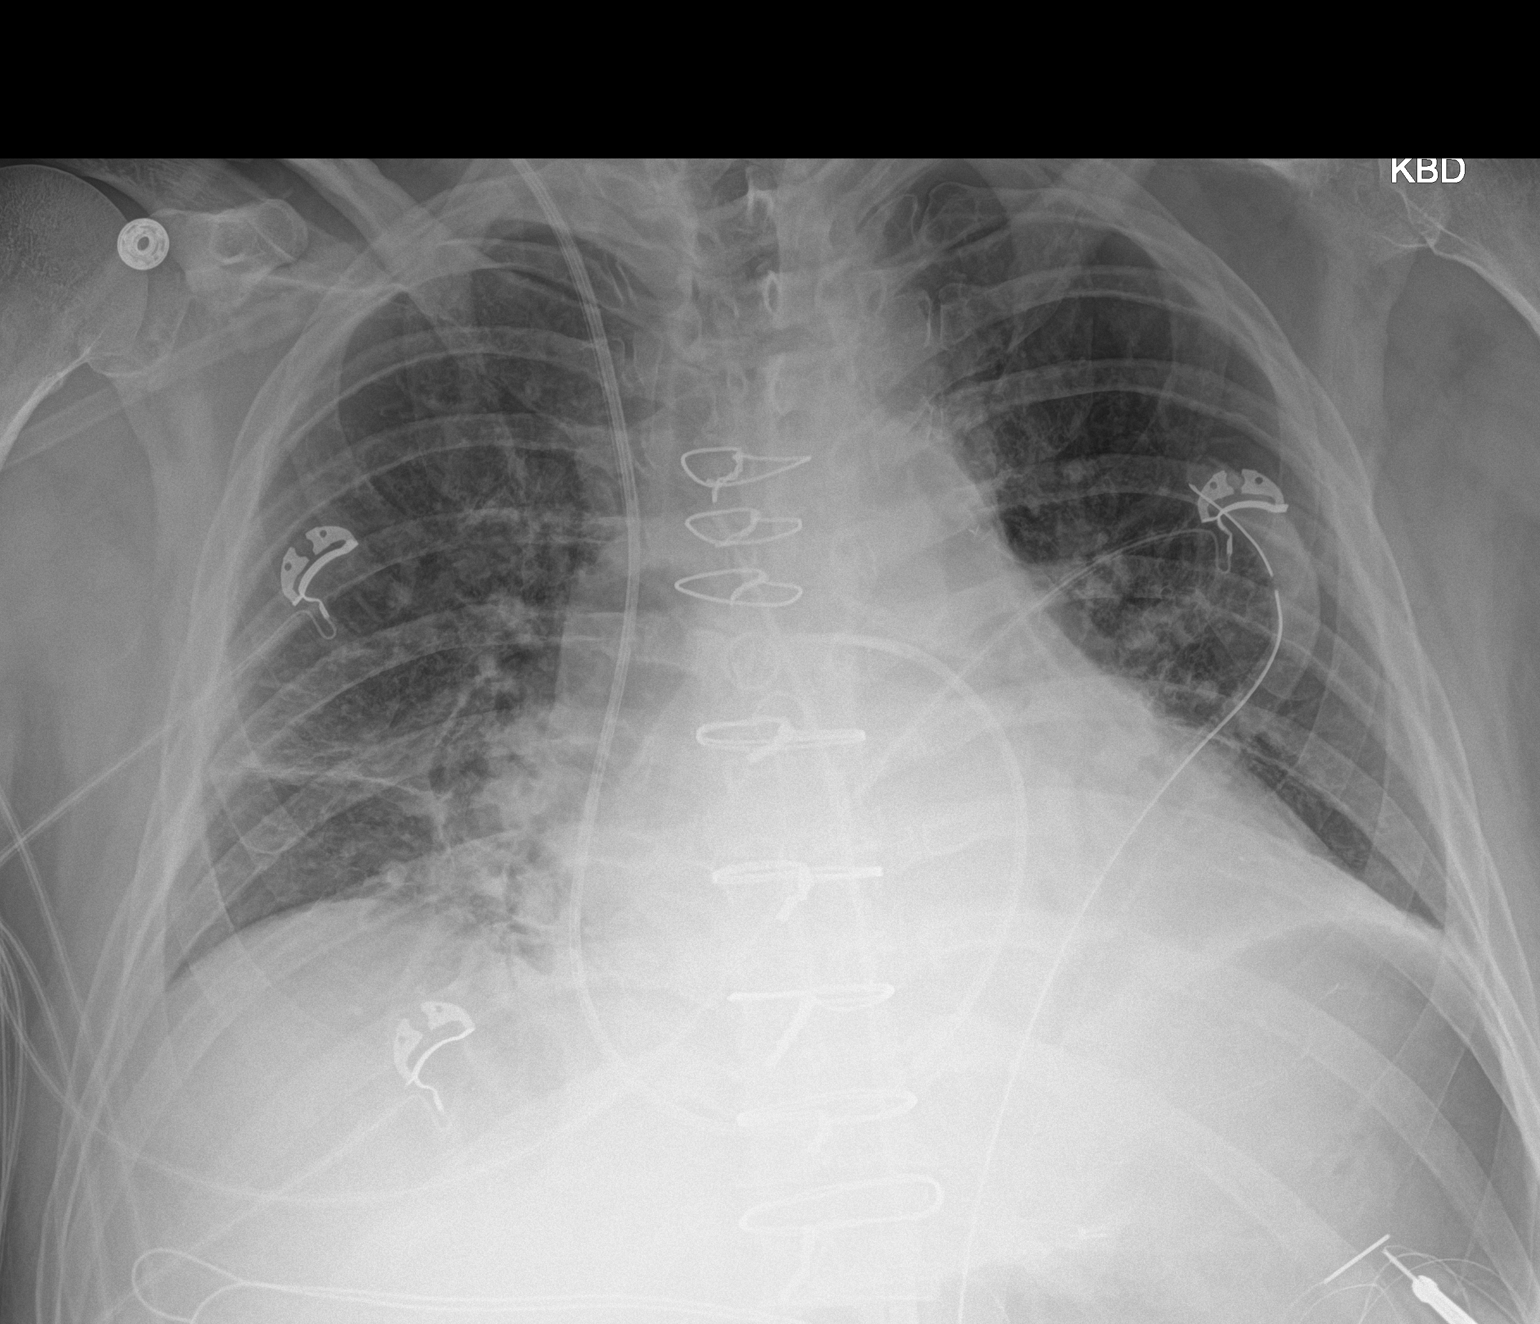

[1 of 1 positions shown; findings below may reference images not displayed]

FINDINGS: Interval removal of endotracheal and enteric tubes. A right IJ
Swan-Ganz catheter terminates at the expected main pulmonary trunk.
Left-sided chest tube and mediastinal drain remain in place. Median
sternotomy wires.

Heart size remains mildly enlarged. Improving aeration of the lung
bases. Mild streaky right basilar opacity, likely atelectasis. No
pleural effusion. No discernible pneumothorax.
IMPRESSION: 1. Interval removal of endotracheal and enteric tubes.
2. Remaining support apparatus in stable positioning. No discernible
pneumothorax.
3. Improving aeration of the lung bases with persistent right
basilar atelectasis.

## 2021-06-15 IMAGING — DX PORTABLE CHEST - 1 VIEW
1 series · 1 of 1 positions shown · non-contrast
Comparison: [DATE] [DATE], [DATE], [DATE] [DATE], [DATE]

CLINICAL DATA: 52-year-old male with systolic and diastolic heart
failure, prior CABG

EXAM:
PORTABLE CHEST 1 VIEW

[chest ap]
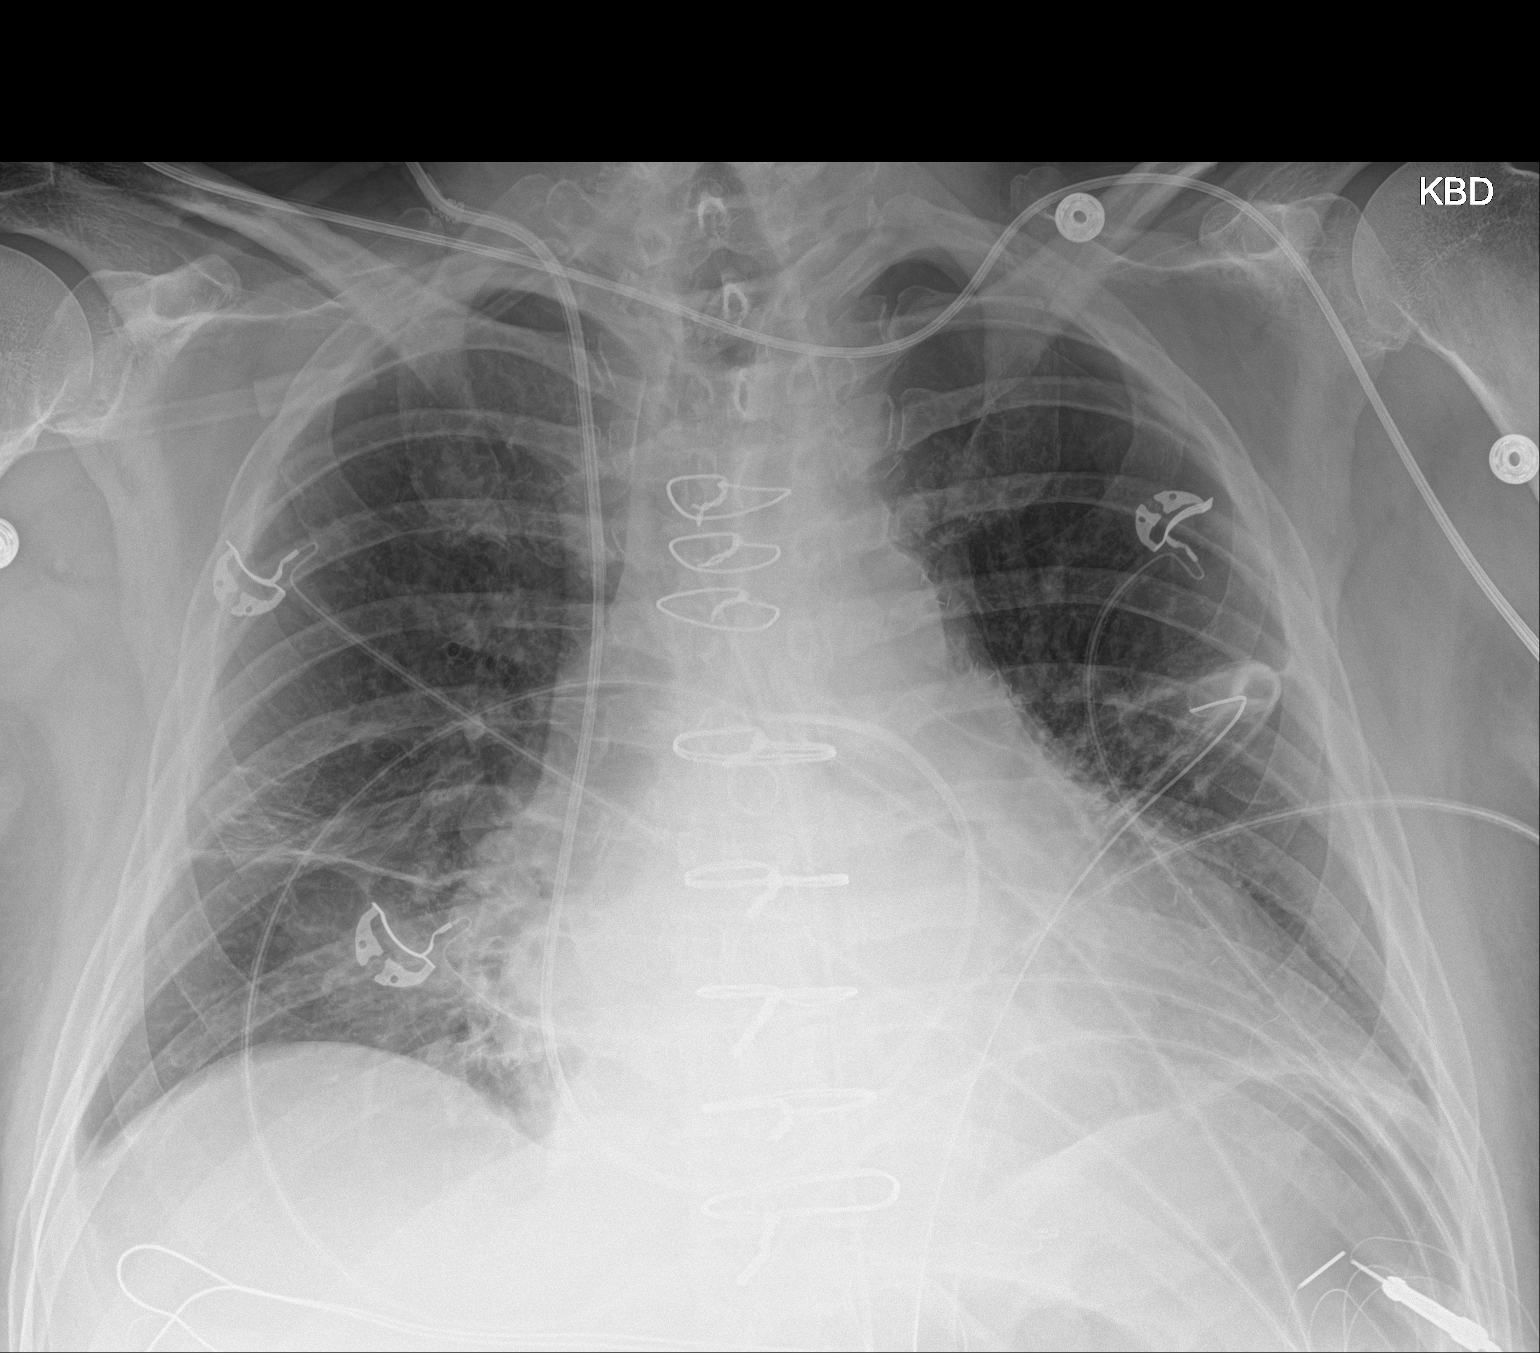

[1 of 1 positions shown; findings below may reference images not displayed]

FINDINGS: Cardiomediastinal silhouette unchanged. Surgical changes of median
sternotomy and CABG.

Right IJ sheath transmits Swan-Ganz catheter, which is unchanged
terminating in the proximal pulmonary artery towards the right.
Mediastinal/pleural drains persist. Epicardial pacing leads
partially visualized.

Low lung volumes with linear opacities at the lung bases.

Interval development of small left pneumothorax

No right pneumothorax visualized.
IMPRESSION: Interval development of small left pneumothorax.

Unchanged mediastinal/pleural drains, with partially visualized
epicardial pacing leads.

Unchanged right IJ sheath transmits a Swan-Ganz catheter which
terminates in the proximal pulmonary artery towards the right.

Post surgical changes of median sternotomy and CABG, with low lung
volumes with likely basilar atelectasis.

## 2021-06-17 ENCOUNTER — Other Ambulatory Visit: Payer: Self-pay

## 2021-06-17 IMAGING — DX PORTABLE CHEST - 1 VIEW
1 series · 1 of 1 positions shown · non-contrast
Comparison: 12/24/2018

CLINICAL DATA: Follow-up CABG

EXAM:
PORTABLE CHEST 1 VIEW

[chest ap]
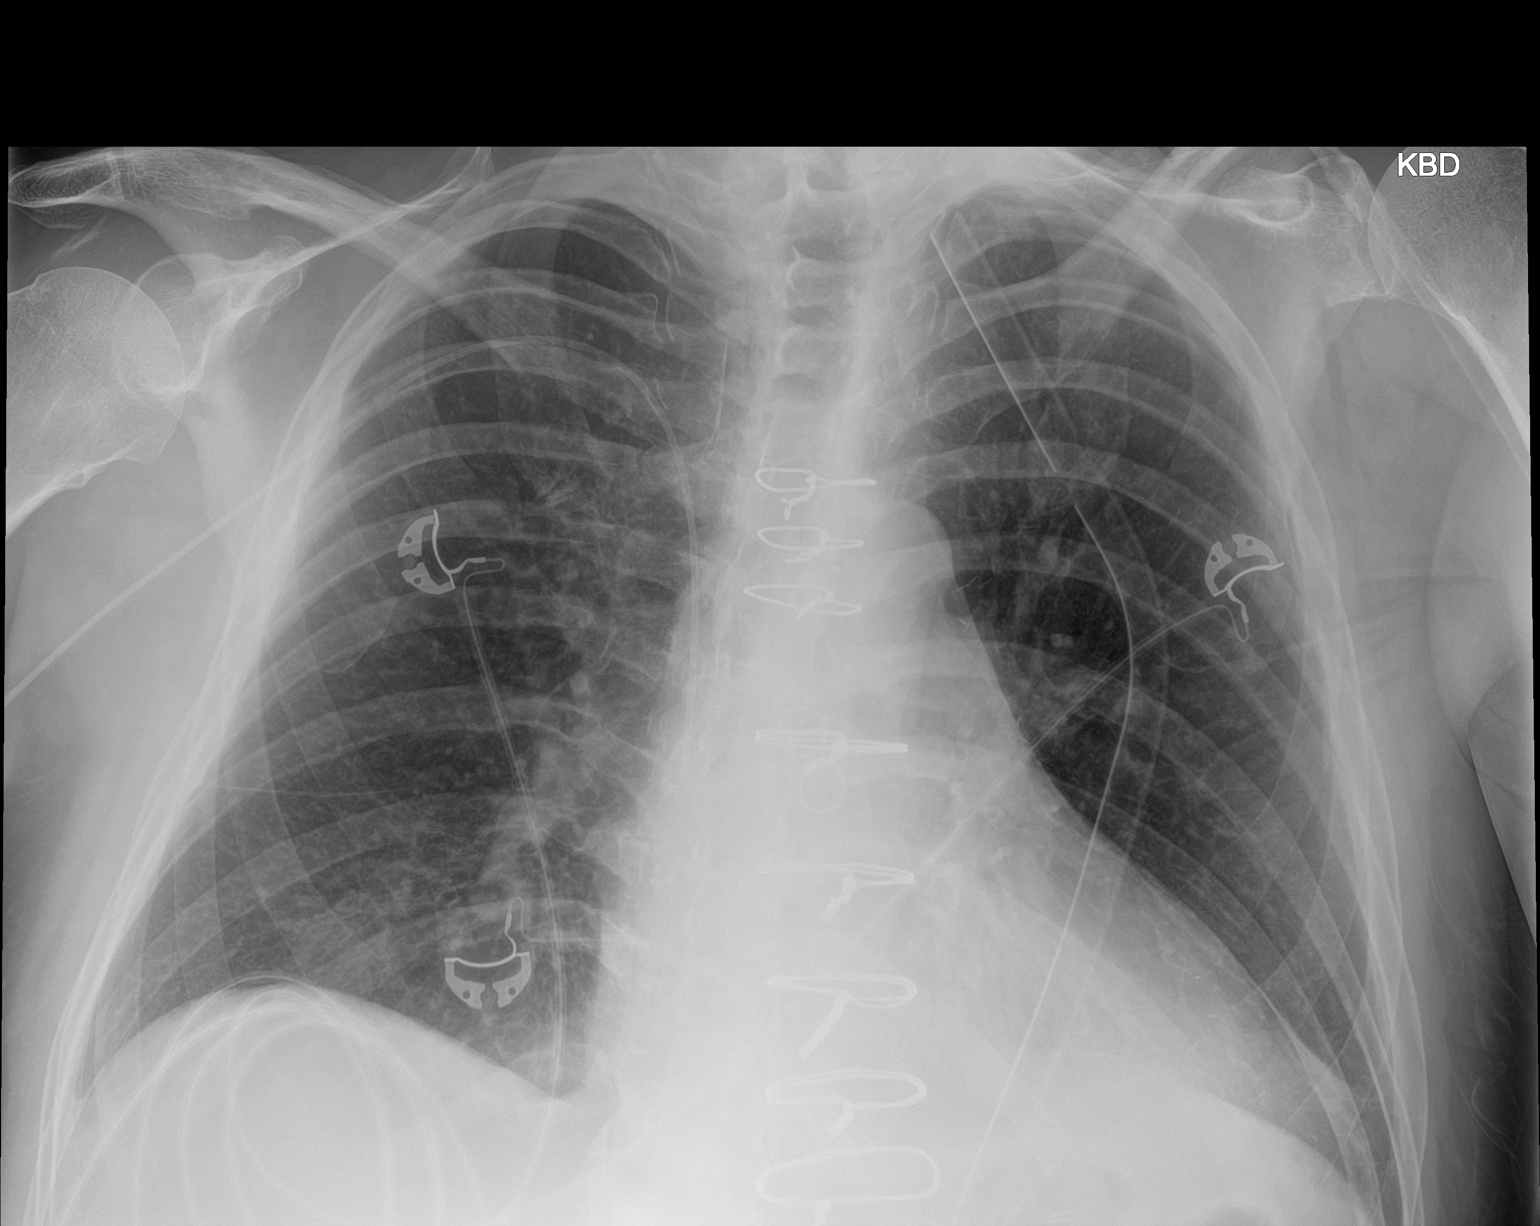

[1 of 1 positions shown; findings below may reference images not displayed]

FINDINGS: Left chest tube remains in place. No pneumothorax. Right arm PICC
tip in the SVC 3 cm above the right atrium. Right chest is clear.
Mild left lower lobe atelectasis.
IMPRESSION: No pneumothorax.  Mild left lower lobe atelectasis.

## 2021-06-19 ENCOUNTER — Other Ambulatory Visit: Payer: Self-pay

## 2021-06-19 ENCOUNTER — Other Ambulatory Visit (HOSPITAL_COMMUNITY): Payer: Self-pay | Admitting: Physician Assistant

## 2021-06-25 ENCOUNTER — Other Ambulatory Visit: Payer: Self-pay

## 2021-06-26 ENCOUNTER — Other Ambulatory Visit: Payer: Self-pay

## 2021-06-27 ENCOUNTER — Other Ambulatory Visit: Payer: Self-pay

## 2021-06-27 ENCOUNTER — Ambulatory Visit (HOSPITAL_COMMUNITY)
Admission: RE | Admit: 2021-06-27 | Discharge: 2021-06-27 | Disposition: A | Discharge status: Home / Self Care | Payer: Medicaid Other | Source: Ambulatory Visit | Attending: Internal Medicine | Admitting: Internal Medicine

## 2021-06-27 DIAGNOSIS — R7989 Other specified abnormal findings of blood chemistry: Secondary | ICD-10-CM | POA: Insufficient documentation

## 2021-06-27 LAB — COMPREHENSIVE METABOLIC PANEL
ALT: 26 U/L (ref 0–44)
AST: 26 U/L (ref 15–41)
Albumin: 3.7 g/dL (ref 3.5–5.0)
Alkaline Phosphatase: 101 U/L (ref 38–126)
Anion gap: 9 (ref 5–15)
BUN: 15 mg/dL (ref 6–20)
CO2: 26 mmol/L (ref 22–32)
Calcium: 8.8 mg/dL — ABNORMAL LOW (ref 8.9–10.3)
Chloride: 104 mmol/L (ref 98–111)
Creatinine, Ser: 1.09 mg/dL (ref 0.61–1.24)
GFR, Estimated: >60 mL/min (ref >60)
Glucose, Bld: 83 mg/dL (ref 70–99)
Potassium: 3.8 mmol/L (ref 3.5–5.1)
Sodium: 139 mmol/L (ref 135–145)
Total Bilirubin: 0.8 mg/dL (ref 0.3–1.2)
Total Protein: 7.3 g/dL (ref 6.5–8.1)

## 2021-06-27 LAB — TSH: TSH: 3.306 u[IU]/mL (ref 0.350–4.500)

## 2021-07-03 ENCOUNTER — Encounter: Payer: Self-pay | Admitting: Family Medicine

## 2021-07-03 ENCOUNTER — Other Ambulatory Visit: Payer: Self-pay

## 2021-07-03 ENCOUNTER — Ambulatory Visit: Payer: Self-pay | Attending: Family Medicine | Admitting: Family Medicine

## 2021-07-03 VITALS — BP 118/73 | HR 88 | Temp 98.3°F | Ht 72.0 in | Wt 188.6 lb

## 2021-07-03 DIAGNOSIS — Z1211 Encounter for screening for malignant neoplasm of colon: Secondary | ICD-10-CM

## 2021-07-03 DIAGNOSIS — R053 Chronic cough: Secondary | ICD-10-CM

## 2021-07-03 DIAGNOSIS — I5022 Chronic systolic (congestive) heart failure: Secondary | ICD-10-CM

## 2021-07-03 DIAGNOSIS — R7303 Prediabetes: Secondary | ICD-10-CM

## 2021-07-03 DIAGNOSIS — M546 Pain in thoracic spine: Secondary | ICD-10-CM

## 2021-07-03 DIAGNOSIS — Z79899 Other long term (current) drug therapy: Secondary | ICD-10-CM

## 2021-07-03 LAB — POCT GLYCOSYLATED HEMOGLOBIN (HGB A1C): Hemoglobin A1C: 5.7 % — AB (ref 4.0–5.6)

## 2021-07-03 MED ORDER — CETIRIZINE HCL 10 MG PO TABS
10.0000 mg | ORAL_TABLET | Freq: Every day | ORAL | 1 refills | Status: DC
  Filled 2021-07-03 (×2): qty 30, 30d supply, fill #0
  Filled 2021-08-05 – 2021-11-05 (×2): qty 30, 30d supply, fill #1

## 2021-07-03 MED ORDER — DAPAGLIFLOZIN PROPANEDIOL 5 MG PO TABS
5.0000 mg | ORAL_TABLET | Freq: Every day | ORAL | 6 refills | Status: DC
  Filled 2021-07-03: qty 30, 30d supply, fill #0

## 2021-07-03 NOTE — Progress Notes (Signed)
Subjective:  Patient ID: Jonathan King, male    DOB: [birth year]  Age: 56 y.o. MRN: [id number]  CC: Cough (Coughing , going on for about 1 year  taking otc medication medication and cough drops and having back pain with cough)   HPI Jonathan King is a 56 y.o. year old male with a history of three-vessel CAD status post CABG, CHF EF 20-25% from 02/2019, NYHA II seen today for a follow up visit. Last office visit was in 03/2019.  Interval History: Complains of cough today which has been present for 1 year and he does not feel like he has mucus in the back of his cough and it is worse during the day. He has no sinus pressure. No h/o smoking.   PFT from 11/2018 reviewed: Pulmonary Function Diagnosis: Normal Spirometry Restriction of exhaled volume  Last visit to CHF clinic was in 04/2021 and his losartan was switched to [facility].  He is currently on amiodarone.  Denies presence of dyspnea, wheezing, pedal edema.  He has thoracic back pain for a few days and has not done any heavy lifting and is rated as 3/10. Pain does radiate. He uses OTC medications with some relief. 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    Past Surgical History:  Procedure Laterality Date   CARDIAC SURGERY     CHOLECYSTECTOMY  01/31/2012   Procedure: LAPAROSCOPIC CHOLECYSTECTOMY;  Surgeon: Gayland Curry, MD,FACS;  Location: [place];  Service: General;  Laterality: N/A;   CORONARY ARTERY BYPASS GRAFT N/A 12/21/2018   Procedure: CORONARY ARTERY BYPASS GRAFTING (CABG) x 3, USING LEFT INTERNAL MAMMARY ARTERY (LIMA) AND RIGHT GREATER SAPHENOUS VEIN (SVG) HARVESTED ENDOSCOPICALLY;    LIMA-LAD, SVG-RAMUS, SVG-DIAG;  Surgeon: Ivin Poot, MD;  Location: [place];  Service: Open Heart Surgery;  Laterality: N/A;   RIGHT/LEFT HEART CATH AND CORONARY ANGIOGRAPHY N/A 12/16/2018   Procedure: RIGHT/LEFT HEART CATH AND CORONARY ANGIOGRAPHY;  Surgeon:  Jolaine Artist, MD;  Location: [place] [facility];  Service: Cardiovascular;  Laterality: N/A;   TEE WITHOUT CARDIOVERSION N/A 12/21/2018   Procedure: TRANSESOPHAGEAL ECHOCARDIOGRAM (TEE);  Surgeon: Prescott Gum, Collier Salina, MD;  Location: [place];  Service: Open Heart Surgery;  Laterality: N/A;    Family History  Problem Relation Age of Onset   Parkinson's disease Mother     Social History   Socioeconomic History   Marital status: Divorced    Spouse name: Not on file   Number of children: Not on file   Years of education: Not on file   Highest education level: Not on file  Occupational History   Not on file  Tobacco Use   Smoking status: Never   Smokeless tobacco: Never  Vaping Use   Vaping Use: Never used  Substance and Sexual Activity   Alcohol use: Yes    Comment: occ   Drug use: No   Sexual activity: Not on file  Other Topics Concern   Not on file  Social History Narrative   Not on file   Social Determinants of Health   Financial Resource Strain: Not on file  Food Insecurity: Not on file  Transportation Needs: Not on file  Physical Activity: Not on file  Stress: Not on file  Social Connections: Not on file    No Known Allergies  Outpatient Medications Prior to Visit  Medication Sig Dispense Refill  aspirin EC 81 MG tablet Take 1 tablet (81 mg total) by mouth daily. 30 tablet 3   atorvastatin (LIPITOR) 80 MG tablet Take 1 tablet (80 mg total) by mouth daily. 30 tablet 6   carvedilol (COREG) 3.125 MG tablet Take 1 tablet (3.125 mg total) by mouth 2 (two) times daily. 60 tablet 6   levothyroxine (SYNTHROID) 50 MCG tablet Take 1 tablet (50 mcg total) by mouth daily before breakfast. 30 tablet 2   potassium chloride (KLOR-CON) 10 MEQ tablet Take 1 tablet (10 mEq total) by mouth daily. Needs appt for further refills 90 tablet 0   sacubitril-valsartan (ENTRESTO) 24-26 MG Take 1 tablet by mouth 2 (two) times daily. 180 tablet 3   spironolactone (ALDACTONE) 25 MG tablet  Take 1 tablet by mouth daily. 90 tablet 3   No facility-administered medications prior to visit.     ROS Review of Systems  Constitutional:  Negative for activity change and appetite change.  HENT:  Negative for sinus pressure and sore throat.   Eyes:  Negative for visual disturbance.  Respiratory:  Positive for cough. Negative for chest tightness and shortness of breath.   Cardiovascular:  Negative for chest pain and leg swelling.  Gastrointestinal:  Negative for abdominal distention, abdominal pain, constipation and diarrhea.  Endocrine: Negative.   Genitourinary:  Negative for dysuria.  Musculoskeletal:  Negative for joint swelling and myalgias.  Skin:  Negative for rash.  Allergic/Immunologic: Negative.   Neurological:  Negative for weakness, light-headedness and numbness.  Psychiatric/Behavioral:  Negative for dysphoric mood and suicidal ideas.    Objective:  BP 118/73    Pulse 88    Temp 98.3 F (36.8 C)    Ht 6' (1.829 m)    Wt 188 lb 9.6 oz (85.5 kg)    SpO2 98%    BMI 25.58 kg/m   BP/Weight 07/03/2021 05/16/2021 7/41/2878  Systolic BP 676 720 947  Diastolic BP 73 90 78  Wt. (Lbs) 188.6 198 -  BMI 25.58 26.85 -      Physical Exam Constitutional:      Appearance: He is well-developed.  Cardiovascular:     Rate and Rhythm: Normal rate.     Heart sounds: Normal heart sounds. No murmur heard. Pulmonary:     Effort: Pulmonary effort is normal.     Breath sounds: Normal breath sounds. No wheezing or rales.  Chest:     Chest wall: No tenderness.  Abdominal:     General: Bowel sounds are normal. There is no distension.     Palpations: Abdomen is soft. There is no mass.     Tenderness: There is no abdominal tenderness.  Musculoskeletal:        General: Normal range of motion.     Right lower leg: No edema.     Left lower leg: No edema.  Neurological:     Mental Status: He is alert and oriented to person, place, and time.  Psychiatric:        Mood and Affect: Mood  normal.    CMP Latest Ref Rng & Units 06/27/2021 05/16/2021 07/27/2020  Glucose 70 - 99 mg/dL 83 123(H) 88  BUN 6 - 20 mg/dL 15 19 18   Creatinine 0.61 - 1.24 mg/dL 1.09 0.98 1.08  Sodium 135 - 145 mmol/L 139 139 135  Potassium 3.5 - 5.1 mmol/L 3.8 3.7 3.9  Chloride 98 - 111 mmol/L 104 102 103  CO2 22 - 32 mmol/L 26 23 27   Calcium 8.9 - 10.3  mg/dL 8.8(L) 9.4 9.1  Total Protein 6.5 - 8.1 g/dL 7.3 7.5 7.2  Total Bilirubin 0.3 - 1.2 mg/dL 0.8 0.7 0.8  Alkaline Phos 38 - 126 U/L 101 92 94  AST 15 - 41 U/L 26 80(H) 25  ALT 0 - 44 U/L 26 93(H) 28    Lipid Panel     Component Value Date/Time   CHOL 93 06/01/2019 1436   TRIG 113 06/01/2019 1436   HDL 36 (L) 06/01/2019 1436   CHOLHDL 2.6 06/01/2019 1436   VLDL 23 06/01/2019 1436   LDLCALC 34 06/01/2019 1436    CBC    Component Value Date/Time   WBC 6.9 07/27/2020 1129   RBC 4.44 07/27/2020 1129   HGB 13.7 07/27/2020 1129   HCT 42.3 07/27/2020 1129   PLT 245 07/27/2020 1129   MCV 95.3 07/27/2020 1129   MCH 30.9 07/27/2020 1129   MCHC 32.4 07/27/2020 1129   RDW 14.2 07/27/2020 1129   LYMPHSABS 2.3 12/19/2018 0555   MONOABS 0.8 12/19/2018 0555   EOSABS 0.3 12/19/2018 0555   BASOSABS 0.1 12/19/2018 0555    Lab Results  Component Value Date   HGBA1C 5.7 (A) 07/03/2021    Lab Results  Component Value Date   TSH 3.306 06/27/2021    Assessment & Plan:  1. Pre-diabetes A1c of 5.7 which has improved from 6.1 previously Placed on Farxiga due to cardiovascular benefit Continue to work on lifestyle modification to prevent progression to type 2 diabetes mellitus - dapagliflozin propanediol (FARXIGA) 5 MG TABS tablet; Take 1 tablet (5 mg total) by mouth daily before breakfast.  Dispense: 30 tablet; Refill: 6 - HgB A1c  2. Screening for colon cancer - Fecal occult blood, imunochemical([facility])  3. Chronic cough Symptoms have been present for over 1 year We will place on antihistamine We will also need to evaluate  pulmonary function due to the fact that he is on amiodarone which can predispose to interstitial lung disease - Pulmonary function test; Future - DG Chest 2 View; Future - cetirizine (ZYRTEC) 10 MG tablet; Take 1 tablet (10 mg total) by mouth daily.  Dispense: 30 tablet; Refill: 1  4. High risk medication use Currently on amiodarone - Pulmonary function test; Future  5. Acute midline thoracic back pain Mild pain which improved with OTC analgesics Continue OTC analgesic Advised to apply heat or ice whichever is tolerated to painful areas. Counseled on evidence of improvement in pain control with regards to yoga, water aerobics, massage, home physical therapy, exercise as tolerated.   6. Chronic HFrEF (heart failure with reduced ejection fraction) (HCC) EF of 20 to 25% Euvolemic Continue guideline directed medical therapy, SGLT2 inhibitor added to regimen Follow-up with CHF clinic - dapagliflozin propanediol (FARXIGA) 5 MG TABS tablet; Take 1 tablet (5 mg total) by mouth daily before breakfast.  Dispense: 30 tablet; Refill: 6    Meds ordered this encounter  Medications   cetirizine (ZYRTEC) 10 MG tablet    Sig: Take 1 tablet (10 mg total) by mouth daily.    Dispense:  30 tablet    Refill:  1   dapagliflozin propanediol (FARXIGA) 5 MG TABS tablet    Sig: Take 1 tablet (5 mg total) by mouth daily before breakfast.    Dispense:  30 tablet    Refill:  6    Follow-up: Return in about 6 months (around 01/03/2022) for Chronic medical conditions.       Charlott Rakes, MD, FAAFP. [facility] and  [place], [place]   07/03/2021, 4:27 PM

## 2021-07-03 NOTE — Patient Instructions (Addendum)
Prediabetes Eating Plan °Prediabetes is a condition that causes blood sugar (glucose) levels to be higher than normal. This increases the risk for developing type 2 diabetes (type 2 diabetes mellitus). Working with a health care provider or nutrition specialist (dietitian) to make diet and lifestyle changes can help prevent the onset of diabetes. These changes may help you: °Control your blood glucose levels. °Improve your cholesterol levels. °Manage your blood pressure. °What are tips for following this plan? °Reading food labels °Read food labels to check the amount of fat, salt (sodium), and sugar in prepackaged foods. Avoid foods that have: °Saturated fats. °Trans fats. °Added sugars. °Avoid foods that have more than 300 milligrams (mg) of sodium per serving. Limit your sodium intake to less than 2,300 mg each day. °Shopping °Avoid buying pre-made and processed foods. °Avoid buying drinks with added sugar. °Cooking °Cook with olive oil. Do not use butter, lard, or ghee. °Bake, broil, grill, steam, or boil foods. Avoid frying. °Meal planning ° °Work with your dietitian to create an eating plan that is right for you. This may include tracking how many calories you take in each day. Use a food diary, notebook, or mobile application to track what you eat at each meal. °Consider following a Mediterranean diet. This includes: °Eating several servings of fresh fruits and vegetables each day. °Eating fish at least twice a week. °Eating one serving each day of whole grains, beans, nuts, and seeds. °Using olive oil instead of other fats. °Limiting alcohol. °Limiting red meat. °Using nonfat or low-fat dairy products. °Consider following a plant-based diet. This includes dietary choices that focus on eating mostly vegetables and fruit, grains, beans, nuts, and seeds. °If you have high blood pressure, you may need to limit your sodium intake or follow a diet such as the DASH (Dietary Approaches to Stop Hypertension) eating  plan. The DASH diet aims to lower high blood pressure. °Lifestyle °Set weight loss goals with help from your health care team. It is recommended that most people with prediabetes lose 7% of their body weight. °Exercise for at least 30 minutes 5 or more days a week. °Attend a support group or seek support from a mental health counselor. °Take over-the-counter and prescription medicines only as told by your health care provider. °What foods are recommended? °Fruits °Berries. Bananas. Apples. Oranges. Grapes. Papaya. Mango. Pomegranate. Kiwi. Grapefruit. Cherries. °Vegetables °Lettuce. Spinach. Peas. Beets. Cauliflower. Cabbage. Broccoli. Carrots. Tomatoes. Squash. Eggplant. Herbs. Peppers. Onions. Cucumbers. Brussels sprouts. °Grains °Whole grains, such as whole-wheat or whole-grain breads, crackers, cereals, and pasta. Unsweetened oatmeal. Bulgur. Barley. Quinoa. Brown rice. Corn or whole-wheat flour tortillas or taco shells. °Meats and other proteins °Seafood. Poultry without skin. Lean cuts of pork and beef. Tofu. Eggs. Nuts. Beans. °Dairy °Low-fat or fat-free dairy products, such as yogurt, cottage cheese, and cheese. °Beverages °Water. Tea. Coffee. Sugar-free or diet soda. Seltzer water. Low-fat or nonfat milk. Milk alternatives, such as soy or almond milk. °Fats and oils °Olive oil. Canola oil. Sunflower oil. Grapeseed oil. Avocado. Walnuts. °Sweets and desserts °Sugar-free or low-fat pudding. Sugar-free or low-fat ice cream and other frozen treats. °Seasonings and condiments °Herbs. Sodium-free spices. Mustard. Relish. Low-salt, low-sugar ketchup. Low-salt, low-sugar barbecue sauce. Low-fat or fat-free mayonnaise. °The items listed above may not be a complete list of recommended foods and beverages. Contact a dietitian for more information. °What foods are not recommended? °Fruits °Fruits canned with syrup. °Vegetables °Canned vegetables. Frozen vegetables with butter or cream sauce. °Grains °Refined white  flour and flour   products, such as bread, pasta, snack foods, and cereals. °Meats and other proteins °Fatty cuts of meat. Poultry with skin. Breaded or fried meat. Processed meats. °Dairy °Full-fat yogurt, cheese, or milk. °Beverages °Sweetened drinks, such as iced tea and soda. °Fats and oils °Butter. Lard. Ghee. °Sweets and desserts °Baked goods, such as cake, cupcakes, pastries, cookies, and cheesecake. °Seasonings and condiments °Spice mixes with added salt. Ketchup. Barbecue sauce. Mayonnaise. °The items listed above may not be a complete list of foods and beverages that are not recommended. Contact a dietitian for more information. °Where to find more information °American Diabetes Association: www.diabetes.org °Summary °You may need to make diet and lifestyle changes to help prevent the onset of diabetes. These changes can help you control blood sugar, improve cholesterol levels, and manage blood pressure. °Set weight loss goals with help from your health care team. It is recommended that most people with prediabetes lose 7% of their body weight. °Consider following a Mediterranean diet. This includes eating plenty of fresh fruits and vegetables, whole grains, beans, nuts, seeds, fish, and low-fat dairy, and using olive oil instead of other fats. °This information is not intended to replace advice given to you by your health care provider. Make sure you discuss any questions you have with your health care provider. °Document Revised: 07/14/2019 Document Reviewed: 07/14/2019 °Elsevier Patient Education © 2022 Elsevier Inc. ° °

## 2021-07-05 ENCOUNTER — Other Ambulatory Visit: Payer: Self-pay

## 2021-07-22 ENCOUNTER — Other Ambulatory Visit (HOSPITAL_COMMUNITY): Payer: Self-pay

## 2021-07-22 ENCOUNTER — Other Ambulatory Visit: Payer: Self-pay

## 2021-07-22 DIAGNOSIS — I5022 Chronic systolic (congestive) heart failure: Secondary | ICD-10-CM

## 2021-07-22 DIAGNOSIS — R7303 Prediabetes: Secondary | ICD-10-CM

## 2021-07-22 MED ORDER — POTASSIUM CHLORIDE ER 10 MEQ PO TBCR
10.0000 meq | EXTENDED_RELEASE_TABLET | Freq: Every day | ORAL | 3 refills | Status: DC
  Filled 2021-07-22: qty 30, 30d supply, fill #0
  Filled 2021-07-22: qty 90, 90d supply, fill #0
  Filled 2021-08-28: qty 30, 30d supply, fill #1
  Filled 2021-08-28: qty 30, 30d supply, fill #0
  Filled 2021-09-23: qty 30, 30d supply, fill #1
  Filled 2021-10-22: qty 30, 30d supply, fill #2
  Filled 2021-11-23: qty 30, 30d supply, fill #3
  Filled 2021-12-22: qty 30, 30d supply, fill #4
  Filled 2022-01-20: qty 30, 30d supply, fill #5
  Filled 2022-02-16: qty 30, 30d supply, fill #6
  Filled 2022-03-23: qty 30, 30d supply, fill #7
  Filled 2022-04-20: qty 30, 30d supply, fill #8
  Filled 2022-05-18: qty 30, 30d supply, fill #9
  Filled 2022-06-17: qty 30, 30d supply, fill #10

## 2021-07-22 MED ORDER — ASPIRIN EC 81 MG PO TBEC: 81.0000 mg | DELAYED_RELEASE_TABLET | Freq: Every day | ORAL | 11 refills | Status: AC

## 2021-07-22 MED ORDER — ATORVASTATIN CALCIUM 80 MG PO TABS
80.0000 mg | ORAL_TABLET | Freq: Every day | ORAL | 11 refills | Status: DC
  Filled 2021-07-22 – 2021-08-26 (×2): qty 30, 30d supply, fill #0
  Filled 2021-08-26 – 2021-10-03 (×2): qty 30, 30d supply, fill #1
  Filled 2021-11-05: qty 30, 30d supply, fill #2
  Filled 2021-12-08: qty 30, 30d supply, fill #3
  Filled 2022-01-07: qty 30, 30d supply, fill #4
  Filled 2022-02-07: qty 30, 30d supply, fill #5
  Filled 2022-03-10: qty 30, 30d supply, fill #6
  Filled 2022-04-14: qty 30, 30d supply, fill #7
  Filled 2022-05-18: qty 30, 30d supply, fill #8
  Filled 2022-06-17: qty 30, 30d supply, fill #9
  Filled 2022-07-20: qty 30, 30d supply, fill #10

## 2021-07-22 MED ORDER — SPIRONOLACTONE 25 MG PO TABS
25.0000 mg | ORAL_TABLET | Freq: Every day | ORAL | 3 refills | Status: DC
  Filled 2021-07-22: qty 90, fill #0
  Filled 2021-07-22: qty 30, 30d supply, fill #0
  Filled 2021-08-26: qty 30, 30d supply, fill #1
  Filled 2021-08-26: qty 30, 30d supply, fill #0
  Filled 2021-09-23: qty 30, 30d supply, fill #1
  Filled 2021-10-22: qty 30, 30d supply, fill #2
  Filled 2021-11-23: qty 30, 30d supply, fill #3
  Filled 2021-12-22: qty 30, 30d supply, fill #4
  Filled 2022-01-20: qty 30, 30d supply, fill #5
  Filled 2022-02-16: qty 30, 30d supply, fill #6
  Filled 2022-03-23: qty 30, 30d supply, fill #7
  Filled 2022-04-13: qty 30, 30d supply, fill #8
  Filled 2022-05-18: qty 30, 30d supply, fill #9
  Filled 2022-06-17: qty 30, 30d supply, fill #10

## 2021-07-22 MED ORDER — DAPAGLIFLOZIN PROPANEDIOL 5 MG PO TABS
5.0000 mg | ORAL_TABLET | Freq: Every day | ORAL | 11 refills | Status: DC
  Filled 2021-07-22 – 2021-09-13 (×4): qty 30, 30d supply, fill #0

## 2021-07-22 MED ORDER — CARVEDILOL 3.125 MG PO TABS
3.1250 mg | ORAL_TABLET | Freq: Two times a day (BID) | ORAL | 11 refills | Status: DC
  Filled 2021-07-22 (×2): qty 60, 30d supply, fill #0
  Filled 2021-08-28: qty 60, 30d supply, fill #1
  Filled 2021-08-28: qty 60, 30d supply, fill #0
  Filled 2021-09-23: qty 60, 30d supply, fill #1
  Filled 2021-10-22: qty 60, 30d supply, fill #2
  Filled 2021-11-23: qty 60, 30d supply, fill #3
  Filled 2021-12-22: qty 60, 30d supply, fill #4
  Filled 2022-01-20: qty 60, 30d supply, fill #5
  Filled 2022-02-16: qty 60, 30d supply, fill #6
  Filled 2022-03-23: qty 60, 30d supply, fill #7
  Filled 2022-04-20: qty 60, 30d supply, fill #8
  Filled 2022-05-18: qty 60, 30d supply, fill #9
  Filled 2022-06-17: qty 60, 30d supply, fill #10

## 2021-07-22 NOTE — Telephone Encounter (Signed)
Advanced Heart Failure Patient Advocate Encounter ? ?Called and left patient detailed message regarding Entresto assistance. He would need to provide POI or call [employer] at [phone] and ask for an income attestation form to be sent out. The patient would fill that in and send back to the company before the application will continue to be processed.  ? ?Charlann Boxer, CPhT ? ?

## 2021-07-23 NOTE — Telephone Encounter (Signed)
Patient called back, left message. Called him back. He is going to bring POI to the clinic. ? ?Will send in once received.  ?

## 2021-07-24 NOTE — Telephone Encounter (Signed)
Sent POI to [employer] via fax.  ?

## 2021-08-05 ENCOUNTER — Other Ambulatory Visit: Payer: Self-pay

## 2021-08-26 ENCOUNTER — Other Ambulatory Visit (HOSPITAL_COMMUNITY): Payer: Self-pay

## 2021-08-26 ENCOUNTER — Other Ambulatory Visit (HOSPITAL_COMMUNITY): Payer: Self-pay | Admitting: Family Medicine

## 2021-08-26 ENCOUNTER — Other Ambulatory Visit: Payer: Self-pay

## 2021-08-26 MED ORDER — LEVOTHYROXINE SODIUM 50 MCG PO TABS
50.0000 ug | ORAL_TABLET | Freq: Every day | ORAL | 2 refills | Status: DC
  Filled 2021-08-26: qty 30, 30d supply, fill #0
  Filled 2021-09-24: qty 30, 30d supply, fill #1
  Filled 2021-10-22: qty 30, 30d supply, fill #2

## 2021-08-28 ENCOUNTER — Other Ambulatory Visit (HOSPITAL_COMMUNITY): Payer: Self-pay

## 2021-08-28 ENCOUNTER — Other Ambulatory Visit: Payer: Self-pay

## 2021-09-13 ENCOUNTER — Other Ambulatory Visit: Payer: Self-pay

## 2021-09-13 ENCOUNTER — Ambulatory Visit (HOSPITAL_COMMUNITY)
Admission: RE | Admit: 2021-09-13 | Discharge: 2021-09-13 | Disposition: A | Discharge status: Home / Self Care | Payer: Self-pay | Source: Ambulatory Visit | Attending: Internal Medicine | Admitting: Internal Medicine

## 2021-09-13 ENCOUNTER — Encounter (HOSPITAL_COMMUNITY): Payer: Self-pay | Admitting: Internal Medicine

## 2021-09-13 ENCOUNTER — Other Ambulatory Visit (HOSPITAL_COMMUNITY): Payer: Self-pay

## 2021-09-13 VITALS — BP 104/60 | HR 84 | Wt 174.2 lb

## 2021-09-13 DIAGNOSIS — R634 Abnormal weight loss: Secondary | ICD-10-CM | POA: Insufficient documentation

## 2021-09-13 DIAGNOSIS — Z7982 Long term (current) use of aspirin: Secondary | ICD-10-CM | POA: Insufficient documentation

## 2021-09-13 DIAGNOSIS — I4891 Unspecified atrial fibrillation: Secondary | ICD-10-CM

## 2021-09-13 DIAGNOSIS — I255 Ischemic cardiomyopathy: Secondary | ICD-10-CM | POA: Insufficient documentation

## 2021-09-13 DIAGNOSIS — I9789 Other postprocedural complications and disorders of the circulatory system, not elsewhere classified: Secondary | ICD-10-CM | POA: Insufficient documentation

## 2021-09-13 DIAGNOSIS — I5022 Chronic systolic (congestive) heart failure: Secondary | ICD-10-CM | POA: Insufficient documentation

## 2021-09-13 DIAGNOSIS — Z79899 Other long term (current) drug therapy: Secondary | ICD-10-CM | POA: Insufficient documentation

## 2021-09-13 DIAGNOSIS — Z951 Presence of aortocoronary bypass graft: Secondary | ICD-10-CM | POA: Insufficient documentation

## 2021-09-13 DIAGNOSIS — I5042 Chronic combined systolic (congestive) and diastolic (congestive) heart failure: Secondary | ICD-10-CM

## 2021-09-13 DIAGNOSIS — I493 Ventricular premature depolarization: Secondary | ICD-10-CM

## 2021-09-13 DIAGNOSIS — I2510 Atherosclerotic heart disease of native coronary artery without angina pectoris: Secondary | ICD-10-CM | POA: Insufficient documentation

## 2021-09-13 LAB — CBC
HCT: 46.2 % (ref 39.0–52.0)
Hemoglobin: 15.6 g/dL (ref 13.0–17.0)
MCH: 30.1 pg (ref 26.0–34.0)
MCHC: 33.8 g/dL (ref 30.0–36.0)
MCV: 89 fL (ref 80.0–100.0)
Platelets: 270 10*3/uL (ref 150–400)
RBC: 5.19 MIL/uL (ref 4.22–5.81)
RDW: 11.9 % (ref 11.5–15.5)
WBC: 7.4 10*3/uL (ref 4.0–10.5)
nRBC: 0 % (ref 0.0–0.2)

## 2021-09-13 LAB — COMPREHENSIVE METABOLIC PANEL
ALT: 37 U/L (ref 0–44)
AST: 28 U/L (ref 15–41)
Albumin: 3.8 g/dL (ref 3.5–5.0)
Alkaline Phosphatase: 109 U/L (ref 38–126)
Anion gap: 9 (ref 5–15)
BUN: 15 mg/dL (ref 6–20)
CO2: 24 mmol/L (ref 22–32)
Calcium: 9.4 mg/dL (ref 8.9–10.3)
Chloride: 105 mmol/L (ref 98–111)
Creatinine, Ser: 0.81 mg/dL (ref 0.61–1.24)
GFR, Estimated: >60 mL/min (ref >60)
Glucose, Bld: 105 mg/dL — ABNORMAL HIGH (ref 70–99)
Potassium: 4.4 mmol/L (ref 3.5–5.1)
Sodium: 138 mmol/L (ref 135–145)
Total Bilirubin: 0.7 mg/dL (ref 0.3–1.2)
Total Protein: 7.6 g/dL (ref 6.5–8.1)

## 2021-09-13 LAB — LIPID PANEL
Cholesterol: 77 mg/dL (ref 0–200)
HDL: 28 mg/dL — ABNORMAL LOW (ref >40)
LDL Cholesterol: 28 mg/dL (ref 0–99)
Total CHOL/HDL Ratio: 2.8 RATIO
Triglycerides: 103 mg/dL (ref <150)
VLDL: 21 mg/dL (ref 0–40)

## 2021-09-13 LAB — T4, FREE: Free T4: 1.28 ng/dL — ABNORMAL HIGH (ref 0.61–1.12)

## 2021-09-13 LAB — TSH: TSH: 0.023 u[IU]/mL — ABNORMAL LOW (ref 0.350–4.500)

## 2021-09-13 NOTE — Progress Notes (Signed)
ADVANCED HF CLINIC NOTE  Primary HF Cardiologist: Dr Haroldine Laws CT Surgery: Dr Prescott Gum PCP: Dr. 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   Admitted with increase shortness of breath. Echo 12/11/18 LVEF 15-20% with moderate RV dysfunction .Chest CT 8/17  with suggestion of severe CAD with totally occluded LAD and high-grade ramus lesion. Also LLL nodule suggestive of lung CA (versus infection). He underwent CABG x3 on 12/21/18. Post operative course was uneventful. He was discharged to home with [facility] on 12/30/2018.   Had follow up with CT surgery. Plan for CT scan of chest to reassess possible mass in a couple of months. Amio was stopped at that visit.   CT chest 11/20 resolution of infiltrate/nodule  Here for f/u. Says he feels good. Denies CP or SOB. No orthopnea. Has lost 15 pounds but not trying to lose weight. Compliant with meds   Cardiac Studies: Echo 01/22: EF 45-50%, RV okay, mild to moderate MR. Echo 2/21: 30-35%, RV mildly reduced. Echo 11/2018: EF 15%. RV moderately reduced.    RHC/LHC 12/16/18 RIGHT/LEFT HEART CATH AND CORONARY ANGIOGRAPHY  Conclusion     RPDA lesion is 70% stenosed. Ost LAD to Prox LAD lesion is 99% stenosed. Prox LAD to Mid LAD lesion is 100% stenosed. 1st Diag lesion is 99% stenosed. Ramus lesion is 90% stenosed.   Findings:   Ao = 88/51 (68) LV = 86/17 RA = 3 RV = 46/5 PA = 45/10 (23) PCW = 17 Fick cardiac output/index = 4.2/2.0 PVR = 1.5 WU Ao sat = 98% PA sat = 67%, 68%   Assessment: 1. 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 Well-compensated filling pressures with moderately reduced CO   ROS: All systems negative except as listed in HPI, PMH and Problem List.  SH:  Social History   Socioeconomic History   Marital status: Divorced    Spouse name:  Not on file   Number of children: Not on file   Years of education: Not on file   Highest education level: Not on file  Occupational History   Not on file  Tobacco Use   Smoking status: Never   Smokeless tobacco: Never  Vaping Use   Vaping Use: Never used  Substance and Sexual Activity   Alcohol use: Yes    Comment: occ   Drug use: No   Sexual activity: Not on file  Other Topics Concern   Not on file  Social History Narrative   Not on file   Social Determinants of Health   Financial Resource Strain: Not on file  Food Insecurity: Not on file  Transportation Needs: Not on file  Physical Activity: Not on file  Stress: Not on file  Social Connections: Not on file  Intimate Partner Violence: Not on file    FH:  Family History  Problem Relation Age of Onset   Parkinson's disease Mother     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    Current Outpatient Medications  Medication Sig Dispense Refill   aspirin EC 81 MG tablet Take 1 tablet (81 mg total) by mouth daily. 30 tablet 11   atorvastatin (LIPITOR) 80 MG tablet Take 1 tablet (80 mg total) by  mouth daily. 30 tablet 11   carvedilol (COREG) 3.125 MG tablet Take 1 tablet (3.125 mg total) by mouth 2 (two) times daily. 60 tablet 11   cetirizine (ZYRTEC) 10 MG tablet Take 1 tablet (10 mg total) by mouth daily. 30 tablet 1   dapagliflozin propanediol (FARXIGA) 5 MG TABS tablet Take 1 tablet (5 mg total) by mouth daily before breakfast. 30 tablet 11   levothyroxine (SYNTHROID) 50 MCG tablet Take 1 tablet (50 mcg total) by mouth daily before breakfast. 30 tablet 2   potassium chloride (KLOR-CON) 10 MEQ tablet Take 1 tablet (10 mEq total) by mouth daily. Needs appt for further refills 90 tablet 3   sacubitril-valsartan (ENTRESTO) 24-26 MG Take 1 tablet by mouth 2 (two) times daily. 180 tablet 3   spironolactone (ALDACTONE) 25 MG tablet Take 1 tablet (25  mg total) by mouth daily. 90 tablet 3   No current facility-administered medications for this encounter.    Vitals:   09/13/21 1429  BP: 104/60  Pulse: 84  SpO2: 96%  Weight: 79 kg (174 lb 3.2 oz)    Wt Readings from Last 3 Encounters:  09/13/21 79 kg (174 lb 3.2 oz)  07/03/21 85.5 kg (188 lb 9.6 oz)  05/16/21 89.8 kg (198 lb)    PHYSICAL EXAM: General:  Well appearing. No resp difficulty HEENT: normal Neck: supple. no JVD. Carotids 2+ bilat; no bruits. No lymphadenopathy or thryomegaly appreciated. Cor: PMI nondisplaced. Regular rate & rhythm. No rubs, gallops or murmurs. Lungs: clear Abdomen: soft, nontender, nondistended. No hepatosplenomegaly. No bruits or masses. Good bowel sounds. Extremities: no cyanosis, clubbing, rash, edema Neuro: alert & orientedx3, cranial nerves grossly intact. moves all 4 extremities w/o difficulty. Affect pleasant+ Stuttering  ECG: NSR 86 RBBB Personally reviewed  ASSESSMENT & PLAN:  1. CAD  - s/p CABG x3 on 12/21/18 - Continue ASA and statin - No s/s angina - Continue ASA, statin & b-blocker  2. Post Operative Atrial Fibrillation:  - In NSR today  - Off amio (was on for PVCs)   3. Chronic Systolic CHF, ICM : - Echo 8/20 EF 20-25%  - Echo 11/20 EF 20-25% - Echo 2/21 EF 30-35% - Echo 01/22 EF 45-50%, RV okay, mild to moderate MR - NYHA II. Volume status stable. He does not need lasix.  - Continue Entresto 24/26 bid - Continue 3.125 mg carvedilol bid  - Continue 25 spiro mg daily - Continue Farxiga 10 - Labs today - Repeat echo  4. High Burden PVCs:  ~ 20% burden initially. Suspect likely contributing to cardiomyopathy.  - suppressed on amio - Amio stopped 01/26/19. PVCs restarted 11/20 - Zio 2/21 no AF, mostly in SR w/ 1st degree HB, some ventricular bigeminy and trigeminy present - Off amio as of 1/23 - Repeat Zio. If PVCs returning can try mexilitene 200 bid   5. Weight loss  - will check TFTs given previous  amio  treatment  Glori Bickers MD 2:52 PM

## 2021-09-13 NOTE — Patient Instructions (Signed)
Medication Changes:  None, continue current medications  Lab Work:  Labs done today, your results will be available in MyChart, we will contact you for abnormal readings.   Testing/Procedures:  Your physician has requested that you have an echocardiogram. Echocardiography is a painless test that uses sound waves to create images of your heart. It provides your doctor with information about the size and shape of your heart and how well your heart's chambers and valves are working. This procedure takes approximately one hour. There are no restrictions for this procedure.  Your provider has recommended that  you wear a Zio Patch for 14 days.  This monitor will record your heart rhythm for our review.  IF you have any symptoms while wearing the monitor please press the button.  If you have any issues with the patch or you notice a red or orange light on it please call the company at [phone].  Once you remove the patch please mail it back to the company as soon as possible so we can get the results.   Referrals:  None  Special Instructions // Education:  Do the following things EVERYDAY: Weigh yourself in the morning before breakfast. Write it down and keep it in a log. Take your medicines as prescribed Eat low salt foods--Limit salt (sodium) to 2000 mg per day.  Stay as active as you can everyday Limit all fluids for the day to less than 2 liters   Follow-Up in: 4 months  At the [facility], you and your health needs are our priority. We have a designated team specialized in the treatment of Heart Failure. This Care Team includes your primary Heart Failure Specialized Cardiologist (physician), Advanced Practice Providers (APPs- Physician Assistants and Nurse Practitioners), and Pharmacist who all work together to provide you with the care you need, when you need it.   You may see any of the following providers on your designated Care Team at your next follow  up:  Dr Glori Bickers Dr Haynes Kerns, NP Lyda Jester, [place] [facility] [place], [place] Audry Riles, PharmD   Please be sure to bring in all your medications bottles to every appointment.   Need to Contact us:  If you have any questions or concerns before your next appointment please send [place] a message through [place] or call our office at [phone].    TO LEAVE A MESSAGE FOR THE NURSE SELECT OPTION 2, PLEASE LEAVE A MESSAGE INCLUDING: YOUR NAME DATE OF BIRTH CALL BACK NUMBER REASON FOR CALL**this is important as we prioritize the call backs  YOU WILL RECEIVE A CALL BACK THE SAME DAY AS LONG AS YOU CALL BEFORE 4:00 PM

## 2021-09-14 LAB — T3, FREE: T3, Free: 5.6 pg/mL — ABNORMAL HIGH (ref 2.0–4.4)

## 2021-09-16 ENCOUNTER — Other Ambulatory Visit (HOSPITAL_COMMUNITY): Payer: Self-pay | Admitting: *Deleted

## 2021-09-16 DIAGNOSIS — R634 Abnormal weight loss: Secondary | ICD-10-CM

## 2021-09-16 DIAGNOSIS — R7989 Other specified abnormal findings of blood chemistry: Secondary | ICD-10-CM

## 2021-09-16 DIAGNOSIS — I5042 Chronic combined systolic (congestive) and diastolic (congestive) heart failure: Secondary | ICD-10-CM

## 2021-09-16 NOTE — Progress Notes (Signed)
Ambulatory referral placed to endocrinology per Dr Haroldine Laws.   Left voicemail message regarding need for endocrinology referral for hyperthyroidism. Requested call back with any questions re: referral.   Emerson Monte RN [place] Coordinator  Office: [phone]  24/7 Pager: [phone]

## 2021-09-24 ENCOUNTER — Other Ambulatory Visit: Payer: Self-pay

## 2021-09-24 ENCOUNTER — Ambulatory Visit (HOSPITAL_COMMUNITY)
Admission: RE | Admit: 2021-09-24 | Discharge: 2021-09-24 | Disposition: A | Discharge status: Home / Self Care | Payer: Medicaid Other | Source: Ambulatory Visit | Attending: Internal Medicine | Admitting: Internal Medicine

## 2021-09-24 ENCOUNTER — Other Ambulatory Visit (HOSPITAL_COMMUNITY): Payer: Self-pay | Admitting: Internal Medicine

## 2021-09-24 DIAGNOSIS — I493 Ventricular premature depolarization: Secondary | ICD-10-CM

## 2021-10-04 ENCOUNTER — Ambulatory Visit (HOSPITAL_COMMUNITY)
Admission: RE | Admit: 2021-10-04 | Discharge: 2021-10-04 | Disposition: A | Discharge status: Home / Self Care | Payer: Medicaid Other | Source: Ambulatory Visit | Attending: Internal Medicine | Admitting: Internal Medicine

## 2021-10-04 ENCOUNTER — Other Ambulatory Visit: Payer: Self-pay

## 2021-10-04 DIAGNOSIS — Z951 Presence of aortocoronary bypass graft: Secondary | ICD-10-CM | POA: Insufficient documentation

## 2021-10-04 DIAGNOSIS — I081 Rheumatic disorders of both mitral and tricuspid valves: Secondary | ICD-10-CM | POA: Insufficient documentation

## 2021-10-04 DIAGNOSIS — I5042 Chronic combined systolic (congestive) and diastolic (congestive) heart failure: Secondary | ICD-10-CM

## 2021-10-04 LAB — ECHOCARDIOGRAM COMPLETE
AR max vel: 3.3 cm2
AV Peak grad: 4.2 mmHg
Ao pk vel: 1.02 m/s
Area-P 1/2: 4.6 cm2
Calc EF: 57.3 %
MV M vel: 3.49 m/s
MV Peak grad: 48.6 mmHg
S' Lateral: 3.8 cm
Single Plane A2C EF: 56.6 %
Single Plane A4C EF: 58.2 %

## 2021-10-22 ENCOUNTER — Other Ambulatory Visit: Payer: Self-pay

## 2021-10-23 ENCOUNTER — Other Ambulatory Visit: Payer: Self-pay

## 2021-10-25 ENCOUNTER — Encounter (HOSPITAL_COMMUNITY): Payer: Medicaid Other | Admitting: Internal Medicine

## 2021-11-05 ENCOUNTER — Other Ambulatory Visit: Payer: Self-pay

## 2021-11-18 ENCOUNTER — Ambulatory Visit: Payer: Self-pay | Admitting: Nurse Practitioner

## 2021-11-25 ENCOUNTER — Other Ambulatory Visit: Payer: Self-pay

## 2021-12-01 ENCOUNTER — Other Ambulatory Visit (HOSPITAL_COMMUNITY): Payer: Self-pay | Admitting: Family Medicine

## 2021-12-02 MED ORDER — LEVOTHYROXINE SODIUM 25 MCG PO TABS
25.0000 ug | ORAL_TABLET | Freq: Every day | ORAL | 1 refills | Status: DC
  Filled 2021-12-02: qty 30, 30d supply, fill #0
  Filled 2022-01-01: qty 30, 30d supply, fill #1

## 2021-12-03 ENCOUNTER — Other Ambulatory Visit: Payer: Self-pay

## 2021-12-08 ENCOUNTER — Other Ambulatory Visit: Payer: Self-pay | Admitting: Family Medicine

## 2021-12-08 DIAGNOSIS — R053 Chronic cough: Secondary | ICD-10-CM

## 2021-12-09 ENCOUNTER — Other Ambulatory Visit: Payer: Self-pay

## 2021-12-09 MED ORDER — CETIRIZINE HCL 10 MG PO TABS
10.0000 mg | ORAL_TABLET | Freq: Every day | ORAL | 0 refills | Status: DC
  Filled 2021-12-09: qty 30, 30d supply, fill #0

## 2021-12-23 ENCOUNTER — Other Ambulatory Visit: Payer: Self-pay

## 2022-01-01 ENCOUNTER — Other Ambulatory Visit: Payer: Self-pay

## 2022-01-02 ENCOUNTER — Other Ambulatory Visit (HOSPITAL_COMMUNITY): Payer: Self-pay

## 2022-01-05 ENCOUNTER — Other Ambulatory Visit: Payer: Self-pay | Admitting: Family Medicine

## 2022-01-05 DIAGNOSIS — R053 Chronic cough: Secondary | ICD-10-CM

## 2022-01-07 ENCOUNTER — Other Ambulatory Visit: Payer: Self-pay

## 2022-01-07 ENCOUNTER — Encounter: Payer: Self-pay | Admitting: Family Medicine

## 2022-01-07 ENCOUNTER — Ambulatory Visit: Payer: Medicaid Other | Attending: Family Medicine | Admitting: Family Medicine

## 2022-01-07 VITALS — BP 115/79 | HR 68 | Temp 98.2°F | Ht 72.0 in | Wt 182.4 lb

## 2022-01-07 DIAGNOSIS — Z1211 Encounter for screening for malignant neoplasm of colon: Secondary | ICD-10-CM

## 2022-01-07 DIAGNOSIS — I5022 Chronic systolic (congestive) heart failure: Secondary | ICD-10-CM

## 2022-01-07 DIAGNOSIS — R053 Chronic cough: Secondary | ICD-10-CM

## 2022-01-07 DIAGNOSIS — I493 Ventricular premature depolarization: Secondary | ICD-10-CM

## 2022-01-07 DIAGNOSIS — M25512 Pain in left shoulder: Secondary | ICD-10-CM

## 2022-01-07 DIAGNOSIS — E039 Hypothyroidism, unspecified: Secondary | ICD-10-CM | POA: Insufficient documentation

## 2022-01-07 DIAGNOSIS — Z1159 Encounter for screening for other viral diseases: Secondary | ICD-10-CM

## 2022-01-07 DIAGNOSIS — G8929 Other chronic pain: Secondary | ICD-10-CM

## 2022-01-07 MED ORDER — PREDNISONE 20 MG PO TABS
20.0000 mg | ORAL_TABLET | Freq: Every day | ORAL | 0 refills | Status: DC
  Filled 2022-01-07: qty 5, 5d supply, fill #0

## 2022-01-07 NOTE — Patient Instructions (Signed)

## 2022-01-07 NOTE — Progress Notes (Signed)
Subjective:  Patient ID: Jonathan King, male    DOB: [birth year]  Age: 56 y.o. MRN: [id number]  CC: Congestive Heart Failure   HPI Jonathan King is a 56 y.o. year old male with a history of three-vessel CAD status post CABG, CHF EF 50 to 55%, grade 1 DD from echo of 09/2021 20-25% from 02/2019, NYHA II seen today for a follow up visit.  Interval History: Last HF clinic visit was in 08/2021 at which time he was placed on a Zio patch.  Remote monitoring revealed nonsustained V. tach, isolated PACs and PVCs, ventricular bigeminy and trigeminy.  Amiodarone discontinued previously per cardiology notes and he is yet to restart it.  Cardiologist follow-up comes up in 1 week.  He Complains he is still coughing from time to time and is using a lot of cough drops. States he does not think it is allergies. He has no short of breath, wheezing or chest pain.  I ordered PFTs at his last visit but he is yet to undergo this.  His left shoulder has been hurting  with overhead motions for the last couple of months and he has decreased ROM.  Denies history of preceding trauma.  He is right-hand dominant.  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    Past Surgical History:  Procedure Laterality Date   CARDIAC SURGERY     CHOLECYSTECTOMY  01/31/2012   Procedure: LAPAROSCOPIC CHOLECYSTECTOMY;  Surgeon: Gayland Curry, MD,FACS;  Location: [facility];  Service: General;  Laterality: N/A;   CORONARY ARTERY BYPASS GRAFT N/A 12/21/2018   Procedure: CORONARY ARTERY BYPASS GRAFTING (CABG) x 3, USING LEFT INTERNAL MAMMARY ARTERY (LIMA) AND RIGHT GREATER SAPHENOUS VEIN (SVG) HARVESTED ENDOSCOPICALLY;    LIMA-LAD, SVG-RAMUS, SVG-DIAG;  Surgeon: Ivin Poot, MD;  Location: [facility];  Service: Open Heart Surgery;  Laterality: N/A;   RIGHT/LEFT HEART CATH AND CORONARY ANGIOGRAPHY N/A 12/16/2018   Procedure: RIGHT/LEFT HEART CATH AND CORONARY  ANGIOGRAPHY;  Surgeon: Jolaine Artist, MD;  Location: [facility];  Service: Cardiovascular;  Laterality: N/A;   TEE WITHOUT CARDIOVERSION N/A 12/21/2018   Procedure: TRANSESOPHAGEAL ECHOCARDIOGRAM (TEE);  Surgeon: Prescott Gum, Collier Salina, MD;  Location: [facility];  Service: Open Heart Surgery;  Laterality: N/A;    Family History  Problem Relation Age of Onset   Parkinson's disease Mother     Social History   Socioeconomic History   Marital status: Divorced    Spouse name: Not on file   Number of children: Not on file   Years of education: Not on file   Highest education level: Not on file  Occupational History   Not on file  Tobacco Use   Smoking status: Never   Smokeless tobacco: Never  Vaping Use   Vaping Use: Never used  Substance and Sexual Activity   Alcohol use: Yes    Comment: occ   Drug use: No   Sexual activity: Not on file  Other Topics Concern   Not on file  Social History Narrative   Not on file   Social Determinants of Health   Financial Resource Strain: Not on file  Food Insecurity: Not on file  Transportation Needs: Not on file  Physical Activity: Not on file  Stress: Not on file  Social Connections: Not on file    No Known Allergies  Outpatient Medications Prior to Visit  Medication Sig Dispense Refill   aspirin EC 81 MG tablet Take 1 tablet (81 mg total) by mouth daily. 30 tablet 11   atorvastatin (LIPITOR) 80 MG tablet Take 1 tablet (80 mg total) by mouth daily. 30 tablet 11   carvedilol (COREG) 3.125 MG tablet Take 1 tablet (3.125 mg total) by mouth 2 (two) times daily. 60 tablet 11   cetirizine (ZYRTEC) 10 MG tablet Take 1 tablet (10 mg total) by mouth daily. 30 tablet 0   levothyroxine (SYNTHROID) 25 MCG tablet Take 1 tablet (25 mcg total) by mouth daily before breakfast. 30 tablet 1   potassium chloride (KLOR-CON) 10 MEQ tablet Take 1 tablet (10 mEq total) by mouth daily. Needs appt for further refills 90 tablet 3   sacubitril-valsartan  (ENTRESTO) 24-26 MG Take 1 tablet by mouth 2 (two) times daily. 180 tablet 3   spironolactone (ALDACTONE) 25 MG tablet Take 1 tablet (25 mg total) by mouth daily. 90 tablet 3   dapagliflozin propanediol (FARXIGA) 5 MG TABS tablet Take 1 tablet (5 mg total) by mouth daily before breakfast. (Patient not taking: Reported on 01/07/2022) 30 tablet 11   No facility-administered medications prior to visit.     ROS Review of Systems  Constitutional:  Negative for activity change and appetite change.  HENT:  Negative for sinus pressure and sore throat.   Respiratory:  Negative for chest tightness, shortness of breath and wheezing.   Cardiovascular:  Negative for chest pain and palpitations.  Gastrointestinal:  Negative for abdominal distention, abdominal pain and constipation.  Genitourinary: Negative.   Musculoskeletal:        See HPI  Psychiatric/Behavioral:  Negative for behavioral problems and dysphoric mood.     Objective:  BP 115/79   Pulse 68   Temp 98.2 F (36.8 C) (Oral)   Ht 6' (1.829 m)   Wt 182 lb 6.4 oz (82.7 kg)   SpO2 97%   BMI 24.74 kg/m      01/07/2022    2:14 PM 09/13/2021    2:29 PM 07/03/2021    3:02 PM  BP/Weight  Systolic BP 709 628 366  Diastolic BP 79 60 73  Wt. (Lbs) 182.4 174.2 188.6  BMI 24.74 kg/m2 23.63 kg/m2 25.58 kg/m2      Physical Exam Constitutional:      Appearance: He is well-developed.  Cardiovascular:     Rate and Rhythm: Normal rate.     Heart sounds: Normal heart sounds. No murmur heard. Pulmonary:     Effort: Pulmonary effort is normal.     Breath sounds: Normal breath sounds. No wheezing or rales.  Chest:     Chest wall: No tenderness.  Abdominal:     General: Bowel sounds are normal. There is no distension.     Palpations: Abdomen is soft. There is no mass.     Tenderness: There is no abdominal tenderness.  Musculoskeletal:     Right lower leg: No edema.     Left lower leg: No edema.     Comments: Abduction of left arm  restricted to 120 degrees with associated tenderness No tenderness on palpation of anterior left shoulder joint Normal handgrip bilaterally  Neurological:     Mental Status: He is alert and oriented to person, place, and time.  Psychiatric:        Mood and Affect: Mood normal.        Latest Ref Rng & Units 09/13/2021    3:42 PM 06/27/2021    2:57  PM 05/16/2021    4:35 PM  CMP  Glucose 70 - 99 mg/dL 105  83  123   BUN 6 - 20 mg/dL 15  15  19    Creatinine 0.61 - 1.24 mg/dL 0.81  1.09  0.98   Sodium 135 - 145 mmol/L 138  139  139   Potassium 3.5 - 5.1 mmol/L 4.4  3.8  3.7   Chloride 98 - 111 mmol/L 105  104  102   CO2 22 - 32 mmol/L 24  26  23    Calcium 8.9 - 10.3 mg/dL 9.4  8.8  9.4   Total Protein 6.5 - 8.1 g/dL 7.6  7.3  7.5   Total Bilirubin 0.3 - 1.2 mg/dL 0.7  0.8  0.7   Alkaline Phos 38 - 126 U/L 109  101  92   AST 15 - 41 U/L 28  26  80   ALT 0 - 44 U/L 37  26  93     Lipid Panel     Component Value Date/Time   CHOL 77 09/13/2021 1542   TRIG 103 09/13/2021 1542   HDL 28 (L) 09/13/2021 1542   CHOLHDL 2.8 09/13/2021 1542   VLDL 21 09/13/2021 1542   LDLCALC 28 09/13/2021 1542    CBC    Component Value Date/Time   WBC 7.4 09/13/2021 1542   RBC 5.19 09/13/2021 1542   HGB 15.6 09/13/2021 1542   HCT 46.2 09/13/2021 1542   PLT 270 09/13/2021 1542   MCV 89.0 09/13/2021 1542   MCH 30.1 09/13/2021 1542   MCHC 33.8 09/13/2021 1542   RDW 11.9 09/13/2021 1542   LYMPHSABS 2.3 12/19/2018 0555   MONOABS 0.8 12/19/2018 0555   EOSABS 0.3 12/19/2018 0555   BASOSABS 0.1 12/19/2018 0555    Lab Results  Component Value Date   HGBA1C 5.7 (A) 07/03/2021    Lab Results  Component Value Date   TSH 0.023 (L) 09/13/2021    Assessment & Plan:  1. Chronic HFrEF (heart failure with reduced ejection fraction) (HCC) EF of 50 to 55% improved from 20 to 25% previously Euvolemic Continue Entresto, beta-blocker, Aldactone, [place] Keep upcoming appointment with CHF clinic -  Basic Metabolic Panel  2. Screening for colon cancer - Fecal occult blood, imunochemical([facility])  3. Acquired hypothyroidism Last TSH was suppressed We will check thyroid labs and adjust regimen accordingly - T3, free - T4, free - TSH  4. Screening for viral disease - HCV Ab w Reflex to Quant PCR  5. Chronic left shoulder pain Uncontrolled Suspicious for rotator cuff tendinopathy We will refer for PT and if uncontrolled consider imaging and orthopedic referral - predniSONE (DELTASONE) 20 MG tablet; Take 1 tablet (20 mg total) by mouth daily with breakfast.  Dispense: 5 tablet; Refill: 0 - Ambulatory referral to Physical Therapy  6. PVCs Currently in sinus rhythm He has been off amiodarone Zio patch reading revealed isolated PACs isolated PVCs, 12 runs of nonsustained V. tach, ventricular bigeminy and trigeminy Follow-up with cardiology  7.  Chronic cough Uncontrolled on Zyrtec Chest x-ray from 09/2020 was unrevealing PFTs ordered in 06/2021 but this is yet to be done I will have my CMA call the lab to follow-up on his PFT Meds ordered this encounter  Medications   predniSONE (DELTASONE) 20 MG tablet    Sig: Take 1 tablet (20 mg total) by mouth daily with breakfast.    Dispense:  5 tablet    Refill:  0    Follow-up:  Return in about 6 months (around 07/08/2022) for Chronic medical conditions.       Charlott Rakes, MD, FAAFP. [facility] and [place], [place]   01/07/2022, 2:31 PM

## 2022-01-08 ENCOUNTER — Other Ambulatory Visit: Payer: Self-pay | Admitting: Family Medicine

## 2022-01-08 ENCOUNTER — Telehealth: Payer: Medicaid Other | Admitting: Family Medicine

## 2022-01-08 ENCOUNTER — Other Ambulatory Visit: Payer: Self-pay

## 2022-01-08 DIAGNOSIS — R053 Chronic cough: Secondary | ICD-10-CM

## 2022-01-08 DIAGNOSIS — G8929 Other chronic pain: Secondary | ICD-10-CM

## 2022-01-08 LAB — T3, FREE: T3, Free: 2.3 pg/mL (ref 2.0–4.4)

## 2022-01-08 LAB — BASIC METABOLIC PANEL
BUN/Creatinine Ratio: 18 (ref 9–20)
BUN: 18 mg/dL (ref 6–24)
CO2: 25 mmol/L (ref 20–29)
Calcium: 9 mg/dL (ref 8.7–10.2)
Chloride: 100 mmol/L (ref 96–106)
Creatinine, Ser: 0.98 mg/dL (ref 0.76–1.27)
Glucose: 95 mg/dL (ref 70–99)
Potassium: 4.5 mmol/L (ref 3.5–5.2)
Sodium: 138 mmol/L (ref 134–144)
eGFR: 91 mL/min/{1.73_m2} (ref >59)

## 2022-01-08 LAB — TSH: TSH: 20.9 u[IU]/mL — ABNORMAL HIGH (ref 0.450–4.500)

## 2022-01-08 LAB — T4, FREE: Free T4: 0.58 ng/dL — ABNORMAL LOW (ref 0.82–1.77)

## 2022-01-08 LAB — HCV AB W REFLEX TO QUANT PCR: HCV Ab: NONREACTIVE

## 2022-01-08 LAB — HCV INTERPRETATION

## 2022-01-08 MED ORDER — LEVOTHYROXINE SODIUM 50 MCG PO TABS
50.0000 ug | ORAL_TABLET | Freq: Every day | ORAL | 3 refills | Status: DC
  Filled 2022-01-08: qty 30, 30d supply, fill #0
  Filled 2022-03-07: qty 30, 30d supply, fill #1
  Filled 2022-04-11: qty 30, 30d supply, fill #2
  Filled 2022-05-18: qty 30, 30d supply, fill #3

## 2022-01-08 MED ORDER — CETIRIZINE HCL 10 MG PO TABS
10.0000 mg | ORAL_TABLET | Freq: Every day | ORAL | 3 refills | Status: DC
  Filled 2022-01-08: qty 30, 30d supply, fill #0
  Filled 2022-02-07: qty 30, 30d supply, fill #1
  Filled 2022-03-09: qty 30, 30d supply, fill #2
  Filled 2022-04-06: qty 30, 30d supply, fill #3

## 2022-01-10 ENCOUNTER — Other Ambulatory Visit: Payer: Self-pay

## 2022-01-10 NOTE — Telephone Encounter (Signed)
Patient called in about why med, Prednisone was denied. It says refill not appropiate. Please call back with further details.

## 2022-01-13 ENCOUNTER — Ambulatory Visit (HOSPITAL_COMMUNITY)
Admission: RE | Admit: 2022-01-13 | Discharge: 2022-01-13 | Disposition: A | Discharge status: Home / Self Care | Payer: Medicaid Other | Source: Ambulatory Visit | Attending: Family Medicine | Admitting: Family Medicine

## 2022-01-13 ENCOUNTER — Other Ambulatory Visit: Payer: Self-pay

## 2022-01-13 DIAGNOSIS — Z79899 Other long term (current) drug therapy: Secondary | ICD-10-CM | POA: Insufficient documentation

## 2022-01-13 DIAGNOSIS — R053 Chronic cough: Secondary | ICD-10-CM | POA: Insufficient documentation

## 2022-01-13 LAB — PULMONARY FUNCTION TEST
DL/VA % pred: 60 %
DL/VA: 2.59 ml/min/mmHg/L
DLCO unc % pred: 53 %
DLCO unc: 16.13 ml/min/mmHg
FEF 25-75 Post: 4.56 L/sec
FEF 25-75 Pre: 4.15 L/sec
FEF2575-%Change-Post: 9 %
FEF2575-%Pred-Post: 133 %
FEF2575-%Pred-Pre: 121 %
FEV1-%Change-Post: 2 %
FEV1-%Pred-Post: 105 %
FEV1-%Pred-Pre: 102 %
FEV1-Post: 4.24 L
FEV1-Pre: 4.13 L
FEV1FVC-%Change-Post: 3 %
FEV1FVC-%Pred-Pre: 103 %
FEV6-%Change-Post: 0 %
FEV6-%Pred-Post: 102 %
FEV6-%Pred-Pre: 103 %
FEV6-Post: 5.2 L
FEV6-Pre: 5.22 L
FEV6FVC-%Change-Post: 0 %
FEV6FVC-%Pred-Post: 104 %
FEV6FVC-%Pred-Pre: 104 %
FVC-%Change-Post: 0 %
FVC-%Pred-Post: 98 %
FVC-%Pred-Pre: 99 %
FVC-Post: 5.2 L
FVC-Pre: 5.22 L
Post FEV1/FVC ratio: 81 %
Post FEV6/FVC ratio: 100 %
Pre FEV1/FVC ratio: 79 %
Pre FEV6/FVC Ratio: 100 %
RV % pred: 102 %
RV: 2.31 L
TLC % pred: 97 %
TLC: 7.21 L

## 2022-01-13 MED ORDER — ALBUTEROL SULFATE (2.5 MG/3ML) 0.083% IN NEBU
2.5000 mg | INHALATION_SOLUTION | Freq: Once | RESPIRATORY_TRACT | Status: AC
  Administered 2022-01-13: 2.5 mg via RESPIRATORY_TRACT

## 2022-01-13 NOTE — Telephone Encounter (Addendum)
Left message on voicemail to return call.  Would like to know why patient request medication after taking regiment prescribed by Dr. Margarita Rana.   For continued pain in L. shoulder? PT referral was placed.  Per Dr. Margarita Rana OV note from 01/07/2022 5. Chronic left shoulder pain Uncontrolled Suspicious for rotator cuff tendinopathy We will refer for PT and if uncontrolled consider imaging and orthopedic referral - predniSONE (DELTASONE) 20 MG tablet; Take 1 tablet (20 mg total) by mouth daily with breakfast.  Dispense: 5 tablet; Refill: 0 - Ambulatory referral to Physical Therapy  Placed in [place]  [address], [place]  [address] PH# [phone]

## 2022-01-14 ENCOUNTER — Other Ambulatory Visit: Payer: Self-pay | Admitting: Family Medicine

## 2022-01-14 DIAGNOSIS — R053 Chronic cough: Secondary | ICD-10-CM

## 2022-01-15 ENCOUNTER — Encounter (HOSPITAL_COMMUNITY): Payer: Self-pay | Admitting: Internal Medicine

## 2022-01-15 ENCOUNTER — Ambulatory Visit (HOSPITAL_COMMUNITY)
Admission: RE | Admit: 2022-01-15 | Discharge: 2022-01-15 | Disposition: A | Discharge status: Home / Self Care | Payer: Medicaid Other | Source: Ambulatory Visit | Attending: Internal Medicine | Admitting: Internal Medicine

## 2022-01-15 ENCOUNTER — Other Ambulatory Visit: Payer: Self-pay

## 2022-01-15 VITALS — BP 110/70 | HR 89 | Wt 182.4 lb

## 2022-01-15 DIAGNOSIS — I5042 Chronic combined systolic (congestive) and diastolic (congestive) heart failure: Secondary | ICD-10-CM

## 2022-01-15 DIAGNOSIS — I480 Paroxysmal atrial fibrillation: Secondary | ICD-10-CM

## 2022-01-15 DIAGNOSIS — I4510 Unspecified right bundle-branch block: Secondary | ICD-10-CM | POA: Insufficient documentation

## 2022-01-15 DIAGNOSIS — Z951 Presence of aortocoronary bypass graft: Secondary | ICD-10-CM | POA: Insufficient documentation

## 2022-01-15 DIAGNOSIS — I5022 Chronic systolic (congestive) heart failure: Secondary | ICD-10-CM | POA: Insufficient documentation

## 2022-01-15 DIAGNOSIS — R942 Abnormal results of pulmonary function studies: Secondary | ICD-10-CM

## 2022-01-15 DIAGNOSIS — I4891 Unspecified atrial fibrillation: Secondary | ICD-10-CM | POA: Insufficient documentation

## 2022-01-15 DIAGNOSIS — Z9049 Acquired absence of other specified parts of digestive tract: Secondary | ICD-10-CM | POA: Insufficient documentation

## 2022-01-15 DIAGNOSIS — Z79899 Other long term (current) drug therapy: Secondary | ICD-10-CM | POA: Insufficient documentation

## 2022-01-15 DIAGNOSIS — Z7982 Long term (current) use of aspirin: Secondary | ICD-10-CM | POA: Insufficient documentation

## 2022-01-15 DIAGNOSIS — R0602 Shortness of breath: Secondary | ICD-10-CM

## 2022-01-15 DIAGNOSIS — I2510 Atherosclerotic heart disease of native coronary artery without angina pectoris: Secondary | ICD-10-CM

## 2022-01-15 DIAGNOSIS — Z7984 Long term (current) use of oral hypoglycemic drugs: Secondary | ICD-10-CM | POA: Insufficient documentation

## 2022-01-15 DIAGNOSIS — I493 Ventricular premature depolarization: Secondary | ICD-10-CM

## 2022-01-15 DIAGNOSIS — R053 Chronic cough: Secondary | ICD-10-CM

## 2022-01-15 LAB — CBC
HCT: 42.1 % (ref 39.0–52.0)
Hemoglobin: 14.2 g/dL (ref 13.0–17.0)
MCH: 29.1 pg (ref 26.0–34.0)
MCHC: 33.7 g/dL (ref 30.0–36.0)
MCV: 86.3 fL (ref 80.0–100.0)
Platelets: 282 10*3/uL (ref 150–400)
RBC: 4.88 MIL/uL (ref 4.22–5.81)
RDW: 14.5 % (ref 11.5–15.5)
WBC: 13.6 10*3/uL — ABNORMAL HIGH (ref 4.0–10.5)
nRBC: 0 % (ref 0.0–0.2)

## 2022-01-15 LAB — COMPREHENSIVE METABOLIC PANEL
ALT: 29 U/L (ref 0–44)
AST: 25 U/L (ref 15–41)
Albumin: 3.9 g/dL (ref 3.5–5.0)
Alkaline Phosphatase: 111 U/L (ref 38–126)
Anion gap: 6 (ref 5–15)
BUN: 16 mg/dL (ref 6–20)
CO2: 24 mmol/L (ref 22–32)
Calcium: 8.9 mg/dL (ref 8.9–10.3)
Chloride: 104 mmol/L (ref 98–111)
Creatinine, Ser: 0.95 mg/dL (ref 0.61–1.24)
GFR, Estimated: >60 mL/min (ref >60)
Glucose, Bld: 121 mg/dL — ABNORMAL HIGH (ref 70–99)
Potassium: 4.2 mmol/L (ref 3.5–5.1)
Sodium: 134 mmol/L — ABNORMAL LOW (ref 135–145)
Total Bilirubin: 0.6 mg/dL (ref 0.3–1.2)
Total Protein: 7.9 g/dL (ref 6.5–8.1)

## 2022-01-15 LAB — BRAIN NATRIURETIC PEPTIDE: B Natriuretic Peptide: 149.7 pg/mL — ABNORMAL HIGH (ref 0.0–100.0)

## 2022-01-15 MED ORDER — MEXILETINE HCL 200 MG PO CAPS
200.0000 mg | ORAL_CAPSULE | Freq: Two times a day (BID) | ORAL | 3 refills | Status: DC
  Filled 2022-01-15: qty 60, 30d supply, fill #0
  Filled 2022-02-09: qty 60, 30d supply, fill #1
  Filled 2022-03-09: qty 60, 30d supply, fill #2
  Filled 2022-04-13: qty 60, 30d supply, fill #3
  Filled 2022-05-14: qty 60, 30d supply, fill #4
  Filled 2022-06-08: qty 60, 30d supply, fill #5
  Filled 2022-07-06: qty 60, 30d supply, fill #6

## 2022-01-15 NOTE — Addendum Note (Signed)
Encounter addended by: Jerl Mina, RN on: 01/15/2022 3:38 PM  Actions taken: Visit diagnoses modified, Charge Capture section accepted, Order list changed, Diagnosis association updated, Clinical Note Signed

## 2022-01-15 NOTE — Patient Instructions (Addendum)
Start Mexilitine 200mg  Twice daily  Labs done today, your results will be available in MyChart, we will contact you for abnormal readings.   You provider has ordered a CT of the chest. ONCE APPROVED [place]. YOU WILL BE CALLED TO ARRANGE THE TEST.  You have been referred to Pulmonology. They will call you to arrange your appointment.  Your physician recommends that you schedule a follow-up appointment in: 6 months ( March 2024)  ** please call the office in January to arrange your follow up appointment **  If you have any questions or concerns before your next appointment please send [place] a message through [place] or call our office at [phone].    TO LEAVE A MESSAGE FOR THE NURSE SELECT OPTION 2, PLEASE LEAVE A MESSAGE INCLUDING: YOUR NAME DATE OF BIRTH CALL BACK NUMBER REASON FOR CALL**this is important as we prioritize the call backs  YOU WILL RECEIVE A CALL BACK THE SAME DAY AS LONG AS YOU CALL BEFORE 4:00 PM  At the [facility], you and your health needs are our priority. As part of our continuing mission to provide you with exceptional heart care, we have created designated Provider Care Teams. These Care Teams include your primary Cardiologist (physician) and Advanced Practice Providers (APPs- Physician Assistants and Nurse Practitioners) who all work together to provide you with the care you need, when you need it.   You may see any of the following providers on your designated Care Team at your next follow up: Dr Glori Bickers Dr Loralie Champagne Dr. Roxana Hires, NP Lyda Jester, [place] [facility] [place], [place] Forestine Na, NP Audry Riles, PharmD   Please be sure to bring in all your medications bottles to every appointment.

## 2022-01-15 NOTE — Progress Notes (Signed)
ADVANCED HF CLINIC NOTE  Primary HF Cardiologist: Dr Haroldine Laws CT Surgery: Dr Prescott Gum PCP: Dr. 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   Admitted with increase shortness of breath. Echo 12/11/18 LVEF 15-20% with moderate RV dysfunction .Chest CT 8/17  with suggestion of severe CAD with totally occluded LAD and high-grade ramus lesion. Also LLL nodule suggestive of lung CA (versus infection). He underwent CABG x3 on 12/21/18. Post operative course was uneventful. He was discharged to home with [facility] on 12/30/2018.   Had follow up with CT surgery. Plan for CT scan of chest to reassess possible mass in a couple of months. Amio was stopped at that visit.   CT chest 11/20 resolution of infiltrate/nodule  At last visit was having frequent PVCs. Zio placed.   Zio 6/23 1. Sinus rhythm - avg HR of 75 bpm.  2. 12 runs if nonsustained Ventricular Tachycardia occurred, the run with the fastest interval lasting 4 beats with a max rate of 176 bpm, the longest lasting 5 beats with an avg rate of 132 bpm. 3. Isolated PACs were occasional (1.3%, 8344) 4.  Isolated PVCs were frequent (8.5%, 55250) with two promary morphologies (5.9%, 2,7%) 5. Ventricular Bigeminy and Trigeminy were present. 6. No patient-triggered events were present.   Recently had PFTs for cough FEV1 4.13 L (102%) FVC 5.22  L (99%) DLCO 53%  Here for f/u. Says he feels pretty good. Denies CP or SOB. No edema. Recently had PFts for chronic cough and found to have low DLCO. Gets dizzy sometimes if he stands up too fast. No syncope. Stopped [facility] [place] due to cost.    Cardiac Studies: Echo 01/22: EF 45-50%, RV okay, mild to moderate MR. Echo 2/21: 30-35%, RV mildly reduced. Echo 11/2018: EF 15%. RV moderately reduced.   RHC/LHC 12/16/18 Ao = 88/51 (68) LV = 86/17 RA = 3 RV = 46/5 PA = 45/10 (23) PCW = 17 Fick  cardiac output/index = 4.2/2.0 PVR = 1.5 WU Ao sat = 98% PA sat = 67%, 68%   Assessment: 1. 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 Well-compensated filling pressures with moderately reduced CO  ROS: All systems negative except as listed in HPI, PMH and Problem List.  SH:  Social History   Socioeconomic History   Marital status: Divorced    Spouse name: Not on file   Number of children: Not on file   Years of education: Not on file   Highest education level: Not on file  Occupational History   Not on file  Tobacco Use   Smoking status: Never   Smokeless tobacco: Never  Vaping Use   Vaping Use: Never used  Substance and Sexual Activity   Alcohol use: Yes    Comment: occ   Drug use: No   Sexual activity: Not on file  Other Topics Concern   Not on file  Social History Narrative   Not on file   Social Determinants of Health   Financial Resource Strain: Not on file  Food Insecurity: Not on file  Transportation Needs: Not on file  Physical Activity: Not on file  Stress: Not on file  Social Connections: Not on file  Intimate Partner Violence: Not on file    FH:  Family History  Problem Relation Age of Onset   Parkinson's  disease Mother     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    Current Outpatient Medications  Medication Sig Dispense Refill   aspirin EC 81 MG tablet Take 1 tablet (81 mg total) by mouth daily. 30 tablet 11   atorvastatin (LIPITOR) 80 MG tablet Take 1 tablet (80 mg total) by mouth daily. 30 tablet 11   carvedilol (COREG) 3.125 MG tablet Take 1 tablet (3.125 mg total) by mouth 2 (two) times daily. 60 tablet 11   cetirizine (ZYRTEC) 10 MG tablet Take 1 tablet (10 mg total) by mouth daily. 30 tablet 3   levothyroxine (SYNTHROID) 50 MCG tablet Take 1 tablet (50 mcg total) by mouth daily before breakfast. 30  tablet 3   potassium chloride (KLOR-CON) 10 MEQ tablet Take 1 tablet (10 mEq total) by mouth daily. Needs appt for further refills 90 tablet 3   predniSONE (DELTASONE) 20 MG tablet Take 1 tablet (20 mg total) by mouth daily with breakfast. 5 tablet 0   sacubitril-valsartan (ENTRESTO) 24-26 MG Take 1 tablet by mouth 2 (two) times daily. 180 tablet 3   spironolactone (ALDACTONE) 25 MG tablet Take 1 tablet (25 mg total) by mouth daily. 90 tablet 3   dapagliflozin propanediol (FARXIGA) 5 MG TABS tablet Take 1 tablet (5 mg total) by mouth daily before breakfast. (Patient not taking: Reported on 01/07/2022) 30 tablet 11   No current facility-administered medications for this encounter.    Vitals:   01/15/22 1458  BP: 110/70  Pulse: 89  SpO2: 96%  Weight: 82.7 kg (182 lb 6.4 oz)    Wt Readings from Last 3 Encounters:  01/15/22 82.7 kg (182 lb 6.4 oz)  01/07/22 82.7 kg (182 lb 6.4 oz)  09/13/21 79 kg (174 lb 3.2 oz)    PHYSICAL EXAM: General:  Well appearing. No resp difficulty HEENT: normal Neck: supple. no JVD. Carotids 2+ bilat; no bruits. No lymphadenopathy or thryomegaly appreciated. Cor: PMI nondisplaced. Irregular rate & rhythm. No rubs, gallops or murmurs. Lungs: decreased  Abdomen: soft, nontender, nondistended. No hepatosplenomegaly. No bruits or masses. Good bowel sounds. Extremities: no cyanosis, clubbing, rash, edema Neuro: alert & orientedx3, cranial nerves grossly intact. moves all 4 extremities w/o difficulty. Affect pleasant   ECG: NSR 86 with frequent PVCs in pattern of bigemine RBBB Personally reviewed  ASSESSMENT & PLAN:  1. CAD  - s/p CABG x3 on 12/21/18 - Continue ASA and statin - No s/s angina - Continue ASA, statin & b-blocker  2. Post Operative Atrial Fibrillation:  - In NSR today. No recurrence   3. Chronic Systolic CHF, ICM : - Echo 8/20 EF 20-25%  - Echo 11/20 EF 20-25% - Echo 2/21 EF 30-35% - Echo 01/22 EF 45-50%, RV okay, mild to moderate MR -  Echo 6/23 EF 50-55% - NYHA II. Volume status stable. He does not need lasix.  - Continue Entresto 24/26 bid - Continue 3.125 mg carvedilol bid  - Continue 25 spiro mg daily - Off Farxiga due to cost - Labs today   4. High Burden PVCs:  ~ 20% burden initially. Suspect likely contributing to cardiomyopathy.  - suppressed on amio - Amio stopped 01/26/19. PVCs restarted 11/20 - Zio 2/21 no AF, mostly in SR w/ 1st degree HB, some ventricular bigeminy and trigeminy present - Off amio as of 1/23 - Zio 6/23: Isolated PVCs were frequent (8.5%, 55250) with  two promary morphologies (5.9%, 2,7%) - start mexilitene  5. Decreased DLCO - will check hires CT and refer to Pulmonary    Glori Bickers MD 3:24 PM

## 2022-01-16 ENCOUNTER — Other Ambulatory Visit: Payer: Self-pay

## 2022-01-20 ENCOUNTER — Other Ambulatory Visit: Payer: Self-pay

## 2022-01-29 ENCOUNTER — Ambulatory Visit (HOSPITAL_COMMUNITY)
Admission: RE | Admit: 2022-01-29 | Discharge: 2022-01-29 | Disposition: A | Discharge status: Home / Self Care | Payer: Medicaid Other | Source: Ambulatory Visit | Attending: Internal Medicine | Admitting: Internal Medicine

## 2022-01-29 DIAGNOSIS — R0602 Shortness of breath: Secondary | ICD-10-CM | POA: Insufficient documentation

## 2022-01-29 DIAGNOSIS — I5042 Chronic combined systolic (congestive) and diastolic (congestive) heart failure: Secondary | ICD-10-CM | POA: Insufficient documentation

## 2022-02-07 ENCOUNTER — Other Ambulatory Visit: Payer: Self-pay

## 2022-02-07 ENCOUNTER — Ambulatory Visit (INDEPENDENT_AMBULATORY_CARE_PROVIDER_SITE_OTHER): Payer: Self-pay | Admitting: Pulmonary Disease

## 2022-02-07 ENCOUNTER — Encounter: Payer: Self-pay | Admitting: Pulmonary Disease

## 2022-02-07 VITALS — BP 108/70 | HR 90 | Ht 72.0 in | Wt 182.4 lb

## 2022-02-07 DIAGNOSIS — R053 Chronic cough: Secondary | ICD-10-CM

## 2022-02-07 NOTE — Patient Instructions (Signed)
Full PFT in 6 months  Repeat high-resolution CT scan of the chest in 6 months  Blood work including ANA, ESR, CRP, rheumatoid factor, anticyclic Citrullinated peptide, myositis panel, antisynthetase.  I will see you 6 months from here  Call with significant concerns

## 2022-02-07 NOTE — Progress Notes (Signed)
Jonathan King    [id number]    July 14, 1965  Primary Care Physician:Newlin, Charlane Ferretti, MD  Referring Physician: Charlott Rakes, MD [place],  [place] [address]  Chief complaint:   History of cough  HPI:  Cough of a few months duration, states probably about 3 to 4 months Cough is usually nonproductive Cough drops makes of breath better  Cough is not productive  Patient had had a recent CT scan, high-resolution CT showing what appears to be ILD  Denies any history of chronic lung disease Denies any significant shortness of breath Denies reflux Denies any nasal stuffiness or congestion Denies any joint pain or change in joint discomfort No recent skin rash  Never smoker  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   atorvastatin (LIPITOR) 80 MG tablet Take 1 tablet (80 mg total) by mouth daily.   carvedilol (COREG) 3.125 MG tablet Take 1 tablet (3.125 mg total) by mouth 2 (two) times daily.   cetirizine (ZYRTEC) 10 MG tablet Take 1 tablet (10 mg total) by mouth daily. (Patient not taking: Reported on 02/07/2022)   dapagliflozin propanediol (FARXIGA) 5 MG TABS tablet Take 1 tablet (5 mg total) by mouth daily before breakfast. (Patient not taking: Reported on 01/07/2022)   levothyroxine (SYNTHROID) 50 MCG tablet Take 1 tablet (50 mcg total) by mouth daily before breakfast.   mexiletine (MEXITIL) 200 MG capsule Take 1 capsule (200 mg total) by mouth 2 (two) times daily.   potassium chloride (KLOR-CON) 10 MEQ tablet Take 1 tablet (10 mEq total) by mouth daily. Needs appt for further refills   predniSONE (DELTASONE) 20 MG tablet Take 1 tablet (20 mg total) by mouth daily with breakfast. (Patient not taking: Reported on 02/07/2022)   sacubitril-valsartan  (ENTRESTO) 24-26 MG Take 1 tablet by mouth 2 (two) times daily.   spironolactone (ALDACTONE) 25 MG tablet Take 1 tablet (25 mg total) by mouth daily.   No facility-administered encounter medications on file as of 02/07/2022.    Allergies as of 02/07/2022   (No Known Allergies)    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    Past Surgical History:  Procedure Laterality Date   CARDIAC SURGERY     CHOLECYSTECTOMY  01/31/2012   Procedure: LAPAROSCOPIC CHOLECYSTECTOMY;  Surgeon: Gayland Curry, MD,FACS;  Location: [facility];  Service: General;  Laterality: N/A;   CORONARY ARTERY BYPASS GRAFT N/A 12/21/2018   Procedure: CORONARY ARTERY BYPASS GRAFTING (CABG) x 3, USING LEFT INTERNAL MAMMARY ARTERY (LIMA) AND RIGHT GREATER SAPHENOUS VEIN (SVG) HARVESTED ENDOSCOPICALLY;    LIMA-LAD, SVG-RAMUS, SVG-DIAG;  Surgeon: Ivin Poot, MD;  Location: [facility];  Service: Open Heart Surgery;  Laterality: N/A;   RIGHT/LEFT HEART CATH AND CORONARY ANGIOGRAPHY N/A 12/16/2018   Procedure: RIGHT/LEFT HEART CATH AND CORONARY ANGIOGRAPHY;  Surgeon: Jolaine Artist, MD;  Location: [facility];  Service: Cardiovascular;  Laterality: N/A;   TEE WITHOUT CARDIOVERSION N/A 12/21/2018   Procedure: TRANSESOPHAGEAL ECHOCARDIOGRAM (TEE);  Surgeon: Prescott Gum, Collier Salina, MD;  Location: [facility];  Service: Open Heart Surgery;  Laterality: N/A;    Family History  Problem Relation Age of Onset   Parkinson's disease Mother     Social History   Socioeconomic History   Marital status: Divorced    Spouse name: Not on file   Number of children: Not on file   Years of education: Not on file   Highest education level: Not on file  Occupational History   Not on file  Tobacco Use   Smoking status: Never   Smokeless tobacco: Never  Vaping Use   Vaping Use: Never used  Substance and Sexual Activity   Alcohol use: Yes    Comment: occ   Drug  use: No   Sexual activity: Not on file  Other Topics Concern   Not on file  Social History Narrative   Not on file   Social Determinants of Health   Financial Resource Strain: Not on file  Food Insecurity: Not on file  Transportation Needs: Not on file  Physical Activity: Not on file  Stress: Not on file  Social Connections: Not on file  Intimate Partner Violence: Not on file    Review of Systems  Constitutional:  Negative for fatigue.  Respiratory:  Positive for cough.     Vitals:   02/07/22 1419  BP: 108/70  Pulse: 90  SpO2: 95%     Physical Exam Constitutional:      Appearance: Normal appearance.  HENT:     Head: Normocephalic.     Mouth/Throat:     Mouth: Mucous membranes are moist.  Cardiovascular:     Rate and Rhythm: Normal rate and regular rhythm.     Heart sounds: No murmur heard.    No friction rub.  Pulmonary:     Effort: No respiratory distress.     Breath sounds: No stridor. No wheezing or rhonchi.  Musculoskeletal:     Cervical back: No rigidity or tenderness.  Neurological:     Mental Status: He is alert.  Psychiatric:        Mood and Affect: Mood normal.    Data Reviewed: High-resolution CT scan of the chest was reviewed with the patient and compared with one performed about 3 years ago  Pulmonary function test reviewed showing reduced diffusing capacity  Assessment:  Interstitial lung disease  Pulmonary fibrosis  Chronic cough  Denies significant shortness of breath or any other limitation at present  Abnormal CT scan of the chest  Plan/Recommendations: Work-up for ILD including ESR, CRP, antisynthetase, myositis panel, ANA, anticyclic Citrullinated peptide  Repeat CT scan of the chest-high-resolution CT in 6 months  Follow-up on PFT in 6 months  Encouraged to give [place] a call with any significant concerns  Causes and management options for scarring of the lungs discussed with the patient  he appears to not have significant  symptoms apart from a cough Its manageable at present -Need to monitor for progression of significant symptoms    Sherrilyn Rist MD [facility] 02/07/2022, 3:00 PM  CC: Charlott Rakes, MD

## 2022-02-10 ENCOUNTER — Other Ambulatory Visit: Payer: Self-pay

## 2022-02-10 ENCOUNTER — Telehealth (HOSPITAL_COMMUNITY): Payer: Self-pay

## 2022-02-10 DIAGNOSIS — R0602 Shortness of breath: Secondary | ICD-10-CM

## 2022-02-10 NOTE — Addendum Note (Signed)
Addended by: Jerl Mina on: 02/10/2022 10:17 AM   Modules accepted: Orders

## 2022-02-10 NOTE — Telephone Encounter (Addendum)
Pt aware, agreeable, and verbalized understanding   Referral sent   ----- Message from Jolaine Artist, MD sent at 02/09/2022  1:17 PM EDT ----- Please refer to Dr. Chase Caller to evaluate for potential ILD

## 2022-02-11 ENCOUNTER — Other Ambulatory Visit: Payer: Self-pay

## 2022-02-11 LAB — ANTI-NUCLEAR AB-TITER (ANA TITER): ANA Titer 1: 1:80 {titer} — ABNORMAL HIGH

## 2022-02-11 LAB — C-REACTIVE PROTEIN: CRP: 0.3 mg/L (ref <8.0)

## 2022-02-11 LAB — SEDIMENTATION RATE: Sed Rate: 9 mm/h (ref 0–20)

## 2022-02-11 LAB — ANA: Anti Nuclear Antibody (ANA): POSITIVE — AB

## 2022-02-11 LAB — RHEUMATOID FACTOR: Rheumatoid fact SerPl-aCnc: <14 IU/mL (ref <14)

## 2022-02-11 LAB — CYCLIC CITRUL PEPTIDE ANTIBODY, IGG: Cyclic Citrullin Peptide Ab: <16 UNITS

## 2022-02-12 ENCOUNTER — Other Ambulatory Visit: Payer: Self-pay

## 2022-02-13 LAB — ANTI-JO-1 AB (RDL): Anti-Jo-1 Ab (RDL): <20 Units (ref <20)

## 2022-02-14 LAB — MYOSITIS SPECIFIC II ANTIBODIES PANEL
EJ AB: <11 SI (ref <11)
JO-1 AB: <11 SI (ref <11)
MDA-5 AB: <11 SI (ref <11)
MI-2 ALPHA AB: <11 SI (ref <11)
MI-2 BETA AB: <11 SI (ref <11)
NXP-2 AB: <11 SI (ref <11)
OJ AB: <11 SI (ref <11)
PL-12 AB: <11 SI (ref <11)
PL-7 AB: <11 SI (ref <11)
SRP-AB: <11 SI (ref <11)
TIF-1y AB: <11 SI (ref <11)

## 2022-02-17 ENCOUNTER — Other Ambulatory Visit: Payer: Self-pay

## 2022-03-07 ENCOUNTER — Other Ambulatory Visit: Payer: Self-pay

## 2022-03-10 ENCOUNTER — Other Ambulatory Visit: Payer: Self-pay

## 2022-03-24 ENCOUNTER — Other Ambulatory Visit: Payer: Self-pay

## 2022-04-07 ENCOUNTER — Other Ambulatory Visit: Payer: Self-pay

## 2022-04-11 ENCOUNTER — Other Ambulatory Visit: Payer: Self-pay

## 2022-04-14 ENCOUNTER — Other Ambulatory Visit: Payer: Self-pay

## 2022-04-15 ENCOUNTER — Other Ambulatory Visit: Payer: Self-pay

## 2022-04-22 ENCOUNTER — Other Ambulatory Visit: Payer: Self-pay

## 2022-05-06 ENCOUNTER — Other Ambulatory Visit: Payer: Self-pay | Admitting: Family Medicine

## 2022-05-06 ENCOUNTER — Other Ambulatory Visit: Payer: Self-pay

## 2022-05-06 DIAGNOSIS — R053 Chronic cough: Secondary | ICD-10-CM

## 2022-05-06 MED ORDER — CETIRIZINE HCL 10 MG PO TABS
10.0000 mg | ORAL_TABLET | Freq: Every day | ORAL | 3 refills | Status: DC
  Filled 2022-05-06: qty 30, 30d supply, fill #0
  Filled 2022-06-01: qty 30, 30d supply, fill #1
  Filled 2022-07-06: qty 30, 30d supply, fill #2
  Filled 2022-08-03: qty 30, 30d supply, fill #3

## 2022-05-06 NOTE — Telephone Encounter (Signed)
Requested Prescriptions  Pending Prescriptions Disp Refills   cetirizine (ZYRTEC) 10 MG tablet 30 tablet 3    Sig: Take 1 tablet (10 mg total) by mouth daily.     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

## 2022-05-07 ENCOUNTER — Other Ambulatory Visit: Payer: Self-pay

## 2022-05-14 ENCOUNTER — Other Ambulatory Visit: Payer: Self-pay

## 2022-05-19 ENCOUNTER — Other Ambulatory Visit: Payer: Self-pay

## 2022-06-02 ENCOUNTER — Other Ambulatory Visit: Payer: Self-pay

## 2022-06-09 ENCOUNTER — Other Ambulatory Visit: Payer: Self-pay

## 2022-06-10 ENCOUNTER — Other Ambulatory Visit: Payer: Self-pay

## 2022-06-11 ENCOUNTER — Other Ambulatory Visit: Payer: Self-pay

## 2022-06-23 ENCOUNTER — Other Ambulatory Visit: Payer: Self-pay | Admitting: Family Medicine

## 2022-06-24 ENCOUNTER — Other Ambulatory Visit: Payer: Self-pay

## 2022-06-24 MED ORDER — LEVOTHYROXINE SODIUM 50 MCG PO TABS
50.0000 ug | ORAL_TABLET | Freq: Every day | ORAL | 0 refills | Status: DC
  Filled 2022-06-24: qty 30, 30d supply, fill #0

## 2022-06-24 NOTE — Telephone Encounter (Signed)
Requested Prescriptions  Pending Prescriptions Disp Refills   levothyroxine (SYNTHROID) 50 MCG tablet 30 tablet 0    Sig: Take 1 tablet (50 mcg total) by mouth daily before breakfast.     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

## 2022-07-07 ENCOUNTER — Other Ambulatory Visit: Payer: Self-pay

## 2022-07-08 ENCOUNTER — Telehealth: Payer: Self-pay

## 2022-07-08 ENCOUNTER — Encounter: Payer: Self-pay | Admitting: Family Medicine

## 2022-07-08 ENCOUNTER — Ambulatory Visit: Payer: Medicaid Other | Attending: Family Medicine | Admitting: Family Medicine

## 2022-07-08 ENCOUNTER — Other Ambulatory Visit: Payer: Self-pay

## 2022-07-08 VITALS — BP 113/67 | HR 46 | Temp 98.0°F | Ht 72.0 in | Wt 184.2 lb

## 2022-07-08 DIAGNOSIS — I2510 Atherosclerotic heart disease of native coronary artery without angina pectoris: Secondary | ICD-10-CM | POA: Insufficient documentation

## 2022-07-08 DIAGNOSIS — R7303 Prediabetes: Secondary | ICD-10-CM | POA: Insufficient documentation

## 2022-07-08 DIAGNOSIS — Z7984 Long term (current) use of oral hypoglycemic drugs: Secondary | ICD-10-CM | POA: Insufficient documentation

## 2022-07-08 DIAGNOSIS — Z951 Presence of aortocoronary bypass graft: Secondary | ICD-10-CM | POA: Diagnosis not present

## 2022-07-08 DIAGNOSIS — E039 Hypothyroidism, unspecified: Secondary | ICD-10-CM | POA: Diagnosis not present

## 2022-07-08 DIAGNOSIS — Z79899 Other long term (current) drug therapy: Secondary | ICD-10-CM | POA: Diagnosis not present

## 2022-07-08 DIAGNOSIS — Z7952 Long term (current) use of systemic steroids: Secondary | ICD-10-CM | POA: Insufficient documentation

## 2022-07-08 DIAGNOSIS — Z7982 Long term (current) use of aspirin: Secondary | ICD-10-CM | POA: Insufficient documentation

## 2022-07-08 DIAGNOSIS — I5022 Chronic systolic (congestive) heart failure: Secondary | ICD-10-CM

## 2022-07-08 DIAGNOSIS — J849 Interstitial pulmonary disease, unspecified: Secondary | ICD-10-CM | POA: Insufficient documentation

## 2022-07-08 DIAGNOSIS — R053 Chronic cough: Secondary | ICD-10-CM | POA: Insufficient documentation

## 2022-07-08 LAB — POCT GLYCOSYLATED HEMOGLOBIN (HGB A1C): HbA1c, POC (controlled diabetic range): 6 % (ref 0.0–7.0)

## 2022-07-08 NOTE — Telephone Encounter (Signed)
Appointment obtained for the patient with Cardiology and Pulmonary. Appointment  also obtained for pulmonary function test . Patient informed that he could not smoke, drink alcohol, have any caffeine , do any strenuous activity and he's to wear loose clothes for pulmonary function test . Reinforced with patient the importance of keep all the appointments that have been obtained. Patient also informed that pulmonary will not see him until he completes the pulmonary  function test. Patient voiced understanding of all discussed .

## 2022-07-08 NOTE — Progress Notes (Signed)
Subjective:  Patient ID: Jonathan King, male    DOB: Jun 06, 1965  Age: 57 y.o. MRN: [id number]  CC: Prediabetes   HPI Jonathan King is a 57 y.o. year old male with a history of three-vessel CAD status post CABG, CHF EF 50 to 55%, grade 1 DD from echo of 09/2021, ILD.  Interval History:  Seen by pulmonary in 01/2022 for chronic cough and underwent workup for interstitial lung disease with recommendation for repeat chest high-resolution CT in 6 months and follow-up PFT.  He does have residual cough which he describes as mild. CHF clinic visit was in 12/2021 with recommendation to continue current management.  He has no follow-up with the cardiology clinic. I have reviewed his medication list and it appears he is not taking Wilder Glade, Entresto, Levothyroxine, atorvastatin. He states he cannot afford Entresto.  He has a list of what he is taking written down on a sheet of paper and has been taking his spironolactone, aspirin, mexiletine, carvedilol, Zyrtec, potassium. He has no additional concerns today. 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    Past Surgical History:  Procedure Laterality Date   CARDIAC SURGERY     CHOLECYSTECTOMY  01/31/2012   Procedure: LAPAROSCOPIC CHOLECYSTECTOMY;  Surgeon: Gayland Curry, MD,FACS;  Location: [place];  Service: General;  Laterality: N/A;   CORONARY ARTERY BYPASS GRAFT N/A 12/21/2018   Procedure: CORONARY ARTERY BYPASS GRAFTING (CABG) x 3, USING LEFT INTERNAL MAMMARY ARTERY (LIMA) AND RIGHT GREATER SAPHENOUS VEIN (SVG) HARVESTED ENDOSCOPICALLY;    LIMA-LAD, SVG-RAMUS, SVG-DIAG;  Surgeon: Ivin Poot, MD;  Location: [place];  Service: Open Heart Surgery;  Laterality: N/A;   RIGHT/LEFT HEART CATH AND CORONARY ANGIOGRAPHY N/A 12/16/2018   Procedure: RIGHT/LEFT HEART CATH AND CORONARY ANGIOGRAPHY;  Surgeon: Jolaine Artist, MD;  Location: [place] [facility];  Service:  Cardiovascular;  Laterality: N/A;   TEE WITHOUT CARDIOVERSION N/A 12/21/2018   Procedure: TRANSESOPHAGEAL ECHOCARDIOGRAM (TEE);  Surgeon: Prescott Gum, Collier Salina, MD;  Location: [facility] [place];  Service: Open Heart Surgery;  Laterality: N/A;    Family History  Problem Relation Age of Onset   Parkinson's disease Mother     Social History   Socioeconomic History   Marital status: Divorced    Spouse name: Not on file   Number of children: Not on file   Years of education: Not on file   Highest education level: Not on file  Occupational History   Not on file  Tobacco Use   Smoking status: Never   Smokeless tobacco: Never  Vaping Use   Vaping Use: Never used  Substance and Sexual Activity   Alcohol use: Yes    Comment: occ   Drug use: No   Sexual activity: Not on file  Other Topics Concern   Not on file  Social History Narrative   Not on file   Social Determinants of Health   Financial Resource Strain: Not on file  Food Insecurity: Not on file  Transportation Needs: Not on file  Physical Activity: Not on file  Stress: Not on file  Social Connections: Not on file    No Known Allergies  Outpatient Medications Prior to Visit  Medication Sig Dispense Refill   aspirin EC 81 MG tablet Take 1 tablet (81 mg total) by mouth daily. 30 tablet 11   carvedilol (COREG) 3.125 MG tablet Take 1 tablet (3.125  mg total) by mouth 2 (two) times daily. 60 tablet 11   cetirizine (ZYRTEC) 10 MG tablet Take 1 tablet (10 mg total) by mouth daily. 30 tablet 3   mexiletine (MEXITIL) 200 MG capsule Take 1 capsule (200 mg total) by mouth 2 (two) times daily. 180 capsule 3   potassium chloride (KLOR-CON) 10 MEQ tablet Take 1 tablet (10 mEq total) by mouth daily. Needs appt for further refills 90 tablet 3   spironolactone (ALDACTONE) 25 MG tablet Take 1 tablet (25 mg total) by mouth daily. 90 tablet 3   atorvastatin (LIPITOR) 80 MG tablet Take 1 tablet (80 mg total) by mouth daily. (Patient not taking: Reported on  07/08/2022) 30 tablet 11   dapagliflozin propanediol (FARXIGA) 5 MG TABS tablet Take 1 tablet (5 mg total) by mouth daily before breakfast. (Patient not taking: Reported on 01/07/2022) 30 tablet 11   levothyroxine (SYNTHROID) 50 MCG tablet Take 1 tablet (50 mcg total) by mouth daily before breakfast. (Patient not taking: Reported on 07/08/2022) 30 tablet 0   sacubitril-valsartan (ENTRESTO) 24-26 MG Take 1 tablet by mouth 2 (two) times daily. (Patient not taking: Reported on 07/08/2022) 180 tablet 3   predniSONE (DELTASONE) 20 MG tablet Take 1 tablet (20 mg total) by mouth daily with breakfast. (Patient not taking: Reported on 02/07/2022) 5 tablet 0   No facility-administered medications prior to visit.     ROS Review of Systems  Constitutional:  Negative for activity change and appetite change.  HENT:  Negative for sinus pressure and sore throat.   Respiratory:  Negative for chest tightness, shortness of breath and wheezing.   Cardiovascular:  Negative for chest pain and palpitations.  Gastrointestinal:  Negative for abdominal distention, abdominal pain and constipation.  Genitourinary: Negative.   Musculoskeletal: Negative.   Psychiatric/Behavioral:  Negative for behavioral problems and dysphoric mood.     Objective:  BP 113/67   Pulse (!) 46   Temp 98 F (36.7 C) (Oral)   Ht 6' (1.829 m)   Wt 184 lb 3.2 oz (83.6 kg)   SpO2 96%   BMI 24.98 kg/m      07/08/2022    1:44 PM 07/08/2022    1:19 PM 02/07/2022    2:19 PM  BP/Weight  Systolic BP 123456 [id number]  Diastolic BP 67 83 70  Wt. (Lbs) 184.2  182.4  BMI 24.98 kg/m2  24.74 kg/m2      Physical Exam Constitutional:      Appearance: He is well-developed.  Cardiovascular:     Rate and Rhythm: Normal rate.     Heart sounds: Normal heart sounds. No murmur heard. Pulmonary:     Effort: Pulmonary effort is normal.     Breath sounds: Normal breath sounds. No wheezing or rales.  Chest:     Chest wall: No tenderness.  Abdominal:      General: Bowel sounds are normal. There is no distension.     Palpations: Abdomen is soft. There is no mass.     Tenderness: There is no abdominal tenderness.  Musculoskeletal:        General: Normal range of motion.     Right lower leg: No edema.     Left lower leg: No edema.  Neurological:     Mental Status: He is alert and oriented to person, place, and time.  Psychiatric:        Mood and Affect: Mood normal.        Latest Ref Rng & Units 01/15/2022  3:32 PM 01/07/2022    2:45 PM 09/13/2021    3:42 PM  CMP  Glucose 70 - 99 mg/dL 121  95  105   BUN 6 - 20 mg/dL '16  18  15   '$ Creatinine 0.61 - 1.24 mg/dL 0.95  0.98  0.81   Sodium 135 - 145 mmol/L 134  138  138   Potassium 3.5 - 5.1 mmol/L 4.2  4.5  4.4   Chloride 98 - 111 mmol/L 104  100  105   CO2 22 - 32 mmol/L '24  25  24   '$ Calcium 8.9 - 10.3 mg/dL 8.9  9.0  9.4   Total Protein 6.5 - 8.1 g/dL 7.9   7.6   Total Bilirubin 0.3 - 1.2 mg/dL 0.6   0.7   Alkaline Phos 38 - 126 U/L 111   109   AST 15 - 41 U/L 25   28   ALT 0 - 44 U/L 29   37     Lipid Panel     Component Value Date/Time   CHOL 77 09/13/2021 1542   TRIG 103 09/13/2021 1542   HDL 28 (L) 09/13/2021 1542   CHOLHDL 2.8 09/13/2021 1542   VLDL 21 09/13/2021 1542   LDLCALC 28 09/13/2021 1542    CBC    Component Value Date/Time   WBC 13.6 (H) 01/15/2022 1532   RBC 4.88 01/15/2022 1532   HGB 14.2 01/15/2022 1532   HCT 42.1 01/15/2022 1532   PLT 282 01/15/2022 1532   MCV 86.3 01/15/2022 1532   MCH 29.1 01/15/2022 1532   MCHC 33.7 01/15/2022 1532   RDW 14.5 01/15/2022 1532   LYMPHSABS 2.3 12/19/2018 0555   MONOABS 0.8 12/19/2018 0555   EOSABS 0.3 12/19/2018 0555   BASOSABS 0.1 12/19/2018 0555    Lab Results  Component Value Date   HGBA1C 6.0 07/08/2022    Assessment & Plan:  1. Pre-diabetes Labs reveal prediabetes with an A1c of 6.0.  Working on a low carbohydrate diet, exercise, weight loss is recommended in order to prevent progression to  type 2 diabetes mellitus.  - POCT glycosylated hemoglobin (Hb A1C)  2. Chronic HFrEF (heart failure with reduced ejection fraction) (HCC) 50 to 55% from echo of 09/2021 He has been without his Wilder Glade and Delene Loll It appears he has been unable to afford this and had been obtaining this through the heart failure fund I will send a message to his cardiologist Dr. Haroldine Laws so he can receive this through the heart failure fund again We have obtained with the CHF clinic for 2 weeks - CMP14+EGFR  3. Acquired hypothyroidism Previously on amiodarone which has been discontinued Will check thyroid labs - T4, free - TSH - T3  4. S/P CABG x 3 Asymptomatic  5.  Chronic cough Improved Undergoing workup for ILD Keep upcoming appointment for high-resolution chest CT Will obtain an appointment with his pulmonologist   No orders of the defined types were placed in this encounter.   Follow-up: Return in about 6 months (around 01/08/2023) for Chronic medical conditions.       Charlott Rakes, MD, FAAFP. [facility] [place] [place], [place]   07/08/2022, 2:38 PM

## 2022-07-08 NOTE — Progress Notes (Signed)
Patient seen by Jonathan King, PharmD Candidate on 07/07/2022 while they were picking up prescriptions at [place] at [facility].   Blood pressure today was : 132/83, HR 54   Patient does not have an automated home blood pressure machine.   Medication review was performed. They are not taking medications as prescribed. Differences from their prescribed list include: Patient reports being unable to afford Entresto at this time.   The following barriers to adherence were noted:  - They do have cost concerns.  - They do not have transportation concerns.  - They do not need assistance obtaining refills.  - They do not occasionally forget to take some of their prescribed medications.  - They do not feel like one/some of their medications make them feel poorly.  - They do not have questions or concerns about their medications.  - They do have follow up scheduled with their primary care provider/cardiologist.   The following interventions were completed:  - Medications were reviewed  - Patient was educated on goal blood pressures and long term health implications of elevated blood pressure  - Patient was educated on how to access home blood pressure machine   The patient has follow up scheduled:  PCP: 07/08/2022 with Dr. Quintin Alto  PharmD Candidate   Jonathan King, Pharm.D. PGY-2 Ambulatory Care Pharmacy Resident

## 2022-07-09 ENCOUNTER — Telehealth (HOSPITAL_COMMUNITY): Payer: Self-pay | Admitting: Licensed Clinical Social Worker

## 2022-07-09 ENCOUNTER — Other Ambulatory Visit (HOSPITAL_COMMUNITY): Payer: Self-pay

## 2022-07-09 ENCOUNTER — Other Ambulatory Visit: Payer: Self-pay | Admitting: Family Medicine

## 2022-07-09 ENCOUNTER — Telehealth (HOSPITAL_COMMUNITY): Payer: Self-pay | Admitting: Pharmacy Technician

## 2022-07-09 DIAGNOSIS — E039 Hypothyroidism, unspecified: Secondary | ICD-10-CM

## 2022-07-09 LAB — CMP14+EGFR
ALT: 33 IU/L (ref 0–44)
AST: 27 IU/L (ref 0–40)
Albumin/Globulin Ratio: 1.3 (ref 1.2–2.2)
Albumin: 4.4 g/dL (ref 3.8–4.9)
Alkaline Phosphatase: 124 IU/L — ABNORMAL HIGH (ref 44–121)
BUN/Creatinine Ratio: 14 (ref 9–20)
BUN: 15 mg/dL (ref 6–24)
Bilirubin Total: 0.7 mg/dL (ref 0.0–1.2)
CO2: 23 mmol/L (ref 20–29)
Calcium: 9.7 mg/dL (ref 8.7–10.2)
Chloride: 99 mmol/L (ref 96–106)
Creatinine, Ser: 1.04 mg/dL (ref 0.76–1.27)
Globulin, Total: 3.4 g/dL (ref 1.5–4.5)
Glucose: 91 mg/dL (ref 70–99)
Potassium: 4.4 mmol/L (ref 3.5–5.2)
Sodium: 138 mmol/L (ref 134–144)
Total Protein: 7.8 g/dL (ref 6.0–8.5)
eGFR: 84 mL/min/{1.73_m2} (ref >59)

## 2022-07-09 LAB — TSH: TSH: 7.86 u[IU]/mL — ABNORMAL HIGH (ref 0.450–4.500)

## 2022-07-09 LAB — T4, FREE: Free T4: 0.86 ng/dL (ref 0.82–1.77)

## 2022-07-09 LAB — T3: T3, Total: 138 ng/dL (ref 71–180)

## 2022-07-09 MED ORDER — LEVOTHYROXINE SODIUM 50 MCG PO TABS
50.0000 ug | ORAL_TABLET | Freq: Every day | ORAL | 2 refills | Status: DC
  Filled 2022-07-09 – 2022-07-25 (×2): qty 30, 30d supply, fill #0
  Filled 2022-08-25: qty 30, 30d supply, fill #1
  Filled 2022-09-28: qty 30, 30d supply, fill #2

## 2022-07-09 NOTE — Telephone Encounter (Signed)
CSW received referral to assist patient with medication costs. CSW attempted to contact patient and message left for return call. Raquel Sarna, [place], [place]

## 2022-07-09 NOTE — Telephone Encounter (Signed)
Patient called to share that he can afford the $4 co pays for his medications but was unable to afford the [place] due to high costs. Patient shared he is unsure about the Lipitor as he states he is not currently taking this medication. CSW will reach out to Pharmacy to clarify the Lipitor. Patient verbalizes understanding and states he can afford the $4 co pay for each medication. CSW available as needed. Raquel Sarna, [place], [place]

## 2022-07-09 NOTE — Telephone Encounter (Signed)
Patient Advocate Encounter   Received notification from [employer] that prior authorization for Wilder Glade is required.   PA submitted on CoverMyMeds Key [id number] Status is pending   Will continue to follow.

## 2022-07-09 NOTE — Telephone Encounter (Signed)
Patient Advocate Encounter   Received notification from [employer] that prior authorization for Jonathan King is required.   PA submitted on CoverMyMeds Key [id number] Status is pending   Will continue to follow.

## 2022-07-10 ENCOUNTER — Ambulatory Visit (INDEPENDENT_AMBULATORY_CARE_PROVIDER_SITE_OTHER): Payer: Medicaid Other | Admitting: Pulmonary Disease

## 2022-07-10 ENCOUNTER — Other Ambulatory Visit: Payer: Self-pay

## 2022-07-10 DIAGNOSIS — R053 Chronic cough: Secondary | ICD-10-CM

## 2022-07-10 LAB — PULMONARY FUNCTION TEST
DL/VA % pred: 81 %
DL/VA: 3.46 ml/min/mmHg/L
DLCO cor % pred: 78 %
DLCO cor: 23.58 ml/min/mmHg
DLCO unc % pred: 78 %
DLCO unc: 23.58 ml/min/mmHg
FEF 25-75 Post: 4.73 L/sec
FEF 25-75 Pre: 4.25 L/sec
FEF2575-%Change-Post: 11 %
FEF2575-%Pred-Post: 140 %
FEF2575-%Pred-Pre: 126 %
FEV1-%Change-Post: 0 %
FEV1-%Pred-Post: 99 %
FEV1-%Pred-Pre: 98 %
FEV1-Post: 3.99 L
FEV1-Pre: 3.96 L
FEV1FVC-%Change-Post: 3 %
FEV1FVC-%Pred-Pre: 108 %
FEV6-%Change-Post: -2 %
FEV6-%Pred-Post: 92 %
FEV6-%Pred-Pre: 94 %
FEV6-Post: 4.63 L
FEV6-Pre: 4.76 L
FEV6FVC-%Pred-Post: 104 %
FEV6FVC-%Pred-Pre: 104 %
FVC-%Change-Post: -2 %
FVC-%Pred-Post: 88 %
FVC-%Pred-Pre: 90 %
FVC-Post: 4.63 L
FVC-Pre: 4.76 L
Post FEV1/FVC ratio: 86 %
Post FEV6/FVC ratio: 100 %
Pre FEV1/FVC ratio: 83 %
Pre FEV6/FVC Ratio: 100 %
RV % pred: 157 %
RV: 3.57 L
TLC % pred: 115 %
TLC: 8.5 L

## 2022-07-10 NOTE — Patient Instructions (Signed)
Full PFT Performed Today  

## 2022-07-10 NOTE — Progress Notes (Signed)
Full PFT Performed Today  

## 2022-07-10 NOTE — Telephone Encounter (Signed)
Advanced Heart Failure Patient Advocate Encounter  Prior Authorization for Delene Loll has been approved.    Effective dates: 07/09/22 through 07/09/23  Charlann Boxer, CPhT

## 2022-07-10 NOTE — Telephone Encounter (Addendum)
Advanced Heart Failure Patient Advocate Encounter  Prior Authorization for Farxiga 10mg  has been approved.    Effective dates: 07/09/22 through 07/09/23  Charlann Boxer, CPhT

## 2022-07-11 ENCOUNTER — Other Ambulatory Visit: Payer: Self-pay

## 2022-07-16 ENCOUNTER — Encounter: Payer: Self-pay | Admitting: Pulmonary Disease

## 2022-07-16 ENCOUNTER — Ambulatory Visit: Payer: Medicaid Other | Admitting: Pulmonary Disease

## 2022-07-16 VITALS — BP 122/76 | HR 72 | Resp 97 | Ht 72.0 in | Wt 189.0 lb

## 2022-07-16 DIAGNOSIS — R053 Chronic cough: Secondary | ICD-10-CM

## 2022-07-16 NOTE — Progress Notes (Signed)
Jonathan King    [id number]    [birth year]  Primary Care Physician:Newlin, Charlane Ferretti, MD  Referring Physician: Charlott Rakes, MD [name],  [place] [address]  Chief complaint:   History of cough  HPI:  He does have a chronic cough Overall has been well  Has not had any fevers or chills  Has remained active, can walk without limitations  Had a PFT recently showing stable findings No obstruction, no significant bronchodilator response, no restriction, normal diffusing capacity -This is comparable to a PFT 6 months ago -Improved compared to 1 performed 3 years ago  Denies any pain or discomfort Denies any arthritic problems Denies symptoms of reflux Denies nasal stuffiness or congestion No skin rash  He is not limited with activities of daily living States he can walk for a long distance without being bothered by his breathing  Never smoker  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   atorvastatin (LIPITOR) 80 MG tablet Take 1 tablet (80 mg total) by mouth daily.   carvedilol (COREG) 3.125 MG tablet Take 1 tablet (3.125 mg total) by mouth 2 (two) times daily.   cetirizine (ZYRTEC) 10 MG tablet Take 1 tablet (10 mg total) by mouth daily.   dapagliflozin propanediol (FARXIGA) 5 MG TABS tablet Take 1 tablet (5 mg total) by mouth daily before breakfast.   levothyroxine (SYNTHROID) 50 MCG tablet Take 1 tablet (50 mcg total) by mouth daily before breakfast.   mexiletine (MEXITIL) 200 MG capsule Take 1 capsule (200 mg total) by mouth 2 (two) times daily.   potassium chloride (KLOR-CON) 10 MEQ tablet Take 1 tablet (10 mEq total) by mouth daily. Needs appt for further refills   sacubitril-valsartan (ENTRESTO) 24-26 MG Take 1 tablet by mouth 2 (two)  times daily.   spironolactone (ALDACTONE) 25 MG tablet Take 1 tablet (25 mg total) by mouth daily.   No facility-administered encounter medications on file as of 07/16/2022.    Allergies as of 07/16/2022   (No Known Allergies)    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    Past Surgical History:  Procedure Laterality Date   CARDIAC SURGERY     CHOLECYSTECTOMY  01/31/2012   Procedure: LAPAROSCOPIC CHOLECYSTECTOMY;  Surgeon: Gayland Curry, MD,FACS;  Location: [place];  Service: General;  Laterality: N/A;   CORONARY ARTERY BYPASS GRAFT N/A 12/21/2018   Procedure: CORONARY ARTERY BYPASS GRAFTING (CABG) x 3, USING LEFT INTERNAL MAMMARY ARTERY (LIMA) AND RIGHT GREATER SAPHENOUS VEIN (SVG) HARVESTED ENDOSCOPICALLY;    LIMA-LAD, SVG-RAMUS, SVG-DIAG;  Surgeon: Ivin Poot, MD;  Location: [place];  Service: Open Heart Surgery;  Laterality: N/A;   RIGHT/LEFT HEART CATH AND CORONARY ANGIOGRAPHY N/A 12/16/2018   Procedure: RIGHT/LEFT HEART CATH AND CORONARY ANGIOGRAPHY;  Surgeon: Jolaine Artist, MD;  Location: [place] [facility];  Service: Cardiovascular;  Laterality: N/A;   TEE WITHOUT CARDIOVERSION N/A 12/21/2018   Procedure: TRANSESOPHAGEAL ECHOCARDIOGRAM (TEE);  Surgeon: Prescott Gum, Collier Salina, MD;  Location: [place];  Service: Open Heart Surgery;  Laterality: N/A;    Family History  Problem Relation Age of Onset   Parkinson's disease Mother     Social History   Socioeconomic History   Marital status: Divorced    Spouse name: Not on file   Number of children: Not on file   Years of education: Not on file   Highest education level: Not on file  Occupational History   Not on file  Tobacco Use   Smoking status: Never   Smokeless tobacco: Never  Vaping Use   Vaping Use: Never used  Substance and Sexual Activity   Alcohol use: Yes    Comment: occ   Drug use: No   Sexual activity: Not on file  Other Topics  Concern   Not on file  Social History Narrative   Not on file   Social Determinants of Health   Financial Resource Strain: Not on file  Food Insecurity: Not on file  Transportation Needs: Not on file  Physical Activity: Not on file  Stress: Not on file  Social Connections: Not on file  Intimate Partner Violence: Not on file    Review of Systems  Constitutional:  Negative for fatigue.  Respiratory:  Positive for cough.     Vitals:   07/16/22 0835  BP: 122/76  Pulse: 72  Resp: (!) 97     Physical Exam Constitutional:      Appearance: Normal appearance.  HENT:     Head: Normocephalic.     Mouth/Throat:     Mouth: Mucous membranes are moist.  Cardiovascular:     Rate and Rhythm: Normal rate and regular rhythm.     Heart sounds: No murmur heard.    No friction rub.  Pulmonary:     Effort: No respiratory distress.     Breath sounds: No stridor. No wheezing or rhonchi.  Musculoskeletal:     Cervical back: No rigidity or tenderness.  Neurological:     Mental Status: He is alert.  Psychiatric:        Mood and Affect: Mood normal.    Data Reviewed: High-resolution CT scan of the chest was reviewed with the patient and compared with one performed about 3 years ago-this was performed in October-some groundglass changes  Pulmonary function test reviewed showing reduced diffusing capacity  Repeat PFT performed about 07/10/2022-with no obstruction, no restriction, normal diffusing capacity  ANA is positive at 1: 80, rheumatoid factor negative, other markers of inflammation/vasculitis negative  Assessment:  Post inflammation scarring  Pattern of CT not consistent with idiopathic interstitial lung disease  Chronic cough  Denies significant shortness of breath or any other limitation at present  Abnormal CT scan of the chest  ANA positive with ratio 1:80  Plan/Recommendations: Will follow-up with repeat CT scan of the chest  Follow-up on PFT in 6  months  Encouraged to give [place] a call with any significant concerns  Need to monitor closely  Has no symptoms suggesting rheumatologic condition at present  Will follow   Sherrilyn Rist MD [facility] 07/16/2022, 8:48 AM  CC: Charlott Rakes, MD

## 2022-07-16 NOTE — Patient Instructions (Addendum)
I will see you back in about 6 months  We will repeat your CT scan, it is ordered for April  We will get another breathing study in about 6 months Your most recent breathing study was stable compared to 1 performed about 6 months ago  Concern is still the haziness in the lungs that could reflect a scarring process  Your blood work were negative

## 2022-07-18 NOTE — Progress Notes (Signed)
Advanced Heart Failure Clinic Note  PCP: Charlott Rakes, MD CT Surgery: Dr Prescott Gum HF Cardiologist: Dr. Haroldine Laws  HPI: 57 y.o. 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   Admitted with increase shortness of breath. Echo 12/11/18 LVEF 15-20% with moderate RV dysfunction .Chest CT 8/17  with suggestion of severe CAD with totally occluded LAD and high-grade ramus lesion. Also LLL nodule suggestive of lung CA (versus infection). He underwent CABG x3 on 12/21/18. Post operative course was uneventful. He was discharged to home with [facility] on 12/30/2018.   Had follow up with CT surgery. Plan for CT scan of chest to reassess possible mass in a couple of months. Amio was stopped at that visit.   CT chest 11/20 resolution of infiltrate/nodule  Zio 6/23 placed for PVCs 1. Sinus rhythm - avg HR of 75 bpm.  2. 12 runs if nonsustained Ventricular Tachycardia occurred, the run with the fastest interval lasting 4 beats with a max rate of 176 bpm, the longest lasting 5 beats with an avg rate of 132 bpm. 3. Isolated PACs were occasional (1.3%, 8344) 4.  Isolated PVCs were frequent (8.5%, 55250) with two promary morphologies (5.9%, 2,7%) 5. Ventricular Bigeminy and Trigeminy were present. 6. No patient-triggered events were present.   Recently had PFTs for cough FEV1 4.13 L (102%) FVC 5.22  L (99%) DLCO 53%  Follow up 9/23. NYHA II, volume ok. HiRes CT ordered with low DLCO on PFTs, referred to [facility]. Mexiletine started with frequent PVCs.  Follow up with Pulmonary 4/24, pattern of CT not consistent with ILD. PFTs (3/24) FEV1 3.96 L (98%), FVC 4.76 L (90%), DLCO 78%.  Today he returns for HF follow up. Overall feeling fine. He works full time as a Secretary/administrator. He is not SOB with work duties, walking up steps or with ADLs. Denies palpitations, CP, dizziness, edema, or PND/Orthopnea. Appetite ok. No fever or  chills. He does not weigh at home. Taking all medications. He has daytime fatigue but does not think he snores. No ETOH/tobacco/drugs.    Cardiac Studies: - Echo 6/23: EF 50-55% - Echo 1/22: EF 45-50%, RV okay, mild to moderate MR. - Echo 2/21: 30-35%, RV mildly reduced. - Echo 8/20: EF 15%. RV moderately reduced.   - RHC/LHC 12/16/18 Ao = 88/51 (68) LV = 86/17 RA = 3 RV = 46/5 PA = 45/10 (23) PCW = 17 Fick cardiac output/index = 4.2/2.0 PVR = 1.5 WU Ao sat = 98% PA sat = 67%, 68%   Assessment: 1. 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 Well-compensated filling pressures with moderately reduced CO  ROS: All systems negative except as listed in HPI, PMH and Problem List.  SH:  Social History   Socioeconomic History   Marital status: Single    Spouse name: Not on file   Number of children: Not on file   Years of education: Not on file   Highest education level: Not on file  Occupational History   Not on file  Tobacco Use   Smoking status: Never   Smokeless tobacco: Never  Vaping Use   Vaping Use: Never used  Substance and Sexual Activity   Alcohol use: Yes    Comment: occ   Drug use: No   Sexual activity: Not on file  Other Topics Concern   Not on file  Social  History Narrative   Not on file   Social Determinants of Health   Financial Resource Strain: Not on file  Food Insecurity: Not on file  Transportation Needs: Not on file  Physical Activity: Not on file  Stress: Not on file  Social Connections: Not on file  Intimate Partner Violence: Not on file   FH:  Family History  Problem Relation Age of Onset   Parkinson's disease Mother     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   Current Outpatient Medications  Medication Sig Dispense Refill   aspirin EC 81 MG tablet Take 1 tablet (81 mg total) by mouth daily. 30  tablet 11   atorvastatin (LIPITOR) 80 MG tablet Take 1 tablet (80 mg total) by mouth daily. 30 tablet 11   carvedilol (COREG) 3.125 MG tablet Take 1 tablet (3.125 mg total) by mouth 2 (two) times daily. 60 tablet 11   cetirizine (ZYRTEC) 10 MG tablet Take 1 tablet (10 mg total) by mouth daily. 30 tablet 3   dapagliflozin propanediol (FARXIGA) 5 MG TABS tablet Take 1 tablet (5 mg total) by mouth daily before breakfast. 30 tablet 11   levothyroxine (SYNTHROID) 50 MCG tablet Take 1 tablet (50 mcg total) by mouth daily before breakfast. 30 tablet 2   mexiletine (MEXITIL) 200 MG capsule Take 1 capsule (200 mg total) by mouth 2 (two) times daily. 180 capsule 3   potassium chloride (KLOR-CON) 10 MEQ tablet Take 1 tablet (10 mEq total) by mouth daily. Needs appt for further refills 90 tablet 3   sacubitril-valsartan (ENTRESTO) 24-26 MG Take 1 tablet by mouth 2 (two) times daily. 180 tablet 3   spironolactone (ALDACTONE) 25 MG tablet Take 1 tablet (25 mg total) by mouth daily. 90 tablet 3   No current facility-administered medications for this encounter.   BP 118/70   Pulse 80   Wt 84.8 kg (187 lb)   SpO2 96%   BMI 25.36 kg/m   Wt Readings from Last 3 Encounters:  07/23/22 84.8 kg (187 lb)  07/16/22 85.7 kg (189 lb)  07/08/22 83.6 kg (184 lb 3.2 oz)   PHYSICAL EXAM: General:  NAD. No resp difficulty, walked into clinic HEENT: Normal Neck: Supple. No JVD. Carotids 2+ bilat; no bruits. No lymphadenopathy or thryomegaly appreciated. Cor: PMI nondisplaced. Irregular rate & rhythm. No rubs, gallops or murmurs. Lungs: Clear Abdomen: Soft, nontender, nondistended. No hepatosplenomegaly. No bruits or masses. Good bowel sounds. Extremities: No cyanosis, clubbing, rash, edema Neuro: Alert & oriented x 3, cranial nerves grossly intact. Moves all 4 extremities w/o difficulty. Affect pleasant.  ECG: SR with frequent PVCs, rBBB (personally reviewed with Dr. Haroldine Laws)  ASSESSMENT & PLAN: 1. CAD  -  s/p CABG x3 on 12/21/18 - No chest pain. - Continue ASA, statin, & b-blocker  2. Post Operative Atrial Fibrillation:  - In NSR today.  - No recurrence  3. Chronic Systolic CHF  - ICM - Echo 8/20 EF 20-25%  - Echo 11/20 EF 20-25% - Echo 2/21 EF 30-35% - Echo 1/22 EF 45-50%, RV okay, mild to moderate MR - Echo 6/23 EF 50-55% - NYHA I-II. Volume status stable. He does not need lasix.  - Continue Entresto 24/26 mg bid. - Continue carvedilol 3.125 mg bid.  - Continue spiro 25 mg daily. - Continue Farxiga 5 mg daily. - Recent labs reviewed (07/08/22), K 4.4, SCr 1.04  4. High Burden PVCs:  ~ 20% burden initially. Suspect likely contributing to cardiomyopathy.  - suppressed on amio - Amio stopped 01/26/19. PVCs restarted 11/20 - Zio 2/21 no AF, mostly in SR w/ 1st degree HB, some ventricular bigeminy and trigeminy present - Off amio as of 1/23 - Zio 6/23: Isolated PVCs were frequent (8.5%, 55250) with two promary morphologies (5.9%, 2,7%) - Mexiletine 200 mg bid started 9/23 - Frequent PVCs on ECG today. - Increase mexiletine to 300 mg bid. Discussed with Dr. Haroldine Laws - Repeat Zio in 1 month - Obtain cMRI to ensure no infiltrative diseases causing PVCs - Arrange sleep study.  5. Decreased DLCO - HiRes CT (9/23) concerning for ILD - ANA + 1:80, Rheumatoid factor negative, other markers of inflammation/vasculitis negative. - Repeat CT (3/24) not suggestive of ILD - Followed by Pulmonary  6. Hypothyroid - Recent TSH 7.8, Free T3 138, Free T4 0.86 - Back on levothyroxine  Follow up in 4-6 weeks with APP (place Zio).  [place] FNP-BC 9:57 AM

## 2022-07-20 ENCOUNTER — Other Ambulatory Visit (HOSPITAL_COMMUNITY): Payer: Self-pay | Admitting: Internal Medicine

## 2022-07-21 ENCOUNTER — Other Ambulatory Visit: Payer: Self-pay

## 2022-07-21 MED ORDER — CARVEDILOL 3.125 MG PO TABS
3.1250 mg | ORAL_TABLET | Freq: Two times a day (BID) | ORAL | 11 refills | Status: DC
  Filled 2022-07-21: qty 60, 30d supply, fill #0
  Filled 2022-08-20: qty 60, 30d supply, fill #1
  Filled 2022-09-14: qty 60, 30d supply, fill #2
  Filled 2022-10-12: qty 60, 30d supply, fill #3
  Filled 2022-11-09: qty 60, 30d supply, fill #4
  Filled 2022-12-14: qty 60, 30d supply, fill #5
  Filled 2023-01-11: qty 60, 30d supply, fill #6
  Filled 2023-02-15 – 2023-02-16 (×2): qty 60, 30d supply, fill #7
  Filled 2023-03-15: qty 60, 30d supply, fill #8
  Filled 2023-04-12: qty 60, 30d supply, fill #9
  Filled 2023-05-17: qty 60, 30d supply, fill #10
  Filled 2023-06-14: qty 60, 30d supply, fill #11

## 2022-07-21 MED ORDER — SPIRONOLACTONE 25 MG PO TABS
25.0000 mg | ORAL_TABLET | Freq: Every day | ORAL | 3 refills | Status: DC
  Filled 2022-07-21: qty 90, 90d supply, fill #0
  Filled 2022-10-12: qty 90, 90d supply, fill #1
  Filled 2023-01-11: qty 90, 90d supply, fill #2
  Filled 2023-04-12: qty 90, 90d supply, fill #3

## 2022-07-21 MED ORDER — POTASSIUM CHLORIDE ER 10 MEQ PO TBCR
10.0000 meq | EXTENDED_RELEASE_TABLET | Freq: Every day | ORAL | 3 refills | Status: DC
  Filled 2022-07-21: qty 90, 90d supply, fill #0
  Filled 2022-10-12: qty 90, 90d supply, fill #1
  Filled 2023-01-11: qty 90, 90d supply, fill #2
  Filled 2023-04-12: qty 90, 90d supply, fill #3

## 2022-07-23 ENCOUNTER — Telehealth (HOSPITAL_COMMUNITY): Payer: Self-pay | Admitting: Pharmacy Technician

## 2022-07-23 ENCOUNTER — Other Ambulatory Visit: Payer: Self-pay

## 2022-07-23 ENCOUNTER — Encounter (HOSPITAL_COMMUNITY): Payer: Self-pay

## 2022-07-23 ENCOUNTER — Ambulatory Visit (HOSPITAL_COMMUNITY)
Admission: RE | Admit: 2022-07-23 | Discharge: 2022-07-23 | Disposition: A | Discharge status: Home / Self Care | Payer: Medicaid Other | Source: Ambulatory Visit | Attending: Family Medicine | Admitting: Family Medicine

## 2022-07-23 ENCOUNTER — Other Ambulatory Visit (HOSPITAL_COMMUNITY): Payer: Self-pay

## 2022-07-23 VITALS — BP 118/70 | HR 80 | Wt 187.0 lb

## 2022-07-23 DIAGNOSIS — I5021 Acute systolic (congestive) heart failure: Secondary | ICD-10-CM | POA: Diagnosis not present

## 2022-07-23 DIAGNOSIS — Z79899 Other long term (current) drug therapy: Secondary | ICD-10-CM | POA: Diagnosis not present

## 2022-07-23 DIAGNOSIS — I2510 Atherosclerotic heart disease of native coronary artery without angina pectoris: Secondary | ICD-10-CM | POA: Diagnosis not present

## 2022-07-23 DIAGNOSIS — I493 Ventricular premature depolarization: Secondary | ICD-10-CM | POA: Insufficient documentation

## 2022-07-23 DIAGNOSIS — Z951 Presence of aortocoronary bypass graft: Secondary | ICD-10-CM | POA: Diagnosis not present

## 2022-07-23 DIAGNOSIS — R942 Abnormal results of pulmonary function studies: Secondary | ICD-10-CM | POA: Diagnosis not present

## 2022-07-23 DIAGNOSIS — E039 Hypothyroidism, unspecified: Secondary | ICD-10-CM | POA: Diagnosis not present

## 2022-07-23 DIAGNOSIS — I4891 Unspecified atrial fibrillation: Secondary | ICD-10-CM | POA: Insufficient documentation

## 2022-07-23 DIAGNOSIS — Z79890 Hormone replacement therapy (postmenopausal): Secondary | ICD-10-CM | POA: Insufficient documentation

## 2022-07-23 DIAGNOSIS — Z9049 Acquired absence of other specified parts of digestive tract: Secondary | ICD-10-CM | POA: Insufficient documentation

## 2022-07-23 DIAGNOSIS — I5022 Chronic systolic (congestive) heart failure: Secondary | ICD-10-CM | POA: Diagnosis not present

## 2022-07-23 DIAGNOSIS — R7989 Other specified abnormal findings of blood chemistry: Secondary | ICD-10-CM | POA: Diagnosis not present

## 2022-07-23 DIAGNOSIS — Z7982 Long term (current) use of aspirin: Secondary | ICD-10-CM | POA: Diagnosis not present

## 2022-07-23 DIAGNOSIS — R008 Other abnormalities of heart beat: Secondary | ICD-10-CM | POA: Insufficient documentation

## 2022-07-23 DIAGNOSIS — I480 Paroxysmal atrial fibrillation: Secondary | ICD-10-CM

## 2022-07-23 LAB — CBC
HCT: 42.7 % (ref 39.0–52.0)
Hemoglobin: 13.9 g/dL (ref 13.0–17.0)
MCH: 30.1 pg (ref 26.0–34.0)
MCHC: 32.6 g/dL (ref 30.0–36.0)
MCV: 92.4 fL (ref 80.0–100.0)
Platelets: 230 10*3/uL (ref 150–400)
RBC: 4.62 MIL/uL (ref 4.22–5.81)
RDW: 13.7 % (ref 11.5–15.5)
WBC: 7.8 10*3/uL (ref 4.0–10.5)
nRBC: 0 % (ref 0.0–0.2)

## 2022-07-23 MED ORDER — MEXILETINE HCL 150 MG PO CAPS
300.0000 mg | ORAL_CAPSULE | Freq: Two times a day (BID) | ORAL | 8 refills | Status: DC
  Filled 2022-07-23: qty 65, 16d supply, fill #0
  Filled 2022-07-23: qty 55, 14d supply, fill #0
  Filled 2022-08-24: qty 120, 30d supply, fill #1
  Filled 2022-09-21: qty 120, 30d supply, fill #2
  Filled 2022-10-19: qty 120, 30d supply, fill #3
  Filled 2022-11-16: qty 120, 30d supply, fill #4
  Filled 2022-12-14: qty 120, 30d supply, fill #5
  Filled 2023-01-18: qty 120, 30d supply, fill #6
  Filled 2023-02-15: qty 120, 30d supply, fill #7
  Filled 2023-03-15: qty 120, 30d supply, fill #8

## 2022-07-23 NOTE — Telephone Encounter (Signed)
Patient Advocate Encounter   Received notification from [employer] that prior authorization for Farxiga 5mg  is required.   PA submitted on CoverMyMeds Key [id number] Status is pending   Will continue to follow.

## 2022-07-23 NOTE — Patient Instructions (Addendum)
Thank you for coming in today  Labs were done today, if any labs are abnormal the clinic will call you No news is good news  Medications: Increase Mexiletine to 300 mg 2 capsules twice daily    Follow up appointments:  Your physician recommends that you schedule a follow-up appointment in:  4-6 weeks in clinic   You will be called and scheduled for a cardiac MRI      Do the following things EVERYDAY: Weigh yourself in the morning before breakfast. Write it down and keep it in a log. Take your medicines as prescribed Eat low salt foods--Limit salt (sodium) to 2000 mg per day.  Stay as active as you can everyday Limit all fluids for the day to less than 2 liters   At the [facility], you and your health needs are our priority. As part of our continuing mission to provide you with exceptional heart care, we have created designated Provider Care Teams. These Care Teams include your primary Cardiologist (physician) and Advanced Practice Providers (APPs- Physician Assistants and Nurse Practitioners) who all work together to provide you with the care you need, when you need it.   You may see any of the following providers on your designated Care Team at your next follow up: Dr Glori Bickers Dr Loralie Champagne Dr. Roxana Hires, NP Lyda Jester, [place] [facility] [place], [place] Forestine Na, NP Audry Riles, PharmD   Please be sure to bring in all your medications bottles to every appointment.    Thank you for choosing [facility]  If you have any questions or concerns before your next appointment please send [place] a message through [place] or call our office at [phone].    TO LEAVE A MESSAGE FOR THE NURSE SELECT OPTION 2, PLEASE LEAVE A MESSAGE INCLUDING: YOUR NAME DATE OF BIRTH CALL BACK NUMBER REASON FOR CALL**this is important as we prioritize the call backs  YOU WILL RECEIVE A CALL  BACK THE SAME DAY AS LONG AS YOU CALL BEFORE 4:00 PM

## 2022-07-23 NOTE — Telephone Encounter (Signed)
Advanced Heart Failure Patient Advocate Encounter  Prior Authorization for [place] 5mg  has been approved.    Effective dates: 07/23/22 through 07/23/23  Charlann Boxer, CPhT

## 2022-07-25 ENCOUNTER — Other Ambulatory Visit: Payer: Self-pay

## 2022-07-28 ENCOUNTER — Other Ambulatory Visit: Payer: Self-pay

## 2022-07-31 ENCOUNTER — Encounter: Payer: Self-pay | Admitting: Pulmonary Disease

## 2022-07-31 ENCOUNTER — Telehealth: Payer: Self-pay

## 2022-07-31 NOTE — Telephone Encounter (Signed)
Pt's insurance will not authorize the CT ordered by Dr. Ander Slade without a current CXR.  Can we please order the CXR so we can get the chest CT completed as scheduled on 4/8.  I believe Dr. Ander Slade is out of the office until 4/9.

## 2022-07-31 NOTE — Telephone Encounter (Signed)
Sir,  Are you ok with a new CXR order for a CT order  Thank you

## 2022-08-01 ENCOUNTER — Other Ambulatory Visit: Payer: Self-pay | Admitting: *Deleted

## 2022-08-01 ENCOUNTER — Ambulatory Visit (INDEPENDENT_AMBULATORY_CARE_PROVIDER_SITE_OTHER): Payer: Medicaid Other

## 2022-08-01 DIAGNOSIS — R053 Chronic cough: Secondary | ICD-10-CM

## 2022-08-01 NOTE — Telephone Encounter (Signed)
Pt called the office. I let him know that he will need to have a cxr performed today 4/5 in order for the CT to still be able to take place 4/8 which is required for insurance purposes.  Pt verbalized understanding and said he would head to the office for the cxr. Order placed.  Routing to [place] as an FYI to be on the look out for the xray being completed.

## 2022-08-01 NOTE — Telephone Encounter (Signed)
Called patient but he did not answer. Left message for patient to call back.  °

## 2022-08-01 NOTE — Telephone Encounter (Addendum)
This will need to be ordered & completed today for the CT scheduled on Monday.  Or the CT will need to be rescheduled.

## 2022-08-04 ENCOUNTER — Ambulatory Visit (HOSPITAL_COMMUNITY): Payer: Medicaid Other

## 2022-08-04 ENCOUNTER — Encounter (HOSPITAL_COMMUNITY): Payer: Self-pay

## 2022-08-04 NOTE — Telephone Encounter (Signed)
CXR has been sent to [employer] for authorization. Pt's CT was rescheduled to 08/21/22.

## 2022-08-19 ENCOUNTER — Encounter (HOSPITAL_COMMUNITY): Payer: Self-pay

## 2022-08-19 ENCOUNTER — Other Ambulatory Visit (HOSPITAL_COMMUNITY): Payer: Self-pay

## 2022-08-19 ENCOUNTER — Ambulatory Visit (HOSPITAL_COMMUNITY)
Admission: RE | Admit: 2022-08-19 | Discharge: 2022-08-19 | Disposition: A | Discharge status: Home / Self Care | Payer: Medicaid Other | Source: Ambulatory Visit | Attending: Family Medicine | Admitting: Family Medicine

## 2022-08-19 ENCOUNTER — Other Ambulatory Visit (HOSPITAL_COMMUNITY): Payer: Self-pay | Admitting: Internal Medicine

## 2022-08-19 ENCOUNTER — Other Ambulatory Visit: Payer: Self-pay

## 2022-08-19 ENCOUNTER — Inpatient Hospital Stay (HOSPITAL_COMMUNITY)
Admission: RE | Admit: 2022-08-19 | Discharge: 2022-08-19 | Disposition: A | Discharge status: Home / Self Care | Payer: Medicaid Other | Source: Ambulatory Visit | Attending: Internal Medicine | Admitting: Internal Medicine

## 2022-08-19 VITALS — BP 118/68 | HR 57 | Wt 184.4 lb

## 2022-08-19 DIAGNOSIS — I493 Ventricular premature depolarization: Secondary | ICD-10-CM | POA: Diagnosis not present

## 2022-08-19 DIAGNOSIS — I255 Ischemic cardiomyopathy: Secondary | ICD-10-CM | POA: Diagnosis not present

## 2022-08-19 DIAGNOSIS — I2510 Atherosclerotic heart disease of native coronary artery without angina pectoris: Secondary | ICD-10-CM | POA: Diagnosis not present

## 2022-08-19 DIAGNOSIS — R7303 Prediabetes: Secondary | ICD-10-CM | POA: Diagnosis not present

## 2022-08-19 DIAGNOSIS — I4891 Unspecified atrial fibrillation: Secondary | ICD-10-CM | POA: Diagnosis not present

## 2022-08-19 DIAGNOSIS — R911 Solitary pulmonary nodule: Secondary | ICD-10-CM | POA: Diagnosis not present

## 2022-08-19 DIAGNOSIS — I480 Paroxysmal atrial fibrillation: Secondary | ICD-10-CM

## 2022-08-19 DIAGNOSIS — I5022 Chronic systolic (congestive) heart failure: Secondary | ICD-10-CM | POA: Insufficient documentation

## 2022-08-19 DIAGNOSIS — Z7982 Long term (current) use of aspirin: Secondary | ICD-10-CM | POA: Insufficient documentation

## 2022-08-19 DIAGNOSIS — E039 Hypothyroidism, unspecified: Secondary | ICD-10-CM | POA: Insufficient documentation

## 2022-08-19 DIAGNOSIS — Z79890 Hormone replacement therapy (postmenopausal): Secondary | ICD-10-CM | POA: Diagnosis not present

## 2022-08-19 DIAGNOSIS — Z951 Presence of aortocoronary bypass graft: Secondary | ICD-10-CM | POA: Diagnosis not present

## 2022-08-19 DIAGNOSIS — R942 Abnormal results of pulmonary function studies: Secondary | ICD-10-CM | POA: Diagnosis not present

## 2022-08-19 DIAGNOSIS — Z79899 Other long term (current) drug therapy: Secondary | ICD-10-CM | POA: Insufficient documentation

## 2022-08-19 DIAGNOSIS — I4510 Unspecified right bundle-branch block: Secondary | ICD-10-CM | POA: Diagnosis not present

## 2022-08-19 MED ORDER — DAPAGLIFLOZIN PROPANEDIOL 5 MG PO TABS
5.0000 mg | ORAL_TABLET | Freq: Every day | ORAL | 3 refills | Status: DC
Start: 2022-08-19 — End: 2023-08-23
  Filled 2022-08-19: qty 90, 90d supply, fill #0
  Filled 2022-11-16: qty 90, 90d supply, fill #1
  Filled 2023-02-22: qty 90, 90d supply, fill #2
  Filled 2023-05-23: qty 90, 90d supply, fill #3

## 2022-08-19 MED ORDER — ENTRESTO 24-26 MG PO TABS
1.0000 | ORAL_TABLET | Freq: Two times a day (BID) | ORAL | 3 refills | Status: DC
  Filled 2022-08-19: qty 180, 90d supply, fill #0
  Filled 2022-11-16 – 2022-11-17 (×2): qty 180, 90d supply, fill #1
  Filled 2023-02-22: qty 180, 90d supply, fill #2
  Filled 2023-05-17: qty 180, 90d supply, fill #3

## 2022-08-19 NOTE — Patient Instructions (Signed)
EKG done today.  No Labs done today.   RESTART Entresto 24-26mg  (1 tablet) by mouth 2 times daily.   RESTART Marcelline Deist  (1 tablet) by mouth daily.   No other medication changes were made. Please continue all current medications as prescribed.  Your provider has recommended that  you wear a Zio Patch for 14 days.  This monitor will record your heart rhythm for our review.  IF you have any symptoms while wearing the monitor please press the button.  If you have any issues with the patch or you notice a red or orange light on it please call the company at [phone].  Once you remove the patch please mail it back to the company as soon as possible so we can get the results.  Your physician recommends that you schedule a follow-up appointment in: 2 weeks for a lab only appointment and 4 months with Dr. Gala Romney. Please contact our office in July to schedule a August appointment.   If you have any questions or concerns before your next appointment please send [place] a message through [place] or call our office at [phone].    TO LEAVE A MESSAGE FOR THE NURSE SELECT OPTION 2, PLEASE LEAVE A MESSAGE INCLUDING: YOUR NAME DATE OF BIRTH CALL BACK NUMBER REASON FOR CALL**this is important as we prioritize the call backs  YOU WILL RECEIVE A CALL BACK THE SAME DAY AS LONG AS YOU CALL BEFORE 4:00 PM   Do the following things EVERYDAY: Weigh yourself in the morning before breakfast. Write it down and keep it in a log. Take your medicines as prescribed Eat low salt foods--Limit salt (sodium) to 2000 mg per day.  Stay as active as you can everyday Limit all fluids for the day to less than 2 liters   At the Advanced Heart Failure Clinic, you and your health needs are our priority. As part of our continuing mission to provide you with exceptional heart care, we have created designated Provider Care Teams. These Care Teams include your primary Cardiologist (physician) and Advanced Practice  Providers (APPs- Physician Assistants and Nurse Practitioners) who all work together to provide you with the care you need, when you need it.   You may see any of the following providers on your designated Care Team at your next follow up: Dr Arvilla Meres Dr Marca Ancona Dr. Marcos Eke, NP Robbie Lis, [place] [facility] Guadalupe Guerra, [place] Brynda Peon, NP Karle Plumber, PharmD   Please be sure to bring in all your medications bottles to every appointment.    Thank you for choosing [facility]

## 2022-08-19 NOTE — Progress Notes (Signed)
Advanced Heart Failure Clinic Note  PCP: Hoy Register, MD CT Surgery: Dr Donata Clay HF Cardiologist: Dr. Gala Romney  HPI: 57 y.o. 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   Admitted with increase shortness of breath. Echo 12/11/18 LVEF 15-20% with moderate RV dysfunction .Chest CT 8/17  with suggestion of severe CAD with totally occluded LAD and high-grade ramus lesion. Also LLL nodule suggestive of lung CA (versus infection). He underwent CABG x3 on 12/21/18. Post operative course was uneventful. He was discharged to home with [facility] on 12/30/2018.   Had follow up with CT surgery. Plan for CT scan of chest to reassess possible mass in a couple of months. Amio was stopped at that visit.   CT chest 11/20 resolution of infiltrate/nodule  Zio 6/23 placed for PVCs 1. Sinus rhythm - avg HR of 75 bpm.  2. 12 runs if nonsustained Ventricular Tachycardia occurred, the run with the fastest interval lasting 4 beats with a max rate of 176 bpm, the longest lasting 5 beats with an avg rate of 132 bpm. 3. Isolated PACs were occasional (1.3%, 8344) 4. Isolated PVCs were frequent (8.5%, 55250) with two promary morphologies (5.9%, 2,7%) 5. Ventricular Bigeminy and Trigeminy were present. 6. No patient-triggered events were present.   Recently had PFTs for cough FEV1 4.13 L (102%) FVC 5.22  L (99%) DLCO 53%  Follow up 9/23. NYHA II, volume ok. HiRes CT ordered with low DLCO on PFTs, referred to [facility]. Mexiletine started with frequent PVCs.  Follow up with Pulmonary 4/24, pattern of CT not consistent with ILD. PFTs (3/24) FEV1 3.96 L (98%), FVC 4.76 L (90%), DLCO 78%.  Follow up 3/24, frequent PVCs on ECG and mexiletine increased to 300 bid. Arranged for cMRI to ensure no infiltrative disease causing PVCs.  Today he returns for HF follow up. Overall feeling fine. 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 He has  no dyspnea with walking up steps or carrying items. Denies palpitations, CP, dizziness, edema, or PND/Orthopnea. Appetite ok. No fever or chills. He does not weigh at home. Off Entresto and [place] due to cost. No ETOH/tobacco/drugs.  Cardiac Studies: - Echo 6/23: EF 50-55% - Echo 1/22: EF 45-50%, RV okay, mild to moderate MR. - Echo 2/21: 30-35%, RV mildly reduced. - Echo 8/20: EF 15%. RV moderately reduced.   - RHC/LHC 12/16/18 Ao = 88/51 (68) LV = 86/17 RA = 3 RV = 46/5 PA = 45/10 (23) PCW = 17 Fick cardiac output/index = 4.2/2.0 PVR = 1.5 WU Ao sat = 98% PA sat = 67%, 68%   Assessment: 1. 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 Well-compensated filling pressures with moderately reduced CO  ROS: All systems negative except as listed in HPI, PMH and Problem List.  SH:  Social History   Socioeconomic History   Marital status: Single    Spouse name: Not on file   Number of children: Not on file   Years of education: Not on file   Highest education level: Not on file  Occupational History   Not on file  Tobacco Use   Smoking status: Never   Smokeless tobacco: Never  Vaping Use   Vaping Use: Never used  Substance and Sexual Activity   Alcohol use: Yes    Comment: occ   Drug use: No   Sexual activity:  Not on file  Other Topics Concern   Not on file  Social History Narrative   Not on file   Social Determinants of Health   Financial Resource Strain: Not on file  Food Insecurity: Not on file  Transportation Needs: Not on file  Physical Activity: Not on file  Stress: Not on file  Social Connections: Not on file  Intimate Partner Violence: Not on file   FH:  Family History  Problem Relation Age of Onset   Parkinson's disease Mother     Past Medical History:  Diagnosis Date   Back pain    CHF (congestive heart failure)    S/P CABG x 3 12/21/2018   Current Outpatient Medications  Medication Sig  Dispense Refill   aspirin EC 81 MG tablet Take 1 tablet (81 mg total) by mouth daily. 30 tablet 11   atorvastatin (LIPITOR) 80 MG tablet Take 1 tablet (80 mg total) by mouth daily. 30 tablet 11   carvedilol (COREG) 3.125 MG tablet Take 1 tablet (3.125 mg total) by mouth 2 (two) times daily. 60 tablet 11   cetirizine (ZYRTEC) 10 MG tablet Take 1 tablet (10 mg total) by mouth daily. 30 tablet 3   levothyroxine (SYNTHROID) 50 MCG tablet Take 1 tablet (50 mcg total) by mouth daily before breakfast. 30 tablet 2   mexiletine (MEXITIL) 150 MG capsule Take 2 capsules (300 mg total) by mouth 2 (two) times daily. 120 capsule 8   potassium chloride (KLOR-CON) 10 MEQ tablet Take 1 tablet (10 mEq total) by mouth daily. Needs appt for further refills 90 tablet 3   spironolactone (ALDACTONE) 25 MG tablet Take 1 tablet (25 mg total) by mouth daily. 90 tablet 3   No current facility-administered medications for this encounter.   BP 118/68   Pulse (!) 57   Wt 83.6 kg (184 lb 6.4 oz)   SpO2 97%   BMI 25.01 kg/m   Wt Readings from Last 3 Encounters:  08/19/22 83.6 kg (184 lb 6.4 oz)  07/23/22 84.8 kg (187 lb)  07/16/22 85.7 kg (189 lb)   PHYSICAL EXAM: General:  NAD. No resp difficulty HEENT: Normal Neck: Supple. No JVD. Carotids 2+ bilat; no bruits. No lymphadenopathy or thryomegaly appreciated. Cor: PMI nondisplaced. Regular rate & rhythm. No rubs, gallops or murmurs. Lungs: Clear Abdomen: Soft, nontender, nondistended. No hepatosplenomegaly. No bruits or masses. Good bowel sounds. Extremities: No cyanosis, clubbing, rash, edema Neuro: Alert & oriented x 3, cranial nerves grossly intact. Moves all 4 extremities w/o difficulty. Affect pleasant, + stutter  ECG: SR with PACs and 1 PVC, rBBB (personally reviewed)  ASSESSMENT & PLAN: 1. CAD  - s/p CABG x3 on 12/21/18 - No chest pain. - Continue ASA, statin, & b-blocker  2. Chronic Systolic CHF  - ICM - Echo 8/20 EF 20-25%  - Echo 11/20 EF  20-25% - Echo 2/21 EF 30-35% - Echo 1/22 EF 45-50%, RV okay, mild to moderate MR - Echo 6/23 EF 50-55% - NYHA I-II. Volume status stable. He does not need lasix.  - Restart Entresto 24/26 mg bid $4/90-day supply - Restart Farxiga 5 mg daily $4/90-day supply - Continue carvedilol 3.125 mg bid.  - Continue spiro 25 mg daily. - Recent labs reviewed (07/08/22), K 4.4, SCr 1.04; repeat BMET in 2 weeks.  3. High Burden PVCs:  ~ 20% burden initially. Suspect likely contributing to cardiomyopathy.  - suppressed on amio - Amio stopped 01/26/19. PVCs restarted 11/20 - Zio 2/21 no AF, mostly  in SR w/ 1st degree HB, some ventricular bigeminy and trigeminy present - Off amio as of 1/23 - Zio 6/23: Isolated PVCs were frequent (8.5%, 55250) with two promary morphologies (5.9%, 2,7%) - Mexiletine 200 mg bid started 9/23, increased to 300 bid (3/24) due to increased PVC on ECG - Continue mexiletine 300 mg bid. - Repeat Zio 2 week to ensure PVCs adequately suppressed - cMRI (scheduled 10/10/22) to ensure no infiltrative diseases causing PVCs. - Sleep study arranged.  4. Atrial Fibrillation, post op:  - In NSR today.  - No recurrence  5. Decreased DLCO - HiRes CT (9/23) concerning for ILD - ANA + 1:80, Rheumatoid factor negative, other markers of inflammation/vasculitis negative. - Repeat CT (3/24) not suggestive of ILD - Followed by Pulmonary  6. Hypothyroid - Recent TSH 7.8, Free T3 138, Free T4 0.86 - Back on levothyroxine  Follow up in 4 months with Dr. Gala Romney.  [place] Boyd Litaker FNP-BC 11:46 AM

## 2022-08-19 NOTE — Progress Notes (Signed)
Zio patch placed onto patient.  All instructions and information reviewed with patient, they verbalize understanding with no questions. 

## 2022-08-21 ENCOUNTER — Ambulatory Visit (HOSPITAL_COMMUNITY)
Admission: RE | Admit: 2022-08-21 | Discharge: 2022-08-21 | Disposition: A | Discharge status: Home / Self Care | Payer: Medicaid Other | Source: Ambulatory Visit | Attending: Pulmonary Disease | Admitting: Pulmonary Disease

## 2022-08-21 DIAGNOSIS — R053 Chronic cough: Secondary | ICD-10-CM | POA: Diagnosis not present

## 2022-08-21 DIAGNOSIS — J8410 Pulmonary fibrosis, unspecified: Secondary | ICD-10-CM | POA: Diagnosis not present

## 2022-08-22 ENCOUNTER — Other Ambulatory Visit (HOSPITAL_COMMUNITY): Payer: Self-pay | Admitting: Internal Medicine

## 2022-08-22 ENCOUNTER — Other Ambulatory Visit: Payer: Self-pay

## 2022-08-22 MED ORDER — ATORVASTATIN CALCIUM 80 MG PO TABS
80.0000 mg | ORAL_TABLET | Freq: Every day | ORAL | 11 refills | Status: DC
  Filled 2022-08-22: qty 30, 30d supply, fill #0
  Filled 2022-09-21: qty 30, 30d supply, fill #1
  Filled 2022-10-23: qty 30, 30d supply, fill #2
  Filled 2022-11-24: qty 30, 30d supply, fill #3
  Filled 2022-12-31: qty 30, 30d supply, fill #4
  Filled 2023-01-28: qty 30, 30d supply, fill #5
  Filled 2023-03-01: qty 30, 30d supply, fill #6
  Filled 2023-04-04: qty 30, 30d supply, fill #7
  Filled 2023-05-12: qty 30, 30d supply, fill #8
  Filled 2023-06-19: qty 30, 30d supply, fill #9
  Filled 2023-07-19: qty 30, 30d supply, fill #10
  Filled 2023-08-16: qty 30, 30d supply, fill #11

## 2022-08-25 ENCOUNTER — Other Ambulatory Visit: Payer: Self-pay

## 2022-08-31 ENCOUNTER — Other Ambulatory Visit: Payer: Self-pay | Admitting: Family Medicine

## 2022-08-31 DIAGNOSIS — R053 Chronic cough: Secondary | ICD-10-CM

## 2022-09-01 ENCOUNTER — Other Ambulatory Visit: Payer: Self-pay

## 2022-09-01 MED ORDER — CETIRIZINE HCL 10 MG PO TABS
10.0000 mg | ORAL_TABLET | Freq: Every day | ORAL | 0 refills | Status: DC
Start: 2022-09-01 — End: 2022-11-30
  Filled 2022-09-01: qty 30, 30d supply, fill #0
  Filled 2022-09-28: qty 30, 30d supply, fill #1
  Filled 2022-11-02: qty 30, 30d supply, fill #2

## 2022-09-02 ENCOUNTER — Ambulatory Visit (HOSPITAL_COMMUNITY)
Admission: RE | Admit: 2022-09-02 | Discharge: 2022-09-02 | Disposition: A | Discharge status: Home / Self Care | Payer: Medicaid Other | Source: Ambulatory Visit | Attending: Internal Medicine | Admitting: Internal Medicine

## 2022-09-02 DIAGNOSIS — I2510 Atherosclerotic heart disease of native coronary artery without angina pectoris: Secondary | ICD-10-CM | POA: Insufficient documentation

## 2022-09-02 LAB — BASIC METABOLIC PANEL WITH GFR
Anion gap: 6 (ref 5–15)
BUN: 15 mg/dL (ref 6–20)
CO2: 26 mmol/L (ref 22–32)
Calcium: 8.8 mg/dL — ABNORMAL LOW (ref 8.9–10.3)
Chloride: 101 mmol/L (ref 98–111)
Creatinine, Ser: 1.02 mg/dL (ref 0.61–1.24)
GFR, Estimated: >60 mL/min
Glucose, Bld: 105 mg/dL — ABNORMAL HIGH (ref 70–99)
Potassium: 3.9 mmol/L (ref 3.5–5.1)
Sodium: 133 mmol/L — ABNORMAL LOW (ref 135–145)

## 2022-09-23 ENCOUNTER — Other Ambulatory Visit: Payer: Self-pay

## 2022-10-09 ENCOUNTER — Telehealth (HOSPITAL_COMMUNITY): Payer: Self-pay | Admitting: Emergency Medicine

## 2022-10-09 ENCOUNTER — Telehealth (HOSPITAL_COMMUNITY): Payer: Self-pay

## 2022-10-09 NOTE — Telephone Encounter (Signed)
Patients cardiac mri was moved to 10/28/22. Unable to log into evicore. Just wanted to notify you.

## 2022-10-10 ENCOUNTER — Ambulatory Visit (HOSPITAL_COMMUNITY): Payer: Medicaid Other

## 2022-10-14 ENCOUNTER — Telehealth (HOSPITAL_COMMUNITY): Payer: Self-pay | Admitting: *Deleted

## 2022-10-14 NOTE — Telephone Encounter (Signed)
   CMRI

## 2022-10-15 ENCOUNTER — Other Ambulatory Visit: Payer: Self-pay

## 2022-10-20 ENCOUNTER — Other Ambulatory Visit: Payer: Self-pay

## 2022-10-27 ENCOUNTER — Telehealth (HOSPITAL_COMMUNITY): Payer: Self-pay | Admitting: *Deleted

## 2022-10-27 ENCOUNTER — Other Ambulatory Visit: Payer: Self-pay

## 2022-10-27 NOTE — Telephone Encounter (Signed)
Called pts insurance to request an appeal for denied CMRI. After being transferred 6 times and the last call was long I was told I can't start an appeal until the official denial letter has been sent to our office and the patient. the patient must sign a form that comes with denial allowing our office to speak on his behalf and request the appeal.   Multiple phone numbers given to from multiple representatives  [phone]

## 2022-10-28 ENCOUNTER — Encounter (HOSPITAL_COMMUNITY): Payer: Self-pay

## 2022-10-28 ENCOUNTER — Ambulatory Visit (HOSPITAL_COMMUNITY): Admission: RE | Admit: 2022-10-28 | Payer: Medicaid Other | Source: Ambulatory Visit

## 2022-10-29 ENCOUNTER — Other Ambulatory Visit: Payer: Self-pay

## 2022-10-29 ENCOUNTER — Other Ambulatory Visit: Payer: Self-pay | Admitting: Family Medicine

## 2022-10-29 MED ORDER — LEVOTHYROXINE SODIUM 50 MCG PO TABS
50.0000 ug | ORAL_TABLET | Freq: Every day | ORAL | 2 refills | Status: DC
  Filled 2022-10-29: qty 30, 30d supply, fill #0
  Filled 2022-11-30: qty 30, 30d supply, fill #1
  Filled 2022-12-27: qty 30, 30d supply, fill #2

## 2022-10-31 ENCOUNTER — Other Ambulatory Visit: Payer: Self-pay

## 2022-11-12 ENCOUNTER — Other Ambulatory Visit: Payer: Self-pay

## 2022-11-17 ENCOUNTER — Other Ambulatory Visit: Payer: Self-pay

## 2022-11-19 ENCOUNTER — Encounter (HOSPITAL_COMMUNITY): Payer: Self-pay

## 2022-11-19 ENCOUNTER — Ambulatory Visit (HOSPITAL_COMMUNITY): Admission: RE | Admit: 2022-11-19 | Payer: Medicaid Other | Source: Ambulatory Visit

## 2022-11-30 ENCOUNTER — Other Ambulatory Visit: Payer: Self-pay | Admitting: Family Medicine

## 2022-11-30 DIAGNOSIS — R053 Chronic cough: Secondary | ICD-10-CM

## 2022-12-01 ENCOUNTER — Other Ambulatory Visit: Payer: Self-pay

## 2022-12-01 MED ORDER — CETIRIZINE HCL 10 MG PO TABS
10.0000 mg | ORAL_TABLET | Freq: Every day | ORAL | 2 refills | Status: DC
Start: 2022-12-01 — End: 2023-08-30
  Filled 2022-12-01: qty 30, 30d supply, fill #0
  Filled 2022-12-28: qty 30, 30d supply, fill #1
  Filled 2023-02-01 – 2023-02-02 (×2): qty 30, 30d supply, fill #2
  Filled 2023-03-01: qty 30, 30d supply, fill #3
  Filled 2023-03-29: qty 30, 30d supply, fill #4
  Filled 2023-04-26: qty 30, 30d supply, fill #5
  Filled 2023-05-31: qty 30, 30d supply, fill #6
  Filled 2023-06-28: qty 30, 30d supply, fill #7
  Filled 2023-08-02: qty 30, 30d supply, fill #8

## 2022-12-01 NOTE — Telephone Encounter (Signed)
Requested Prescriptions  Pending Prescriptions Disp Refills   cetirizine (ZYRTEC) 10 MG tablet 90 tablet 2    Sig: Take 1 tablet (10 mg total) by mouth daily.     Ear, Nose, and Throat:  Antihistamines 2 Passed - 11/30/2022 12:52 PM      Passed - Cr in normal range and within 360 days    Creatinine, Ser  Date Value Ref Range Status  09/02/2022 1.02 0.61 - 1.24 mg/dL Final         Passed - Valid encounter within last 12 months    Recent Outpatient Visits           4 months ago Pre-diabetes   [place] [facility] [place], Odette Horns, MD   10 months ago Chronic HFrEF (heart failure with reduced ejection fraction) [facility])   [place] [facility] Hoy Register, MD   1 year ago Pre-diabetes   [place] [facility] Hoy Register, MD   3 years ago S/P CABG x 3   [place] [facility] [place], Odette Horns, MD   3 years ago 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

## 2022-12-03 ENCOUNTER — Other Ambulatory Visit: Payer: Self-pay

## 2022-12-15 ENCOUNTER — Other Ambulatory Visit: Payer: Self-pay

## 2022-12-31 ENCOUNTER — Other Ambulatory Visit: Payer: Self-pay

## 2023-01-02 ENCOUNTER — Other Ambulatory Visit: Payer: Self-pay

## 2023-01-18 IMAGING — CR DG CHEST 2V
2 series · 2 of 2 positions shown · non-contrast
Comparison: 01/26/2019

CLINICAL DATA: Cough for 1 week.

EXAM:
CHEST - 2 VIEW

[chest pa]
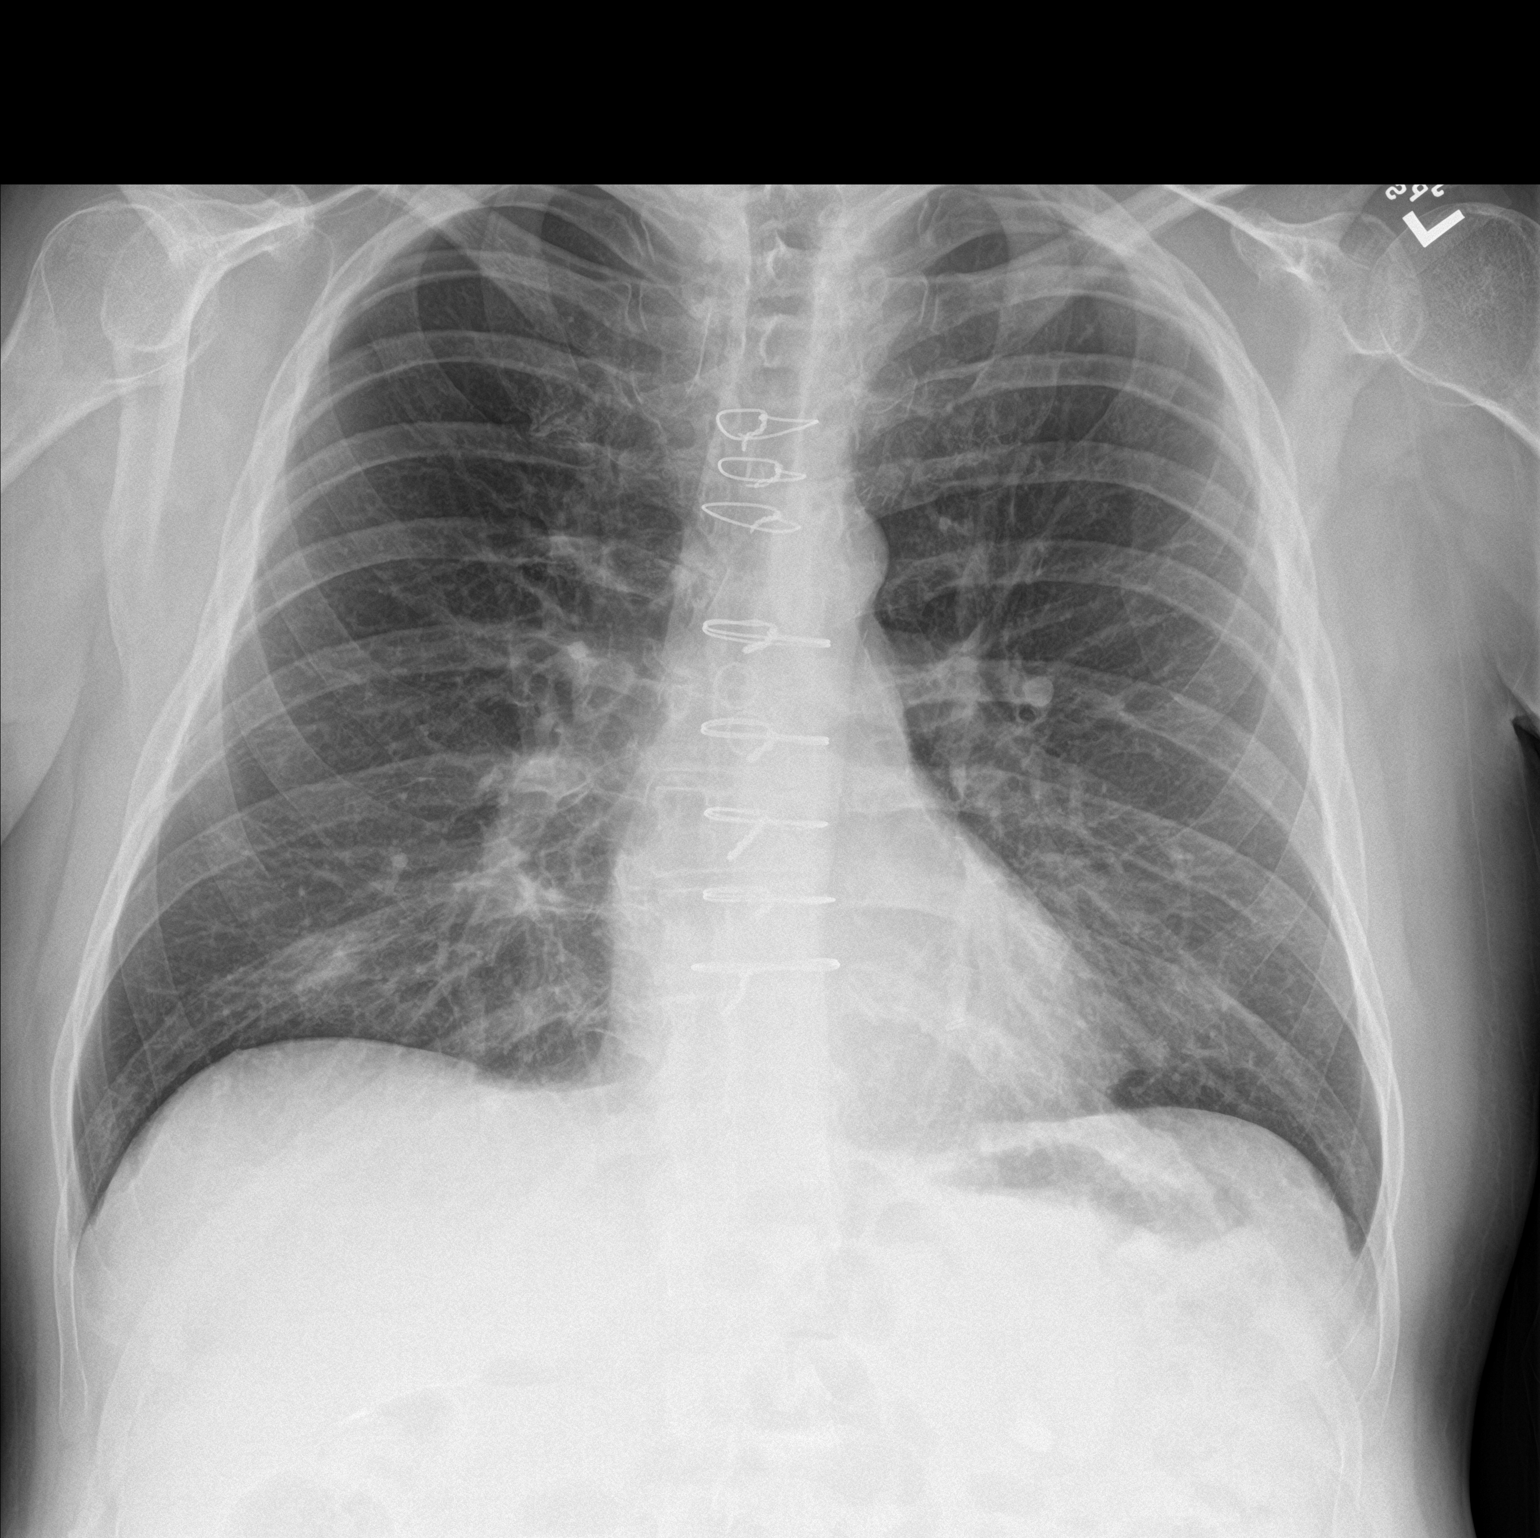

[chest lat]
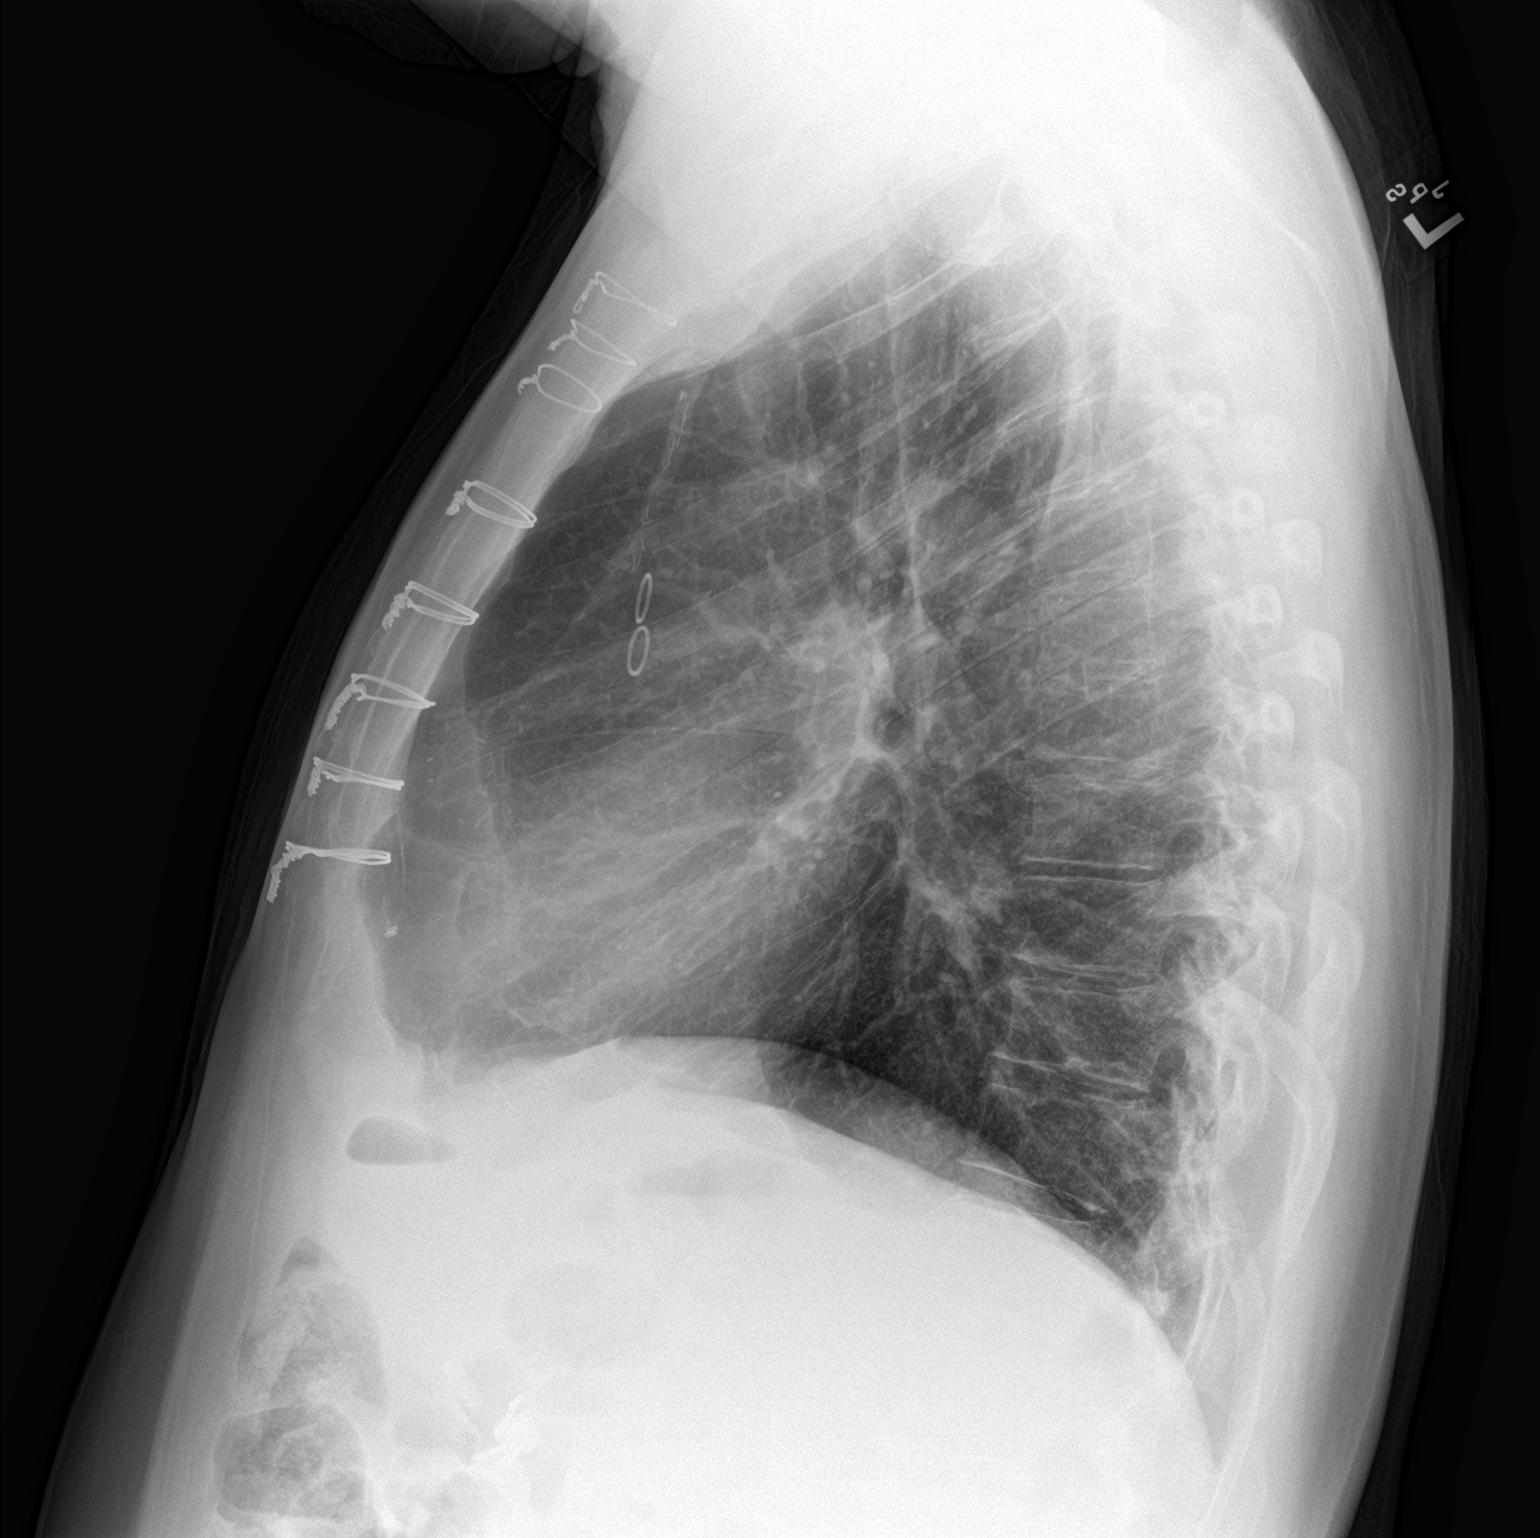

[2 of 2 positions shown; findings below may reference images not displayed]

FINDINGS: Prior median sternotomy and evidence for CABG procedure. Both lungs
are clear. No pulmonary edema. No pleural effusions. Heart size is
normal. Negative for a pneumothorax. No acute bone abnormality.
IMPRESSION: No active cardiopulmonary disease.

## 2023-01-19 ENCOUNTER — Other Ambulatory Visit: Payer: Self-pay

## 2023-01-28 ENCOUNTER — Other Ambulatory Visit: Payer: Self-pay | Admitting: Family Medicine

## 2023-01-28 ENCOUNTER — Ambulatory Visit (HOSPITAL_COMMUNITY): Payer: Medicaid Other

## 2023-01-28 ENCOUNTER — Encounter (HOSPITAL_COMMUNITY): Payer: Self-pay

## 2023-01-29 ENCOUNTER — Other Ambulatory Visit: Payer: Self-pay

## 2023-01-29 MED ORDER — LEVOTHYROXINE SODIUM 50 MCG PO TABS
50.0000 ug | ORAL_TABLET | Freq: Every day | ORAL | 0 refills | Status: DC
  Filled 2023-01-29: qty 30, 30d supply, fill #0

## 2023-01-29 NOTE — Telephone Encounter (Signed)
Requested medications are due for refill today.  yes  Requested medications are on the active medications list.  yes  Last refill. 10/29/2022 #30 2 rf  Future visit scheduled.   no  Notes to clinic.  Abnormal labs.    Requested Prescriptions  Pending Prescriptions Disp Refills   levothyroxine (SYNTHROID) 50 MCG tablet 30 tablet 2    Sig: Take 1 tablet (50 mcg total) by mouth daily before breakfast.     Endocrinology:  Hypothyroid Agents Failed - 01/28/2023 11:49 AM      Failed - TSH in normal range and within 360 days    TSH  Date Value Ref Range Status  07/08/2022 7.860 (H) 0.450 - 4.500 uIU/mL Final         Passed - Valid encounter within last 12 months    Recent Outpatient Visits           6 months ago Pre-diabetes   [place] [facility] [place], Odette Horns, MD   1 year ago Chronic HFrEF (heart failure with reduced ejection fraction) [facility]   [place] [facility] Hoy Register, MD   1 year ago Pre-diabetes   [facility] Hoy Register, MD   3 years ago S/P CABG x 3   [place] [place], Odette Horns, MD   4 years ago 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 [facility])   [facility] Hoy Register, MD

## 2023-01-30 ENCOUNTER — Other Ambulatory Visit: Payer: Self-pay | Admitting: Family Medicine

## 2023-01-30 ENCOUNTER — Other Ambulatory Visit: Payer: Self-pay

## 2023-01-30 DIAGNOSIS — Z1212 Encounter for screening for malignant neoplasm of rectum: Secondary | ICD-10-CM

## 2023-01-30 DIAGNOSIS — Z1211 Encounter for screening for malignant neoplasm of colon: Secondary | ICD-10-CM

## 2023-02-02 ENCOUNTER — Other Ambulatory Visit: Payer: Self-pay

## 2023-02-10 NOTE — Progress Notes (Signed)
Advanced Heart Failure Clinic Note  PCP: Hoy Register, MD CT Surgery: Dr Donata Clay HF Cardiologist: Dr. Gala Romney  HPI: 57 y.o. 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   Admitted with increase shortness of breath. Echo 12/11/18 LVEF 15-20% with moderate RV dysfunction .Chest CT 8/17  with suggestion of severe CAD with totally occluded LAD and high-grade ramus lesion. Also LLL nodule suggestive of lung CA (versus infection). He underwent CABG x3 on 12/21/18. Post operative course was uneventful. He was discharged to home with [facility] on 12/30/2018.   Had follow up with CT surgery. Plan for CT scan of chest to reassess possible mass in a couple of months. Amio was stopped at that visit.   CT chest 11/20 resolution of infiltrate/nodule  Zio 6/23 placed for PVCs 1. Sinus rhythm - avg HR of 75 bpm.  2. 12 runs if nonsustained Ventricular Tachycardia occurred, the run with the fastest interval lasting 4 beats with a max rate of 176 bpm, the longest lasting 5 beats with an avg rate of 132 bpm. 3. Isolated PACs were occasional (1.3%, 8344) 4. Isolated PVCs were frequent (8.5%, 55250) with two promary morphologies (5.9%, 2,7%) 5. Ventricular Bigeminy and Trigeminy were present. 6. No patient-triggered events were present.   Recently had PFTs for cough FEV1 4.13 L (102%) FVC 5.22  L (99%) DLCO 53%  Follow up 9/23. NYHA II, volume ok. HiRes CT ordered with low DLCO on PFTs, referred to [facility]. Mexiletine started with frequent PVCs.  Follow up with Pulmonary 4/24, pattern of CT not consistent with ILD. PFTs (3/24) FEV1 3.96 L (98%), FVC 4.76 L (90%), DLCO 78%.  Follow up 3/24, frequent PVCs on ECG and mexiletine increased to 300 bid. Arranged for cMRI to ensure no infiltrative disease causing PVCs.  Today he returns for HF follow up. Overall feeling fine. 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 He has  no dyspnea with walking up steps or carrying items. Denies palpitations, CP, dizziness, edema, or PND/Orthopnea. Appetite ok. No fever or chills. He does not weigh at home. Off Entresto and [place] due to cost. No ETOH/tobacco/drugs.  Cardiac Studies: - Echo 6/23: EF 50-55% - Echo 1/22: EF 45-50%, RV okay, mild to moderate MR. - Echo 2/21: 30-35%, RV mildly reduced. - Echo 8/20: EF 15%. RV moderately reduced.   - RHC/LHC 12/16/18 Ao = 88/51 (68) LV = 86/17 RA = 3 RV = 46/5 PA = 45/10 (23) PCW = 17 Fick cardiac output/index = 4.2/2.0 PVR = 1.5 WU Ao sat = 98% PA sat = 67%, 68%   Assessment: 1. 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 Well-compensated filling pressures with moderately reduced CO  ROS: All systems negative except as listed in HPI, PMH and Problem List.  SH:  Social History   Socioeconomic History   Marital status: Single    Spouse name: Not on file   Number of children: Not on file   Years of education: Not on file   Highest education level: Not on file  Occupational History   Not on file  Tobacco Use   Smoking status: Never   Smokeless tobacco: Never  Vaping Use   Vaping status: Never Used  Substance and Sexual Activity   Alcohol use: Yes    Comment: occ   Drug use: No   Sexual activity:  Not on file  Other Topics Concern   Not on file  Social History Narrative   Not on file   Social Determinants of Health   Financial Resource Strain: Not on file  Food Insecurity: Not on file  Transportation Needs: Not on file  Physical Activity: Not on file  Stress: Not on file  Social Connections: Not on file  Intimate Partner Violence: Not on file   FH:  Family History  Problem Relation Age of Onset   Parkinson's disease Mother     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   Current Outpatient Medications   Medication Sig Dispense Refill   aspirin EC 81 MG tablet Take 1 tablet (81 mg total) by mouth daily. 30 tablet 11   atorvastatin (LIPITOR) 80 MG tablet Take 1 tablet (80 mg total) by mouth daily. 30 tablet 11   carvedilol (COREG) 3.125 MG tablet Take 1 tablet (3.125 mg total) by mouth 2 (two) times daily. 60 tablet 11   cetirizine (ZYRTEC) 10 MG tablet Take 1 tablet (10 mg total) by mouth daily. 90 tablet 2   dapagliflozin propanediol (FARXIGA) 5 MG TABS tablet Take 1 tablet (5 mg total) by mouth daily before breakfast. 90 tablet 3   levothyroxine (SYNTHROID) 50 MCG tablet Take 1 tablet (50 mcg total) by mouth daily before breakfast. Please make appt with Dr. Alvis Lemmings! 30 tablet 0   mexiletine (MEXITIL) 150 MG capsule Take 2 capsules (300 mg total) by mouth 2 (two) times daily. 120 capsule 8   potassium chloride (KLOR-CON) 10 MEQ tablet Take 1 tablet (10 mEq total) by mouth daily. Needs appt for further refills 90 tablet 3   sacubitril-valsartan (ENTRESTO) 24-26 MG Take 1 tablet by mouth 2 (two) times daily. 180 tablet 3   spironolactone (ALDACTONE) 25 MG tablet Take 1 tablet (25 mg total) by mouth daily. 90 tablet 3   No current facility-administered medications for this visit.   There were no vitals taken for this visit.  Wt Readings from Last 3 Encounters:  08/19/22 83.6 kg (184 lb 6.4 oz)  07/23/22 84.8 kg (187 lb)  07/16/22 85.7 kg (189 lb)   PHYSICAL EXAM: General:  NAD. No resp difficulty HEENT: Normal Neck: Supple. No JVD. Carotids 2+ bilat; no bruits. No lymphadenopathy or thryomegaly appreciated. Cor: PMI nondisplaced. Regular rate & rhythm. No rubs, gallops or murmurs. Lungs: Clear Abdomen: Soft, nontender, nondistended. No hepatosplenomegaly. No bruits or masses. Good bowel sounds. Extremities: No cyanosis, clubbing, rash, edema Neuro: Alert & oriented x 3, cranial nerves grossly intact. Moves all 4 extremities w/o difficulty. Affect pleasant, + stutter  ECG: SR with PACs  and 1 PVC, rBBB (personally reviewed)  ASSESSMENT & PLAN: 1. CAD  - s/p CABG x3 on 12/21/18 - No chest pain. - Continue ASA, statin, & b-blocker  2. Chronic Systolic CHF  - ICM - Echo 8/20 EF 20-25%  - Echo 11/20 EF 20-25% - Echo 2/21 EF 30-35% - Echo 1/22 EF 45-50%, RV okay, mild to moderate MR - Echo 6/23 EF 50-55% - NYHA I-II. Volume status stable. He does not need lasix.  - Restart Entresto 24/26 mg bid $4/90-day supply - Restart Farxiga 5 mg daily $4/90-day supply - Continue carvedilol 3.125 mg bid.  - Continue spiro 25 mg daily. - Recent labs reviewed (07/08/22), K 4.4, SCr 1.04; repeat BMET in 2 weeks.  3. High  Burden PVCs:  ~ 20% burden initially. Suspect likely contributing to cardiomyopathy.  - suppressed on amio - Amio stopped 01/26/19. PVCs restarted 11/20 - Zio 2/21 no AF, mostly in SR w/ 1st degree HB, some ventricular bigeminy and trigeminy present - Off amio as of 1/23 - Zio 6/23: Isolated PVCs were frequent (8.5%, 55250) with two promary morphologies (5.9%, 2,7%) - Mexiletine 200 mg bid started 9/23, increased to 300 bid (3/24) due to increased PVC on ECG - Continue mexiletine 300 mg bid. - Repeat Zio 2 week to ensure PVCs adequately suppressed - cMRI (scheduled 10/10/22) to ensure no infiltrative diseases causing PVCs. - Sleep study arranged.  4. Atrial Fibrillation, post op:  - In NSR today.  - No recurrence  5. Decreased DLCO - HiRes CT (9/23) concerning for ILD - ANA + 1:80, Rheumatoid factor negative, other markers of inflammation/vasculitis negative. - Repeat CT (3/24) not suggestive of ILD - Followed by Pulmonary  6. Hypothyroid - Recent TSH 7.8, Free T3 138, Free T4 0.86 - Back on levothyroxine  Follow up in 4 months with Dr. Gala Romney.  [place] Karime Scheuermann FNP-BC 5:09 PM

## 2023-02-11 ENCOUNTER — Ambulatory Visit (HOSPITAL_COMMUNITY)
Admission: RE | Admit: 2023-02-11 | Discharge: 2023-02-11 | Disposition: A | Discharge status: Home / Self Care | Payer: Medicaid Other | Source: Ambulatory Visit | Attending: Family Medicine | Admitting: Family Medicine

## 2023-02-11 ENCOUNTER — Encounter (HOSPITAL_COMMUNITY): Payer: Self-pay

## 2023-02-11 VITALS — BP 92/60 | HR 51 | Wt 176.2 lb

## 2023-02-11 DIAGNOSIS — I480 Paroxysmal atrial fibrillation: Secondary | ICD-10-CM

## 2023-02-11 DIAGNOSIS — R911 Solitary pulmonary nodule: Secondary | ICD-10-CM | POA: Insufficient documentation

## 2023-02-11 DIAGNOSIS — I2510 Atherosclerotic heart disease of native coronary artery without angina pectoris: Secondary | ICD-10-CM | POA: Insufficient documentation

## 2023-02-11 DIAGNOSIS — I4891 Unspecified atrial fibrillation: Secondary | ICD-10-CM | POA: Insufficient documentation

## 2023-02-11 DIAGNOSIS — Z7982 Long term (current) use of aspirin: Secondary | ICD-10-CM | POA: Insufficient documentation

## 2023-02-11 DIAGNOSIS — R942 Abnormal results of pulmonary function studies: Secondary | ICD-10-CM

## 2023-02-11 DIAGNOSIS — I5022 Chronic systolic (congestive) heart failure: Secondary | ICD-10-CM | POA: Diagnosis not present

## 2023-02-11 DIAGNOSIS — I493 Ventricular premature depolarization: Secondary | ICD-10-CM | POA: Diagnosis not present

## 2023-02-11 DIAGNOSIS — Z9049 Acquired absence of other specified parts of digestive tract: Secondary | ICD-10-CM | POA: Diagnosis not present

## 2023-02-11 DIAGNOSIS — E039 Hypothyroidism, unspecified: Secondary | ICD-10-CM | POA: Diagnosis not present

## 2023-02-11 DIAGNOSIS — Z79899 Other long term (current) drug therapy: Secondary | ICD-10-CM | POA: Insufficient documentation

## 2023-02-11 DIAGNOSIS — Z951 Presence of aortocoronary bypass graft: Secondary | ICD-10-CM | POA: Diagnosis not present

## 2023-02-11 LAB — BASIC METABOLIC PANEL
Anion gap: 7 (ref 5–15)
BUN: 16 mg/dL (ref 6–20)
CO2: 26 mmol/L (ref 22–32)
Calcium: 9 mg/dL (ref 8.9–10.3)
Chloride: 100 mmol/L (ref 98–111)
Creatinine, Ser: 1.17 mg/dL (ref 0.61–1.24)
GFR, Estimated: >60 mL/min (ref >60)
Glucose, Bld: 132 mg/dL — ABNORMAL HIGH (ref 70–99)
Potassium: 4 mmol/L (ref 3.5–5.1)
Sodium: 133 mmol/L — ABNORMAL LOW (ref 135–145)

## 2023-02-11 LAB — BRAIN NATRIURETIC PEPTIDE: B Natriuretic Peptide: 32.4 pg/mL (ref 0.0–100.0)

## 2023-02-11 NOTE — Patient Instructions (Addendum)
Thank you for coming in today  If you had labs drawn today, any labs that are abnormal the clinic will call you No news is good news  You have been referred to EP clinic, their office will call you for further appointment details    Medications: No changes  Follow up appointments:  Your physician recommends that you schedule a follow-up appointment in:  6 months With Dr. Gala Romney You will receive a reminder letter in the mail a few months in advance. If you don't receive a letter, please call our office to schedule the follow-up appointment.  Your physician has requested that you have an echocardiogram. Echocardiography is a painless test that uses sound waves to create images of your heart. It provides your doctor with information about the size and shape of your heart and how well your heart's chambers and valves are working. This procedure takes approximately one hour. There are no restrictions for this procedure.       Do the following things EVERYDAY: Weigh yourself in the morning before breakfast. Write it down and keep it in a log. Take your medicines as prescribed Eat low salt foods--Limit salt (sodium) to 2000 mg per day.  Stay as active as you can everyday Limit all fluids for the day to less than 2 liters   At the Advanced [facility] Failure Clinic, you and your health needs are our priority. As part of our continuing mission to provide you with exceptional heart care, we have created designated Provider Care Teams. These Care Teams include your primary Cardiologist (physician) and Advanced Practice Providers (APPs- Physician Assistants and Nurse Practitioners) who all work together to provide you with the care you need, when you need it.   You may see any of the following providers on your designated Care Team at your next follow up: Dr Arvilla Meres Dr Marca Ancona Dr. Marcos Eke, NP Robbie Lis, [place] [facility] [place], [place] Brynda Peon, NP Karle Plumber, PharmD   Please be sure to bring in all your medications bottles to every appointment.    Thank you for choosing [facility]  If you have any questions or concerns before your next appointment please send [place] a message through [place] or call our office at [phone].    TO LEAVE A MESSAGE FOR THE NURSE SELECT OPTION 2, PLEASE LEAVE A MESSAGE INCLUDING: YOUR NAME DATE OF BIRTH CALL BACK NUMBER REASON FOR CALL**this is important as we prioritize the call backs  YOU WILL RECEIVE A CALL BACK THE SAME DAY AS LONG AS YOU CALL BEFORE 4:00 PM

## 2023-02-16 ENCOUNTER — Other Ambulatory Visit: Payer: Self-pay

## 2023-02-23 ENCOUNTER — Other Ambulatory Visit: Payer: Self-pay

## 2023-02-24 ENCOUNTER — Ambulatory Visit (HOSPITAL_COMMUNITY)
Admission: RE | Admit: 2023-02-24 | Discharge: 2023-02-24 | Disposition: A | Discharge status: Home / Self Care | Payer: Medicaid Other | Source: Ambulatory Visit | Attending: Internal Medicine | Admitting: Internal Medicine

## 2023-02-24 DIAGNOSIS — I340 Nonrheumatic mitral (valve) insufficiency: Secondary | ICD-10-CM | POA: Insufficient documentation

## 2023-02-24 DIAGNOSIS — I493 Ventricular premature depolarization: Secondary | ICD-10-CM | POA: Insufficient documentation

## 2023-02-24 DIAGNOSIS — Z951 Presence of aortocoronary bypass graft: Secondary | ICD-10-CM | POA: Insufficient documentation

## 2023-02-24 DIAGNOSIS — I5022 Chronic systolic (congestive) heart failure: Secondary | ICD-10-CM | POA: Diagnosis not present

## 2023-02-24 DIAGNOSIS — I472 Ventricular tachycardia, unspecified: Secondary | ICD-10-CM | POA: Insufficient documentation

## 2023-02-24 DIAGNOSIS — R0602 Shortness of breath: Secondary | ICD-10-CM | POA: Diagnosis not present

## 2023-02-24 LAB — ECHOCARDIOGRAM COMPLETE
Area-P 1/2: 3.17 cm2
Calc EF: 43.1 %
S' Lateral: 3.5 cm
Single Plane A2C EF: 39.6 %
Single Plane A4C EF: 43.8 %

## 2023-03-01 ENCOUNTER — Other Ambulatory Visit: Payer: Self-pay | Admitting: Family Medicine

## 2023-03-04 ENCOUNTER — Other Ambulatory Visit: Payer: Self-pay

## 2023-03-04 ENCOUNTER — Other Ambulatory Visit: Payer: Self-pay | Admitting: Family Medicine

## 2023-03-04 NOTE — Telephone Encounter (Signed)
Patient called stated he only has 2 pills left fo the levothyroxine (SYNTHROID) 50 MCG tablet and there are no available appts until the end of Jan. Please contact pt with a sooner appt so he is able to get his medication

## 2023-03-05 NOTE — Telephone Encounter (Signed)
Requested medication (s) are due for refill today: Yes  Requested medication (s) are on the active medication list: yes    Last refill: 01/29/23  #30  0 refills  Future visit scheduled no  Notes to clinic:Pt needs appt, attempted to reach, left VM to call back.   Requested Prescriptions  Pending Prescriptions Disp Refills   levothyroxine (SYNTHROID) 50 MCG tablet 30 tablet 0    Sig: Take 1 tablet (50 mcg total) by mouth daily before breakfast. Please make appt with Dr. Alvis Lemmings!     Endocrinology:  Hypothyroid Agents Failed - 03/05/2023  9:46 AM      Failed - TSH in normal range and within 360 days    TSH  Date Value Ref Range Status  07/08/2022 7.860 (H) 0.450 - 4.500 uIU/mL Final         Passed - Valid encounter within last 12 months    Recent Outpatient Visits           8 months ago Pre-diabetes   [facility] [place] Hoy Register, MD   1 year ago Chronic HFrEF (heart failure with reduced ejection fraction) [facility])   [facility] [place] Hoy Register, MD   1 year ago Pre-diabetes   [facility] [place] Hoy Register, MD   3 years ago S/P CABG x 3   [facility] [place]. [facility] Hoy Register, MD   4 years ago 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 [facility])   [place] Hoy Register, MD

## 2023-03-06 ENCOUNTER — Telehealth: Payer: Self-pay

## 2023-03-06 NOTE — Telephone Encounter (Signed)
Copied from CRM [id number]. Topic: General - Other >> Mar 05, 2023  3:32 PM Dondra Prader E wrote: Reason for CRM: Pt called to schedule an appt, wants to receive his levothyroxine refill that was denied. He has scheduled the next available appt with his PCP.  [facility] [address], [address] [place] [address] Phone: [phone] Fax: [phone]

## 2023-03-09 ENCOUNTER — Other Ambulatory Visit: Payer: Self-pay

## 2023-03-09 NOTE — Telephone Encounter (Signed)
Pt can wait until upcoming appointment

## 2023-03-17 ENCOUNTER — Other Ambulatory Visit: Payer: Self-pay

## 2023-03-17 ENCOUNTER — Ambulatory Visit: Payer: Medicaid Other | Attending: Family Medicine | Admitting: Family Medicine

## 2023-03-17 ENCOUNTER — Encounter: Payer: Self-pay | Admitting: Family Medicine

## 2023-03-17 VITALS — BP 95/62 | HR 88 | Ht 72.0 in | Wt 176.6 lb

## 2023-03-17 DIAGNOSIS — Z1211 Encounter for screening for malignant neoplasm of colon: Secondary | ICD-10-CM | POA: Diagnosis not present

## 2023-03-17 DIAGNOSIS — R7303 Prediabetes: Secondary | ICD-10-CM

## 2023-03-17 DIAGNOSIS — I493 Ventricular premature depolarization: Secondary | ICD-10-CM

## 2023-03-17 DIAGNOSIS — E039 Hypothyroidism, unspecified: Secondary | ICD-10-CM | POA: Diagnosis not present

## 2023-03-17 DIAGNOSIS — I5022 Chronic systolic (congestive) heart failure: Secondary | ICD-10-CM

## 2023-03-17 LAB — POCT GLYCOSYLATED HEMOGLOBIN (HGB A1C): HbA1c, POC (prediabetic range): 5.9 % (ref 5.7–6.4)

## 2023-03-17 MED ORDER — LEVOTHYROXINE SODIUM 50 MCG PO TABS
50.0000 ug | ORAL_TABLET | Freq: Every day | ORAL | 6 refills | Status: DC
  Filled 2023-03-17: qty 30, 30d supply, fill #0
  Filled 2023-04-12: qty 30, 30d supply, fill #1
  Filled 2023-05-12: qty 30, 30d supply, fill #2
  Filled 2023-06-10: qty 30, 30d supply, fill #3
  Filled 2023-07-07: qty 30, 30d supply, fill #4
  Filled 2023-08-11: qty 30, 30d supply, fill #5
  Filled 2023-09-11: qty 30, 30d supply, fill #6

## 2023-03-17 NOTE — Progress Notes (Signed)
Subjective:  Patient ID: Jonathan King, male    DOB: 1965-08-25  Age: 57 y.o. MRN: [id number]  CC: Medical Management of Chronic Issues (Medication refill)   HPI Jonathan King is a 57 y.o. year old male with a history of three-vessel CAD status post CABG, CHF EF 40 to 45%, LV global hypokinesis moderately reduced RV function, right ventricular dilatation from echo of 01/2023. ILD, Hypothyroidism.  Interval History: Discussed the use of AI scribe software for clinical note transcription with the patient, who gave verbal consent to proceed.  He presents for a medication refill. He reports running out of levothyroxine a few days ago.  Last TSH was elevated and he was scheduled to return for repeat labs after the dose of levothyroxine was adjusted but he never returned. His last echocardiogram from last month revealed decreased EF of 40 to 45% down from 50 to 55% previously.  Last cardiology visit was last month and per notes he was being referred to EP for cardioversion due to increased PVC burden.  He denies symptoms such as shortness of breath, chest pain, or leg swelling.   He is due for colon cancer screening.  He is also due for a shingles vaccine, but has declined it at this time.        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    Past Surgical History:  Procedure Laterality Date   CARDIAC SURGERY     CHOLECYSTECTOMY  01/31/2012   Procedure: LAPAROSCOPIC CHOLECYSTECTOMY;  Surgeon: Atilano Ina, MD,FACS;  Location: [facility] OR;  Service: General;  Laterality: N/A;   CORONARY ARTERY BYPASS GRAFT N/A 12/21/2018   Procedure: CORONARY ARTERY BYPASS GRAFTING (CABG) x 3, USING LEFT INTERNAL MAMMARY ARTERY (LIMA) AND RIGHT GREATER SAPHENOUS VEIN (SVG) HARVESTED ENDOSCOPICALLY;    LIMA-LAD, SVG-RAMUS, SVG-DIAG;  Surgeon: Kerin Perna, MD;  Location: [facility] OR;  Service: Open Heart Surgery;  Laterality: N/A;    RIGHT/LEFT HEART CATH AND CORONARY ANGIOGRAPHY N/A 12/16/2018   Procedure: RIGHT/LEFT HEART CATH AND CORONARY ANGIOGRAPHY;  Surgeon: Dolores Patty, MD;  Location: [facility] INVASIVE CV LAB;  Service: Cardiovascular;  Laterality: N/A;   TEE WITHOUT CARDIOVERSION N/A 12/21/2018   Procedure: TRANSESOPHAGEAL ECHOCARDIOGRAM (TEE);  Surgeon: Donata Clay, Theron Arista, MD;  Location: [facility] OR;  Service: Open Heart Surgery;  Laterality: N/A;    Family History  Problem Relation Age of Onset   Parkinson's disease Mother     Social History   Socioeconomic History   Marital status: Single    Spouse name: Not on file   Number of children: Not on file   Years of education: Not on file   Highest education level: Not on file  Occupational History   Not on file  Tobacco Use   Smoking status: Never   Smokeless tobacco: Never  Vaping Use   Vaping status: Never Used  Substance and Sexual Activity   Alcohol use: Yes    Comment: occ   Drug use: No   Sexual activity: Not on file  Other Topics Concern   Not on file  Social History Narrative   Not on file   Social Determinants of Health   Financial Resource Strain: Low Risk  (03/17/2023)   Overall Financial Resource Strain (CARDIA)    Difficulty of Paying Living Expenses: Not hard at all  Food Insecurity: No Food Insecurity (03/17/2023)   Hunger  Vital Sign    Worried About Programme researcher, broadcasting/film/video in the Last Year: Never true    Ran Out of Food in the Last Year: Never true  Transportation Needs: No Transportation Needs (03/17/2023)   PRAPARE - Administrator, Civil Service (Medical): No    Lack of Transportation (Non-Medical): No  Physical Activity: Inactive (03/17/2023)   Exercise Vital Sign    Days of Exercise per Week: 0 days    Minutes of Exercise per Session: 0 min  Stress: No Stress Concern Present (03/17/2023)   [employer] of Occupational Health - Occupational Stress Questionnaire    Feeling of Stress : Not at all  Social  Connections: Socially Isolated (03/17/2023)   Social Connection and Isolation Panel [NHANES]    Frequency of Communication with Friends and Family: Once a week    Frequency of Social Gatherings with Friends and Family: Never    Attends Religious Services: Never    Database administrator or Organizations: No    Attends Engineer, structural: Never    Marital Status: Divorced    No Known Allergies  Outpatient Medications Prior to Visit  Medication Sig Dispense Refill   aspirin EC 81 MG tablet Take 1 tablet (81 mg total) by mouth daily. 30 tablet 11   atorvastatin (LIPITOR) 80 MG tablet Take 1 tablet (80 mg total) by mouth daily. 30 tablet 11   carvedilol (COREG) 3.125 MG tablet Take 1 tablet (3.125 mg total) by mouth 2 (two) times daily. 60 tablet 11   cetirizine (ZYRTEC) 10 MG tablet Take 1 tablet (10 mg total) by mouth daily. 90 tablet 2   dapagliflozin propanediol (FARXIGA) 5 MG TABS tablet Take 1 tablet (5 mg total) by mouth daily before breakfast. 90 tablet 3   mexiletine (MEXITIL) 150 MG capsule Take 2 capsules (300 mg total) by mouth 2 (two) times daily. 120 capsule 8   potassium chloride (KLOR-CON) 10 MEQ tablet Take 1 tablet (10 mEq total) by mouth daily. Needs appt for further refills 90 tablet 3   sacubitril-valsartan (ENTRESTO) 24-26 MG Take 1 tablet by mouth 2 (two) times daily. 180 tablet 3   spironolactone (ALDACTONE) 25 MG tablet Take 1 tablet (25 mg total) by mouth daily. 90 tablet 3   levothyroxine (SYNTHROID) 50 MCG tablet Take 1 tablet (50 mcg total) by mouth daily before breakfast. Please make appt with Dr. Alvis Lemmings! 30 tablet 0   No facility-administered medications prior to visit.     ROS Review of Systems  Constitutional:  Negative for activity change and appetite change.  HENT:  Negative for sinus pressure and sore throat.   Respiratory:  Negative for chest tightness, shortness of breath and wheezing.   Cardiovascular:  Negative for chest pain and  palpitations.  Gastrointestinal:  Negative for abdominal distention, abdominal pain and constipation.  Genitourinary: Negative.   Musculoskeletal: Negative.   Psychiatric/Behavioral:  Negative for behavioral problems and dysphoric mood.     Objective:  BP 95/62   Pulse 88   Ht 6' (1.829 m)   Wt 176 lb 9.6 oz (80.1 kg)   SpO2 96%   BMI 23.95 kg/m      03/17/2023   11:36 AM 03/17/2023   11:12 AM 02/11/2023    3:30 PM  BP/Weight  Systolic BP 95 88 92  Diastolic BP 62 54 60  Wt. (Lbs)  176.6 176.2  BMI  23.95 kg/m2 23.9 kg/m2      Physical Exam Constitutional:  Appearance: He is well-developed.  Cardiovascular:     Rate and Rhythm: Normal rate.     Heart sounds: Normal heart sounds. No murmur heard. Pulmonary:     Effort: Pulmonary effort is normal.     Breath sounds: Normal breath sounds. No wheezing or rales.  Chest:     Chest wall: No tenderness.  Abdominal:     General: Bowel sounds are normal. There is no distension.     Palpations: Abdomen is soft. There is no mass.     Tenderness: There is no abdominal tenderness.  Musculoskeletal:        General: Normal range of motion.     Right lower leg: No edema.     Left lower leg: No edema.  Neurological:     Mental Status: He is alert and oriented to person, place, and time.  Psychiatric:        Mood and Affect: Mood normal.        Latest Ref Rng & Units 02/11/2023    4:04 PM 09/02/2022   12:08 PM 07/08/2022    3:13 PM  CMP  Glucose 70 - 99 mg/dL 161  096  91   BUN 6 - 20 mg/dL 16  15  15    Creatinine 0.61 - 1.24 mg/dL 0.45  4.09  8.11   Sodium 135 - 145 mmol/L 133  133  138   Potassium 3.5 - 5.1 mmol/L 4.0  3.9  4.4   Chloride 98 - 111 mmol/L 100  101  99   CO2 22 - 32 mmol/L 26  26  23    Calcium 8.9 - 10.3 mg/dL 9.0  8.8  9.7   Total Protein 6.0 - 8.5 g/dL   7.8   Total Bilirubin 0.0 - 1.2 mg/dL   0.7   Alkaline Phos 44 - 121 IU/L   124   AST 0 - 40 IU/L   27   ALT 0 - 44 IU/L   33     Lipid  Panel     Component Value Date/Time   CHOL 77 09/13/2021 1542   TRIG 103 09/13/2021 1542   HDL 28 (L) 09/13/2021 1542   CHOLHDL 2.8 09/13/2021 1542   VLDL 21 09/13/2021 1542   LDLCALC 28 09/13/2021 1542    CBC    Component Value Date/Time   WBC 7.8 07/23/2022 1038   RBC 4.62 07/23/2022 1038   HGB 13.9 07/23/2022 1038   HCT 42.7 07/23/2022 1038   PLT 230 07/23/2022 1038   MCV 92.4 07/23/2022 1038   MCH 30.1 07/23/2022 1038   MCHC 32.6 07/23/2022 1038   RDW 13.7 07/23/2022 1038   LYMPHSABS 2.3 12/19/2018 0555   MONOABS 0.8 12/19/2018 0555   EOSABS 0.3 12/19/2018 0555   BASOSABS 0.1 12/19/2018 0555    Lab Results  Component Value Date   HGBA1C 5.9 03/17/2023    Lab Results  Component Value Date   TSH 7.860 (H) 07/08/2022    Assessment & Plan:      Hypothyroidism Patient ran out of levothyroxine a few days ago. Last thyroid test in March was slightly abnormal. -Refill levothyroxine and instruct patient to take daily. -Schedule thyroid test in one month (April 14, 2023) to assess levels after resumption of medication.  HFrEF EF of 40 to 45% decreased from 50 to 55% previously He is euvolemic Continue Entresto, SGLT2i, beta-blocker, spironolactone, mexiletine  PVCs Previously on amiodarone which was discontinued He is currently in sinus rhythm Referred  to EP per cardiology note  Colon Cancer Screening Patient due for screening. -Order Cologuard test and send kit to patient's home for stool specimen collection.  General healthcare maintenance Patient due for vaccination but declined.   Follow-up in six months.          Meds ordered this encounter  Medications   levothyroxine (SYNTHROID) 50 MCG tablet    Sig: Take 1 tablet (50 mcg total) by mouth daily before breakfast.    Dispense:  30 tablet    Refill:  6    Follow-up: Return in about 6 months (around 09/14/2023) for Chronic medical conditions.       Hoy Register, MD,  FAAFP. [facility] [place], [place] [phone]   03/17/2023, 11:37 AM

## 2023-03-17 NOTE — Patient Instructions (Addendum)
VISIT SUMMARY:  You came in today for a medication refill and to discuss your ongoing health management. You reported running out of your thyroid medication, levothyroxine, a few days ago. You also opted for a stool card test for colon cancer screening and declined the shingles vaccine at this time.  YOUR PLAN:  -HYPOTHYROIDISM: Hypothyroidism is a condition where your thyroid gland does not produce enough thyroid hormone. You ran out of your levothyroxine medication a few days ago. We have refilled your prescription, and you should take it daily. We will schedule a thyroid test in one month (April 14, 2023) to check your hormone levels after you resume taking the medication.  -COLON CANCER SCREENING: Colon cancer screening is a preventive measure to detect early signs of colon cancer. You are due for this screening, so we have ordered a Cologuard test kit, which will be sent to your home. Please follow the instructions to collect a stool specimen and send it back for analysis.  -SHINGLES VACCINATION: The shingles vaccine helps prevent shingles, a painful rash caused by the varicella-zoster virus. You are due for this vaccination but have chosen to decline it at this time. No further action is needed unless you decide to get the vaccine in the future.  -CAD/CHF: Continue to follow-up with your cardiologist for management.  INSTRUCTIONS:  Please follow up in six months for a routine check-up. Additionally, make sure to complete your thyroid test on April 14, 2023, and send back the Cologuard test kit as soon as you can.

## 2023-03-19 ENCOUNTER — Other Ambulatory Visit (HOSPITAL_BASED_OUTPATIENT_CLINIC_OR_DEPARTMENT_OTHER): Payer: Self-pay

## 2023-03-19 DIAGNOSIS — R053 Chronic cough: Secondary | ICD-10-CM

## 2023-03-19 DIAGNOSIS — R0602 Shortness of breath: Secondary | ICD-10-CM

## 2023-03-20 ENCOUNTER — Ambulatory Visit (HOSPITAL_BASED_OUTPATIENT_CLINIC_OR_DEPARTMENT_OTHER): Payer: Medicaid Other | Admitting: Pulmonary Disease

## 2023-03-20 DIAGNOSIS — R0602 Shortness of breath: Secondary | ICD-10-CM

## 2023-03-20 DIAGNOSIS — R053 Chronic cough: Secondary | ICD-10-CM

## 2023-03-20 LAB — PULMONARY FUNCTION TEST
DL/VA % pred: 85 %
DL/VA: 3.64 ml/min/mmHg/L
DLCO cor % pred: 85 %
DLCO cor: 24.76 ml/min/mmHg
DLCO unc % pred: 85 %
DLCO unc: 24.76 ml/min/mmHg
FEF 25-75 Post: 4.9 L/s
FEF 25-75 Pre: 4.05 L/s
FEF2575-%Change-Post: 21 %
FEF2575-%Pred-Post: 152 %
FEF2575-%Pred-Pre: 125 %
FEV1-%Change-Post: 4 %
FEV1-%Pred-Post: 108 %
FEV1-%Pred-Pre: 104 %
FEV1-Post: 4.17 L
FEV1-Pre: 3.99 L
FEV1FVC-%Change-Post: 1 %
FEV1FVC-%Pred-Pre: 106 %
FEV6-%Change-Post: 2 %
FEV6-%Pred-Post: 104 %
FEV6-%Pred-Pre: 102 %
FEV6-Post: 5.06 L
FEV6-Pre: 4.93 L
FEV6FVC-%Change-Post: 0 %
FEV6FVC-%Pred-Post: 104 %
FEV6FVC-%Pred-Pre: 104 %
FVC-%Change-Post: 2 %
FVC-%Pred-Post: 100 %
FVC-%Pred-Pre: 98 %
FVC-Post: 5.06 L
FVC-Pre: 4.93 L
Post FEV1/FVC ratio: 82 %
Post FEV6/FVC ratio: 100 %
Pre FEV1/FVC ratio: 81 %
Pre FEV6/FVC Ratio: 100 %
RV % pred: 50 %
RV: 1.13 L
TLC % pred: 88 %
TLC: 6.33 L

## 2023-03-20 NOTE — Patient Instructions (Signed)
Full PFT Performed Today  

## 2023-03-20 NOTE — Progress Notes (Signed)
Full PFT Performed Today  

## 2023-03-25 ENCOUNTER — Ambulatory Visit: Payer: Medicaid Other | Attending: Internal Medicine | Admitting: Internal Medicine

## 2023-03-25 ENCOUNTER — Encounter: Payer: Self-pay | Admitting: Internal Medicine

## 2023-03-25 VITALS — BP 85/60 | HR 104 | Ht 72.0 in | Wt 178.0 lb

## 2023-03-25 DIAGNOSIS — I493 Ventricular premature depolarization: Secondary | ICD-10-CM

## 2023-03-25 NOTE — Patient Instructions (Addendum)
Medication Instructions:  Your physician recommends that you continue on your current medications as directed. Please refer to the Current Medication list given to you today.  *If you need a refill on your cardiac medications before your next appointment, please call your pharmacy*  Lab Work: None ordered.  If you have labs (blood work) drawn today and your tests are completely normal, you will receive your results only by: MyChart Message (if you have MyChart) OR A paper copy in the mail If you have any lab test that is abnormal or we need to change your treatment, we will call you to review the results.  Testing/Procedures: None ordered.  Follow-Up: At [facility], you and your health needs are our priority.  As part of our continuing mission to provide you with exceptional heart care, we have created designated Provider Care Teams.  These Care Teams include your primary Cardiologist (physician) and Advanced Practice Providers (APPs -  Physician Assistants and Nurse Practitioners) who all work together to provide you with the care you need, when you need it.   Your next appointment:   PRN  The format for your next appointment:   In Person  Provider:   Lewayne Bunting, MD{or one of the following Advanced Practice Providers on your designated Care Team:   Francis Dowse, [place] Casimiro Needle "Mardelle Matte" [place], [place] Earnest Rosier, NP  Important Information About Sugar

## 2023-03-25 NOTE — Progress Notes (Signed)
HPI Jonathan King is referred by Prince Rome, NP for evaluation of PVC's. 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 He was treated with amio and CABG. He was switched to Mexitil about 2 years ago after the amio was stopped. He does not have palpitations. He is asymptomatic with regard to his PVC's. His most recent Zio monitor demonstrates NSVT and 12% PVC's. Review of his ECG demonstrates a PVC morphology which is narrow with a left bundle pattern and a left ward axis. Transition at V2. He has not had syncope and most recent echo show an EF of 40-45%.   No Known Allergies   Current Outpatient Medications  Medication Sig Dispense Refill   aspirin EC 81 MG tablet Take 1 tablet (81 mg total) by mouth daily. 30 tablet 11   atorvastatin (LIPITOR) 80 MG tablet Take 1 tablet (80 mg total) by mouth daily. 30 tablet 11   carvedilol (COREG) 3.125 MG tablet Take 1 tablet (3.125 mg total) by mouth 2 (two) times daily. 60 tablet 11   cetirizine (ZYRTEC) 10 MG tablet Take 1 tablet (10 mg total) by mouth daily. 90 tablet 2   dapagliflozin propanediol (FARXIGA) 5 MG TABS tablet Take 1 tablet (5 mg total) by mouth daily before breakfast. 90 tablet 3   levothyroxine (SYNTHROID) 50 MCG tablet Take 1 tablet (50 mcg total) by mouth daily before breakfast. 30 tablet 6   mexiletine (MEXITIL) 150 MG capsule Take 2 capsules (300 mg total) by mouth 2 (two) times daily. 120 capsule 8   potassium chloride (KLOR-CON) 10 MEQ tablet Take 1 tablet (10 mEq total) by mouth daily. Needs appt for further refills 90 tablet 3   sacubitril-valsartan (ENTRESTO) 24-26 MG Take 1 tablet by mouth 2 (two) times daily. 180 tablet 3   spironolactone (ALDACTONE) 25 MG tablet Take 1 tablet (25 mg total) by mouth daily. 90 tablet 3   No current facility-administered medications for this visit.     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   Past Surgical History:  Procedure Laterality Date   CARDIAC SURGERY     CHOLECYSTECTOMY  01/31/2012   Procedure: LAPAROSCOPIC CHOLECYSTECTOMY;  Surgeon: Atilano Ina, MD,FACS;  Location: [facility] OR;  Service: General;  Laterality: N/A;   CORONARY ARTERY BYPASS GRAFT N/A 12/21/2018   Procedure: CORONARY ARTERY BYPASS GRAFTING (CABG) x 3, USING LEFT INTERNAL MAMMARY ARTERY (LIMA) AND RIGHT GREATER SAPHENOUS VEIN (SVG) HARVESTED ENDOSCOPICALLY;    LIMA-LAD, SVG-RAMUS, SVG-DIAG;  Surgeon: Kerin Perna, MD;  Location: [facility] OR;  Service: Open Heart Surgery;  Laterality: N/A;   RIGHT/LEFT HEART CATH AND CORONARY ANGIOGRAPHY N/A 12/16/2018   Procedure: RIGHT/LEFT HEART CATH AND CORONARY ANGIOGRAPHY;  Surgeon: Dolores Patty, MD;  Location: [facility] INVASIVE CV LAB;  Service: Cardiovascular;  Laterality: N/A;   TEE WITHOUT CARDIOVERSION N/A 12/21/2018   Procedure: TRANSESOPHAGEAL ECHOCARDIOGRAM (TEE);  Surgeon: Donata Clay, Theron Arista, MD;  Location[facility] OR;  Service: Open Heart Surgery;  Laterality: N/A;     Family History  Problem Relation Age of Onset   Parkinson's disease Mother      Social History   Socioeconomic History   Marital status: Single    Spouse name: Not on  file   Number of children: Not on file   Years of education: Not on file   Highest education level: Not on file  Occupational History   Not on file  Tobacco Use   Smoking status: Never   Smokeless tobacco: Never  Vaping Use   Vaping status: Never Used  Substance and Sexual Activity   Alcohol use: Yes    Comment: occ   Drug use: No   Sexual activity: Not on file  Other Topics Concern   Not on file  Social History Narrative   Not on file   Social Determinants of Health   Financial Resource Strain: Low Risk  (03/17/2023)   Overall Financial Resource Strain (CARDIA)    Difficulty of Paying Living Expenses: Not  hard at all  Food Insecurity: No Food Insecurity (03/17/2023)   Hunger Vital Sign    Worried About Running Out of Food in the Last Year: Never true    Ran Out of Food in the Last Year: Never true  Transportation Needs: No Transportation Needs (03/17/2023)   PRAPARE - Administrator, Civil Service (Medical): No    Lack of Transportation (Non-Medical): No  Physical Activity: Inactive (03/17/2023)   Exercise Vital Sign    Days of Exercise per Week: 0 days    Minutes of Exercise per Session: 0 min  Stress: No Stress Concern Present (03/17/2023)   [employer] of Occupational Health - Occupational Stress Questionnaire    Feeling of Stress : Not at all  Social Connections: Socially Isolated (03/17/2023)   Social Connection and Isolation Panel [NHANES]    Frequency of Communication with Friends and Family: Once a week    Frequency of Social Gatherings with Friends and Family: Never    Attends Religious Services: Never    Database administrator or Organizations: No    Attends Banker Meetings: Never    Marital Status: Divorced  Catering manager Violence: Not At Risk (03/17/2023)   Humiliation, Afraid, Rape, and Kick questionnaire    Fear of Current or Ex-Partner: No    Emotionally Abused: No    Physically Abused: No    Sexually Abused: No     BP (!) 85/60   Pulse (!) 104   Ht 6' (1.829 m)   Wt 178 lb (80.7 kg)   SpO2 96%   BMI 24.14 kg/m   Physical Exam:  Well appearing middle aged man, NAD with dense stutter. HEENT: Unremarkable Neck:  No JVD, no thyromegally Lymphatics:  No adenopathy Back:  No CVA tenderness Lungs:  Clear with no wheezes CV :regular rate rhythm, no murmurs, no rubs, no clicks Abd:  soft, positive bowel sounds, no organomegally, no rebound, no guarding Ext:  2 plus pulses, no edema, no cyanosis, no clubbing Skin:  No rashes no nodules Neuro:  CN II through XII intact, motor grossly intact  EKG - reviewed. NSR with PVC's  with a left bundle pattern, more narrow than his intrinsic sinus QRS, likely originating in the RV septum close to the TV.    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 I would continue mexitil and undergo watchful waiting. No indication for PVC ablation or uptitration of his mexitil at this time. ICM - he denies anginal symptoms. We will follow. Chronic systolic heart failure - his EF is better than before. Could be PVC mediated but not enough symptoms or enough PVC to warrant ablation.   Sharlot Gowda Adylene Dlugosz,MD

## 2023-03-30 ENCOUNTER — Other Ambulatory Visit: Payer: Self-pay

## 2023-04-08 ENCOUNTER — Other Ambulatory Visit: Payer: Self-pay

## 2023-04-15 ENCOUNTER — Other Ambulatory Visit: Payer: Self-pay

## 2023-04-19 ENCOUNTER — Other Ambulatory Visit (HOSPITAL_COMMUNITY): Payer: Self-pay | Admitting: Family Medicine

## 2023-04-20 ENCOUNTER — Other Ambulatory Visit: Payer: Self-pay

## 2023-04-20 MED ORDER — MEXILETINE HCL 150 MG PO CAPS
300.0000 mg | ORAL_CAPSULE | Freq: Two times a day (BID) | ORAL | 8 refills | Status: DC
  Filled 2023-04-20: qty 120, 30d supply, fill #0
  Filled 2023-05-17: qty 120, 30d supply, fill #1
  Filled 2023-06-21: qty 120, 30d supply, fill #2
  Filled 2023-07-19: qty 120, 30d supply, fill #3
  Filled 2023-08-16: qty 120, 30d supply, fill #4
  Filled 2023-09-13: qty 120, 30d supply, fill #5
  Filled 2023-10-11: qty 120, 30d supply, fill #6
  Filled 2023-11-15: qty 120, 30d supply, fill #7
  Filled 2023-12-13: qty 120, 30d supply, fill #8

## 2023-04-27 ENCOUNTER — Other Ambulatory Visit: Payer: Self-pay

## 2023-05-13 ENCOUNTER — Other Ambulatory Visit: Payer: Self-pay

## 2023-05-18 ENCOUNTER — Other Ambulatory Visit: Payer: Self-pay

## 2023-05-25 ENCOUNTER — Other Ambulatory Visit: Payer: Self-pay

## 2023-05-27 ENCOUNTER — Ambulatory Visit: Payer: Medicaid Other | Admitting: Family Medicine

## 2023-05-27 ENCOUNTER — Ambulatory Visit: Payer: Medicaid Other | Admitting: Pulmonary Disease

## 2023-05-27 ENCOUNTER — Encounter: Payer: Self-pay | Admitting: Pulmonary Disease

## 2023-05-27 ENCOUNTER — Other Ambulatory Visit: Payer: Self-pay

## 2023-05-27 VITALS — BP 90/64 | HR 80 | Ht 72.0 in | Wt 180.6 lb

## 2023-05-27 DIAGNOSIS — R918 Other nonspecific abnormal finding of lung field: Secondary | ICD-10-CM | POA: Diagnosis not present

## 2023-05-27 DIAGNOSIS — R053 Chronic cough: Secondary | ICD-10-CM

## 2023-05-27 MED ORDER — BENZONATATE 200 MG PO CAPS
200.0000 mg | ORAL_CAPSULE | Freq: Three times a day (TID) | ORAL | 2 refills | Status: DC | PRN
  Filled 2023-05-27: qty 90, 30d supply, fill #0
  Filled 2023-06-28: qty 90, 30d supply, fill #1
  Filled 2023-07-29: qty 90, 30d supply, fill #2

## 2023-05-27 NOTE — Patient Instructions (Signed)
I will see you back a year from now  We will check your breathing study again a year from now to make sure things remain stable  The cough is related to some scarring at the base of the lung  Monitoring the lung function is the best way to keep an eye on it  I will call in something for the cough for you

## 2023-05-27 NOTE — Progress Notes (Signed)
Jonathan King    [id number]    1965/06/28  Primary Care Physician:Newlin, Odette Horns, MD  Referring Physician: Hoy Register, MD [address] [place],  [place] [address]  Chief complaint:   History of cough  HPI:  Chronic cough  Overall has been stable  No fevers, no chills No worsening of his cough  Activity level maintained  Had a PFT recently showing stable findings No obstruction, no significant bronchodilator response, no restriction, normal diffusing capacity -This is comparable to a PFT 6 months ago -Improved compared to 1 performed 3 years ago -New PFT reviewed with the patient showing improvement in FEV1, FVC,  Denies any pain or discomfort Denies any arthritic problems Denies symptoms of reflux Denies nasal stuffiness or congestion No skin rash  He is not limited with activities of daily living States he can walk for a long distance without being bothered by his breathing  Never smoker  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   atorvastatin (LIPITOR) 80 MG tablet Take 1 tablet (80 mg total) by mouth daily.   carvedilol (COREG) 3.125 MG tablet Take 1 tablet (3.125 mg total) by mouth 2 (two) times daily.   cetirizine (ZYRTEC) 10 MG tablet Take 1 tablet (10 mg total) by mouth daily.   dapagliflozin propanediol (FARXIGA) 5 MG TABS tablet Take 1 tablet (5 mg total) by mouth daily before breakfast.   levothyroxine (SYNTHROID) 50 MCG tablet Take 1 tablet (50 mcg total) by mouth daily before breakfast.   mexiletine (MEXITIL) 150 MG capsule Take 2 capsules (300 mg total) by mouth 2 (two) times daily.   potassium chloride (KLOR-CON) 10 MEQ tablet Take 1 tablet (10 mEq total) by mouth daily. Needs appt for further refills   sacubitril-valsartan  (ENTRESTO) 24-26 MG Take 1 tablet by mouth 2 (two) times daily.   spironolactone (ALDACTONE) 25 MG tablet Take 1 tablet (25 mg total) by mouth daily.   No facility-administered encounter medications on file as of 05/27/2023.    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    Past Surgical History:  Procedure Laterality Date   CARDIAC SURGERY     CHOLECYSTECTOMY  01/31/2012   Procedure: LAPAROSCOPIC CHOLECYSTECTOMY;  Surgeon: Atilano Ina, MD,FACS;  Location: [facility] OR;  Service: General;  Laterality: N/A;   CORONARY ARTERY BYPASS GRAFT N/A 12/21/2018   Procedure: CORONARY ARTERY BYPASS GRAFTING (CABG) x 3, USING LEFT INTERNAL MAMMARY ARTERY (LIMA) AND RIGHT GREATER SAPHENOUS VEIN (SVG) HARVESTED ENDOSCOPICALLY;    LIMA-LAD, SVG-RAMUS, SVG-DIAG;  Surgeon: Kerin Perna, MD;  Location: [facility] OR;  Service: Open Heart Surgery;  Laterality: N/A;   RIGHT/LEFT HEART CATH AND CORONARY ANGIOGRAPHY N/A 12/16/2018   Procedure: RIGHT/LEFT HEART CATH AND CORONARY ANGIOGRAPHY;  Surgeon: Dolores Patty, MD;  Location: [facility] INVASIVE CV LAB;  Service: Cardiovascular;  Laterality: N/A;   TEE WITHOUT CARDIOVERSION N/A 12/21/2018   Procedure: TRANSESOPHAGEAL ECHOCARDIOGRAM (TEE);  Surgeon: Donata Clay, Theron Arista, MD;  Location: [facility] OR;  Service: Open Heart Surgery;  Laterality:  N/A;    Family History  Problem Relation Age of Onset   Parkinson's disease Mother     Social History   Socioeconomic History   Marital status: Single    Spouse name: Not on file   Number of children: Not on file   Years of education: Not on file   Highest education level: Not on file  Occupational History   Not on file  Tobacco Use   Smoking status: Never   Smokeless tobacco: Never  Vaping Use   Vaping status: Never Used  Substance and Sexual Activity   Alcohol use: Yes    Comment: occ   Drug  use: No   Sexual activity: Not on file  Other Topics Concern   Not on file  Social History Narrative   Not on file   Social Drivers of Health   Financial Resource Strain: Low Risk  (03/17/2023)   Overall Financial Resource Strain (CARDIA)    Difficulty of Paying Living Expenses: Not hard at all  Food Insecurity: No Food Insecurity (03/17/2023)   Hunger Vital Sign    Worried About Running Out of Food in the Last Year: Never true    Ran Out of Food in the Last Year: Never true  Transportation Needs: No Transportation Needs (03/17/2023)   PRAPARE - Administrator, Civil Service (Medical): No    Lack of Transportation (Non-Medical): No  Physical Activity: Inactive (03/17/2023)   Exercise Vital Sign    Days of Exercise per Week: 0 days    Minutes of Exercise per Session: 0 min  Stress: No Stress Concern Present (03/17/2023)   [employer] of Occupational Health - Occupational Stress Questionnaire    Feeling of Stress : Not at all  Social Connections: Socially Isolated (03/17/2023)   Social Connection and Isolation Panel [NHANES]    Frequency of Communication with Friends and Family: Once a week    Frequency of Social Gatherings with Friends and Family: Never    Attends Religious Services: Never    Database administrator or Organizations: No    Attends Banker Meetings: Never    Marital Status: Divorced  Catering manager Violence: Not At Risk (03/17/2023)   Humiliation, Afraid, Rape, and Kick questionnaire    Fear of Current or Ex-Partner: No    Emotionally Abused: No    Physically Abused: No    Sexually Abused: No    Review of Systems  Constitutional:  Negative for fatigue.  Respiratory:  Positive for cough.     Vitals:   05/27/23 1546  BP: 90/64  Pulse: 80  SpO2: 94%     Physical Exam Constitutional:      Appearance: Normal appearance.  HENT:     Head: Normocephalic.     Mouth/Throat:     Mouth: Mucous membranes are moist.   Cardiovascular:     Rate and Rhythm: Normal rate and regular rhythm.     Heart sounds: No murmur heard.    No friction rub.  Pulmonary:     Effort: No respiratory distress.     Breath sounds: No stridor. Rales present. No wheezing or rhonchi.  Musculoskeletal:     Cervical back: No rigidity or tenderness.  Neurological:     Mental Status: He is alert.  Psychiatric:        Mood and Affect: Mood normal.     Data Reviewed: High-resolution CT scan of the chest was reviewed with the patient and compared with one performed  about 3 years ago-this was performed in October-some groundglass changes  Pulmonary function test reviewed showing reduced diffusing capacity  Repeat PFT performed about 07/10/2022-with no obstruction, no restriction, normal diffusing capacity  Most recent PFT reviewed with the patient-performed 03/20/2023-improvement  ANA is positive at 1: 80, rheumatoid factor negative, other markers of inflammation/vasculitis negative  Assessment:  Post inflammation scarring -No worsening on most recent CT  Pattern of findings not consistent with IPF  Still has a chronic cough, not any worse Activity level stable  Abnormal CT scan of the chest  ANA positive with ratio 1:80  Plan/Recommendations: Follow-up a year from now  Repeat PFT at that time  May repeat CT if any significant worsening of symptoms  Prescription for benzonatate sent to pharmacy for cough  Encouraged to call with significant concerns   Virl Diamond MD [facility] 05/27/2023, 3:54 PM  CC: Hoy Register, MD

## 2023-06-01 ENCOUNTER — Other Ambulatory Visit: Payer: Self-pay

## 2023-06-10 ENCOUNTER — Other Ambulatory Visit: Payer: Self-pay

## 2023-06-15 ENCOUNTER — Other Ambulatory Visit: Payer: Self-pay

## 2023-06-22 ENCOUNTER — Other Ambulatory Visit: Payer: Self-pay

## 2023-06-29 ENCOUNTER — Other Ambulatory Visit: Payer: Self-pay

## 2023-07-05 ENCOUNTER — Other Ambulatory Visit (HOSPITAL_COMMUNITY): Payer: Self-pay | Admitting: Internal Medicine

## 2023-07-07 ENCOUNTER — Other Ambulatory Visit: Payer: Self-pay

## 2023-07-07 MED ORDER — POTASSIUM CHLORIDE ER 10 MEQ PO TBCR
10.0000 meq | EXTENDED_RELEASE_TABLET | Freq: Every day | ORAL | 3 refills | Status: AC
  Filled 2023-07-07: qty 90, 90d supply, fill #0
  Filled 2023-10-04: qty 90, 90d supply, fill #1
  Filled 2024-01-03: qty 90, 90d supply, fill #2
  Filled 2024-04-10: qty 90, 90d supply, fill #3

## 2023-07-08 ENCOUNTER — Other Ambulatory Visit: Payer: Self-pay

## 2023-07-12 ENCOUNTER — Other Ambulatory Visit (HOSPITAL_COMMUNITY): Payer: Self-pay | Admitting: Internal Medicine

## 2023-07-13 ENCOUNTER — Other Ambulatory Visit: Payer: Self-pay

## 2023-07-13 MED ORDER — SPIRONOLACTONE 25 MG PO TABS
25.0000 mg | ORAL_TABLET | Freq: Every day | ORAL | 3 refills | Status: AC
  Filled 2023-07-13: qty 90, 90d supply, fill #0
  Filled 2023-10-11: qty 90, 90d supply, fill #1
  Filled 2024-01-10: qty 90, 90d supply, fill #2
  Filled 2024-04-10: qty 90, 90d supply, fill #3

## 2023-07-15 ENCOUNTER — Other Ambulatory Visit: Payer: Self-pay

## 2023-07-19 ENCOUNTER — Other Ambulatory Visit (HOSPITAL_COMMUNITY): Payer: Self-pay | Admitting: Internal Medicine

## 2023-07-22 ENCOUNTER — Other Ambulatory Visit: Payer: Self-pay

## 2023-07-22 MED ORDER — CARVEDILOL 3.125 MG PO TABS
3.1250 mg | ORAL_TABLET | Freq: Two times a day (BID) | ORAL | 0 refills | Status: DC
  Filled 2023-07-22: qty 60, 30d supply, fill #0

## 2023-07-30 ENCOUNTER — Other Ambulatory Visit: Payer: Self-pay

## 2023-08-05 ENCOUNTER — Other Ambulatory Visit: Payer: Self-pay

## 2023-08-12 ENCOUNTER — Other Ambulatory Visit: Payer: Self-pay

## 2023-08-16 ENCOUNTER — Other Ambulatory Visit (HOSPITAL_COMMUNITY): Payer: Self-pay | Admitting: Internal Medicine

## 2023-08-19 ENCOUNTER — Other Ambulatory Visit: Payer: Self-pay

## 2023-08-19 MED ORDER — CARVEDILOL 3.125 MG PO TABS
3.1250 mg | ORAL_TABLET | Freq: Two times a day (BID) | ORAL | 0 refills | Status: DC
  Filled 2023-08-19: qty 60, 30d supply, fill #0

## 2023-08-20 NOTE — Progress Notes (Signed)
 error

## 2023-08-23 ENCOUNTER — Other Ambulatory Visit (HOSPITAL_COMMUNITY): Payer: Self-pay | Admitting: Family Medicine

## 2023-08-23 DIAGNOSIS — R7303 Prediabetes: Secondary | ICD-10-CM

## 2023-08-23 DIAGNOSIS — I5022 Chronic systolic (congestive) heart failure: Secondary | ICD-10-CM

## 2023-08-25 ENCOUNTER — Other Ambulatory Visit: Payer: Self-pay

## 2023-08-25 MED ORDER — DAPAGLIFLOZIN PROPANEDIOL 5 MG PO TABS
5.0000 mg | ORAL_TABLET | Freq: Every day | ORAL | 3 refills | Status: DC
Start: 2023-08-25 — End: 2023-09-25
  Filled 2023-08-25 – 2023-08-26 (×2): qty 90, 90d supply, fill #0

## 2023-08-25 MED ORDER — ENTRESTO 24-26 MG PO TABS
1.0000 | ORAL_TABLET | Freq: Two times a day (BID) | ORAL | 3 refills | Status: AC
  Filled 2023-08-25 – 2023-08-26 (×2): qty 180, 90d supply, fill #0
  Filled 2023-11-22: qty 180, 90d supply, fill #1
  Filled 2024-02-14: qty 180, 90d supply, fill #2
  Filled 2024-05-25: qty 180, 90d supply, fill #3

## 2023-08-26 ENCOUNTER — Other Ambulatory Visit: Payer: Self-pay

## 2023-08-26 ENCOUNTER — Telehealth (HOSPITAL_COMMUNITY): Payer: Self-pay | Admitting: Cardiology

## 2023-08-26 ENCOUNTER — Other Ambulatory Visit (HOSPITAL_COMMUNITY): Payer: Self-pay

## 2023-08-26 ENCOUNTER — Telehealth (HOSPITAL_COMMUNITY): Payer: Self-pay

## 2023-08-26 NOTE — Telephone Encounter (Signed)
 TRIAGE WALKIN Pt reports he was told to speak with office regarding PA  Updated pharmacy team with PA Key #[id number]  Samples provided while PA in process  Medication Samples have been provided to the patient.  Drug name: farxiga        Strength: 10mg         Qty: 7  LOT: [id number]  Exp.Date: 01/25/2026  Dosing instructions: take ONE HALF tab daily   The patient has been instructed regarding the correct time, dose, and frequency of taking this medication, including desired effects and most common side effects.   Jonathan King M 11:59 AM 08/26/2023

## 2023-08-26 NOTE — Telephone Encounter (Signed)
 Advanced Heart Failure Patient Advocate Encounter  Prior authorization for Farxiga  has been submitted and approved. Test billing returns $4 for 90 day supply.  Key: [id number] Effective: 08/26/2023 to 08/25/2024  Kennis Peacock, CPhT Rx Patient Advocate Phone: [phone]

## 2023-08-27 ENCOUNTER — Other Ambulatory Visit: Payer: Self-pay

## 2023-08-30 ENCOUNTER — Other Ambulatory Visit: Payer: Self-pay | Admitting: Family Medicine

## 2023-08-30 ENCOUNTER — Other Ambulatory Visit: Payer: Self-pay | Admitting: Pulmonary Disease

## 2023-08-30 DIAGNOSIS — R053 Chronic cough: Secondary | ICD-10-CM

## 2023-08-31 ENCOUNTER — Other Ambulatory Visit: Payer: Self-pay

## 2023-08-31 MED ORDER — CETIRIZINE HCL 10 MG PO TABS
10.0000 mg | ORAL_TABLET | Freq: Every day | ORAL | 2 refills | Status: DC
  Filled 2023-08-31: qty 30, 30d supply, fill #0
  Filled 2023-09-27: qty 30, 30d supply, fill #1
  Filled 2023-10-25: qty 30, 30d supply, fill #2
  Filled 2023-11-22: qty 30, 30d supply, fill #3
  Filled 2023-12-27: qty 30, 30d supply, fill #4
  Filled 2024-01-24: qty 30, 30d supply, fill #5
  Filled 2024-02-21: qty 30, 30d supply, fill #6
  Filled 2024-03-27: qty 30, 30d supply, fill #7
  Filled 2024-05-01: qty 30, 30d supply, fill #8

## 2023-09-01 ENCOUNTER — Other Ambulatory Visit: Payer: Self-pay

## 2023-09-01 MED ORDER — BENZONATATE 200 MG PO CAPS
200.0000 mg | ORAL_CAPSULE | Freq: Three times a day (TID) | ORAL | 2 refills | Status: DC | PRN
  Filled 2023-09-01: qty 90, 30d supply, fill #0
  Filled 2023-09-27: qty 90, 30d supply, fill #1
  Filled 2023-11-01: qty 90, 30d supply, fill #2

## 2023-09-02 ENCOUNTER — Other Ambulatory Visit: Payer: Self-pay

## 2023-09-11 ENCOUNTER — Other Ambulatory Visit: Payer: Self-pay

## 2023-09-13 ENCOUNTER — Other Ambulatory Visit (HOSPITAL_COMMUNITY): Payer: Self-pay | Admitting: Internal Medicine

## 2023-09-14 ENCOUNTER — Encounter: Payer: Self-pay | Admitting: Family Medicine

## 2023-09-14 ENCOUNTER — Ambulatory Visit: Payer: Medicaid Other | Attending: Family Medicine | Admitting: Family Medicine

## 2023-09-14 ENCOUNTER — Other Ambulatory Visit: Payer: Self-pay

## 2023-09-14 VITALS — BP 94/60 | HR 74 | Ht 73.0 in | Wt 174.4 lb

## 2023-09-14 DIAGNOSIS — R053 Chronic cough: Secondary | ICD-10-CM | POA: Diagnosis not present

## 2023-09-14 DIAGNOSIS — E039 Hypothyroidism, unspecified: Secondary | ICD-10-CM

## 2023-09-14 DIAGNOSIS — Z1211 Encounter for screening for malignant neoplasm of colon: Secondary | ICD-10-CM | POA: Diagnosis not present

## 2023-09-14 DIAGNOSIS — I5022 Chronic systolic (congestive) heart failure: Secondary | ICD-10-CM | POA: Diagnosis not present

## 2023-09-14 DIAGNOSIS — I493 Ventricular premature depolarization: Secondary | ICD-10-CM | POA: Diagnosis not present

## 2023-09-14 MED ORDER — CARVEDILOL 3.125 MG PO TABS
3.1250 mg | ORAL_TABLET | Freq: Two times a day (BID) | ORAL | 0 refills | Status: DC
  Filled 2023-09-14: qty 60, 30d supply, fill #0

## 2023-09-14 NOTE — Progress Notes (Signed)
 Subjective:  Patient ID: Jonathan King, male    DOB: [birth year]  Age: 58 y.o. MRN: [id number]  CC: Medical Management of Chronic Issues (No concerns)     Discussed the use of AI scribe software for clinical note transcription with the patient, who gave verbal consent to proceed.  History of Present Illness LEONID MANUS is a 58 year old male with a history of three-vessel CAD status post CABG, CHF EF 40 to 45%, LV global hypokinesis moderately reduced RV function, right ventricular dilatation from echo of 01/2023. Hypothyroidism.  lung scarring, and premature ventricular contractions who presents for follow-up on his medical conditions.  He is following up on his thyroid  condition. He takes levothyroxine , but his last TSH level was elevated. He is due for a repeat thyroid  panel.  Seen by pulmonary early on in the year for chronic cough. He has lung scarring, with a recent CT scan showing no worsening.  No interstitial lung disease per pulmonary note.  He experiences a chronic cough but denies chest pain, shortness of breath, or lightheadedness.  He has premature ventricular contractions and is under cardiology care. He denies ankle swelling or other symptoms of heart failure. He is unsure of his next cardiac clinic appointment.  He has not had a colonoscopy for colon cancer screening and has no gastrointestinal symptoms.    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    Past Surgical History:  Procedure Laterality Date   CARDIAC SURGERY     CHOLECYSTECTOMY  01/31/2012   Procedure: LAPAROSCOPIC CHOLECYSTECTOMY;  Surgeon: Fran Imus, MD,FACS;  Location: [facility] OR;  Service: General;  Laterality: N/A;   CORONARY ARTERY BYPASS GRAFT N/A 12/21/2018   Procedure: CORONARY ARTERY BYPASS GRAFTING (CABG) x 3, USING LEFT INTERNAL MAMMARY ARTERY (LIMA) AND RIGHT GREATER SAPHENOUS VEIN (SVG) HARVESTED ENDOSCOPICALLY;     LIMA-LAD, SVG-RAMUS, SVG-DIAG;  Surgeon: Heriberto London, MD;  Location: [facility] OR;  Service: Open Heart Surgery;  Laterality: N/A;   RIGHT/LEFT HEART CATH AND CORONARY ANGIOGRAPHY N/A 12/16/2018   Procedure: RIGHT/LEFT HEART CATH AND CORONARY ANGIOGRAPHY;  Surgeon: Mardell Shade, MD;  Location: [facility] INVASIVE CV LAB;  Service: Cardiovascular;  Laterality: N/A;   TEE WITHOUT CARDIOVERSION N/A 12/21/2018   Procedure: TRANSESOPHAGEAL ECHOCARDIOGRAM (TEE);  Surgeon: Matt Song, Donata Fryer, MD;  Location: [facility] OR;  Service: Open Heart Surgery;  Laterality: N/A;    Family History  Problem Relation Age of Onset   Parkinson's disease Mother     Social History   Socioeconomic History   Marital status: Single    Spouse name: Not on file   Number of children: Not on file   Years of education: Not on file   Highest education level: Not on file  Occupational History   Not on file  Tobacco Use   Smoking status: Never   Smokeless tobacco: Never  Vaping Use   Vaping status: Never Used  Substance and Sexual Activity   Alcohol use: Yes    Comment: occ   Drug use: No   Sexual activity: Not on file  Other Topics Concern   Not on file  Social History Narrative   Not on file   Social Drivers of Health   Financial Resource Strain: Patient Declined (09/10/2023)   Overall Financial Resource Strain (CARDIA)    Difficulty of Paying Living Expenses: Patient declined  Food Insecurity: Patient  Declined (09/10/2023)   Hunger Vital Sign    Worried About Running Out of Food in the Last Year: Patient declined    Ran Out of Food in the Last Year: Patient declined  Transportation Needs: Patient Declined (09/10/2023)   PRAPARE - Administrator, Civil Service (Medical): Patient declined    Lack of Transportation (Non-Medical): Patient declined  Physical Activity: Unknown (09/10/2023)   Exercise Vital Sign    Days of Exercise per Week: 1 day    Minutes of Exercise per Session: Patient declined   Stress: No Stress Concern Present (09/10/2023)   [employer] of Occupational Health - Occupational Stress Questionnaire    Feeling of Stress : Not at all  Social Connections: Socially Isolated (09/10/2023)   Social Connection and Isolation Panel [NHANES]    Frequency of Communication with Friends and Family: Never    Frequency of Social Gatherings with Friends and Family: Never    Attends Religious Services: Never    Database administrator or Organizations: No    Attends Engineer, structural: Never    Marital Status: Divorced    No Known Allergies  Outpatient Medications Prior to Visit  Medication Sig Dispense Refill   aspirin  EC 81 MG tablet Take 1 tablet (81 mg total) by mouth daily. 30 tablet 11   atorvastatin  (LIPITOR ) 80 MG tablet Take 1 tablet (80 mg total) by mouth daily. 30 tablet 11   benzonatate  (TESSALON ) 200 MG capsule Take 1 capsule (200 mg total) by mouth 3 (three) times daily as needed for cough. 90 capsule 2   carvedilol  (COREG ) 3.125 MG tablet Take 1 tablet (3.125 mg total) by mouth 2 (two) times daily. 60 tablet 0   cetirizine  (ZYRTEC ) 10 MG tablet Take 1 tablet (10 mg total) by mouth daily. 90 tablet 2   dapagliflozin  propanediol (FARXIGA ) 5 MG TABS tablet Take 1 tablet (5 mg total) by mouth daily before breakfast. 90 tablet 3   levothyroxine  (SYNTHROID ) 50 MCG tablet Take 1 tablet (50 mcg total) by mouth daily before breakfast. 30 tablet 6   mexiletine (MEXITIL ) 150 MG capsule Take 2 capsules (300 mg total) by mouth 2 (two) times daily. 120 capsule 8   potassium chloride  (KLOR-CON ) 10 MEQ tablet Take 1 tablet (10 mEq total) by mouth daily. Needs appt for further refills 90 tablet 3   sacubitril -valsartan  (ENTRESTO ) 24-26 MG Take 1 tablet by mouth 2 (two) times daily. 180 tablet 3   spironolactone  (ALDACTONE ) 25 MG tablet Take 1 tablet (25 mg total) by mouth daily. 90 tablet 3   No facility-administered medications prior to visit.     ROS Review of  Systems  Constitutional:  Negative for activity change and appetite change.  HENT:  Negative for sinus pressure and sore throat.   Respiratory:  Negative for chest tightness, shortness of breath and wheezing.   Cardiovascular:  Negative for chest pain and palpitations.  Gastrointestinal:  Negative for abdominal distention, abdominal pain and constipation.  Genitourinary: Negative.   Musculoskeletal: Negative.   Psychiatric/Behavioral:  Negative for behavioral problems and dysphoric mood.     Objective:  BP 94/60   Pulse 74   Ht 6\' 1"  (1.854 m)   Wt 174 lb 6.4 oz (79.1 kg)   SpO2 97%   BMI 23.01 kg/m      09/14/2023    3:09 PM 05/27/2023    3:46 PM 03/25/2023   11:52 AM  BP/Weight  Systolic BP 94 90 85  Diastolic  BP 60 64 60  Wt. (Lbs) 174.4 180.6 178  BMI 23.01 kg/m2 24.49 kg/m2 24.14 kg/m2      Physical Exam Constitutional:      Appearance: He is well-developed.  Cardiovascular:     Rate and Rhythm: Normal rate.     Heart sounds: Normal heart sounds. No murmur heard. Pulmonary:     Effort: Pulmonary effort is normal.     Breath sounds: Normal breath sounds. No wheezing or rales.  Chest:     Chest wall: No tenderness.  Abdominal:     General: Bowel sounds are normal. There is no distension.     Palpations: Abdomen is soft. There is no mass.     Tenderness: There is no abdominal tenderness.  Musculoskeletal:        General: Normal range of motion.     Right lower leg: No edema.     Left lower leg: No edema.  Neurological:     Mental Status: He is alert and oriented to person, place, and time.  Psychiatric:        Mood and Affect: Mood normal.        Latest Ref Rng & Units 02/11/2023    4:04 PM 09/02/2022   12:08 PM 07/08/2022    3:13 PM  CMP  Glucose 70 - 99 mg/dL 161  096  91   BUN 6 - 20 mg/dL 16  15  15    Creatinine 0.61 - 1.24 mg/dL 0.45  4.09  8.11   Sodium 135 - 145 mmol/L 133  133  138   Potassium 3.5 - 5.1 mmol/L 4.0  3.9  4.4   Chloride 98 -  111 mmol/L 100  101  99   CO2 22 - 32 mmol/L 26  26  23    Calcium  8.9 - 10.3 mg/dL 9.0  8.8  9.7   Total Protein 6.0 - 8.5 g/dL   7.8   Total Bilirubin 0.0 - 1.2 mg/dL   0.7   Alkaline Phos 44 - 121 IU/L   124   AST 0 - 40 IU/L   27   ALT 0 - 44 IU/L   33     Lipid Panel     Component Value Date/Time   CHOL 77 09/13/2021 1542   TRIG 103 09/13/2021 1542   HDL 28 (L) 09/13/2021 1542   CHOLHDL 2.8 09/13/2021 1542   VLDL 21 09/13/2021 1542   LDLCALC 28 09/13/2021 1542    CBC    Component Value Date/Time   WBC 7.8 07/23/2022 1038   RBC 4.62 07/23/2022 1038   HGB 13.9 07/23/2022 1038   HCT 42.7 07/23/2022 1038   PLT 230 07/23/2022 1038   MCV 92.4 07/23/2022 1038   MCH 30.1 07/23/2022 1038   MCHC 32.6 07/23/2022 1038   RDW 13.7 07/23/2022 1038   LYMPHSABS 2.3 12/19/2018 0555   MONOABS 0.8 12/19/2018 0555   EOSABS 0.3 12/19/2018 0555   BASOSABS 0.1 12/19/2018 0555    Lab Results  Component Value Date   HGBA1C 5.9 03/17/2023    Lab Results  Component Value Date   TSH 7.860 (H) 07/08/2022      1. Acquired hypothyroidism (Primary) Uncontrolled from last set of labs Send off thyroid  panel and adjust regimen accordingly - T4, free - TSH - T3  2. Chronic HFrEF (heart failure with reduced ejection fraction) (HCC) EF 40 to 45% from echo of 01/2023, LV global hypokinesis, moderately reduced RV systolic function, severely enlarged RV -  Continue GDMT per cardiology - Advised to schedule appointment with cardiology - CMP14+EGFR  3. 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 PVC (premature ventricular contraction) Stable - Continue Mexitil  per cardiology  5. Screening for colon cancer - Ambulatory referral to Gastroenterology   No orders of the defined types were placed in this encounter.   Follow-up: Return in about 6 months (around 03/16/2024) for Chronic medical conditions.        Joaquin Mulberry, MD, FAAFP. [facility] [place], [place] [phone]   09/14/2023, 4:11 PM

## 2023-09-14 NOTE — Patient Instructions (Signed)
 VISIT SUMMARY:  You came in today for a follow-up on your thyroid  condition, lung scarring, and premature ventricular contractions. We discussed your current symptoms and reviewed your recent test results.  YOUR PLAN:  -CONGESTIVE HEART FAILURE WITH REDUCED EF: Congestive heart failure with reduced ejection fraction means your heart is not pumping blood as well as it should. You are not experiencing any worsening symptoms, but you need to schedule a follow-up with your cardiologist to manage your condition and refill your medications.  -HYPOTHYROIDISM: Hypothyroidism means your thyroid  gland is not producing enough thyroid  hormone. We need to assess your thyroid  function to adjust your levothyroxine  dosage. We will order a thyroid  panel and hold off on refilling your medication until we get the results.  -POST-INFLAMMATORY LUNG SCARRING: Post-inflammatory lung scarring means there is scar tissue in your lungs, possibly from a past surgery or inflammation. Your recent CT scan shows that the scarring is stable. We will follow up with a pulmonologist in one year and repeat a pulmonary function test. We will also consult the pulmonologist to understand the cause of the scarring.  -GENERAL HEALTH MAINTENANCE: Routine health maintenance is important for your overall well-being. You are due for a colonoscopy screening since you are over 45. We will also order kidney and liver function tests and schedule a fasting lipid panel to check your cholesterol levels.  INSTRUCTIONS:  Please schedule a follow-up appointment with your cardiologist as soon as possible. We will order a thyroid  panel and hold off on refilling your levothyroxine  until we get the results. Schedule a colonoscopy screening, and we will also order kidney and liver function tests and a fasting lipid panel. Follow up with a pulmonologist in one year for your lung scarring and repeat a pulmonary function test.

## 2023-09-18 ENCOUNTER — Other Ambulatory Visit (HOSPITAL_COMMUNITY): Payer: Self-pay | Admitting: Internal Medicine

## 2023-09-22 ENCOUNTER — Other Ambulatory Visit: Payer: Self-pay

## 2023-09-22 MED ORDER — ATORVASTATIN CALCIUM 80 MG PO TABS
80.0000 mg | ORAL_TABLET | Freq: Every day | ORAL | 11 refills | Status: AC
  Filled 2023-09-22: qty 90, 90d supply, fill #0
  Filled 2023-12-19: qty 90, 90d supply, fill #1
  Filled 2024-03-22: qty 90, 90d supply, fill #2

## 2023-09-23 ENCOUNTER — Other Ambulatory Visit: Payer: Self-pay

## 2023-09-24 ENCOUNTER — Telehealth (HOSPITAL_COMMUNITY): Payer: Self-pay | Admitting: Internal Medicine

## 2023-09-25 ENCOUNTER — Other Ambulatory Visit: Payer: Self-pay

## 2023-09-25 ENCOUNTER — Ambulatory Visit (HOSPITAL_COMMUNITY)
Admission: RE | Admit: 2023-09-25 | Discharge: 2023-09-25 | Disposition: A | Discharge status: Home / Self Care | Source: Ambulatory Visit | Attending: Internal Medicine | Admitting: Internal Medicine

## 2023-09-25 VITALS — BP 108/68 | HR 82 | Wt 174.4 lb

## 2023-09-25 DIAGNOSIS — Z79899 Other long term (current) drug therapy: Secondary | ICD-10-CM | POA: Diagnosis not present

## 2023-09-25 DIAGNOSIS — R053 Chronic cough: Secondary | ICD-10-CM | POA: Diagnosis not present

## 2023-09-25 DIAGNOSIS — Z951 Presence of aortocoronary bypass graft: Secondary | ICD-10-CM | POA: Insufficient documentation

## 2023-09-25 DIAGNOSIS — I2510 Atherosclerotic heart disease of native coronary artery without angina pectoris: Secondary | ICD-10-CM | POA: Diagnosis not present

## 2023-09-25 DIAGNOSIS — Z79890 Hormone replacement therapy (postmenopausal): Secondary | ICD-10-CM | POA: Insufficient documentation

## 2023-09-25 DIAGNOSIS — I255 Ischemic cardiomyopathy: Secondary | ICD-10-CM | POA: Insufficient documentation

## 2023-09-25 DIAGNOSIS — I493 Ventricular premature depolarization: Secondary | ICD-10-CM | POA: Diagnosis not present

## 2023-09-25 DIAGNOSIS — R7303 Prediabetes: Secondary | ICD-10-CM

## 2023-09-25 DIAGNOSIS — I5022 Chronic systolic (congestive) heart failure: Secondary | ICD-10-CM | POA: Diagnosis not present

## 2023-09-25 DIAGNOSIS — R7989 Other specified abnormal findings of blood chemistry: Secondary | ICD-10-CM

## 2023-09-25 DIAGNOSIS — R942 Abnormal results of pulmonary function studies: Secondary | ICD-10-CM | POA: Insufficient documentation

## 2023-09-25 DIAGNOSIS — E039 Hypothyroidism, unspecified: Secondary | ICD-10-CM | POA: Diagnosis not present

## 2023-09-25 DIAGNOSIS — Z7982 Long term (current) use of aspirin: Secondary | ICD-10-CM | POA: Insufficient documentation

## 2023-09-25 LAB — COMPREHENSIVE METABOLIC PANEL WITH GFR
ALT: 34 U/L (ref 0–44)
AST: 25 U/L (ref 15–41)
Albumin: 3.7 g/dL (ref 3.5–5.0)
Alkaline Phosphatase: 99 U/L (ref 38–126)
Anion gap: 9 (ref 5–15)
BUN: 16 mg/dL (ref 6–20)
CO2: 27 mmol/L (ref 22–32)
Calcium: 9.1 mg/dL (ref 8.9–10.3)
Chloride: 98 mmol/L (ref 98–111)
Creatinine, Ser: 0.9 mg/dL (ref 0.61–1.24)
GFR, Estimated: >60 mL/min (ref >60)
Glucose, Bld: 90 mg/dL (ref 70–99)
Potassium: 3.9 mmol/L (ref 3.5–5.1)
Sodium: 134 mmol/L — ABNORMAL LOW (ref 135–145)
Total Bilirubin: 0.6 mg/dL (ref 0.0–1.2)
Total Protein: 7.2 g/dL (ref 6.5–8.1)

## 2023-09-25 LAB — T4, FREE: Free T4: 0.67 ng/dL (ref 0.61–1.12)

## 2023-09-25 LAB — BRAIN NATRIURETIC PEPTIDE: B Natriuretic Peptide: 15.8 pg/mL (ref 0.0–100.0)

## 2023-09-25 MED ORDER — DAPAGLIFLOZIN PROPANEDIOL 10 MG PO TABS
10.0000 mg | ORAL_TABLET | Freq: Every day | ORAL | 11 refills | Status: AC
  Filled 2023-09-25: qty 30, 30d supply, fill #0
  Filled 2023-11-23: qty 30, 30d supply, fill #1
  Filled 2023-12-24: qty 30, 30d supply, fill #2
  Filled 2024-01-27: qty 30, 30d supply, fill #3
  Filled 2024-02-23: qty 30, 30d supply, fill #4
  Filled 2024-03-28: qty 30, 30d supply, fill #5
  Filled 2024-04-25: qty 30, 30d supply, fill #6
  Filled 2024-05-27: qty 30, 30d supply, fill #7

## 2023-09-25 NOTE — Patient Instructions (Signed)
 Great to see you today!!!  Medication Changes:  INCREASE Farxiga  to 10 mg Daily  Lab Work:  Labs done today, your results will be available in MyChart, we will contact you for abnormal readings.  Testing/Procedures:  Your physician has requested that you have a cardiac MRI. Cardiac MRI uses a computer to create images of your heart as its beating, producing both still and moving pictures of your heart and major blood vessels. For further information please visit [email or web address]. Please follow the instruction below. YOU WILL BE CALLED TO SCHEDULE ONCE INSURANCE APPROVED  Special Instructions // Education:  Do the following things EVERYDAY: Weigh yourself in the morning before breakfast. Write it down and keep it in a log. Take your medicines as prescribed Eat low salt foods--Limit salt (sodium) to 2000 mg per day.  Stay as active as you can everyday Limit all fluids for the day to less than 2 liters   Follow-Up in: 1 year (May 2026), **PLEASE CALL OUR OFFICE IN MARCH TO SCHEDULE THIS APPOINTMENT   At the [facility], you and your health needs are our priority. We have a designated team specialized in the treatment of Heart Failure. This Care Team includes your primary Heart Failure Specialized Cardiologist (physician), Advanced Practice Providers (APPs- Physician Assistants and Nurse Practitioners), and Pharmacist who all work together to provide you with the care you need, when you need it.   You may see any of the following providers on your designated Care Team at your next follow up:  Dr. Jules Oar Dr. Peder Bourdon Dr. Alwin Baars Dr. Judyth Nunnery Nieves Bars, NP Ruddy Corral, [place] [facility] [place], [place] Dennise Fitz, NP [place] Lee, NP Luster Salters, PharmD   Please be sure to bring in all your medications bottles to every appointment.   Need to Contact Us :  If you have any questions or concerns before your next appointment  please send us  a message through [facility] or call our office at [phone].    TO LEAVE A MESSAGE FOR THE NURSE SELECT OPTION 2, PLEASE LEAVE A MESSAGE INCLUDING: YOUR NAME DATE OF BIRTH CALL BACK NUMBER REASON FOR CALL**this is important as we prioritize the call backs  YOU WILL RECEIVE A CALL BACK THE SAME DAY AS LONG AS YOU CALL BEFORE 4:00 PM

## 2023-09-25 NOTE — Progress Notes (Signed)
 [facility] Note  PCP: Joaquin Mulberry, MD CT Surgery: Dr Matt Song HF Cardiologist: Dr. Julane Ny  HPI: 58 y.o. King with a history of cholecystectomy in 2013, CAD, S/P CABG, left lung nodule, chronic systolic CHF/ICM, PVCs on amiodarone .   Admitted with increase shortness of breath. Echo 12/11/18 LVEF 15-20% with moderate RV dysfunction .Chest CT 8/17  with suggestion of severe CAD with totally occluded LAD and high-grade ramus lesion. Also LLL nodule suggestive of lung CA (versus infection). He underwent CABG x3 on 12/21/18. Post operative course was uneventful. He was discharged to home with [facility] on 12/30/2018.   Had follow up with CT surgery. Plan for CT scan of chest to reassess possible mass in a couple of months. Amio was stopped at that visit.   CT chest 11/20 resolution of infiltrate/nodule  Zio 6/23 placed for PVCs 1. Sinus rhythm - avg HR of 75 bpm.  2. 12 runs if nonsustained Ventricular Tachycardia occurred, the run with the fastest interval lasting 4 beats with a max rate of 176 bpm, the longest lasting 5 beats with an avg rate of 132 bpm. 3. Isolated PACs were occasional (1.3%, 8344) 4. Isolated PVCs were frequent (8.5%, 55250) with two promary morphologies (5.9%, 2,7%) 5. Ventricular Bigeminy and Trigeminy were present. 6. No King-triggered events were present.   PFTs for cough FEV1 4.13 L (102%) FVC 5.22  L (99%) DLCO 53%  Follow up 9/23. NYHA II, volume ok. HiRes CT ordered with low DLCO on PFTs, referred to [facility]. Mexiletine started with frequent PVCs.  Follow up with Pulmonary 4/24, pattern of CT not consistent with ILD. PFTs (3/24) FEV1 3.96 L (98%), FVC 4.76 L (90%), DLCO 78%.  Follow up 3/24, frequent PVCs on ECG and mexiletine increased to 300 bid. Arranged for cMRI to ensure no infiltrative disease causing PVCs.  Zio 2 week (5/24) showed NSR, 1 run on NSVT and 1 run os SVT, 12.7% PVC burden.  Today he returns for HF follow up. Feels  good. Working at the [employer]. Denies CP or SOB. Compliant with meds. Only complaint is chronic cough  Echo 10/24 EF 40-45% RV moderate Hk Personally reviewed    Cardiac Studies: - Echo 6/23: EF 50-55% - Echo 1/22: EF 45-50%, RV okay, mild to moderate MR. - Echo 2/21: EF 30-35%, RV mildly reduced. - Echo 8/20: EF 15%. RV moderately reduced.   - RHC/LHC 12/16/18 Ao = 88/Jonathan (68) LV = 86/17 RA = 3 RV = 46/5 PA = 45/10 (23) PCW = 17 Fick cardiac output/index = 4.2/2.0 PVR = 1.5 WU Ao sat = 98% PA sat = 67%, 68%   Assessment: 1. 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 Well-compensated filling pressures with moderately reduced CO  ROS: All systems negative except as listed in HPI, PMH and Problem List.  SH:  Social History   Socioeconomic History   Marital status: Single    Spouse name: Not on file   Number of children: Not on file   Years of education: Not on file   Highest education level: Not on file  Occupational History   Not on file  Tobacco Use   Smoking status: Never   Smokeless tobacco: Never  Vaping Use   Vaping status: Never Used  Substance and Sexual Activity   Alcohol use: Yes    Comment: occ   Drug use: No   Sexual activity: Not on file  Other Topics Concern   Not on file  Social History Narrative   Not on file   Social Drivers of Health   Financial Resource Strain: King Declined (09/10/2023)   Overall Financial Resource Strain (CARDIA)    Difficulty of Paying Living Expenses: King declined  Food Insecurity: King Declined (09/10/2023)   Hunger Vital Sign    Worried About Running Out of Food in the Last Year: King declined    Ran Out of Food in the Last Year: King declined  Transportation Needs: King Declined (09/10/2023)   PRAPARE - Administrator, Civil Service (Medical): King declined    Lack of Transportation (Non-Medical): King declined   Physical Activity: Unknown (09/10/2023)   Exercise Vital Sign    Days of Exercise per Week: 1 day    Minutes of Exercise per Session: King declined  Stress: No Stress Concern Present (09/10/2023)   [employer] of Occupational Health - Occupational Stress Questionnaire    Feeling of Stress : Not at all  Social Connections: Socially Isolated (09/10/2023)   Social Connection and Isolation Panel [NHANES]    Frequency of Communication with Friends and Family: Never    Frequency of Social Gatherings with Friends and Family: Never    Attends Religious Services: Never    Database administrator or Organizations: No    Attends Banker Meetings: Never    Marital Status: Divorced  Catering manager Violence: Not At Risk (03/17/2023)   Humiliation, Afraid, Rape, and Kick questionnaire    Fear of Current or Ex-Partner: No    Emotionally Abused: No    Physically Abused: No    Sexually Abused: No   FH:  Family History  Problem Relation Age of Onset   Parkinson's disease Mother     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   Current Outpatient Medications  Medication Sig Dispense Refill   aspirin  EC 81 MG tablet Take 1 tablet (81 mg total) by mouth daily. 30 tablet 11   atorvastatin  (LIPITOR ) 80 MG tablet Take 1 tablet (80 mg total) by mouth daily. 30 tablet 11   benzonatate  (TESSALON ) 200 MG capsule Take 1 capsule (200 mg total) by mouth 3 (three) times daily as needed for cough. 90 capsule 2   carvedilol  (COREG ) 3.125 MG tablet Take 1 tablet (3.125 mg total) by mouth 2 (two) times daily. 60 tablet 0   cetirizine  (ZYRTEC ) 10 MG tablet Take 1 tablet (10 mg total) by mouth daily. 90 tablet 2   dapagliflozin  propanediol (FARXIGA ) 5 MG TABS tablet Take 1 tablet (5 mg total) by mouth daily before breakfast. 90 tablet 3   levothyroxine  (SYNTHROID ) 50 MCG tablet Take 1 tablet (50 mcg total) by mouth daily  before breakfast. 30 tablet 6   mexiletine (MEXITIL ) 150 MG capsule Take 2 capsules (300 mg total) by mouth 2 (two) times daily. 120 capsule 8   potassium chloride  (KLOR-CON ) 10 MEQ tablet Take 1 tablet (10 mEq total) by mouth daily. Needs appt for further refills 90 tablet 3   sacubitril -valsartan  (ENTRESTO ) 24-26 MG Take 1 tablet by mouth 2 (two) times daily. 180 tablet 3   spironolactone  (ALDACTONE ) 25 MG tablet Take 1 tablet (25 mg total) by mouth daily. 90 tablet 3   No current facility-administered medications for this encounter.   BP 108/68   Pulse 82   Wt 79.1 kg (  174 lb 6.4 oz)   SpO2 97%   BMI 23.01 kg/m   Wt Readings from Last 3 Encounters:  09/25/23 79.1 kg (174 lb 6.4 oz)  09/14/23 79.1 kg (174 lb 6.4 oz)  05/27/23 81.9 kg (180 lb 9.6 oz)   PHYSICAL EXAM: General:  Well appearing. No resp difficulty HEENT: normal Neck: supple. no JVD. Carotids 2+ bilat; no bruits. No lymphadenopathy or thryomegaly appreciated. Cor: PMI nondisplaced. Mildly irregular No rubs, gallops or murmurs. Lungs: clear Abdomen: soft, nontender, nondistended. No hepatosplenomegaly. No bruits or masses. Good bowel sounds. Extremities: no cyanosis, clubbing, rash, edema Neuro: alert & orientedx3, cranial nerves grossly intact. moves all 4 extremities w/o difficulty. Affect pleasant + stutter   ECG (personally reviewed): pending  ASSESSMENT & PLAN: 1. CAD  - s/p CABG x 3 on 12/21/18 - Stable no s/s angina - Continue ASA, statin, & b-blocker  2. Chronic Systolic CHF  - ICM - Echo 8/20 EF 20-25%  - Echo 2/21 EF 30-35% - Echo 1/22 EF 45-50%, RV okay, mild to moderate MR - Echo 6/23 EF 50-55% - Echo 10/24 EF 40-45% RV moderate Hk Personally reviewed - Stable NYHA I volume ok  - Continue Entresto  24/26 mg bid BP too low to titrate - Increase Farxiga  5 mg daily -> 10mg  daily - Continue carvedilol  3.125 mg bid.  - Continue spiro 25 mg daily. - Labs today   3. High Burden PVCs:  ~ 20% burden  initially. Suspect likely contributing to cardiomyopathy.  - suppressed on amio - Amio stopped 01/26/19. PVCs restarted 11/20 - Zio 2/21 no AF, mostly in SR w/ 1st degree HB, some ventricular bigeminy and trigeminy present - Off amio as of 1/23 - Zio 6/23: Isolated PVCs were frequent (8.5%, 55250) with two promary morphologies (5.9%, 2,7%) - Mexiletine 200 mg bid started 9/23, increased to 300 bid (3/24) due to increased PVC on ECG. - Zio (5/24): 12% PVC burden - Continue mexiletine 300 mg bid. - cMRI has been ordered but insurance denied. Will resubmit as EF still not back to normal and need to look for scar burden/infiltrative process with MRI - Have deferred sleep study as he denies snoring  4. Atrial Fibrillation, post op:  - NSR on ECG today - No recurrence. Not on AC  5. Decreased DLCO - HiRes CT (9/23) concerning for ILD - ANA + 1:80, Rheumatoid factor negative, other markers of inflammation/vasculitis negative. - Repeat CT (3/24) not suggestive of ILD - Followed by Pulmonary  - Note from 05/27/23 reviewed. Felt to be stable   6. Hypothyroid - On levothryoxine.   Jules Oar MD 2:10 PM

## 2023-09-27 LAB — T3, FREE: T3, Free: 2.6 pg/mL (ref 2.0–4.4)

## 2023-09-28 ENCOUNTER — Other Ambulatory Visit: Payer: Self-pay

## 2023-09-29 ENCOUNTER — Other Ambulatory Visit: Payer: Self-pay

## 2023-10-05 ENCOUNTER — Other Ambulatory Visit: Payer: Self-pay

## 2023-10-06 ENCOUNTER — Other Ambulatory Visit: Payer: Self-pay | Admitting: Family Medicine

## 2023-10-07 ENCOUNTER — Other Ambulatory Visit: Payer: Self-pay

## 2023-10-07 MED ORDER — LEVOTHYROXINE SODIUM 50 MCG PO TABS
50.0000 ug | ORAL_TABLET | Freq: Every day | ORAL | 6 refills | Status: DC
  Filled 2023-10-07: qty 30, 30d supply, fill #0
  Filled 2023-11-09: qty 30, 30d supply, fill #1
  Filled 2023-12-10: qty 30, 30d supply, fill #2
  Filled 2024-01-07: qty 30, 30d supply, fill #3
  Filled 2024-02-07: qty 30, 30d supply, fill #4
  Filled 2024-03-08: qty 30, 30d supply, fill #5
  Filled 2024-04-10: qty 30, 30d supply, fill #6

## 2023-10-11 ENCOUNTER — Other Ambulatory Visit (HOSPITAL_COMMUNITY): Payer: Self-pay | Admitting: Internal Medicine

## 2023-10-12 ENCOUNTER — Other Ambulatory Visit: Payer: Self-pay

## 2023-10-14 ENCOUNTER — Other Ambulatory Visit: Payer: Self-pay

## 2023-10-14 MED ORDER — CARVEDILOL 3.125 MG PO TABS
3.1250 mg | ORAL_TABLET | Freq: Two times a day (BID) | ORAL | 0 refills | Status: DC
  Filled 2023-10-14: qty 60, 30d supply, fill #0

## 2023-10-26 ENCOUNTER — Other Ambulatory Visit: Payer: Self-pay

## 2023-11-02 ENCOUNTER — Other Ambulatory Visit: Payer: Self-pay

## 2023-11-04 ENCOUNTER — Other Ambulatory Visit: Payer: Self-pay

## 2023-11-11 ENCOUNTER — Other Ambulatory Visit: Payer: Self-pay

## 2023-11-15 ENCOUNTER — Other Ambulatory Visit (HOSPITAL_COMMUNITY): Payer: Self-pay | Admitting: Internal Medicine

## 2023-11-17 ENCOUNTER — Other Ambulatory Visit: Payer: Self-pay

## 2023-11-17 MED ORDER — CARVEDILOL 3.125 MG PO TABS
3.1250 mg | ORAL_TABLET | Freq: Two times a day (BID) | ORAL | 0 refills | Status: DC
  Filled 2023-11-17: qty 60, 30d supply, fill #0

## 2023-11-18 ENCOUNTER — Other Ambulatory Visit: Payer: Self-pay

## 2023-11-24 ENCOUNTER — Encounter (HOSPITAL_COMMUNITY): Payer: Self-pay

## 2023-11-27 ENCOUNTER — Ambulatory Visit (HOSPITAL_COMMUNITY)
Admission: RE | Admit: 2023-11-27 | Discharge: 2023-11-27 | Disposition: A | Discharge status: Home / Self Care | Source: Ambulatory Visit | Attending: Internal Medicine | Admitting: Internal Medicine

## 2023-11-27 ENCOUNTER — Encounter (HOSPITAL_COMMUNITY): Payer: Self-pay

## 2023-11-27 DIAGNOSIS — I5022 Chronic systolic (congestive) heart failure: Secondary | ICD-10-CM

## 2023-11-29 ENCOUNTER — Other Ambulatory Visit: Payer: Self-pay | Admitting: Pulmonary Disease

## 2023-12-01 ENCOUNTER — Other Ambulatory Visit: Payer: Self-pay

## 2023-12-01 MED ORDER — BENZONATATE 200 MG PO CAPS
200.0000 mg | ORAL_CAPSULE | Freq: Three times a day (TID) | ORAL | 2 refills | Status: DC | PRN
  Filled 2023-12-01: qty 90, 30d supply, fill #0
  Filled 2024-01-01: qty 90, 30d supply, fill #1
  Filled 2024-01-31: qty 90, 30d supply, fill #2

## 2023-12-13 ENCOUNTER — Other Ambulatory Visit (HOSPITAL_COMMUNITY): Payer: Self-pay | Admitting: Internal Medicine

## 2023-12-14 ENCOUNTER — Other Ambulatory Visit: Payer: Self-pay

## 2023-12-14 MED ORDER — CARVEDILOL 3.125 MG PO TABS
3.1250 mg | ORAL_TABLET | Freq: Two times a day (BID) | ORAL | 0 refills | Status: DC
  Filled 2023-12-14: qty 60, 30d supply, fill #0

## 2024-01-03 ENCOUNTER — Other Ambulatory Visit: Payer: Self-pay

## 2024-01-04 ENCOUNTER — Other Ambulatory Visit: Payer: Self-pay

## 2024-01-10 ENCOUNTER — Other Ambulatory Visit (HOSPITAL_COMMUNITY): Payer: Self-pay | Admitting: Internal Medicine

## 2024-01-11 ENCOUNTER — Other Ambulatory Visit: Payer: Self-pay

## 2024-01-11 MED ORDER — CARVEDILOL 3.125 MG PO TABS
3.1250 mg | ORAL_TABLET | Freq: Two times a day (BID) | ORAL | 0 refills | Status: DC
  Filled 2024-01-11: qty 60, 30d supply, fill #0

## 2024-01-13 ENCOUNTER — Other Ambulatory Visit: Payer: Self-pay

## 2024-01-17 ENCOUNTER — Other Ambulatory Visit (HOSPITAL_COMMUNITY): Payer: Self-pay | Admitting: Family Medicine

## 2024-01-18 ENCOUNTER — Other Ambulatory Visit: Payer: Self-pay

## 2024-01-19 ENCOUNTER — Other Ambulatory Visit: Payer: Self-pay

## 2024-01-19 MED ORDER — MEXILETINE HCL 150 MG PO CAPS
300.0000 mg | ORAL_CAPSULE | Freq: Two times a day (BID) | ORAL | 8 refills | Status: AC
  Filled 2024-01-19: qty 120, 30d supply, fill #0
  Filled 2024-02-14: qty 120, 30d supply, fill #1
  Filled 2024-03-13: qty 120, 30d supply, fill #2
  Filled 2024-04-17: qty 120, 30d supply, fill #3
  Filled 2024-05-15: qty 120, 30d supply, fill #4

## 2024-01-20 ENCOUNTER — Other Ambulatory Visit: Payer: Self-pay

## 2024-01-25 ENCOUNTER — Telehealth (HOSPITAL_COMMUNITY): Payer: Self-pay

## 2024-01-25 ENCOUNTER — Encounter (HOSPITAL_COMMUNITY): Payer: Self-pay

## 2024-01-26 ENCOUNTER — Ambulatory Visit (HOSPITAL_COMMUNITY): Admission: RE | Admit: 2024-01-26 | Source: Ambulatory Visit

## 2024-01-28 ENCOUNTER — Other Ambulatory Visit: Payer: Self-pay

## 2024-02-01 ENCOUNTER — Other Ambulatory Visit: Payer: Self-pay

## 2024-02-10 ENCOUNTER — Other Ambulatory Visit: Payer: Self-pay

## 2024-02-14 ENCOUNTER — Other Ambulatory Visit (HOSPITAL_COMMUNITY): Payer: Self-pay | Admitting: Internal Medicine

## 2024-02-15 ENCOUNTER — Other Ambulatory Visit: Payer: Self-pay

## 2024-02-15 MED ORDER — CARVEDILOL 3.125 MG PO TABS
3.1250 mg | ORAL_TABLET | Freq: Two times a day (BID) | ORAL | 3 refills | Status: AC
  Filled 2024-02-15: qty 60, 30d supply, fill #0
  Filled 2024-03-13: qty 60, 30d supply, fill #1
  Filled 2024-04-17: qty 60, 30d supply, fill #2
  Filled 2024-05-15: qty 60, 30d supply, fill #3

## 2024-02-16 ENCOUNTER — Ambulatory Visit (HOSPITAL_COMMUNITY)
Admission: RE | Admit: 2024-02-16 | Discharge: 2024-02-16 | Disposition: A | Discharge status: Home / Self Care | Source: Ambulatory Visit | Attending: Internal Medicine | Admitting: Internal Medicine

## 2024-02-16 ENCOUNTER — Other Ambulatory Visit (HOSPITAL_COMMUNITY): Payer: Self-pay | Admitting: Internal Medicine

## 2024-02-16 DIAGNOSIS — I5022 Chronic systolic (congestive) heart failure: Secondary | ICD-10-CM | POA: Diagnosis present

## 2024-02-16 MED ORDER — GADOBUTROL 1 MMOL/ML IV SOLN
11.0000 mL | Freq: Once | INTRAVENOUS | Status: AC | PRN
  Administered 2024-02-16: 11 mL via INTRAVENOUS

## 2024-02-17 ENCOUNTER — Other Ambulatory Visit: Payer: Self-pay

## 2024-02-22 ENCOUNTER — Other Ambulatory Visit: Payer: Self-pay

## 2024-02-24 ENCOUNTER — Other Ambulatory Visit: Payer: Self-pay

## 2024-03-06 ENCOUNTER — Other Ambulatory Visit: Payer: Self-pay | Admitting: Pulmonary Disease

## 2024-03-07 ENCOUNTER — Other Ambulatory Visit: Payer: Self-pay

## 2024-03-07 MED ORDER — BENZONATATE 200 MG PO CAPS
200.0000 mg | ORAL_CAPSULE | Freq: Three times a day (TID) | ORAL | 2 refills | Status: DC | PRN
  Filled 2024-03-07: qty 90, 30d supply, fill #0

## 2024-03-09 ENCOUNTER — Other Ambulatory Visit: Payer: Self-pay

## 2024-03-09 ENCOUNTER — Encounter: Payer: Self-pay | Admitting: Pulmonary Disease

## 2024-03-09 ENCOUNTER — Ambulatory Visit (INDEPENDENT_AMBULATORY_CARE_PROVIDER_SITE_OTHER): Admitting: Pulmonary Disease

## 2024-03-09 VITALS — BP 107/73 | HR 92 | Ht 73.0 in | Wt 179.6 lb

## 2024-03-09 DIAGNOSIS — J849 Interstitial pulmonary disease, unspecified: Secondary | ICD-10-CM | POA: Diagnosis not present

## 2024-03-09 DIAGNOSIS — R053 Chronic cough: Secondary | ICD-10-CM

## 2024-03-09 MED ORDER — UMECLIDINIUM-VILANTEROL 62.5-25 MCG/ACT IN AEPB
1.0000 | INHALATION_SPRAY | Freq: Every day | RESPIRATORY_TRACT | 3 refills | Status: DC
  Filled 2024-03-09: qty 60, 30d supply, fill #0

## 2024-03-09 NOTE — Patient Instructions (Addendum)
 Prescription for Anoro to be used 1 puff daily regardless of how you are feeling  We will repeat your CT scan  We will repeat your breathing study  Follow-up in about 2 months  Call us  with significant concerns  Continue using the cough medicine

## 2024-03-09 NOTE — Progress Notes (Signed)
 Jonathan King    [id number]    04/12/1966  Primary Care Physician:Newlin, Corrina, MD  Referring Physician: Delbert Corrina, MD [address] [place] [place],  [place] [address]  Chief complaint:   History of cough In for follow-up  HPI:  He does still have a chronic cough Occasional clear phlegm  No fevers Not feeling acutely ill  Testing so far has been unremarkable with PFT showing no obstruction, no significant bronchodilator response, no restriction, normal diffusing capacity comparable to open prior PFT  CT scan with interstitial changes at the bases  Denies any chest discomfort No symptoms of reflux No skin rash No joint or muscle aches  Feels is able to tolerate most activities he wants to do able to walk a long distance without bothering about his breathing  never smoker  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   atorvastatin  (LIPITOR ) 80 MG tablet Take 1 tablet (80 mg total) by mouth daily.   benzonatate  (TESSALON ) 200 MG capsule Take 1 capsule (200 mg total) by mouth 3 (three) times daily as needed for cough.   carvedilol  (COREG ) 3.125 MG tablet Take 1 tablet (3.125 mg total) by mouth 2 (two) times daily.   cetirizine  (ZYRTEC ) 10 MG tablet Take 1 tablet (10 mg total) by mouth daily.   dapagliflozin  propanediol (FARXIGA ) 10 MG TABS tablet Take 1 tablet (10 mg total) by mouth daily before breakfast.   levothyroxine  (SYNTHROID ) 50 MCG tablet Take 1 tablet (50 mcg total) by mouth daily before breakfast.   mexiletine (MEXITIL ) 150 MG capsule Take 2 capsules (300 mg total) by mouth 2 (two) times daily.   potassium chloride  (KLOR-CON ) 10 MEQ tablet Take 1 tablet (10 mEq total) by mouth daily. Needs appt for further refills   sacubitril -valsartan  (ENTRESTO )  24-26 MG Take 1 tablet by mouth 2 (two) times daily.   spironolactone  (ALDACTONE ) 25 MG tablet Take 1 tablet (25 mg total) by mouth daily.   No facility-administered encounter medications on file as of 03/09/2024.    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    Past Surgical History:  Procedure Laterality Date   CARDIAC SURGERY     CHOLECYSTECTOMY  01/31/2012   Procedure: LAPAROSCOPIC CHOLECYSTECTOMY;  Surgeon: Camellia CHRISTELLA Blush, MD,FACS;  Location: [facility] OR;  Service: General;  Laterality: N/A;   CORONARY ARTERY BYPASS GRAFT N/A 12/21/2018   Procedure: CORONARY ARTERY BYPASS GRAFTING (CABG) x 3, USING LEFT INTERNAL MAMMARY ARTERY (LIMA) AND RIGHT GREATER SAPHENOUS VEIN (SVG) HARVESTED ENDOSCOPICALLY;    LIMA-LAD, SVG-RAMUS, SVG-DIAG;  Surgeon: Fleeta Hanford Coy, MD;  Location: [facility] OR;  Service: Open Heart Surgery;  Laterality: N/A;   RIGHT/LEFT HEART CATH AND CORONARY ANGIOGRAPHY N/A 12/16/2018   Procedure: RIGHT/LEFT HEART CATH AND CORONARY ANGIOGRAPHY;  Surgeon: Cherrie Toribio SAUNDERS, MD;  Location: [facility] INVASIVE CV LAB;  Service: Cardiovascular;  Laterality: N/A;   TEE WITHOUT CARDIOVERSION N/A 12/21/2018   Procedure: TRANSESOPHAGEAL ECHOCARDIOGRAM (TEE);  Surgeon: Fleeta Hanford, Coy, MD;  Location: [facility] OR;  Service: Open Heart  Surgery;  Laterality: N/A;    Family History  Problem Relation Age of Onset   Parkinson's disease Mother     Social History   Socioeconomic History   Marital status: Single    Spouse name: Not on file   Number of children: Not on file   Years of education: Not on file   Highest education level: Not on file  Occupational History   Not on file  Tobacco Use   Smoking status: Never   Smokeless tobacco: Never  Vaping Use   Vaping status: Never Used  Substance and Sexual Activity   Alcohol use: Yes    Comment: occ   Drug use: No    Sexual activity: Not on file  Other Topics Concern   Not on file  Social History Narrative   Not on file   Social Drivers of Health   Financial Resource Strain: Patient Declined (09/10/2023)   Overall Financial Resource Strain (CARDIA)    Difficulty of Paying Living Expenses: Patient declined  Food Insecurity: Patient Declined (09/10/2023)   Hunger Vital Sign    Worried About Running Out of Food in the Last Year: Patient declined    Ran Out of Food in the Last Year: Patient declined  Transportation Needs: Patient Declined (09/10/2023)   PRAPARE - Administrator, Civil Service (Medical): Patient declined    Lack of Transportation (Non-Medical): Patient declined  Physical Activity: Unknown (09/10/2023)   Exercise Vital Sign    Days of Exercise per Week: 1 day    Minutes of Exercise per Session: Patient declined  Stress: No Stress Concern Present (09/10/2023)   [employer] of Occupational Health - Occupational Stress Questionnaire    Feeling of Stress : Not at all  Social Connections: Socially Isolated (09/10/2023)   Social Connection and Isolation Panel    Frequency of Communication with Friends and Family: Never    Frequency of Social Gatherings with Friends and Family: Never    Attends Religious Services: Never    Database Administrator or Organizations: No    Attends Banker Meetings: Never    Marital Status: Divorced  Catering Manager Violence: Not At Risk (03/17/2023)   Humiliation, Afraid, Rape, and Kick questionnaire    Fear of Current or Ex-Partner: No    Emotionally Abused: No    Physically Abused: No    Sexually Abused: No    Review of Systems  Constitutional:  Negative for fatigue.  Respiratory:  Positive for cough.   Psychiatric/Behavioral:  Negative for sleep disturbance.     Vitals:   03/09/24 1540  BP: 107/73  Pulse: 92  SpO2: 94%     Physical Exam Constitutional:      Appearance: Normal appearance.  HENT:     Head:  Normocephalic.     Mouth/Throat:     Mouth: Mucous membranes are moist.  Eyes:     General: No scleral icterus. Cardiovascular:     Rate and Rhythm: Normal rate and regular rhythm.     Heart sounds: No murmur heard.    No friction rub.  Pulmonary:     Effort: No respiratory distress.     Breath sounds: No stridor. Rales present. No wheezing or rhonchi.  Musculoskeletal:     Cervical back: No rigidity or tenderness.  Neurological:     Mental Status: He is alert.  Psychiatric:        Mood and Affect: Mood normal.     Data Reviewed:  CT  scan 08/21/2022 reviewed-some ground glass changes at the bases, compared with previous, reviewed with the patient, bases of the CT does show possible bronchiectasis  PFT was reviewed from 03/20/2023 showing a normal PFT with no obstruction, no restriction normal diffusing capacity  ANA test in the past with 1: 80, rheumatoid factor negative other markers of inflammation negative  Assessment:  Postinflammatory scaring on CT  CT findings not consistent with IPF but patient still does have a chronic persistent cough  Some bronchiectatic changes at the bases of the lungs  Plan/Recommendations: Will repeat CT and PFT  Continue benzonatate  as needed  Empiric trial with a bronchodilator with antimuscarinic-Anoro  Follow-up in about 2 months  Encouraged to call with significant concerns     Jennet Epley MD [facility] 03/09/2024, 3:42 PM  CC: Delbert Clam, MD

## 2024-03-16 ENCOUNTER — Ambulatory Visit
Admission: RE | Admit: 2024-03-16 | Discharge: 2024-03-16 | Disposition: A | Discharge status: Home / Self Care | Source: Ambulatory Visit | Attending: Pulmonary Disease | Admitting: Pulmonary Disease

## 2024-03-16 DIAGNOSIS — J849 Interstitial pulmonary disease, unspecified: Secondary | ICD-10-CM

## 2024-03-16 DIAGNOSIS — J479 Bronchiectasis, uncomplicated: Secondary | ICD-10-CM | POA: Diagnosis not present

## 2024-03-16 DIAGNOSIS — R053 Chronic cough: Secondary | ICD-10-CM

## 2024-03-19 ENCOUNTER — Inpatient Hospital Stay (HOSPITAL_COMMUNITY)
Admission: EM | Admit: 2024-03-19 | Discharge: 2024-03-21 | DRG: 394 | Disposition: A | Discharge status: Home / Self Care | Source: Ambulatory Visit | Attending: Internal Medicine | Admitting: Internal Medicine

## 2024-03-19 ENCOUNTER — Telehealth: Payer: Self-pay | Admitting: Pulmonary Disease

## 2024-03-19 ENCOUNTER — Encounter (HOSPITAL_COMMUNITY): Payer: Self-pay | Admitting: Emergency Medicine

## 2024-03-19 ENCOUNTER — Emergency Department (HOSPITAL_COMMUNITY)

## 2024-03-19 ENCOUNTER — Other Ambulatory Visit: Payer: Self-pay

## 2024-03-19 DIAGNOSIS — Z951 Presence of aortocoronary bypass graft: Secondary | ICD-10-CM | POA: Diagnosis not present

## 2024-03-19 DIAGNOSIS — I10 Essential (primary) hypertension: Secondary | ICD-10-CM | POA: Diagnosis not present

## 2024-03-19 DIAGNOSIS — K631 Perforation of intestine (nontraumatic): Secondary | ICD-10-CM | POA: Diagnosis not present

## 2024-03-19 DIAGNOSIS — E876 Hypokalemia: Secondary | ICD-10-CM | POA: Diagnosis not present

## 2024-03-19 DIAGNOSIS — I2510 Atherosclerotic heart disease of native coronary artery without angina pectoris: Secondary | ICD-10-CM | POA: Diagnosis not present

## 2024-03-19 DIAGNOSIS — K567 Ileus, unspecified: Secondary | ICD-10-CM | POA: Diagnosis present

## 2024-03-19 DIAGNOSIS — K668 Other specified disorders of peritoneum: Principal | ICD-10-CM | POA: Diagnosis present

## 2024-03-19 DIAGNOSIS — Z820 Family history of epilepsy and other diseases of the nervous system: Secondary | ICD-10-CM | POA: Diagnosis not present

## 2024-03-19 DIAGNOSIS — E039 Hypothyroidism, unspecified: Secondary | ICD-10-CM | POA: Diagnosis not present

## 2024-03-19 DIAGNOSIS — E785 Hyperlipidemia, unspecified: Secondary | ICD-10-CM | POA: Diagnosis not present

## 2024-03-19 DIAGNOSIS — I5022 Chronic systolic (congestive) heart failure: Secondary | ICD-10-CM | POA: Diagnosis not present

## 2024-03-19 DIAGNOSIS — K5700 Diverticulitis of small intestine with perforation and abscess without bleeding: Secondary | ICD-10-CM | POA: Diagnosis present

## 2024-03-19 DIAGNOSIS — Z7951 Long term (current) use of inhaled steroids: Secondary | ICD-10-CM | POA: Diagnosis not present

## 2024-03-19 DIAGNOSIS — Z79899 Other long term (current) drug therapy: Secondary | ICD-10-CM | POA: Diagnosis not present

## 2024-03-19 DIAGNOSIS — Z7982 Long term (current) use of aspirin: Secondary | ICD-10-CM | POA: Diagnosis not present

## 2024-03-19 DIAGNOSIS — I493 Ventricular premature depolarization: Secondary | ICD-10-CM | POA: Diagnosis present

## 2024-03-19 DIAGNOSIS — J849 Interstitial pulmonary disease, unspecified: Secondary | ICD-10-CM | POA: Diagnosis present

## 2024-03-19 DIAGNOSIS — Z79890 Hormone replacement therapy (postmenopausal): Secondary | ICD-10-CM

## 2024-03-19 LAB — CBC WITH DIFFERENTIAL/PLATELET
Abs Immature Granulocytes: 0.06 K/uL (ref 0.00–0.07)
Basophils Absolute: 0 K/uL (ref 0.0–0.1)
Basophils Relative: 1 %
Eosinophils Absolute: 0.7 K/uL — ABNORMAL HIGH (ref 0.0–0.5)
Eosinophils Relative: 12 %
HCT: 40.8 % (ref 39.0–52.0)
Hemoglobin: 13.4 g/dL (ref 13.0–17.0)
Immature Granulocytes: 1 %
Lymphocytes Relative: 22 %
Lymphs Abs: 1.4 K/uL (ref 0.7–4.0)
MCH: 30.4 pg (ref 26.0–34.0)
MCHC: 32.8 g/dL (ref 30.0–36.0)
MCV: 92.5 fL (ref 80.0–100.0)
Monocytes Absolute: 0.6 K/uL (ref 0.1–1.0)
Monocytes Relative: 10 %
Neutro Abs: 3.5 K/uL (ref 1.7–7.7)
Neutrophils Relative %: 54 %
Platelets: 272 K/uL (ref 150–400)
RBC: 4.41 MIL/uL (ref 4.22–5.81)
RDW: 13.2 % (ref 11.5–15.5)
WBC: 6.2 K/uL (ref 4.0–10.5)
nRBC: 0 % (ref 0.0–0.2)

## 2024-03-19 LAB — COMPREHENSIVE METABOLIC PANEL WITH GFR
ALT: 26 U/L (ref 0–44)
AST: 23 U/L (ref 15–41)
Albumin: 2.6 g/dL — ABNORMAL LOW (ref 3.5–5.0)
Alkaline Phosphatase: 89 U/L (ref 38–126)
Anion gap: 6 (ref 5–15)
BUN: 12 mg/dL (ref 6–20)
CO2: 21 mmol/L — ABNORMAL LOW (ref 22–32)
Calcium: 6.1 mg/dL — CL (ref 8.9–10.3)
Chloride: 112 mmol/L — ABNORMAL HIGH (ref 98–111)
Creatinine, Ser: 0.62 mg/dL (ref 0.61–1.24)
GFR, Estimated: >60 mL/min (ref >60)
Glucose, Bld: 102 mg/dL — ABNORMAL HIGH (ref 70–99)
Potassium: 2.5 mmol/L — CL (ref 3.5–5.1)
Sodium: 139 mmol/L (ref 135–145)
Total Bilirubin: 0.2 mg/dL (ref 0.0–1.2)
Total Protein: 4.8 g/dL — ABNORMAL LOW (ref 6.5–8.1)

## 2024-03-19 LAB — URINALYSIS, ROUTINE W REFLEX MICROSCOPIC
Bacteria, UA: NONE SEEN
Bilirubin Urine: NEGATIVE
Glucose, UA: >=500 mg/dL — AB
Hgb urine dipstick: NEGATIVE
Ketones, ur: NEGATIVE mg/dL
Leukocytes,Ua: NEGATIVE
Nitrite: NEGATIVE
Protein, ur: NEGATIVE mg/dL
Specific Gravity, Urine: 1.022 (ref 1.005–1.030)
pH: 5 (ref 5.0–8.0)

## 2024-03-19 LAB — MAGNESIUM: Magnesium: 2.2 mg/dL (ref 1.7–2.4)

## 2024-03-19 LAB — LIPASE, BLOOD: Lipase: 25 U/L (ref 11–51)

## 2024-03-19 MED ORDER — PANTOPRAZOLE SODIUM 40 MG IV SOLR
40.0000 mg | Freq: Every day | INTRAVENOUS | Status: DC
  Administered 2024-03-19 – 2024-03-21 (×3): 40 mg via INTRAVENOUS
  Filled 2024-03-19 (×3): qty 10

## 2024-03-19 MED ORDER — OXYCODONE HCL 5 MG PO TABS: 5.0000 mg | ORAL_TABLET | ORAL | Status: DC | PRN

## 2024-03-19 MED ORDER — MEXILETINE HCL 150 MG PO CAPS
300.0000 mg | ORAL_CAPSULE | Freq: Two times a day (BID) | ORAL | Status: DC
  Administered 2024-03-19 – 2024-03-21 (×4): 300 mg via ORAL
  Filled 2024-03-19 (×4): qty 2

## 2024-03-19 MED ORDER — SACUBITRIL-VALSARTAN 24-26 MG PO TABS
1.0000 | ORAL_TABLET | Freq: Two times a day (BID) | ORAL | Status: DC
  Administered 2024-03-19 – 2024-03-20 (×3): 1 via ORAL
  Filled 2024-03-19 (×4): qty 1

## 2024-03-19 MED ORDER — HYDROMORPHONE HCL 1 MG/ML IJ SOLN: 0.5000 mg | INTRAMUSCULAR | Status: DC | PRN

## 2024-03-19 MED ORDER — POTASSIUM CHLORIDE 10 MEQ/100ML IV SOLN
10.0000 meq | INTRAVENOUS | Status: AC
  Administered 2024-03-19 (×3): 10 meq via INTRAVENOUS
  Filled 2024-03-19 (×3): qty 100

## 2024-03-19 MED ORDER — IOHEXOL 300 MG/ML  SOLN
100.0000 mL | Freq: Once | INTRAMUSCULAR | Status: AC | PRN
  Administered 2024-03-19: 100 mL via INTRAVENOUS

## 2024-03-19 MED ORDER — PROCHLORPERAZINE EDISYLATE 10 MG/2ML IJ SOLN: 5.0000 mg | Freq: Four times a day (QID) | INTRAMUSCULAR | Status: DC | PRN

## 2024-03-19 MED ORDER — POTASSIUM CHLORIDE IN NACL 20-0.9 MEQ/L-% IV SOLN
INTRAVENOUS | Status: DC
  Filled 2024-03-19 (×2): qty 1000

## 2024-03-19 MED ORDER — CALCIUM GLUCONATE-NACL 1-0.675 GM/50ML-% IV SOLN
1.0000 g | Freq: Once | INTRAVENOUS | Status: AC
  Administered 2024-03-19: 1000 mg via INTRAVENOUS
  Filled 2024-03-19: qty 50

## 2024-03-19 MED ORDER — POTASSIUM CHLORIDE CRYS ER 20 MEQ PO TBCR
40.0000 meq | EXTENDED_RELEASE_TABLET | Freq: Once | ORAL | Status: AC
  Administered 2024-03-19: 40 meq via ORAL
  Filled 2024-03-19: qty 2

## 2024-03-19 MED ORDER — SODIUM CHLORIDE 0.9% FLUSH
3.0000 mL | Freq: Two times a day (BID) | INTRAVENOUS | Status: DC
  Administered 2024-03-19 – 2024-03-21 (×4): 3 mL via INTRAVENOUS

## 2024-03-19 MED ORDER — ATORVASTATIN CALCIUM 40 MG PO TABS
80.0000 mg | ORAL_TABLET | Freq: Every day | ORAL | Status: DC
  Administered 2024-03-20 – 2024-03-21 (×2): 80 mg via ORAL
  Filled 2024-03-19 (×2): qty 2

## 2024-03-19 MED ORDER — ACETAMINOPHEN 650 MG RE SUPP: 650.0000 mg | Freq: Four times a day (QID) | RECTAL | Status: DC | PRN

## 2024-03-19 MED ORDER — ACETAMINOPHEN 325 MG PO TABS: 650.0000 mg | ORAL_TABLET | Freq: Four times a day (QID) | ORAL | Status: DC | PRN

## 2024-03-19 MED ORDER — UMECLIDINIUM-VILANTEROL 62.5-25 MCG/ACT IN AEPB
1.0000 | INHALATION_SPRAY | Freq: Every day | RESPIRATORY_TRACT | Status: DC
  Administered 2024-03-20 – 2024-03-21 (×2): 1 via RESPIRATORY_TRACT
  Filled 2024-03-19: qty 14

## 2024-03-19 MED ORDER — LEVOTHYROXINE SODIUM 50 MCG PO TABS
50.0000 ug | ORAL_TABLET | Freq: Every day | ORAL | Status: DC
  Administered 2024-03-20 – 2024-03-21 (×2): 50 ug via ORAL
  Filled 2024-03-19 (×2): qty 1

## 2024-03-19 NOTE — ED Provider Notes (Signed)
  EMERGENCY DEPARTMENT AT [facility] Provider Note   CSN: [id number] Arrival date & time: 03/19/24  1611     Patient presents with: abnormal CT   Jonathan King is a 58 y.o. male.  {Add pertinent medical, surgical, social history, OB history to HPI:32947} HPI   Patient has a history of back pain CHF and interstitial lung disease.  Patient states he had an outpatient visit on the 12th.  He had an outpatient CT scan of his chest ordered.  Patient was called by his doctor today because the CT scan was read and it showed pneumoperitoneum.  CT scan was performed on the 19th.  Patient denies having any trouble with abdominal pain.  He is not having any vomiting.  No fevers.  Prior to Admission medications   Medication Sig Start Date End Date Taking? Authorizing Provider  aspirin  EC 81 MG tablet Take 1 tablet (81 mg total) by mouth daily. 07/22/21   Bensimhon, Toribio SAUNDERS, MD  atorvastatin  (LIPITOR ) 80 MG tablet Take 1 tablet (80 mg total) by mouth daily. 09/22/23   Bensimhon, Toribio SAUNDERS, MD  benzonatate  (TESSALON ) 200 MG capsule Take 1 capsule (200 mg total) by mouth 3 (three) times daily as needed for cough. 03/07/24   Neda Jennet LABOR, MD  carvedilol  (COREG ) 3.125 MG tablet Take 1 tablet (3.125 mg total) by mouth 2 (two) times daily. 02/15/24   Bensimhon, Toribio SAUNDERS, MD  cetirizine  (ZYRTEC ) 10 MG tablet Take 1 tablet (10 mg total) by mouth daily. 08/31/23   Newlin, Enobong, MD  dapagliflozin  propanediol (FARXIGA ) 10 MG TABS tablet Take 1 tablet (10 mg total) by mouth daily before breakfast. 09/25/23   Bensimhon, Toribio SAUNDERS, MD  levothyroxine  (SYNTHROID ) 50 MCG tablet Take 1 tablet (50 mcg total) by mouth daily before breakfast. 10/07/23   Newlin, Enobong, MD  mexiletine (MEXITIL ) 150 MG capsule Take 2 capsules (300 mg total) by mouth 2 (two) times daily. 01/19/24   Milford, Harlene HERO, FNP  potassium chloride  (KLOR-CON ) 10 MEQ tablet Take 1 tablet (10 mEq total) by mouth daily. Needs  appt for further refills 07/07/23   Bensimhon, Toribio SAUNDERS, MD  sacubitril -valsartan  (ENTRESTO ) 24-26 MG Take 1 tablet by mouth 2 (two) times daily. 08/25/23   Milford, Harlene HERO, FNP  spironolactone  (ALDACTONE ) 25 MG tablet Take 1 tablet (25 mg total) by mouth daily. 07/13/23   Bensimhon, Toribio SAUNDERS, MD  umeclidinium-vilanterol (ANORO ELLIPTA ) 62.5-25 MCG/ACT AEPB Inhale 1 puff into the lungs daily. 03/09/24   Neda Jennet LABOR, MD    Allergies: Patient has no known allergies.    Review of Systems  Updated Vital Signs BP 118/86 (BP Location: Right Arm)   Pulse 94   Temp 98.5 F (36.9 C) (Oral)   Resp 20   Ht 1.854 m (6' 1)   Wt 81.6 kg   SpO2 97%   BMI 23.75 kg/m   Physical Exam Vitals and nursing note reviewed.  Constitutional:      General: He is not in acute distress.    Appearance: He is well-developed.  HENT:     Head: Normocephalic and atraumatic.     Right Ear: External ear normal.     Left Ear: External ear normal.  Eyes:     General: No scleral icterus.       Right eye: No discharge.        Left eye: No discharge.     Conjunctiva/sclera: Conjunctivae normal.  Neck:     Trachea:  No tracheal deviation.  Cardiovascular:     Rate and Rhythm: Normal rate and regular rhythm.  Pulmonary:     Effort: Pulmonary effort is normal. No respiratory distress.     Breath sounds: Normal breath sounds. No stridor. No wheezing or rales.  Abdominal:     General: Bowel sounds are normal. There is no distension.     Palpations: Abdomen is soft.     Tenderness: There is no abdominal tenderness. There is no guarding or rebound.  Musculoskeletal:        General: No tenderness or deformity.     Cervical back: Neck supple.  Skin:    General: Skin is warm and dry.     Findings: No rash.  Neurological:     General: No focal deficit present.     Mental Status: He is alert.     Cranial Nerves: No cranial nerve deficit, dysarthria or facial asymmetry.     Sensory: No sensory deficit.      Motor: No abnormal muscle tone or seizure activity.     Coordination: Coordination normal.  Psychiatric:        Mood and Affect: Mood normal.     (all labs ordered are listed, but only abnormal results are displayed) Labs Reviewed  COMPREHENSIVE METABOLIC PANEL WITH GFR  LIPASE, BLOOD  CBC WITH DIFFERENTIAL/PLATELET  URINALYSIS, ROUTINE W REFLEX MICROSCOPIC    EKG: None  Radiology: No results found.  {Document cardiac monitor, telemetry assessment procedure when appropriate:32947} Procedures   Medications Ordered in the ED - No data to display    {Click here for ABCD2, HEART and other calculators REFRESH Note before signing:1}                              Medical Decision Making Amount and/or Complexity of Data Reviewed Labs: ordered. Radiology: ordered.   ***  {Document critical care time when appropriate  Document review of labs and clinical decision tools ie CHADS2VASC2, etc  Document your independent review of radiology images and any outside records  Document your discussion with family members, caretakers and with consultants  Document social determinants of health affecting pt's care  Document your decision making why or why not admission, treatments were needed:32947:::1}   Final diagnoses:  None    ED Discharge Orders     None

## 2024-03-19 NOTE — H&P (Signed)
 History and Physical    Jonathan King FMW:[id number] DOB: [birth year] DOA: 03/19/2024  PCP: Delbert Clam, MD   Patient coming from: Home   Chief Complaint: Abnormal outpatient CT   HPI: Jonathan King is a 58 y.o. male with medical history significant for CAD status post CABG, chronic HFmrEF, hypothyroidism, frequent PVCs, and ILD who presents to the emergency department for evaluation of abnormal outpatient CT.  Patient is followed by pulmonology for ILD and had outpatient chest CT a couple days ago.  Patient reports that someone from his pulmonology clinic reached him by phone today and told him to go to the emergency department for evaluation of free intra-abdominal air on the CT.  Patient denies any current or recent abdominal pain, distention, loss of appetite, nausea, vomiting, diarrhea, fever, or chills.  He has states that he has been in his usual state recently.  ED Course: Upon arrival to the ED, patient is found to be afebrile and saturating mid 90s on room air with normal RR, normal HR, and stable BP.  Labs are most notable for potassium 2.5, albumin  2.6, calcium  6.1, and normal WBC.  Surgery (Dr. Tanda) was consulted by the ED physician and the patient was treated with IV potassium.  Review of Systems:  All other systems reviewed and apart from HPI, are negative.  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    Past Surgical History:  Procedure Laterality Date   CARDIAC SURGERY     CHOLECYSTECTOMY  01/31/2012   Procedure: LAPAROSCOPIC CHOLECYSTECTOMY;  Surgeon: Camellia CHRISTELLA Tanda, MD,FACS;  Location: [facility] OR;  Service: General;  Laterality: N/A;   CORONARY ARTERY BYPASS GRAFT N/A 12/21/2018   Procedure: CORONARY ARTERY BYPASS GRAFTING (CABG) x 3, USING LEFT INTERNAL MAMMARY ARTERY (LIMA) AND RIGHT GREATER SAPHENOUS VEIN (SVG) HARVESTED ENDOSCOPICALLY;    LIMA-LAD, SVG-RAMUS, SVG-DIAG;  Surgeon: Fleeta Hanford Coy, MD;  Location[facility] OR;  Service: Open Heart Surgery;  Laterality: N/A;   RIGHT/LEFT HEART CATH AND CORONARY ANGIOGRAPHY N/A 12/16/2018   Procedure: RIGHT/LEFT HEART CATH AND CORONARY ANGIOGRAPHY;  Surgeon: Cherrie Toribio SAUNDERS, MD;  Location: [facility] INVASIVE CV LAB;  Service: Cardiovascular;  Laterality: N/A;   TEE WITHOUT CARDIOVERSION N/A 12/21/2018   Procedure: TRANSESOPHAGEAL ECHOCARDIOGRAM (TEE);  Surgeon: Fleeta Hanford, Coy, MD;  Location: [facility] OR;  Service: Open Heart Surgery;  Laterality: N/A;    Social History:   reports that he has never smoked. He has never used smokeless tobacco. He reports current alcohol use. He reports that he does not use drugs.  No Known Allergies  Family History  Problem Relation Age of Onset   Parkinson's disease Mother      Prior to Admission medications   Medication Sig Start Date End Date Taking? Authorizing Provider  aspirin  EC 81 MG tablet Take 1 tablet (81 mg total) by mouth daily. 07/22/21   Bensimhon, Toribio SAUNDERS, MD  atorvastatin  (LIPITOR ) 80 MG tablet Take 1 tablet (80 mg total) by mouth daily. 09/22/23   Bensimhon, Toribio SAUNDERS, MD  benzonatate  (TESSALON ) 200 MG capsule Take 1 capsule (200 mg total) by mouth 3 (three) times daily as needed for cough. 03/07/24   Neda Jennet LABOR, MD  carvedilol  (COREG ) 3.125 MG tablet Take 1 tablet (3.125 mg total) by mouth 2 (two) times daily. 02/15/24   Bensimhon, Toribio SAUNDERS, MD  cetirizine  (ZYRTEC ) 10 MG tablet Take 1 tablet (  10 mg total) by mouth daily. 08/31/23   Newlin, Enobong, MD  dapagliflozin  propanediol (FARXIGA ) 10 MG TABS tablet Take 1 tablet (10 mg total) by mouth daily before breakfast. 09/25/23   Bensimhon, Toribio SAUNDERS, MD  levothyroxine  (SYNTHROID ) 50 MCG tablet Take 1 tablet (50 mcg total) by mouth daily before breakfast. 10/07/23   Newlin, Enobong, MD  mexiletine (MEXITIL ) 150 MG capsule Take 2 capsules (300 mg total) by mouth 2 (two) times daily. 01/19/24   Milford, Harlene HERO, FNP  potassium chloride   (KLOR-CON ) 10 MEQ tablet Take 1 tablet (10 mEq total) by mouth daily. Needs appt for further refills 07/07/23   Bensimhon, Toribio SAUNDERS, MD  sacubitril -valsartan  (ENTRESTO ) 24-26 MG Take 1 tablet by mouth 2 (two) times daily. 08/25/23   Milford, Harlene HERO, FNP  spironolactone  (ALDACTONE ) 25 MG tablet Take 1 tablet (25 mg total) by mouth daily. 07/13/23   Bensimhon, Toribio SAUNDERS, MD  umeclidinium-vilanterol (ANORO ELLIPTA ) 62.5-25 MCG/ACT AEPB Inhale 1 puff into the lungs daily. 03/09/24   Neda Jennet LABOR, MD    Physical Exam: Vitals:   03/19/24 1623 03/19/24 1730 03/19/24 1900 03/19/24 2033  BP:  102/78 115/89   Pulse:  78 79   Resp:  18 14   Temp:    97.6 F (36.4 C)  TempSrc:    Axillary  SpO2:  93% 96%   Weight: 81.6 kg     Height: 6' 1 (1.854 m)        Constitutional: NAD, no pallor or diaphoresis   Eyes: PERTLA, lids and conjunctivae normal ENMT: Mucous membranes are moist. Posterior pharynx clear of any exudate or lesions.   Neck: supple, no masses  Respiratory: no wheezing, no crackles. No accessory muscle use.  Cardiovascular: S1 & S2 heard, regular rate and rhythm. No extremity edema.  Abdomen: Soft, non-tender. Bowel sounds active.  Musculoskeletal: no clubbing / cyanosis. No joint deformity upper and lower extremities.   Skin: no significant rashes, lesions, ulcers. Warm, dry, well-perfused. Neurologic: CN 2-12 grossly intact. Moving all extremities. Alert and oriented. Stuttering speech.  Psychiatric: Calm. Cooperative.    Labs and Imaging on Admission: I have personally reviewed following labs and imaging studies  CBC: Recent Labs  Lab 03/19/24 1719  WBC 6.2  NEUTROABS 3.5  HGB 13.4  HCT 40.8  MCV 92.5  PLT 272   Basic Metabolic Panel: Recent Labs  Lab 03/19/24 1719  NA 139  K 2.5*  CL 112*  CO2 21*  GLUCOSE 102*  BUN 12  CREATININE 0.62  CALCIUM  6.1*   GFR: Estimated Creatinine Clearance: 113.7 mL/min (by C-G formula based on SCr of 0.62  mg/dL). Liver Function Tests: Recent Labs  Lab 03/19/24 1719  AST 23  ALT 26  ALKPHOS 89  BILITOT 0.2  PROT 4.8*  ALBUMIN  2.6*   Recent Labs  Lab 03/19/24 1719  LIPASE 25   No results for input(s): AMMONIA in the last 168 hours. Coagulation Profile: No results for input(s): INR, PROTIME in the last 168 hours. Cardiac Enzymes: No results for input(s): CKTOTAL, CKMB, CKMBINDEX, TROPONINI in the last 168 hours. BNP (last 3 results) No results for input(s): PROBNP in the last 8760 hours. HbA1C: No results for input(s): HGBA1C in the last 72 hours. CBG: No results for input(s): GLUCAP in the last 168 hours. Lipid Profile: No results for input(s): CHOL, HDL, LDLCALC, TRIG, CHOLHDL, LDLDIRECT in the last 72 hours. Thyroid  Function Tests: No results for input(s): TSH, T4TOTAL, FREET4, T3FREE, THYROIDAB in the last  72 hours. Anemia Panel: No results for input(s): VITAMINB12, FOLATE, FERRITIN, TIBC, IRON, RETICCTPCT in the last 72 hours. Urine analysis:    Component Value Date/Time   COLORURINE YELLOW 03/19/2024 1719   APPEARANCEUR CLEAR 03/19/2024 1719   LABSPEC 1.022 03/19/2024 1719   PHURINE 5.0 03/19/2024 1719   GLUCOSEU >=500 (A) 03/19/2024 1719   HGBUR NEGATIVE 03/19/2024 1719   BILIRUBINUR NEGATIVE 03/19/2024 1719   KETONESUR NEGATIVE 03/19/2024 1719   PROTEINUR NEGATIVE 03/19/2024 1719   UROBILINOGEN 2.0 (H) 01/30/2012 1606   NITRITE NEGATIVE 03/19/2024 1719   LEUKOCYTESUR NEGATIVE 03/19/2024 1719   Sepsis Labs: @LABRCNTIP (procalcitonin:4,lacticidven:4) )No results found for this or any previous visit (from the past 240 hours).   Radiological Exams on Admission: CT ABDOMEN PELVIS W CONTRAST Result Date: 03/19/2024 EXAM: CT ABDOMEN AND PELVIS WITH CONTRAST 03/19/2024 07:19:48 PM TECHNIQUE: CT of the abdomen and pelvis was performed with the administration of 100 mL of iohexol  (OMNIPAQUE ) 300 MG/ML solution.  Multiplanar reformatted images are provided for review. Automated exposure control, iterative reconstruction, and/or weight-based adjustment of the mA/kV was utilized to reduce the radiation dose to as low as reasonably achievable. COMPARISON: 03/16/2024 CLINICAL HISTORY: Pneumoperitoneum noted on outpatient chest CT. FINDINGS: LOWER CHEST: Lung bases show bronchiectasis similar to that seen on recent chest CT. No focal confluent infiltrate is seen. LIVER: The liver is within normal limits. GALLBLADDER AND BILE DUCTS: Gallbladder has been surgically removed. No biliary ductal dilatation. SPLEEN: The spleen is within normal limits. PANCREAS: The pancreas is within normal limits. A large duodenal diverticulum is noted adjacent to the head of the pancreas. ADRENAL GLANDS: The adrenal glands are within normal limits. KIDNEYS, URETERS AND BLADDER: The kidneys demonstrate a normal enhancement pattern bilaterally. No renal calculi or obstructive changes are seen. The bladder is partially distended. GI AND BOWEL: The stomach is well distended with air and fluid. Fecal material is noted throughout the colon. Extensive colonic diverticular change is noted without evidence of diverticulitis. The appendix is not discretely visualized and may have been surgically removed. No inflammatory changes to suggest appendicitis are seen. The small bowel demonstrates multiple sizable small bowel diverticula. Additionally, there are scattered loops of mildly dilated small bowel identified. PERITONEUM AND RETROPERITONEUM: Free intraperitoneal air is noted throughout the abdomen similar to that seen on prior CT of the chest. The free air has likely resulted from a rupture of a small bowel diverticulum. The definitive area of perforation is difficult to evaluate on this exam. No significant free pelvic fluid is noted. VASCULATURE: The abdominal aorta is within normal limits. LYMPH NODES: No lymphadenopathy is seen. REPRODUCTIVE ORGANS: The  prostate is within normal limits. BONES AND SOFT TISSUES: The bony structures show degenerative change of the lumbar spine. No acute osseous abnormality. No focal soft tissue abnormality. IMPRESSION: 1. Free intraperitoneal air, likely from perforated small bowel diverticulum, with the exact perforation site indeterminate on this exam. 2. Fluid-filled, mildly dilated loops of small bowel in the mid abdomen, likely reactive ileus Results were called to Dr. Randol at the time of exam interpretation. Electronically signed by: Oneil Devonshire MD 03/19/2024 07:38 PM EST RP Workstation: [id number]    EKG: Independently reviewed. Sinus rhythm, non-specific IVCD.   Assessment/Plan   1. Bowel perforation  - Noted incidentally on outpatient CT and confirmed on CT in ED; patient has been asymptomatic - Continue bowel rest and supportive care, follow-up on surgry recommendations    2. CAD  - No anginal symptoms  - Hold ASA pending surgical  consultation    3. Chronic HFmrEF  - EF was 40-45% on echo from October 2024  - Appears compensated  - Monitor weight and I/Os, hold diuretic while NPO   4. Hypokalemia; hypocalcemia  - Replacing, will repeat chem panel in am    5. PVCs  - Managed with Mexitil     6. Hypothyroidism  - Synthroid     DVT prophylaxis: SCDs  Code Status: Full  Level of Care: Level of care: Telemetry Family Communication: Significant other at bedside  Disposition Plan:  Patient is from: Home  Anticipated d/c is to: TBD Anticipated d/c date is: TBD Patient currently: Pending surgical consultation and recommendations   Consults called: Surgery  Admission status: Inpatient     Evalene GORMAN Sprinkles, MD [facility]  03/19/2024, 8:37 PM

## 2024-03-19 NOTE — Consult Note (Signed)
 CC: I was told to come to the emergency room  Requesting provider: Dr Randol  HPI: Jonathan King is an 58 y.o. male with PMHx of CHF/ICM, PVCs, remote history of CABG, chronic cough, with questionable chronic lung disease had an outpatient CT of his chest which showed pneumoperitoneum and he was called and directed to come to the emergency room.  He denies any abdominal pain.  He denies any anorexia.  He denies any nausea or vomiting.  Has been having bowel movements which are unchanged for him.  He reports a good appetite.  He states that he has been in his usual state of health.  Significant other at bedside states that he has had probably some increased fatigue.  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    Past Surgical History:  Procedure Laterality Date   CARDIAC SURGERY     CHOLECYSTECTOMY  01/31/2012   Procedure: LAPAROSCOPIC CHOLECYSTECTOMY;  Surgeon: Camellia CHRISTELLA Blush, MD,FACS;  Location: [facility] OR;  Service: General;  Laterality: N/A;   CORONARY ARTERY BYPASS GRAFT N/A 12/21/2018   Procedure: CORONARY ARTERY BYPASS GRAFTING (CABG) x 3, USING LEFT INTERNAL MAMMARY ARTERY (LIMA) AND RIGHT GREATER SAPHENOUS VEIN (SVG) HARVESTED ENDOSCOPICALLY;    LIMA-LAD, SVG-RAMUS, SVG-DIAG;  Surgeon: Fleeta Hanford Coy, MD;  Location: [facility] OR;  Service: Open Heart Surgery;  Laterality: N/A;   RIGHT/LEFT HEART CATH AND CORONARY ANGIOGRAPHY N/A 12/16/2018   Procedure: RIGHT/LEFT HEART CATH AND CORONARY ANGIOGRAPHY;  Surgeon: Cherrie Toribio SAUNDERS, MD;  Location: [facility] INVASIVE CV LAB;  Service: Cardiovascular;  Laterality: N/A;   TEE WITHOUT CARDIOVERSION N/A 12/21/2018   Procedure: TRANSESOPHAGEAL ECHOCARDIOGRAM (TEE);  Surgeon: Fleeta Hanford, Coy, MD;  Location: [facility] OR;  Service: Open Heart Surgery;  Laterality: N/A;    Family History  Problem Relation Age of Onset   Parkinson's disease  Mother     Social:  reports that he has never smoked. He has never used smokeless tobacco. He reports current alcohol use. He reports that he does not use drugs.  Allergies: No Known Allergies  Medications: I have reviewed the patient's current medications.   ROS - all of the below systems have been reviewed with the patient and positives are indicated with bold text General: chills, fever or night sweats Eyes: blurry vision or double vision ENT: epistaxis or sore throat Allergy/Immunology: itchy/watery eyes or nasal congestion Hematologic/Lymphatic: bleeding problems, blood clots or swollen lymph nodes Endocrine: temperature intolerance or unexpected weight changes Breast: new or changing breast lumps or nipple discharge Resp: cough, shortness of breath, or wheezing CV: chest pain or dyspnea on exertion GI: as per HPI GU: dysuria, trouble voiding, or hematuria MSK: joint pain or joint stiffness Neuro: TIA or stroke symptoms Derm: pruritus and skin lesion changes Psych: anxiety and depression  PE Blood pressure 115/89, pulse 79, temperature 97.6 F (36.4 C), temperature source Axillary, resp. rate 14, height 6' 1 (1.854 m), weight 81.6 kg, SpO2 96%. Constitutional: NAD; conversant; no deformities; not ill-appearing Eyes: Moist conjunctiva; no lid lag; anicteric; PERRL Neck: Trachea midline; no thyromegaly Lungs: Normal respiratory effort; no tactile fremitus CV: RRR; no palpable thrills; no pitting edema GI: Abd soft, nontender, nondistended, old trocar scars no peritonitis; no palpable hepatosplenomegaly MSK: ; no clubbing/cyanosis Psychiatric: Appropriate affect; alert and oriented x3 Lymphatic: No palpable cervical or axillary lymphadenopathy Skin: No rash lesions  or jaundice  Results for orders placed or performed during the hospital encounter of 03/19/24 (from the past 48 hours)  Comprehensive metabolic panel     Status: Abnormal   Collection Time: 03/19/24  5:19 PM   Result Value Ref Range   Sodium 139 135 - 145 mmol/L   Potassium 2.5 (LL) 3.5 - 5.1 mmol/L    Comment: Critical Value, Read Back and verified with FOLEY, I. RN AT 1833 03/19/2024 BY JEREMY C    Chloride 112 (H) 98 - 111 mmol/L   CO2 21 (L) 22 - 32 mmol/L   Glucose, Bld 102 (H) 70 - 99 mg/dL    Comment: Glucose reference range applies only to samples taken after fasting for at least 8 hours.   BUN 12 6 - 20 mg/dL   Creatinine, Ser 9.37 0.61 - 1.24 mg/dL   Calcium  6.1 (LL) 8.9 - 10.3 mg/dL    Comment: Critical Value, Read Back and verified with FOLEY, I. RN AT 1833 03/19/2024 BY JEREMY C    Total Protein 4.8 (L) 6.5 - 8.1 g/dL   Albumin  2.6 (L) 3.5 - 5.0 g/dL   AST 23 15 - 41 U/L   ALT 26 0 - 44 U/L   Alkaline Phosphatase 89 38 - 126 U/L   Total Bilirubin 0.2 0.0 - 1.2 mg/dL   GFR, Estimated >39 >39 mL/min    Comment: (NOTE) Calculated using the CKD-EPI Creatinine Equation (2021)    Anion gap 6 5 - 15    Comment: Performed at [facility], [address], [place], [place] [address]  Lipase, blood     Status: None   Collection Time: 03/19/24  5:19 PM  Result Value Ref Range   Lipase 25 11 - 51 U/L    Comment: Performed at [facility], [address], [place], [place] [address]  CBC with Diff     Status: Abnormal   Collection Time: 03/19/24  5:19 PM  Result Value Ref Range   WBC 6.2 4.0 - 10.5 K/uL   RBC 4.41 4.22 - 5.81 MIL/uL   Hemoglobin 13.4 13.0 - 17.0 g/dL   HCT 59.1 60.9 - 47.9 %   MCV 92.5 80.0 - 100.0 fL   MCH 30.4 26.0 - 34.0 pg   MCHC 32.8 30.0 - 36.0 g/dL   RDW 86.7 88.4 - 84.4 %   Platelets 272 150 - 400 K/uL   nRBC 0.0 0.0 - 0.2 %   Neutrophils Relative % 54 %   Neutro Abs 3.5 1.7 - 7.7 K/uL   Lymphocytes Relative 22 %   Lymphs Abs 1.4 0.7 - 4.0 K/uL   Monocytes Relative 10 %   Monocytes Absolute 0.6 0.1 - 1.0 K/uL   Eosinophils Relative 12 %   Eosinophils Absolute 0.7 (H) 0.0 - 0.5 K/uL   Basophils Relative 1 %    Basophils Absolute 0.0 0.0 - 0.1 K/uL   Immature Granulocytes 1 %   Abs Immature Granulocytes 0.06 0.00 - 0.07 K/uL    Comment: Performed at [facility], [address] [place], [place] [address]  Urinalysis, Routine w reflex microscopic -Urine, Clean Catch     Status: Abnormal   Collection Time: 03/19/24  5:19 PM  Result Value Ref Range   Color, Urine YELLOW YELLOW   APPearance CLEAR CLEAR   Specific Gravity, Urine 1.022 1.005 - 1.030   pH 5.0 5.0 - 8.0   Glucose, UA >=500 (A) NEGATIVE mg/dL  Hgb urine dipstick NEGATIVE NEGATIVE   Bilirubin Urine NEGATIVE NEGATIVE   Ketones, ur NEGATIVE NEGATIVE mg/dL   Protein, ur NEGATIVE NEGATIVE mg/dL   Nitrite NEGATIVE NEGATIVE   Leukocytes,Ua NEGATIVE NEGATIVE   RBC / HPF 0-5 0 - 5 RBC/hpf   WBC, UA 0-5 0 - 5 WBC/hpf   Bacteria, UA NONE SEEN NONE SEEN   Squamous Epithelial / HPF 0-5 0 - 5 /HPF    Comment: Performed at [facility], [address], [place], [place] [address]    CT ABDOMEN PELVIS W CONTRAST Result Date: 03/19/2024 EXAM: CT ABDOMEN AND PELVIS WITH CONTRAST 03/19/2024 07:19:48 PM TECHNIQUE: CT of the abdomen and pelvis was performed with the administration of 100 mL of iohexol  (OMNIPAQUE ) 300 MG/ML solution. Multiplanar reformatted images are provided for review. Automated exposure control, iterative reconstruction, and/or weight-based adjustment of the mA/kV was utilized to reduce the radiation dose to as low as reasonably achievable. COMPARISON: 03/16/2024 CLINICAL HISTORY: Pneumoperitoneum noted on outpatient chest CT. FINDINGS: LOWER CHEST: Lung bases show bronchiectasis similar to that seen on recent chest CT. No focal confluent infiltrate is seen. LIVER: The liver is within normal limits. GALLBLADDER AND BILE DUCTS: Gallbladder has been surgically removed. No biliary ductal dilatation. SPLEEN: The spleen is within normal limits. PANCREAS: The pancreas is within normal limits. A large  duodenal diverticulum is noted adjacent to the head of the pancreas. ADRENAL GLANDS: The adrenal glands are within normal limits. KIDNEYS, URETERS AND BLADDER: The kidneys demonstrate a normal enhancement pattern bilaterally. No renal calculi or obstructive changes are seen. The bladder is partially distended. GI AND BOWEL: The stomach is well distended with air and fluid. Fecal material is noted throughout the colon. Extensive colonic diverticular change is noted without evidence of diverticulitis. The appendix is not discretely visualized and may have been surgically removed. No inflammatory changes to suggest appendicitis are seen. The small bowel demonstrates multiple sizable small bowel diverticula. Additionally, there are scattered loops of mildly dilated small bowel identified. PERITONEUM AND RETROPERITONEUM: Free intraperitoneal air is noted throughout the abdomen similar to that seen on prior CT of the chest. The free air has likely resulted from a rupture of a small bowel diverticulum. The definitive area of perforation is difficult to evaluate on this exam. No significant free pelvic fluid is noted. VASCULATURE: The abdominal aorta is within normal limits. LYMPH NODES: No lymphadenopathy is seen. REPRODUCTIVE ORGANS: The prostate is within normal limits. BONES AND SOFT TISSUES: The bony structures show degenerative change of the lumbar spine. No acute osseous abnormality. No focal soft tissue abnormality. IMPRESSION: 1. Free intraperitoneal air, likely from perforated small bowel diverticulum, with the exact perforation site indeterminate on this exam. 2. Fluid-filled, mildly dilated loops of small bowel in the mid abdomen, likely reactive ileus Results were called to Dr. Randol at the time of exam interpretation. Electronically signed by: Oneil Devonshire MD 03/19/2024 07:38 PM EST RP Workstation: [id number]    Imaging: Personally reviewed  A/P: Vaughn JULIANNA Beverage is an 58 y.o. male  Pneumoperitoneum  likely from perforated diverticuli CAD Chronic heart failure Hypokalemia Hypocalcemia PVCs Hypothyroidism   Admit to medicine Replace electrolytes Bowel rest tonight Repeat labs in the morning Repeat CT with oral contrast Okay for chemical VTE prophylaxis from my standpoint  He is not clinically ill.  He has got no peritonitis.  There is no free fluid on CT.  No leukocytosis.  No tachycardia.  Does not need urgent laparotomy or laparoscopy.  Probably perforated  a  small bowel diverticuli that is sealed  Would recommend repeating CT with oral contrast to rule out extravasation of oral contrast/leak  Data reviewed: Personally reviewed images of CT chest, personally reviewed images of CT abdomen pelvis November 22, cardiology clinic note May 30, pulmonary office note November 12, cardiac MRI, labs in the emergency room, ED notes  Moderate mdm  Camellia HERO. Tanda, MD, FACS General, Bariatric, & Minimally Invasive Surgery [facility]

## 2024-03-19 NOTE — ED Triage Notes (Signed)
 Pt was advised to come to ER for evaluation doe to abnormal abdominal CT scan that showed a collection of air in his belly. Denies abd pain and GI symptoms. Vitals WNL.

## 2024-03-19 NOTE — Telephone Encounter (Signed)
 I received a critical result from radiology regarding Jonathan King's CT high resolution. He has free air in his abdomen and possible pneumatosis. I called the patient's phone number two times as well as his emergency contacts. There was no answer. I left voice messages for him to go to the emergency room. I was able to get in touch later on with his roommate and he put me on the line with [name]. I told him to come in to the ED for evaluation. Suprisingly, he has no abdominal pain but he will come in to get evaluated. I will call ED at [facility] to let them know.

## 2024-03-20 ENCOUNTER — Inpatient Hospital Stay (HOSPITAL_COMMUNITY)

## 2024-03-20 DIAGNOSIS — K668 Other specified disorders of peritoneum: Secondary | ICD-10-CM | POA: Diagnosis not present

## 2024-03-20 LAB — BASIC METABOLIC PANEL WITH GFR
Anion gap: 7 (ref 5–15)
BUN: 11 mg/dL (ref 6–20)
CO2: 24 mmol/L (ref 22–32)
Calcium: 8.6 mg/dL — ABNORMAL LOW (ref 8.9–10.3)
Chloride: 103 mmol/L (ref 98–111)
Creatinine, Ser: 0.82 mg/dL (ref 0.61–1.24)
GFR, Estimated: >60 mL/min (ref >60)
Glucose, Bld: 90 mg/dL (ref 70–99)
Potassium: 4.4 mmol/L (ref 3.5–5.1)
Sodium: 134 mmol/L — ABNORMAL LOW (ref 135–145)

## 2024-03-20 LAB — CBC
HCT: 40.5 % (ref 39.0–52.0)
Hemoglobin: 13.1 g/dL (ref 13.0–17.0)
MCH: 30 pg (ref 26.0–34.0)
MCHC: 32.3 g/dL (ref 30.0–36.0)
MCV: 92.9 fL (ref 80.0–100.0)
Platelets: 250 K/uL (ref 150–400)
RBC: 4.36 MIL/uL (ref 4.22–5.81)
RDW: 13.2 % (ref 11.5–15.5)
WBC: 7.5 K/uL (ref 4.0–10.5)
nRBC: 0 % (ref 0.0–0.2)

## 2024-03-20 LAB — HIV ANTIBODY (ROUTINE TESTING W REFLEX): HIV Screen 4th Generation wRfx: NONREACTIVE

## 2024-03-20 LAB — MAGNESIUM: Magnesium: 2.2 mg/dL (ref 1.7–2.4)

## 2024-03-20 MED ORDER — CARVEDILOL 3.125 MG PO TABS
3.1250 mg | ORAL_TABLET | Freq: Two times a day (BID) | ORAL | Status: DC
  Administered 2024-03-20: 3.125 mg via ORAL
  Filled 2024-03-20 (×2): qty 1

## 2024-03-20 MED ORDER — IOHEXOL 9 MG/ML PO SOLN
500.0000 mL | ORAL | Status: AC
  Administered 2024-03-19 – 2024-03-20 (×3): 500 mL via ORAL

## 2024-03-20 MED ORDER — SPIRONOLACTONE 25 MG PO TABS
25.0000 mg | ORAL_TABLET | Freq: Every day | ORAL | Status: DC
  Administered 2024-03-20 – 2024-03-21 (×2): 25 mg via ORAL
  Filled 2024-03-20 (×2): qty 1

## 2024-03-20 NOTE — Progress Notes (Signed)
 PROGRESS NOTE    Jonathan King  FMW:[id number] DOB: 1966-01-28 DOA: 03/19/2024 PCP: Delbert Clam, MD    Brief Narrative:   Jonathan King is a 58 y.o. male with past medical history significant for CAD s/p CABG, chronic HFmrEF, hypothyroidism, frequent PVCs, ILD who presented to [facility] ED on 03/19/2024 for evaluation of abnormal outpatient CT chest.  Patient followed by pulmonology for ILD, underwent outpatient CT chest a few days prior with findings concerning of free air on imaging.  Patient reports he was directed by the pulmonology clinic to proceed to the ED for further evaluation of the free intra-abdominal air noted on the CT.  Patient denies any recent or current abdominal pain, no distention, no loss of appetite, no nausea/vomiting/diarrhea, no fever/chills.  In the ED, temperature 98.5 F, HR 94, RR 20, BP 118/86, SpO2 97% on room air.  WBC 6.2, hemoglobin 13.4, platelet count 272.  Sodium 139, potassium 2.5, chloride 112, CO2 21, glucose 102, BUN 12, creatinine 0.62.  Calcium  6.1.  Lipase 25, AST 23, ALT 26, total bili 0.2.  Magnesium  2.2.  Urinalysis unrevealing.  CT abdomen/pelvis with contrast with findings of free intraperitoneal air likely from perforated small bowel diverticulum with exact perforation site indeterminate, fluid-filled mildly dilated loops of small bowel in the midabdomen likely reactive ileus.  General surgery was consulted.  TRH consulted for admission for further evaluation and management of pneumoperitoneum of unclear etiology.    Assessment & Plan:   Pneumoperitoneum, unclear etiology Patient presenting to the ED as directed by his pulmonologist office after abnormal findings noted on CT chest.  CT chest outpatient concerning for findings of intra-abdominal free air.  Patient has been asymptomatic, denies any abdominal pain, no abdominal distention without nausea/vomiting or diarrhea.  Repeat CT abdomen/pelvis on admission with findings of free intraperitoneal  air likely from perforated small bowel diverticulum of unclear site.  General surgery was consulted and patient underwent repeat CT abdomen/pelvis with oral contrast with repeat findings of free intraperitoneal air, unchanged from prior exam source not identified. -- General Surgery following, appreciate assistance -- Remains asymptomatic -- Started on full liquid diet; plan to further advance to soft diet in a.m.  Hypokalemia Hypocalcemia Repleted -- Repeat electrolytes in a.m.  CAD s/p CABG HFmrEF, chronic History of frequent PVCs TTE 02/24/2023 with LVEF 40-45%, LV with mildly decreased function with global hypokinesis, diastolic parameters indeterminate, RV systolic function moderately reduced, RV size severely enlarged, IVC normal in size, no air stenosis. -- Entresto  24-26 mg p.o. twice daily -- Carvedilol  3.125 g p.o. twice daily -- Spironolactone  20 mg p.o. daily -- Mexiletine 3 mg p.o. twice daily  HLD -- Atorvastatin  80 mg p.o. daily  Hypothyroidism --Levothyroxine  50 mcg p.o. daily  Interstitial lung disease -- Anoro Ellipta  1 puff daily  DVT prophylaxis: SCDs Start: 03/19/24 2027    Code Status: Full Code Family Communication: Updated family present at bedside this morning  Disposition Plan:  Level of care: Telemetry Status is: Inpatient Remains inpatient appropriate because: Needs further advancement of diet, general surgery signed off before stable for discharge home, hopefully plan discharge tomorrow    Consultants:  General surgery  Procedures:  None  Antimicrobials:  None   Subjective: Patient seen examined bedside, lying in bed.  Family present.  Seen by general surgery, started on full liquid diet.  Slightly upset that he would not be discharging home today.  General surgery plans to further advance diet tomorrow if remains asymptomatic with recommendation of outpatient  endoscopy.  No other questions or concerns at this time.  Denies headache, no  dizziness, no chest pain, no palpitations, no shortness of breath, no abdominal pain, no fever/chills/night sweats, no nausea/vomiting/diarrhea, no focal weakness, no fatigue, no paresthesias.  No acute events overnight per nurse staff.  Objective: Vitals:   03/20/24 0123 03/20/24 0500 03/20/24 0508 03/20/24 1029  BP: 109/84  99/66 90/64  Pulse: 69  66 72  Resp: 20   18  Temp: 98.3 F (36.8 C)  98.2 F (36.8 C) 98.2 F (36.8 C)  TempSrc: Oral  Oral Oral  SpO2:      Weight:  81.5 kg    Height:        Intake/Output Summary (Last 24 hours) at 03/20/2024 1206 Last data filed at 03/20/2024 0900 Gross per 24 hour  Intake 1539.33 ml  Output 700 ml  Net 839.33 ml   Filed Weights   03/19/24 1623 03/19/24 2140 03/20/24 0500  Weight: 81.6 kg 81.4 kg 81.5 kg    Examination:  Physical Exam: GEN: NAD, alert and oriented x 3, wd/wn HEENT: NCAT, PERRL, EOMI, sclera clear, MMM PULM: CTAB w/o wheezes/crackles, normal respiratory effort, on room air CV: RRR w/o M/G/R GI: abd soft, NTND, NABS, no R/G/M MSK: no peripheral edema, muscle strength globally intact 5/5 bilateral upper/lower extremities NEURO: CN II-XII intact, no focal deficits, sensation to light touch intact PSYCH: normal mood/affect Integumentary: dry/intact, no rashes or wounds    Data Reviewed: I have personally reviewed following labs and imaging studies  CBC: Recent Labs  Lab 03/19/24 1719 03/20/24 0712  WBC 6.2 7.5  NEUTROABS 3.5  --   HGB 13.4 13.1  HCT 40.8 40.5  MCV 92.5 92.9  PLT 272 250   Basic Metabolic Panel: Recent Labs  Lab 03/19/24 1719 03/19/24 2109 03/20/24 0712  NA 139  --  134*  K 2.5*  --  4.4  CL 112*  --  103  CO2 21*  --  24  GLUCOSE 102*  --  90  BUN 12  --  11  CREATININE 0.62  --  0.82  CALCIUM  6.1*  --  8.6*  MG  --  2.2 2.2   GFR: Estimated Creatinine Clearance: 111 mL/min (by C-G formula based on SCr of 0.82 mg/dL). Liver Function Tests: Recent Labs  Lab  03/19/24 1719  AST 23  ALT 26  ALKPHOS 89  BILITOT 0.2  PROT 4.8*  ALBUMIN  2.6*   Recent Labs  Lab 03/19/24 1719  LIPASE 25   No results for input(s): AMMONIA in the last 168 hours. Coagulation Profile: No results for input(s): INR, PROTIME in the last 168 hours. Cardiac Enzymes: No results for input(s): CKTOTAL, CKMB, CKMBINDEX, TROPONINI in the last 168 hours. BNP (last 3 results) No results for input(s): PROBNP in the last 8760 hours. HbA1C: No results for input(s): HGBA1C in the last 72 hours. CBG: No results for input(s): GLUCAP in the last 168 hours. Lipid Profile: No results for input(s): CHOL, HDL, LDLCALC, TRIG, CHOLHDL, LDLDIRECT in the last 72 hours. Thyroid  Function Tests: No results for input(s): TSH, T4TOTAL, FREET4, T3FREE, THYROIDAB in the last 72 hours. Anemia Panel: No results for input(s): VITAMINB12, FOLATE, FERRITIN, TIBC, IRON, RETICCTPCT in the last 72 hours. Sepsis Labs: No results for input(s): PROCALCITON, LATICACIDVEN in the last 168 hours.  No results found for this or any previous visit (from the past 240 hours).       Radiology Studies: CT ABDOMEN PELVIS WO CONTRAST  Result Date: 03/20/2024 EXAM: CT ABDOMEN AND PELVIS WITHOUT CONTRAST 03/20/2024 01:31:59 AM TECHNIQUE: CT of the abdomen and pelvis was performed without the administration of intravenous contrast. Multiplanar reformatted images are provided for review. Automated exposure control, iterative reconstruction, and/or weight-based adjustment of the mA/kV was utilized to reduce the radiation dose to as low as reasonably achievable. COMPARISON: 03/19/2024 CLINICAL HISTORY: Peritonitis or perforation suspected; repeat CT a/p with oral contrast. FINDINGS: LOWER CHEST: Dependent atelectasis in the lower lobes. LIVER: The liver is unremarkable. GALLBLADDER AND BILE DUCTS: Prior cholecystectomy. No biliary ductal dilatation. SPLEEN: No  acute abnormality. PANCREAS: No acute abnormality. ADRENAL GLANDS: No acute abnormality. KIDNEYS, URETERS AND BLADDER: No stones in the kidneys or ureters. No hydronephrosis. No perinephric or periureteral stranding. Urinary bladder is unremarkable. GI AND BOWEL: Stomach demonstrates no acute abnormality. Small bowel and colonic diverticula. No evidence of active diverticulitis. Mildly prominent small bowel loops, likely mild ileus. Oral contrast material throughout the small bowel. Despite this, exact source of free air not identified. There is no bowel obstruction. PERITONEUM AND RETROPERITONEUM: Free intraperitoneal air noted in the abdomen. This is unchanged since the prior study. No ascites. VASCULATURE: Aorta is normal in caliber. LYMPH NODES: Enlarged right inguinal lymph node with a short axis diameter of 2 cm. REPRODUCTIVE ORGANS: No acute abnormality. BONES AND SOFT TISSUES: No acute osseous abnormality. No focal soft tissue abnormality. IMPRESSION: 1. Free intraperitoneal air, unchanged from the prior exam; source not identified. 2. Scattered small bowel and moderate to advanced large bowel diverticulosis. 3. Mild ileus with mildly prominent small bowel loops. 4. Enlarged right inguinal lymph node measuring 2 cm in short axis. Electronically signed by: Franky Crease MD 03/20/2024 01:42 AM EST RP Workstation: [id number]   CT ABDOMEN PELVIS W CONTRAST Result Date: 03/19/2024 EXAM: CT ABDOMEN AND PELVIS WITH CONTRAST 03/19/2024 07:19:48 PM TECHNIQUE: CT of the abdomen and pelvis was performed with the administration of 100 mL of iohexol  (OMNIPAQUE ) 300 MG/ML solution. Multiplanar reformatted images are provided for review. Automated exposure control, iterative reconstruction, and/or weight-based adjustment of the mA/kV was utilized to reduce the radiation dose to as low as reasonably achievable. COMPARISON: 03/16/2024 CLINICAL HISTORY: Pneumoperitoneum noted on outpatient chest CT. FINDINGS: LOWER CHEST:  Lung bases show bronchiectasis similar to that seen on recent chest CT. No focal confluent infiltrate is seen. LIVER: The liver is within normal limits. GALLBLADDER AND BILE DUCTS: Gallbladder has been surgically removed. No biliary ductal dilatation. SPLEEN: The spleen is within normal limits. PANCREAS: The pancreas is within normal limits. A large duodenal diverticulum is noted adjacent to the head of the pancreas. ADRENAL GLANDS: The adrenal glands are within normal limits. KIDNEYS, URETERS AND BLADDER: The kidneys demonstrate a normal enhancement pattern bilaterally. No renal calculi or obstructive changes are seen. The bladder is partially distended. GI AND BOWEL: The stomach is well distended with air and fluid. Fecal material is noted throughout the colon. Extensive colonic diverticular change is noted without evidence of diverticulitis. The appendix is not discretely visualized and may have been surgically removed. No inflammatory changes to suggest appendicitis are seen. The small bowel demonstrates multiple sizable small bowel diverticula. Additionally, there are scattered loops of mildly dilated small bowel identified. PERITONEUM AND RETROPERITONEUM: Free intraperitoneal air is noted throughout the abdomen similar to that seen on prior CT of the chest. The free air has likely resulted from a rupture of a small bowel diverticulum. The definitive area of perforation is difficult to evaluate on this exam. No significant free  pelvic fluid is noted. VASCULATURE: The abdominal aorta is within normal limits. LYMPH NODES: No lymphadenopathy is seen. REPRODUCTIVE ORGANS: The prostate is within normal limits. BONES AND SOFT TISSUES: The bony structures show degenerative change of the lumbar spine. No acute osseous abnormality. No focal soft tissue abnormality. IMPRESSION: 1. Free intraperitoneal air, likely from perforated small bowel diverticulum, with the exact perforation site indeterminate on this exam. 2.  Fluid-filled, mildly dilated loops of small bowel in the mid abdomen, likely reactive ileus Results were called to Dr. Randol at the time of exam interpretation. Electronically signed by: Oneil Devonshire MD 03/19/2024 07:38 PM EST RP Workstation: [id number]        Scheduled Meds:  atorvastatin   80 mg Oral Daily   levothyroxine   50 mcg Oral Q0600   mexiletine  300 mg Oral BID   pantoprazole  (PROTONIX ) IV  40 mg Intravenous Daily   sacubitril -valsartan   1 tablet Oral BID   sodium chloride  flush  3 mL Intravenous Q12H   umeclidinium-vilanterol  1 puff Inhalation Daily   Continuous Infusions:  0.9 % NaCl with KCl 20 mEq / L 75 mL/hr at 03/19/24 2101     LOS: 1 day    Time spent: 50 minutes spent on 03/20/2024 caring for this patient face-to-face including chart review, ordering labs/tests, documenting, discussion with nursing staff, consultants, updating family and interview/physical exam    Jonathan PARAS Candy Ziegler, DO [facility] Available via Epic secure chat 7am-7pm After these hours, please refer to coverage provider listed on [email or web address] 03/20/2024, 12:06 PM

## 2024-03-20 NOTE — Progress Notes (Signed)
 Subjective/Chief Complaint: No ab pain, no n/v, no issue with bms   Objective: Vital signs in last 24 hours: Temp:  [97.6 F (36.4 C)-98.5 F (36.9 C)] 98.2 F (36.8 C) (11/23 0508) Pulse Rate:  [66-94] 66 (11/23 0508) Resp:  [14-20] 20 (11/23 0123) BP: (99-138)/(66-89) 99/66 (11/23 0508) SpO2:  [93 %-97 %] 96 % (11/22 1900) Weight:  [81.4 kg-81.6 kg] 81.5 kg (11/23 0500) Last BM Date : 03/19/24  Intake/Output from previous day: 11/22 0701 - 11/23 0700 In: 1299.3 [P.O.:1000; I.V.:299.3] Out: 700 [Urine:700] Intake/Output this shift: No intake/output data recorded.  Ab soft nontender nondistended  Lab Results:  Recent Labs    03/19/24 1719 03/20/24 0712  WBC 6.2 7.5  HGB 13.4 13.1  HCT 40.8 40.5  PLT 272 250   BMET Recent Labs    03/19/24 1719  NA 139  K 2.5*  CL 112*  CO2 21*  GLUCOSE 102*  BUN 12  CREATININE 0.62  CALCIUM  6.1*   PT/INR No results for input(s): LABPROT, INR in the last 72 hours. ABG No results for input(s): PHART, HCO3 in the last 72 hours.  Invalid input(s): PCO2, PO2  Studies/Results: CT ABDOMEN PELVIS WO CONTRAST Result Date: 03/20/2024 EXAM: CT ABDOMEN AND PELVIS WITHOUT CONTRAST 03/20/2024 01:31:59 AM TECHNIQUE: CT of the abdomen and pelvis was performed without the administration of intravenous contrast. Multiplanar reformatted images are provided for review. Automated exposure control, iterative reconstruction, and/or weight-based adjustment of the mA/kV was utilized to reduce the radiation dose to as low as reasonably achievable. COMPARISON: 03/19/2024 CLINICAL HISTORY: Peritonitis or perforation suspected; repeat CT a/p with oral contrast. FINDINGS: LOWER CHEST: Dependent atelectasis in the lower lobes. LIVER: The liver is unremarkable. GALLBLADDER AND BILE DUCTS: Prior cholecystectomy. No biliary ductal dilatation. SPLEEN: No acute abnormality. PANCREAS: No acute abnormality. ADRENAL GLANDS: No acute abnormality.  KIDNEYS, URETERS AND BLADDER: No stones in the kidneys or ureters. No hydronephrosis. No perinephric or periureteral stranding. Urinary bladder is unremarkable. GI AND BOWEL: Stomach demonstrates no acute abnormality. Small bowel and colonic diverticula. No evidence of active diverticulitis. Mildly prominent small bowel loops, likely mild ileus. Oral contrast material throughout the small bowel. Despite this, exact source of free air not identified. There is no bowel obstruction. PERITONEUM AND RETROPERITONEUM: Free intraperitoneal air noted in the abdomen. This is unchanged since the prior study. No ascites. VASCULATURE: Aorta is normal in caliber. LYMPH NODES: Enlarged right inguinal lymph node with a short axis diameter of 2 cm. REPRODUCTIVE ORGANS: No acute abnormality. BONES AND SOFT TISSUES: No acute osseous abnormality. No focal soft tissue abnormality. IMPRESSION: 1. Free intraperitoneal air, unchanged from the prior exam; source not identified. 2. Scattered small bowel and moderate to advanced large bowel diverticulosis. 3. Mild ileus with mildly prominent small bowel loops. 4. Enlarged right inguinal lymph node measuring 2 cm in short axis. Electronically signed by: Franky Crease MD 03/20/2024 01:42 AM EST RP Workstation: [id number]   CT ABDOMEN PELVIS W CONTRAST Result Date: 03/19/2024 EXAM: CT ABDOMEN AND PELVIS WITH CONTRAST 03/19/2024 07:19:48 PM TECHNIQUE: CT of the abdomen and pelvis was performed with the administration of 100 mL of iohexol  (OMNIPAQUE ) 300 MG/ML solution. Multiplanar reformatted images are provided for review. Automated exposure control, iterative reconstruction, and/or weight-based adjustment of the mA/kV was utilized to reduce the radiation dose to as low as reasonably achievable. COMPARISON: 03/16/2024 CLINICAL HISTORY: Pneumoperitoneum noted on outpatient chest CT. FINDINGS: LOWER CHEST: Lung bases show bronchiectasis similar to that seen on recent chest CT.  No focal confluent  infiltrate is seen. LIVER: The liver is within normal limits. GALLBLADDER AND BILE DUCTS: Gallbladder has been surgically removed. No biliary ductal dilatation. SPLEEN: The spleen is within normal limits. PANCREAS: The pancreas is within normal limits. A large duodenal diverticulum is noted adjacent to the head of the pancreas. ADRENAL GLANDS: The adrenal glands are within normal limits. KIDNEYS, URETERS AND BLADDER: The kidneys demonstrate a normal enhancement pattern bilaterally. No renal calculi or obstructive changes are seen. The bladder is partially distended. GI AND BOWEL: The stomach is well distended with air and fluid. Fecal material is noted throughout the colon. Extensive colonic diverticular change is noted without evidence of diverticulitis. The appendix is not discretely visualized and may have been surgically removed. No inflammatory changes to suggest appendicitis are seen. The small bowel demonstrates multiple sizable small bowel diverticula. Additionally, there are scattered loops of mildly dilated small bowel identified. PERITONEUM AND RETROPERITONEUM: Free intraperitoneal air is noted throughout the abdomen similar to that seen on prior CT of the chest. The free air has likely resulted from a rupture of a small bowel diverticulum. The definitive area of perforation is difficult to evaluate on this exam. No significant free pelvic fluid is noted. VASCULATURE: The abdominal aorta is within normal limits. LYMPH NODES: No lymphadenopathy is seen. REPRODUCTIVE ORGANS: The prostate is within normal limits. BONES AND SOFT TISSUES: The bony structures show degenerative change of the lumbar spine. No acute osseous abnormality. No focal soft tissue abnormality. IMPRESSION: 1. Free intraperitoneal air, likely from perforated small bowel diverticulum, with the exact perforation site indeterminate on this exam. 2. Fluid-filled, mildly dilated loops of small bowel in the mid abdomen, likely reactive ileus  Results were called to Dr. Randol at the time of exam interpretation. Electronically signed by: Oneil Devonshire MD 03/19/2024 07:38 PM EST RP Workstation: [id number]    Anti-infectives: Anti-infectives (From admission, onward)    None       Assessment/Plan: Small volume pneumoperitoneum with unclear etiology -repeat ct with oral shows some air, has scattered diverticuli and mild ileus on the ct scan no clear source -clinically asymptomatic, wbc normal -no surgery indicated -will do fulls today and soft in am and see how he does -pharm dvt prophylaxis recommended -may need endoscopy as outpatient  I reviewed hospitalist notes, last 24 h vitals and pain scores, last 48 h intake and output, last 24 h labs and trends, and last 24 h imaging results.     Donnice Bury 03/20/2024

## 2024-03-20 NOTE — Plan of Care (Signed)
 Problem: Education: Goal: Knowledge of General Education information will improve Description: Including pain rating scale, medication(s)/side effects and non-pharmacologic comfort measures 03/20/2024 0714 by Juanita Leisa CROME, RN Outcome: Progressing 03/20/2024 0714 by Juanita Leisa CROME, RN Outcome: Progressing 03/20/2024 0713 by Juanita Leisa CROME, RN Outcome: Not Progressing   Problem: Health Behavior/Discharge Planning: Goal: Ability to manage health-related needs will improve 03/20/2024 0714 by Juanita Leisa CROME, RN Outcome: Progressing 03/20/2024 0714 by Juanita Leisa CROME, RN Outcome: Not Progressing 03/20/2024 0713 by Juanita Leisa CROME, RN Outcome: Not Progressing   Problem: Clinical Measurements: Goal: Will remain free from infection 03/20/2024 0714 by Juanita Leisa CROME, RN Outcome: Progressing 03/20/2024 0714 by Juanita Leisa CROME, RN Outcome: Progressing 03/20/2024 0713 by Juanita Leisa CROME, RN Outcome: Not Progressing Goal: Diagnostic test results will improve 03/20/2024 0714 by Juanita Leisa CROME, RN Outcome: Progressing 03/20/2024 0714 by Juanita Leisa CROME, RN Outcome: Progressing 03/20/2024 0713 by Juanita Leisa CROME, RN Outcome: Not Progressing Goal: Respiratory complications will improve 03/20/2024 0714 by Juanita Leisa CROME, RN Outcome: Progressing 03/20/2024 0714 by Juanita Leisa CROME, RN Outcome: Progressing 03/20/2024 0713 by Juanita Leisa CROME, RN Outcome: Not Progressing Goal: Cardiovascular complication will be avoided 03/20/2024 0714 by Juanita Leisa CROME, RN Outcome: Progressing 03/20/2024 0714 by Juanita Leisa CROME, RN Outcome: Progressing 03/20/2024 0713 by Juanita Leisa CROME, RN Outcome: Not Progressing   Problem: Activity: Goal: Risk for activity intolerance will decrease 03/20/2024 0714 by Juanita Leisa CROME, RN Outcome: Progressing 03/20/2024 0714 by Juanita Leisa CROME, RN Outcome: Progressing 03/20/2024 0713 by Juanita Leisa CROME, RN Outcome: Not Progressing   Problem: Nutrition: Goal: Adequate nutrition will be maintained 03/20/2024 0714 by Juanita Leisa CROME, RN Outcome: Progressing 03/20/2024 0714 by Juanita Leisa CROME, RN Outcome: Progressing 03/20/2024 0713 by Juanita Leisa CROME, RN Outcome: Not Progressing   Problem: Coping: Goal: Level of anxiety will decrease 03/20/2024 0714 by Juanita Leisa CROME, RN Outcome: Progressing 03/20/2024 0714 by Juanita Leisa CROME, RN Outcome: Progressing 03/20/2024 0713 by Juanita Leisa CROME, RN Outcome: Not Progressing   Problem: Elimination: Goal: Will not experience complications related to bowel motility 03/20/2024 0714 by Juanita Leisa CROME, RN Outcome: Progressing 03/20/2024 0714 by Juanita Leisa CROME, RN Outcome: Progressing 03/20/2024 0713 by Juanita Leisa CROME, RN Outcome: Not Progressing Goal: Will not experience complications related to urinary retention 03/20/2024 0714 by Juanita Leisa CROME, RN Outcome: Progressing 03/20/2024 0714 by Juanita Leisa CROME, RN Outcome: Progressing 03/20/2024 0713 by Juanita Leisa CROME, RN Outcome: Not Progressing   Problem: Pain Managment: Goal: General experience of comfort will improve and/or be controlled 03/20/2024 0714 by Juanita Leisa CROME, RN Outcome: Progressing 03/20/2024 0714 by Juanita Leisa CROME, RN Outcome: Progressing 03/20/2024 0713 by Juanita Leisa CROME, RN Outcome: Not Progressing   Problem: Safety: Goal: Ability to remain free from injury will improve 03/20/2024 0714 by Juanita Leisa CROME, RN Outcome: Progressing 03/20/2024 0714 by Juanita Leisa CROME, RN Outcome: Progressing 03/20/2024 0713 by Juanita Leisa CROME, RN Outcome: Not Progressing   Problem: Skin Integrity: Goal: Risk for impaired skin integrity will decrease 03/20/2024 0714 by Juanita Leisa CROME, RN Outcome: Progressing 03/20/2024 0714 by Juanita Leisa CROME, RN Outcome: Progressing 03/20/2024 0713 by Juanita Leisa CROME, RN Outcome: Not Progressing   Problem: Clinical Measurements: Goal: Ability to maintain clinical measurements within normal limits will improve 03/20/2024 0714 by Juanita Leisa CROME, RN Outcome: Not Progressing 03/20/2024 0714 by Juanita Leisa CROME, RN Outcome: Not Progressing 03/20/2024 0713 by Juanita Leisa CROME, RN Outcome: Not Progressing

## 2024-03-20 NOTE — Plan of Care (Signed)
  Problem: Education: Goal: Knowledge of General Education information will improve Description: Including pain rating scale, medication(s)/side effects and non-pharmacologic comfort measures 03/20/2024 0714 by Juanita Leisa CROME, RN Outcome: Progressing 03/20/2024 0713 by Juanita Leisa CROME, RN Outcome: Not Progressing   Problem: Clinical Measurements: Goal: Will remain free from infection 03/20/2024 0714 by Juanita Leisa CROME, RN Outcome: Progressing 03/20/2024 0713 by Juanita Leisa CROME, RN Outcome: Not Progressing Goal: Diagnostic test results will improve 03/20/2024 0714 by Juanita Leisa CROME, RN Outcome: Progressing 03/20/2024 0713 by Juanita Leisa CROME, RN Outcome: Not Progressing Goal: Respiratory complications will improve 03/20/2024 0714 by Juanita Leisa CROME, RN Outcome: Progressing 03/20/2024 0713 by Juanita Leisa CROME, RN Outcome: Not Progressing Goal: Cardiovascular complication will be avoided 03/20/2024 0714 by Juanita Leisa CROME, RN Outcome: Progressing 03/20/2024 0713 by Juanita Leisa CROME, RN Outcome: Not Progressing   Problem: Activity: Goal: Risk for activity intolerance will decrease 03/20/2024 0714 by Juanita Leisa CROME, RN Outcome: Progressing 03/20/2024 0713 by Juanita Leisa CROME, RN Outcome: Not Progressing   Problem: Nutrition: Goal: Adequate nutrition will be maintained 03/20/2024 0714 by Juanita Leisa CROME, RN Outcome: Progressing 03/20/2024 0713 by Juanita Leisa CROME, RN Outcome: Not Progressing   Problem: Coping: Goal: Level of anxiety will decrease 03/20/2024 0714 by Juanita Leisa CROME, RN Outcome: Progressing 03/20/2024 0713 by Juanita Leisa CROME, RN Outcome: Not Progressing   Problem: Elimination: Goal: Will not experience complications related to bowel motility 03/20/2024 0714 by Juanita Leisa CROME, RN Outcome: Progressing 03/20/2024 0713 by Juanita Leisa CROME, RN Outcome: Not Progressing Goal: Will not experience  complications related to urinary retention 03/20/2024 0714 by Juanita Leisa CROME, RN Outcome: Progressing 03/20/2024 0713 by Juanita Leisa CROME, RN Outcome: Not Progressing   Problem: Pain Managment: Goal: General experience of comfort will improve and/or be controlled 03/20/2024 0714 by Juanita Leisa CROME, RN Outcome: Progressing 03/20/2024 0713 by Juanita Leisa CROME, RN Outcome: Not Progressing   Problem: Safety: Goal: Ability to remain free from injury will improve 03/20/2024 0714 by Juanita Leisa CROME, RN Outcome: Progressing 03/20/2024 0713 by Juanita Leisa CROME, RN Outcome: Not Progressing   Problem: Skin Integrity: Goal: Risk for impaired skin integrity will decrease 03/20/2024 0714 by Juanita Leisa CROME, RN Outcome: Progressing 03/20/2024 0713 by Juanita Leisa CROME, RN Outcome: Not Progressing   Problem: Health Behavior/Discharge Planning: Goal: Ability to manage health-related needs will improve 03/20/2024 0714 by Juanita Leisa CROME, RN Outcome: Not Progressing 03/20/2024 0713 by Juanita Leisa CROME, RN Outcome: Not Progressing   Problem: Clinical Measurements: Goal: Ability to maintain clinical measurements within normal limits will improve 03/20/2024 0714 by Juanita Leisa CROME, RN Outcome: Not Progressing 03/20/2024 0713 by Juanita Leisa CROME, RN Outcome: Not Progressing

## 2024-03-20 NOTE — Plan of Care (Signed)

## 2024-03-20 NOTE — Plan of Care (Signed)

## 2024-03-21 DIAGNOSIS — K668 Other specified disorders of peritoneum: Secondary | ICD-10-CM | POA: Diagnosis not present

## 2024-03-21 LAB — MAGNESIUM: Magnesium: 2.4 mg/dL (ref 1.7–2.4)

## 2024-03-21 LAB — CBC
HCT: 41.7 % (ref 39.0–52.0)
Hemoglobin: 13.5 g/dL (ref 13.0–17.0)
MCH: 30.5 pg (ref 26.0–34.0)
MCHC: 32.4 g/dL (ref 30.0–36.0)
MCV: 94.1 fL (ref 80.0–100.0)
Platelets: 260 K/uL (ref 150–400)
RBC: 4.43 MIL/uL (ref 4.22–5.81)
RDW: 13.4 % (ref 11.5–15.5)
WBC: 11.4 K/uL — ABNORMAL HIGH (ref 4.0–10.5)
nRBC: 0 % (ref 0.0–0.2)

## 2024-03-21 LAB — BASIC METABOLIC PANEL WITH GFR
Anion gap: 8 (ref 5–15)
BUN: 11 mg/dL (ref 6–20)
CO2: 25 mmol/L (ref 22–32)
Calcium: 9 mg/dL (ref 8.9–10.3)
Chloride: 101 mmol/L (ref 98–111)
Creatinine, Ser: 0.95 mg/dL (ref 0.61–1.24)
GFR, Estimated: >60 mL/min (ref >60)
Glucose, Bld: 85 mg/dL (ref 70–99)
Potassium: 4.2 mmol/L (ref 3.5–5.1)
Sodium: 134 mmol/L — ABNORMAL LOW (ref 135–145)

## 2024-03-21 LAB — PHOSPHORUS: Phosphorus: 4.1 mg/dL (ref 2.5–4.6)

## 2024-03-21 NOTE — Progress Notes (Signed)
   03/21/24 0853  TOC Brief Assessment  Insurance and Status Reviewed  Patient has primary care physician Yes  Home environment has been reviewed Resides in an apartment  Prior level of function: Independent with ADLs at baseline  Prior/Current Home Services No current home services  Social Drivers of Health Review SDOH reviewed no interventions necessary  Readmission risk has been reviewed Yes  Transition of care needs no transition of care needs at this time

## 2024-03-21 NOTE — Plan of Care (Signed)
  Problem: Education: Goal: Knowledge of General Education information will improve Description: Including pain rating scale, medication(s)/side effects and non-pharmacologic comfort measures 03/21/2024 1439 by Chad Tiznado C, RN Outcome: Adequate for Discharge 03/21/2024 1214 by Ilena Dieckman C, RN Outcome: Progressing   Problem: Health Behavior/Discharge Planning: Goal: Ability to manage health-related needs will improve 03/21/2024 1439 by Evora Schechter C, RN Outcome: Adequate for Discharge 03/21/2024 1214 by Carzell Saldivar C, RN Outcome: Progressing   Problem: Clinical Measurements: Goal: Ability to maintain clinical measurements within normal limits will improve 03/21/2024 1439 by Ahmia Colford C, RN Outcome: Adequate for Discharge 03/21/2024 1214 by Jordanna Hendrie C, RN Outcome: Progressing Goal: Will remain free from infection 03/21/2024 1439 by Adoni Greenough C, RN Outcome: Adequate for Discharge 03/21/2024 1214 by Wilhelmena Zea C, RN Outcome: Progressing Goal: Diagnostic test results will improve 03/21/2024 1439 by Donica Derouin C, RN Outcome: Adequate for Discharge 03/21/2024 1214 by Brantlee Penn C, RN Outcome: Progressing Goal: Respiratory complications will improve 03/21/2024 1439 by Cyan Clippinger C, RN Outcome: Adequate for Discharge 03/21/2024 1214 by Anhar Mcdermott C, RN Outcome: Progressing Goal: Cardiovascular complication will be avoided 03/21/2024 1439 by Tilly Pernice C, RN Outcome: Adequate for Discharge 03/21/2024 1214 by Teniyah Seivert C, RN Outcome: Progressing   Problem: Activity: Goal: Risk for activity intolerance will decrease 03/21/2024 1439 by Dionta Larke C, RN Outcome: Adequate for Discharge 03/21/2024 1214 by Arwin Carolyn BROCKS, RN Outcome: Progressing   Problem: Nutrition: Goal: Adequate nutrition will be maintained 03/21/2024 1439 by Crandall Harvel C, RN Outcome: Adequate for Discharge 03/21/2024 1214  by Zoltan Carolyn BROCKS, RN Outcome: Progressing   Problem: Coping: Goal: Level of anxiety will decrease 03/21/2024 1439 by Diarra Ceja C, RN Outcome: Adequate for Discharge 03/21/2024 1214 by Mazin Emma C, RN Outcome: Progressing   Problem: Elimination: Goal: Will not experience complications related to bowel motility 03/21/2024 1439 by Jamel Dunton C, RN Outcome: Adequate for Discharge 03/21/2024 1214 by Kenniyah Sasaki C, RN Outcome: Progressing Goal: Will not experience complications related to urinary retention 03/21/2024 1439 by Kimbra Marcelino C, RN Outcome: Adequate for Discharge 03/21/2024 1214 by Daiel Strohecker C, RN Outcome: Progressing   Problem: Pain Managment: Goal: General experience of comfort will improve and/or be controlled 03/21/2024 1439 by Jonquil Stubbe C, RN Outcome: Adequate for Discharge 03/21/2024 1214 by Zephaniah Enyeart C, RN Outcome: Progressing   Problem: Safety: Goal: Ability to remain free from injury will improve 03/21/2024 1439 by Jadiel Schmieder C, RN Outcome: Adequate for Discharge 03/21/2024 1214 by Roselie Cirigliano C, RN Outcome: Progressing   Problem: Skin Integrity: Goal: Risk for impaired skin integrity will decrease 03/21/2024 1439 by Carmine Carrozza C, RN Outcome: Adequate for Discharge 03/21/2024 1214 by Rally Ouch C, RN Outcome: Progressing

## 2024-03-21 NOTE — Progress Notes (Signed)
 I attest to student documentation.  Brek Reece V. Tashaun Obey, MSN-RN Nursing Faculty/Clinical Instructor [facility] Associate Degree Nursing Program

## 2024-03-21 NOTE — Discharge Summary (Signed)
 Physician Discharge Summary  GANON DEMASI FMW:[id number] DOB: 21-Dec-1965 DOA: 03/19/2024  PCP: Delbert Clam, MD  Admit date: 03/19/2024 Discharge date: 03/21/2024  Admitted From: Home Disposition: Home  Recommendations for Outpatient Follow-up:  Follow up with PCP in 1-2 weeks Follow with  gastroenterology for consideration of EGD for incidental finding of pneumoperitoneum of unclear etiology  Home Health: No Equipment/Devices: None  Discharge Condition: Stable CODE STATUS: Full code Diet recommendation: Heart healthy diet  History of present illness:  Jonathan King is a 58 y.o. male with past medical history significant for CAD s/p CABG, chronic HFmrEF, hypothyroidism, frequent PVCs, ILD who presented to [facility] ED on 03/19/2024 for evaluation of abnormal outpatient CT chest.  Patient followed by pulmonology for ILD, underwent outpatient CT chest a few days prior with findings concerning of free air on imaging.  Patient reports he was directed by the pulmonology clinic to proceed to the ED for further evaluation of the free intra-abdominal air noted on the CT.  Patient denies any recent or current abdominal pain, no distention, no loss of appetite, no nausea/vomiting/diarrhea, no fever/chills.   In the ED, temperature 98.5 F, HR 94, RR 20, BP 118/86, SpO2 97% on room air.  WBC 6.2, hemoglobin 13.4, platelet count 272.  Sodium 139, potassium 2.5, chloride 112, CO2 21, glucose 102, BUN 12, creatinine 0.62.  Calcium  6.1.  Lipase 25, AST 23, ALT 26, total bili 0.2.  Magnesium  2.2.  Urinalysis unrevealing.  CT abdomen/pelvis with contrast with findings of free intraperitoneal air likely from perforated small bowel diverticulum with exact perforation site indeterminate, fluid-filled mildly dilated loops of small bowel in the midabdomen likely reactive ileus.  General surgery was consulted.  TRH consulted for admission for further evaluation and management of pneumoperitoneum of unclear  etiology.    Hospital course:  Pneumoperitoneum, unclear etiology Patient presenting to the ED as directed by his pulmonologist office after abnormal findings noted on CT chest.  CT chest outpatient concerning for findings of intra-abdominal free air.  Patient has been asymptomatic, denies any abdominal pain, no abdominal distention without nausea/vomiting or diarrhea.  Repeat CT abdomen/pelvis on admission with findings of free intraperitoneal air likely from perforated small bowel diverticulum of unclear site.  General surgery was consulted and patient underwent repeat CT abdomen/pelvis with oral contrast with repeat findings of free intraperitoneal air, unchanged from prior exam source not identified.  Patient was started on a diet with slow oral advancement with toleration.  Remains asymptomatic.  No further recommendations from general surgery; .  Recommend outpatient follow-up with PCP, needs follow-up with [facility] gastroenterology for consideration of EGD.   Hypokalemia Hypocalcemia Repleted   CAD s/p CABG HFmrEF, chronic History of frequent PVCs TTE 02/24/2023 with LVEF 40-45%, LV with mildly decreased function with global hypokinesis, diastolic parameters indeterminate, RV systolic function moderately reduced, RV size severely enlarged, IVC normal in size, no air stenosis.  Continue home Entresto  24-26 mg p.o. twice daily, Carvedilol  3.125 g p.o. twice daily, Spironolactone  20 mg p.o. daily,  Mexiletine 3 mg p.o. twice daily   HLD Atorvastatin  80 mg p.o. daily   Hypothyroidism Levothyroxine  50 mcg p.o. daily   Interstitial lung disease Anoro Ellipta  1 puff daily.  Outpatient follow-up with pulmonology as scheduled on 05/18/2024  Discharge Diagnoses:  Principal Problem:   Bowel perforation (HCC) Active Problems:   Chronic heart failure with mildly reduced ejection fraction (HFmrEF) (HCC)   Hypokalemia   Hypothyroidism   PVC's (premature ventricular contractions)   CAD  (  coronary artery disease)    Discharge Instructions  Discharge Instructions     Call MD for:  difficulty breathing, headache or visual disturbances   Complete by: As directed    Call MD for:  extreme fatigue   Complete by: As directed    Call MD for:  persistant dizziness or light-headedness   Complete by: As directed    Call MD for:  persistant nausea and vomiting   Complete by: As directed    Call MD for:  severe uncontrolled pain   Complete by: As directed    Call MD for:  temperature >100.4   Complete by: As directed    Diet - low sodium heart healthy   Complete by: As directed    Increase activity slowly   Complete by: As directed       Allergies as of 03/21/2024   No Known Allergies      Medication List     TAKE these medications    Anoro Ellipta  62.5-25 MCG/ACT Aepb Generic drug: umeclidinium-vilanterol Inhale 1 puff into the lungs daily.   aspirin  EC 81 MG tablet Take 1 tablet (81 mg total) by mouth daily.   atorvastatin  80 MG tablet Commonly known as: LIPITOR  Take 1 tablet (80 mg total) by mouth daily.   benzonatate  200 MG capsule Commonly known as: TESSALON  Take 1 capsule (200 mg total) by mouth 3 (three) times daily as needed for cough.   carvedilol  3.125 MG tablet Commonly known as: COREG  Take 1 tablet (3.125 mg total) by mouth 2 (two) times daily.   cetirizine  10 MG tablet Commonly known as: ZYRTEC  Take 1 tablet (10 mg total) by mouth daily.   Entresto  24-26 MG Generic drug: sacubitril -valsartan  Take 1 tablet by mouth 2 (two) times daily.   Farxiga  10 MG Tabs tablet Generic drug: dapagliflozin  propanediol Take 1 tablet (10 mg total) by mouth daily before breakfast.   levothyroxine  50 MCG tablet Commonly known as: Synthroid  Take 1 tablet (50 mcg total) by mouth daily before breakfast.   mexiletine 150 MG capsule Commonly known as: MEXITIL  Take 2 capsules (300 mg total) by mouth 2 (two) times daily.   potassium chloride  10 MEQ  tablet Commonly known as: KLOR-CON  Take 1 tablet (10 mEq total) by mouth daily. Needs appt for further refills   spironolactone  25 MG tablet Commonly known as: ALDACTONE  Take 1 tablet (25 mg total) by mouth daily.        Follow-up Information     Delbert Clam, MD. Schedule an appointment as soon as possible for a visit in 1 week(s).   Specialty: Family Medicine Contact information: [address] [place] [address] [phone]         [facility]. Schedule an appointment as soon as possible for a visit.   Why: For consideration of upper endoscopy given pneumoperitoneum        Neda Hammond A, MD. Go on 05/18/2024.   Specialty: Pulmonary Disease Contact information: [address] [place] [address] [phone]                No Known Allergies  Consultations: General surgery   Procedures/Studies: CT ABDOMEN PELVIS WO CONTRAST Result Date: 03/20/2024 EXAM: CT ABDOMEN AND PELVIS WITHOUT CONTRAST 03/20/2024 01:31:59 AM TECHNIQUE: CT of the abdomen and pelvis was performed without the administration of intravenous contrast. Multiplanar reformatted images are provided for review. Automated exposure control, iterative reconstruction, and/or weight-based adjustment of the mA/kV was utilized to  reduce the radiation dose to as low as reasonably achievable. COMPARISON: 03/19/2024 CLINICAL HISTORY: Peritonitis or perforation suspected; repeat CT a/p with oral contrast. FINDINGS: LOWER CHEST: Dependent atelectasis in the lower lobes. LIVER: The liver is unremarkable. GALLBLADDER AND BILE DUCTS: Prior cholecystectomy. No biliary ductal dilatation. SPLEEN: No acute abnormality. PANCREAS: No acute abnormality. ADRENAL GLANDS: No acute abnormality. KIDNEYS, URETERS AND BLADDER: No stones in the kidneys or ureters. No hydronephrosis. No perinephric or periureteral stranding. Urinary bladder is unremarkable. GI AND BOWEL: Stomach  demonstrates no acute abnormality. Small bowel and colonic diverticula. No evidence of active diverticulitis. Mildly prominent small bowel loops, likely mild ileus. Oral contrast material throughout the small bowel. Despite this, exact source of free air not identified. There is no bowel obstruction. PERITONEUM AND RETROPERITONEUM: Free intraperitoneal air noted in the abdomen. This is unchanged since the prior study. No ascites. VASCULATURE: Aorta is normal in caliber. LYMPH NODES: Enlarged right inguinal lymph node with a short axis diameter of 2 cm. REPRODUCTIVE ORGANS: No acute abnormality. BONES AND SOFT TISSUES: No acute osseous abnormality. No focal soft tissue abnormality. IMPRESSION: 1. Free intraperitoneal air, unchanged from the prior exam; source not identified. 2. Scattered small bowel and moderate to advanced large bowel diverticulosis. 3. Mild ileus with mildly prominent small bowel loops. 4. Enlarged right inguinal lymph node measuring 2 cm in short axis. Electronically signed by: Franky Crease MD 03/20/2024 01:42 AM EST RP Workstation: [id number]   CT ABDOMEN PELVIS W CONTRAST Result Date: 03/19/2024 EXAM: CT ABDOMEN AND PELVIS WITH CONTRAST 03/19/2024 07:19:48 PM TECHNIQUE: CT of the abdomen and pelvis was performed with the administration of 100 mL of iohexol  (OMNIPAQUE ) 300 MG/ML solution. Multiplanar reformatted images are provided for review. Automated exposure control, iterative reconstruction, and/or weight-based adjustment of the mA/kV was utilized to reduce the radiation dose to as low as reasonably achievable. COMPARISON: 03/16/2024 CLINICAL HISTORY: Pneumoperitoneum noted on outpatient chest CT. FINDINGS: LOWER CHEST: Lung bases show bronchiectasis similar to that seen on recent chest CT. No focal confluent infiltrate is seen. LIVER: The liver is within normal limits. GALLBLADDER AND BILE DUCTS: Gallbladder has been surgically removed. No biliary ductal dilatation. SPLEEN: The spleen is  within normal limits. PANCREAS: The pancreas is within normal limits. A large duodenal diverticulum is noted adjacent to the head of the pancreas. ADRENAL GLANDS: The adrenal glands are within normal limits. KIDNEYS, URETERS AND BLADDER: The kidneys demonstrate a normal enhancement pattern bilaterally. No renal calculi or obstructive changes are seen. The bladder is partially distended. GI AND BOWEL: The stomach is well distended with air and fluid. Fecal material is noted throughout the colon. Extensive colonic diverticular change is noted without evidence of diverticulitis. The appendix is not discretely visualized and may have been surgically removed. No inflammatory changes to suggest appendicitis are seen. The small bowel demonstrates multiple sizable small bowel diverticula. Additionally, there are scattered loops of mildly dilated small bowel identified. PERITONEUM AND RETROPERITONEUM: Free intraperitoneal air is noted throughout the abdomen similar to that seen on prior CT of the chest. The free air has likely resulted from a rupture of a small bowel diverticulum. The definitive area of perforation is difficult to evaluate on this exam. No significant free pelvic fluid is noted. VASCULATURE: The abdominal aorta is within normal limits. LYMPH NODES: No lymphadenopathy is seen. REPRODUCTIVE ORGANS: The prostate is within normal limits. BONES AND SOFT TISSUES: The bony structures show degenerative change of the lumbar spine. No acute osseous abnormality. No focal soft tissue abnormality.  IMPRESSION: 1. Free intraperitoneal air, likely from perforated small bowel diverticulum, with the exact perforation site indeterminate on this exam. 2. Fluid-filled, mildly dilated loops of small bowel in the mid abdomen, likely reactive ileus Results were called to Dr. Randol at the time of exam interpretation. Electronically signed by: Oneil Devonshire MD 03/19/2024 07:38 PM EST RP Workstation: [id number]   CT Chest High  Resolution Result Date: 03/19/2024 CLINICAL DATA:  Chronic cough. EXAM: CT CHEST WITHOUT CONTRAST TECHNIQUE: Multidetector CT imaging of the chest was performed following the standard protocol without intravenous contrast. High resolution imaging of the lungs, as well as inspiratory and expiratory imaging, was performed. RADIATION DOSE REDUCTION: This exam was performed according to the departmental dose-optimization program which includes automated exposure control, adjustment of the mA and/or kV according to patient size and/or use of iterative reconstruction technique. COMPARISON:  08/21/2022 and 02/03/2022. FINDINGS: Cardiovascular: Atherosclerotic calcification of the aorta with age advanced involvement of all 3 coronary arteries. Heart is at the upper limits of normal in size. No pericardial effusion. Mediastinum/Nodes: No pathologically enlarged mediastinal or axillary lymph nodes. Hilar regions are difficult to definitively evaluate without IV contrast. Marked bilateral gynecomastia. Thoracic inlet lymph nodes are not enlarged by CT size criteria. Esophagus is grossly unremarkable. Lungs/Pleura: Mild basilar cylindrical bronchiectasis and coarsened ground-glass, similar to minimally increased from 02/03/2022. Ill-defined peribronchovascular ground-glass in the posterior segment right upper lobe, new. No pleural fluid. Airway is unremarkable. Expiratory phase imaging was not submitted. Upper Abdomen: There is free air. Cholecystectomy. Duodenal diverticulum, incidentally noted. Visualized portions of the liver, adrenal glands, kidneys, spleen, pancreas, stomach and bowel are otherwise grossly unremarkable. No upper abdominal adenopathy. Musculoskeletal: Degenerative changes in the spine. IMPRESSION: 1. Mild basilar cylindrical bronchiectasis and coarsened ground-glass. Findings are indeterminate for UIP per consensus guidelines: Diagnosis of Idiopathic Pulmonary Fibrosis: An Official ATS/ERS/JRS/ALAT  Clinical Practice Guideline. Am JINNY Honey Crit Care Med Vol 198, Iss 5, [phone], Dec 27 2016. Recommend inclusion of prone inspiratory imaging on any future high resolution CT chest. 2. Pneumoperitoneum. Critical Value/emergent results were called by telephone at the time of interpretation on 03/19/2024 at 3:30 pm to provider FAHID ALGHANIM , who verbally acknowledged these results. 3. Ill-defined peribronchovascular ground-glass in the posterior segment right upper lobe is new and indicative of an infectious bronchiolitis. 4.  Age advanced three-vessel coronary artery calcification. 5.  Aortic atherosclerosis (ICD10-I70.0). Electronically Signed   By: Newell Eke M.D.   On: 03/19/2024 15:31     Subjective: Patient seen and examined at bedside, lying in bed.  Roommate present.  Remains asymptomatic, no abdominal pain.  Tolerating diet.  Seen by general surgery with no further recommendations.  Discharging home.  Patient denies headache, no visual changes, no chest pain, no palpitations, no shortness of breath, no abdominal pain, no fever/chills/night sweats, no nausea/vomiting/diarrhea, no focal weakness, no fatigue, no paresthesias.  No acute events overnight per nursing staff.  Discharge Exam: Vitals:   03/21/24 0920 03/21/24 1350  BP: 99/65 117/81  Pulse: 86 86  Resp: 18 18  Temp: 98.4 F (36.9 C) 98.4 F (36.9 C)  SpO2: 96%    Vitals:   03/20/24 2006 03/21/24 0605 03/21/24 0920 03/21/24 1350  BP: 95/78 94/64 99/65  117/81  Pulse: 74 76 86 86  Resp: 18 17 18 18   Temp: 98.3 F (36.8 C) 98 F (36.7 C) 98.4 F (36.9 C) 98.4 F (36.9 C)  TempSrc:   Oral Oral  SpO2: 95% 94% 96%   Weight:  Height:        Physical Exam: GEN: NAD, alert and oriented x 3, wd/wn HEENT: NCAT, PERRL, EOMI, sclera clear, MMM PULM: CTAB w/o wheezes/crackles, normal respiratory effort, on room air CV: RRR w/o M/G/R GI: abd soft, NTND, NABS, no R/G/M MSK: no peripheral edema, muscle strength  globally intact 5/5 bilateral upper/lower extremities NEURO: CN II-XII intact, no focal deficits, sensation to light touch intact PSYCH: normal mood/affect Integumentary: dry/intact, no rashes or wounds    The results of significant diagnostics from this hospitalization (including imaging, microbiology, ancillary and laboratory) are listed below for reference.     Microbiology: No results found for this or any previous visit (from the past 240 hours).   Labs: BNP (last 3 results) Recent Labs    09/25/23 1429  BNP 15.8   Basic Metabolic Panel: Recent Labs  Lab 03/19/24 1719 03/19/24 2109 03/20/24 0712 03/21/24 0514  NA 139  --  134* 134*  K 2.5*  --  4.4 4.2  CL 112*  --  103 101  CO2 21*  --  24 25  GLUCOSE 102*  --  90 85  BUN 12  --  11 11  CREATININE 0.62  --  0.82 0.95  CALCIUM  6.1*  --  8.6* 9.0  MG  --  2.2 2.2 2.4  PHOS  --   --   --  4.1   Liver Function Tests: Recent Labs  Lab 03/19/24 1719  AST 23  ALT 26  ALKPHOS 89  BILITOT 0.2  PROT 4.8*  ALBUMIN  2.6*   Recent Labs  Lab 03/19/24 1719  LIPASE 25   No results for input(s): AMMONIA in the last 168 hours. CBC: Recent Labs  Lab 03/19/24 1719 03/20/24 0712 03/21/24 0514  WBC 6.2 7.5 11.4*  NEUTROABS 3.5  --   --   HGB 13.4 13.1 13.5  HCT 40.8 40.5 41.7  MCV 92.5 92.9 94.1  PLT 272 250 260   Cardiac Enzymes: No results for input(s): CKTOTAL, CKMB, CKMBINDEX, TROPONINI in the last 168 hours. BNP: Invalid input(s): POCBNP CBG: No results for input(s): GLUCAP in the last 168 hours. D-Dimer No results for input(s): DDIMER in the last 72 hours. Hgb A1c No results for input(s): HGBA1C in the last 72 hours. Lipid Profile No results for input(s): CHOL, HDL, LDLCALC, TRIG, CHOLHDL, LDLDIRECT in the last 72 hours. Thyroid  function studies No results for input(s): TSH, T4TOTAL, T3FREE, THYROIDAB in the last 72 hours.  Invalid input(s): FREET3 Anemia  work up No results for input(s): VITAMINB12, FOLATE, FERRITIN, TIBC, IRON, RETICCTPCT in the last 72 hours. Urinalysis    Component Value Date/Time   COLORURINE YELLOW 03/19/2024 1719   APPEARANCEUR CLEAR 03/19/2024 1719   LABSPEC 1.022 03/19/2024 1719   PHURINE 5.0 03/19/2024 1719   GLUCOSEU >=500 (A) 03/19/2024 1719   HGBUR NEGATIVE 03/19/2024 1719   BILIRUBINUR NEGATIVE 03/19/2024 1719   KETONESUR NEGATIVE 03/19/2024 1719   PROTEINUR NEGATIVE 03/19/2024 1719   UROBILINOGEN 2.0 (H) 01/30/2012 1606   NITRITE NEGATIVE 03/19/2024 1719   LEUKOCYTESUR NEGATIVE 03/19/2024 1719   Sepsis Labs Recent Labs  Lab 03/19/24 1719 03/20/24 0712 03/21/24 0514  WBC 6.2 7.5 11.4*   Microbiology No results found for this or any previous visit (from the past 240 hours).   Time coordinating discharge: Over 30 minutes  SIGNED:   Camellia PARAS Maylee Bare, DO  [facility] 03/21/2024, 2:35 PM

## 2024-03-21 NOTE — Progress Notes (Signed)
 Progress Note     Subjective: Tolerating soft diet. Has not had a BM since 11/21. Patient denies pain, nausea, vomiting.   ROS  All negative with the exception of above.  Objective: Vital signs in last 24 hours: Temp:  [98 F (36.7 C)-98.4 F (36.9 C)] 98.4 F (36.9 C) (11/24 0920) Pulse Rate:  [74-86] 86 (11/24 0920) Resp:  [17-18] 18 (11/24 0920) BP: (94-106)/(64-78) 99/65 (11/24 0920) SpO2:  [94 %-96 %] 96 % (11/24 0920) Last BM Date : 03/19/24  Intake/Output from previous day: 11/23 0701 - 11/24 0700 In: 860 [P.O.:860] Out: 1400 [Urine:1400] Intake/Output this shift: No intake/output data recorded.  PE: General: Pleasant male who is laying in bed in NAD. HEENT: Head is normocephalic, atraumatic. Heart: HR normal during encounter.  Lungs: Respiratory effort nonlabored. Abd: Soft, NT, ND. No rebound tenderness or guarding.  MS: Able to move all 4 extremities.  Skin: Warm and dry.    Lab Results:  Recent Labs    03/20/24 0712 03/21/24 0514  WBC 7.5 11.4*  HGB 13.1 13.5  HCT 40.5 41.7  PLT 250 260   BMET Recent Labs    03/20/24 0712 03/21/24 0514  NA 134* 134*  K 4.4 4.2  CL 103 101  CO2 24 25  GLUCOSE 90 85  BUN 11 11  CREATININE 0.82 0.95  CALCIUM  8.6* 9.0   PT/INR No results for input(s): LABPROT, INR in the last 72 hours. CMP     Component Value Date/Time   NA 134 (L) 03/21/2024 0514   NA 138 07/08/2022 1513   K 4.2 03/21/2024 0514   CL 101 03/21/2024 0514   CO2 25 03/21/2024 0514   GLUCOSE 85 03/21/2024 0514   BUN 11 03/21/2024 0514   BUN 15 07/08/2022 1513   CREATININE 0.95 03/21/2024 0514   CALCIUM  9.0 03/21/2024 0514   PROT 4.8 (L) 03/19/2024 1719   PROT 7.8 07/08/2022 1513   ALBUMIN  2.6 (L) 03/19/2024 1719   ALBUMIN  4.4 07/08/2022 1513   AST 23 03/19/2024 1719   ALT 26 03/19/2024 1719   ALKPHOS 89 03/19/2024 1719   BILITOT 0.2 03/19/2024 1719   BILITOT 0.7 07/08/2022 1513   GFRNONAA >60 03/21/2024 0514   GFRAA >60  06/01/2019 1436   Lipase     Component Value Date/Time   LIPASE 25 03/19/2024 1719       Studies/Results: CT ABDOMEN PELVIS WO CONTRAST Result Date: 03/20/2024 EXAM: CT ABDOMEN AND PELVIS WITHOUT CONTRAST 03/20/2024 01:31:59 AM TECHNIQUE: CT of the abdomen and pelvis was performed without the administration of intravenous contrast. Multiplanar reformatted images are provided for review. Automated exposure control, iterative reconstruction, and/or weight-based adjustment of the mA/kV was utilized to reduce the radiation dose to as low as reasonably achievable. COMPARISON: 03/19/2024 CLINICAL HISTORY: Peritonitis or perforation suspected; repeat CT a/p with oral contrast. FINDINGS: LOWER CHEST: Dependent atelectasis in the lower lobes. LIVER: The liver is unremarkable. GALLBLADDER AND BILE DUCTS: Prior cholecystectomy. No biliary ductal dilatation. SPLEEN: No acute abnormality. PANCREAS: No acute abnormality. ADRENAL GLANDS: No acute abnormality. KIDNEYS, URETERS AND BLADDER: No stones in the kidneys or ureters. No hydronephrosis. No perinephric or periureteral stranding. Urinary bladder is unremarkable. GI AND BOWEL: Stomach demonstrates no acute abnormality. Small bowel and colonic diverticula. No evidence of active diverticulitis. Mildly prominent small bowel loops, likely mild ileus. Oral contrast material throughout the small bowel. Despite this, exact source of free air not identified. There is no bowel obstruction. PERITONEUM AND RETROPERITONEUM: Free intraperitoneal  air noted in the abdomen. This is unchanged since the prior study. No ascites. VASCULATURE: Aorta is normal in caliber. LYMPH NODES: Enlarged right inguinal lymph node with a short axis diameter of 2 cm. REPRODUCTIVE ORGANS: No acute abnormality. BONES AND SOFT TISSUES: No acute osseous abnormality. No focal soft tissue abnormality. IMPRESSION: 1. Free intraperitoneal air, unchanged from the prior exam; source not identified. 2.  Scattered small bowel and moderate to advanced large bowel diverticulosis. 3. Mild ileus with mildly prominent small bowel loops. 4. Enlarged right inguinal lymph node measuring 2 cm in short axis. Electronically signed by: Franky Crease MD 03/20/2024 01:42 AM EST RP Workstation: [id number]   CT ABDOMEN PELVIS W CONTRAST Result Date: 03/19/2024 EXAM: CT ABDOMEN AND PELVIS WITH CONTRAST 03/19/2024 07:19:48 PM TECHNIQUE: CT of the abdomen and pelvis was performed with the administration of 100 mL of iohexol  (OMNIPAQUE ) 300 MG/ML solution. Multiplanar reformatted images are provided for review. Automated exposure control, iterative reconstruction, and/or weight-based adjustment of the mA/kV was utilized to reduce the radiation dose to as low as reasonably achievable. COMPARISON: 03/16/2024 CLINICAL HISTORY: Pneumoperitoneum noted on outpatient chest CT. FINDINGS: LOWER CHEST: Lung bases show bronchiectasis similar to that seen on recent chest CT. No focal confluent infiltrate is seen. LIVER: The liver is within normal limits. GALLBLADDER AND BILE DUCTS: Gallbladder has been surgically removed. No biliary ductal dilatation. SPLEEN: The spleen is within normal limits. PANCREAS: The pancreas is within normal limits. A large duodenal diverticulum is noted adjacent to the head of the pancreas. ADRENAL GLANDS: The adrenal glands are within normal limits. KIDNEYS, URETERS AND BLADDER: The kidneys demonstrate a normal enhancement pattern bilaterally. No renal calculi or obstructive changes are seen. The bladder is partially distended. GI AND BOWEL: The stomach is well distended with air and fluid. Fecal material is noted throughout the colon. Extensive colonic diverticular change is noted without evidence of diverticulitis. The appendix is not discretely visualized and may have been surgically removed. No inflammatory changes to suggest appendicitis are seen. The small bowel demonstrates multiple sizable small bowel  diverticula. Additionally, there are scattered loops of mildly dilated small bowel identified. PERITONEUM AND RETROPERITONEUM: Free intraperitoneal air is noted throughout the abdomen similar to that seen on prior CT of the chest. The free air has likely resulted from a rupture of a small bowel diverticulum. The definitive area of perforation is difficult to evaluate on this exam. No significant free pelvic fluid is noted. VASCULATURE: The abdominal aorta is within normal limits. LYMPH NODES: No lymphadenopathy is seen. REPRODUCTIVE ORGANS: The prostate is within normal limits. BONES AND SOFT TISSUES: The bony structures show degenerative change of the lumbar spine. No acute osseous abnormality. No focal soft tissue abnormality. IMPRESSION: 1. Free intraperitoneal air, likely from perforated small bowel diverticulum, with the exact perforation site indeterminate on this exam. 2. Fluid-filled, mildly dilated loops of small bowel in the mid abdomen, likely reactive ileus Results were called to Dr. Randol at the time of exam interpretation. Electronically signed by: Oneil Devonshire MD 03/19/2024 07:38 PM EST RP Workstation: [id number]    Anti-infectives: Anti-infectives (From admission, onward)    None        Assessment/Plan Small volume pneumoperitoneum with unclear etiology -Repeat ct 11/23 with oral shows some air, has scattered diverticuli and mild ileus on the ct scan no clear source -Afebrile. -WBC increased to 11.4 from 7.5. -HGB stable at 13.5 -Abdominal exam without significant concerns. Clinically patient remains asymptomatic. -Tolerating soft diet. -No emergent surgery indicated at  this time. -May need endoscopy as outpatient  FEN: Soft; IVF per primary team VTE: DVT ppx recommended. ID: None   LOS: 2 days   I reviewed hospitalist notes, specialist notes, nursing notes, last 24 h vitals and pain scores, last 48 h intake and output, last 24 h labs and trends, and last 24 h imaging  results.  This care required moderate level of medical decision making.    Marjorie Carlyon Favre, [facility] 03/21/2024, 12:28 PM Please see Amion for pager number during day hours 7:00am-4:30pm

## 2024-03-21 NOTE — Plan of Care (Signed)
  Problem: Clinical Measurements: Goal: Ability to maintain clinical measurements within normal limits will improve Outcome: Progressing   Problem: Activity: Goal: Risk for activity intolerance will decrease Outcome: Progressing   Problem: Pain Managment: Goal: General experience of comfort will improve and/or be controlled Outcome: Progressing

## 2024-03-22 ENCOUNTER — Telehealth: Payer: Self-pay | Admitting: *Deleted

## 2024-03-22 NOTE — Transitions of Care (Post Inpatient/ED Visit) (Signed)
   03/22/2024  Name: KAELAN AMBLE MRN: [id number] DOB: [birth year]  Today's TOC FU Call Status: Today's TOC FU Call Status:: Unsuccessful Call (1st Attempt) Unsuccessful Call (1st Attempt) Date: 03/22/24  Attempted to reach the patient regarding the most recent Inpatient/ED visit.  Follow Up Plan: Additional outreach attempts will be made to reach the patient to complete the Transitions of Care (Post Inpatient/ED visit) call.   Andrea Dimes RN, BSN [place]  [facility] RN Care Manager [phone]

## 2024-04-04 ENCOUNTER — Encounter: Admitting: Family

## 2024-04-04 NOTE — Progress Notes (Signed)
 Erroneous encounter-disregard

## 2024-04-08 ENCOUNTER — Ambulatory Visit: Admitting: Family

## 2024-04-08 ENCOUNTER — Encounter: Payer: Self-pay | Admitting: Family

## 2024-04-08 VITALS — BP 106/73 | HR 104 | Temp 97.8°F | Resp 16 | Ht 73.0 in | Wt 178.0 lb

## 2024-04-08 DIAGNOSIS — Z951 Presence of aortocoronary bypass graft: Secondary | ICD-10-CM | POA: Diagnosis not present

## 2024-04-08 DIAGNOSIS — K668 Other specified disorders of peritoneum: Secondary | ICD-10-CM | POA: Diagnosis not present

## 2024-04-08 DIAGNOSIS — E876 Hypokalemia: Secondary | ICD-10-CM | POA: Diagnosis not present

## 2024-04-08 DIAGNOSIS — I2510 Atherosclerotic heart disease of native coronary artery without angina pectoris: Secondary | ICD-10-CM

## 2024-04-08 DIAGNOSIS — Z09 Encounter for follow-up examination after completed treatment for conditions other than malignant neoplasm: Secondary | ICD-10-CM | POA: Diagnosis not present

## 2024-04-08 DIAGNOSIS — I509 Heart failure, unspecified: Secondary | ICD-10-CM | POA: Diagnosis not present

## 2024-04-08 DIAGNOSIS — E785 Hyperlipidemia, unspecified: Secondary | ICD-10-CM

## 2024-04-08 DIAGNOSIS — J849 Interstitial pulmonary disease, unspecified: Secondary | ICD-10-CM | POA: Diagnosis not present

## 2024-04-08 DIAGNOSIS — E039 Hypothyroidism, unspecified: Secondary | ICD-10-CM | POA: Diagnosis not present

## 2024-04-08 NOTE — Progress Notes (Signed)
 Patient ID: Jonathan King, male    DOB: 09/18/1965  MRN: [id number]  CC: Hospital Discharge Follow-Up  Subjective: Jonathan King is a 58 y.o. male who presents for hospital discharge follow-up. He is accompanied by a visitor.   His concerns today include:  Patient seen on 03/19/2024 - 03/21/2024 (2 days) at [facility] for bowel perforation and additional diagnoses. Today reports feeling improved and denies red flag symptoms. Doing well on medication regimen, no issues/concerns. States since hospital discharge he did not follow-up with Gastroenterology. Established with Pulmonology.   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   SOB (shortness of breath)    Aspiration pneumonia of right lower lobe (HCC)    Lung mass    Hypokalemia 12/12/2018   Hyponatremia 12/12/2018   Hypochloremia 12/12/2018   Elevated ALT measurement 12/12/2018   Leukocytosis 12/12/2018   Elevated TSH 12/12/2018   NSVT (nonsustained ventricular tachycardia) (HCC) 12/11/2018   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) 12/11/2018   Pre-diabetes 12/11/2018   GERD (gastroesophageal reflux disease) 12/11/2018   Cough 12/11/2018   Chronic heart failure with mildly reduced ejection fraction (HFmrEF) (HCC) 12/10/2018   MRSA infection 05/23/2013   Acute cholecystitis 01/30/2012     Medications Ordered Prior to Encounter[1]  Allergies[2]  Social History   Socioeconomic History   Marital status: Single    Spouse name: Not on file   Number of children: Not on file   Years of education: Not on file   Highest education level: Not on file  Occupational History   Not on file  Tobacco Use   Smoking status: Never   Smokeless tobacco: Never   Vaping Use   Vaping status: Never Used  Substance and Sexual Activity   Alcohol use: Yes    Comment: occ   Drug use: No   Sexual activity: Not on file  Other Topics Concern   Not on file  Social History Narrative   Not on file   Social Drivers of Health   Tobacco Use: Low Risk (04/08/2024)   Patient History    Smoking Tobacco Use: Never    Smokeless Tobacco Use: Never    Passive Exposure: Not on file  Financial Resource Strain: Patient Declined (09/10/2023)   Overall Financial Resource Strain (CARDIA)    Difficulty of Paying Living Expenses: Patient declined  Food Insecurity: No Food Insecurity (03/19/2024)   Epic    Worried About Radiation Protection Practitioner of Food in the Last Year: Never true    [employer] of Food in the Last Year: Never true  Transportation Needs: No Transportation Needs (03/19/2024)   Epic    Lack of Transportation (Medical): No    Lack of Transportation (Non-Medical): No  Physical Activity: Unknown (09/10/2023)   Exercise Vital Sign    Days of Exercise per Week: 1 day    Minutes of Exercise per Session: Patient declined  Stress: No Stress Concern Present (09/10/2023)   [employer] of Occupational Health - Occupational Stress Questionnaire    Feeling of Stress : Not at all  Social Connections: Socially Isolated (09/10/2023)   Social Connection and Isolation Panel    Frequency of Communication with Friends and Family: Never    Frequency of Social Gatherings with Friends  and Family: Never    Attends Religious Services: Never    Active Member of Clubs or Organizations: No    Attends Banker Meetings: Never    Marital Status: Divorced  Catering Manager Violence: Not At Risk (03/19/2024)   Epic    Fear of Current or Ex-Partner: No    Emotionally Abused: No    Physically Abused: No    Sexually Abused: No  Depression (PHQ2-9): Low Risk (04/08/2024)   Depression (PHQ2-9)    PHQ-2 Score: 0  Alcohol Screen: Low Risk (03/17/2023)   Alcohol Screen     Last Alcohol Screening Score (AUDIT): 0  Housing: Low Risk (03/19/2024)   Epic    Unable to Pay for Housing in the Last Year: No    Number of Times Moved in the Last Year: 0    Homeless in the Last Year: No  Utilities: Not At Risk (03/19/2024)   Epic    Threatened with loss of utilities: No  Health Literacy: Adequate Health Literacy (03/17/2023)   B1300 Health Literacy    Frequency of need for help with medical instructions: Never    Family History  Problem Relation Age of Onset   Parkinson's disease Mother     Past Surgical History:  Procedure Laterality Date   CARDIAC SURGERY     CHOLECYSTECTOMY  01/31/2012   Procedure: LAPAROSCOPIC CHOLECYSTECTOMY;  Surgeon: Camellia CHRISTELLA Blush, MD,FACS;  Location: [facility] OR;  Service: General;  Laterality: N/A;   CORONARY ARTERY BYPASS GRAFT N/A 12/21/2018   Procedure: CORONARY ARTERY BYPASS GRAFTING (CABG) x 3, USING LEFT INTERNAL MAMMARY ARTERY (LIMA) AND RIGHT GREATER SAPHENOUS VEIN (SVG) HARVESTED ENDOSCOPICALLY;    LIMA-LAD, SVG-RAMUS, SVG-DIAG;  Surgeon: Fleeta Hanford Coy, MD;  Location: [facility] OR;  Service: Open Heart Surgery;  Laterality: N/A;   RIGHT/LEFT HEART CATH AND CORONARY ANGIOGRAPHY N/A 12/16/2018   Procedure: RIGHT/LEFT HEART CATH AND CORONARY ANGIOGRAPHY;  Surgeon: Cherrie Toribio SAUNDERS, MD;  Location: [facility] INVASIVE CV LAB;  Service: Cardiovascular;  Laterality: N/A;   TEE WITHOUT CARDIOVERSION N/A 12/21/2018   Procedure: TRANSESOPHAGEAL ECHOCARDIOGRAM (TEE);  Surgeon: Fleeta Hanford, Coy, MD;  Location[facility];  Service: Open Heart Surgery;  Laterality: N/A;    ROS: Review of Systems Negative except as stated above  PHYSICAL EXAM: BP 106/73   Pulse (!) 104   Temp 97.8 F (36.6 C) (Oral)   Resp 16   Ht 6' 1 (1.854 m)   Wt 178 lb (80.7 kg)   SpO2 93%   BMI 23.48 kg/m   Physical Exam HENT:     Head: Normocephalic and atraumatic.     Nose: Nose normal.     Mouth/Throat:     Mouth: Mucous membranes are moist.     Pharynx: Oropharynx  is clear.  Eyes:     Extraocular Movements: Extraocular movements intact.     Conjunctiva/sclera: Conjunctivae normal.     Pupils: Pupils are equal, round, and reactive to light.  Cardiovascular:     Rate and Rhythm: Tachycardia present.     Pulses: Normal pulses.     Heart sounds: Normal heart sounds.  Pulmonary:     Effort: Pulmonary effort is normal.     Breath sounds: Normal breath sounds.  Abdominal:     General: Bowel sounds are normal.     Palpations: Abdomen is soft.  Musculoskeletal:        General: Normal range of motion.     Cervical back: Normal range of motion and neck supple.  Neurological:     General: No focal deficit present.     Mental Status: He is alert and oriented to person, place, and time.  Psychiatric:        Mood and Affect: Mood normal.        Behavior: Behavior normal.    ASSESSMENT AND PLAN: 1. Hospital discharge follow-up (Primary) - Reviewed hospital course, current medications, ensured proper follow-up in place, and addressed concerns.   2. Pneumoperitoneum - Continue present management.  - Referral to Gastroenterology for evaluation/management.  - Follow-up with primary provider as scheduled.  - Ambulatory referral to Gastroenterology  3. Hypokalemia 4. Hypocalcemia - Repleted prior to hospital discharge.   5. Coronary artery disease, unspecified vessel or lesion type, unspecified whether angina present, unspecified whether native or transplanted heart 6. S/P CABG (coronary artery bypass graft) 7. Heart failure, unspecified HF chronicity, unspecified heart failure type (HCC) 8. Hyperlipidemia, unspecified hyperlipidemia type - Continue present management.  - Referral to Cardiology for evaluation/management. - Follow-up with primary provider as scheduled.  - Ambulatory referral to Cardiology  9. Hypothyroidism, unspecified type - Continue present management. - Follow-up with primary provider as scheduled.   10. Interstitial lung  disease (HCC) - Continue present management.  - Keep all scheduled appointments with established Pulmonology.   Patient was given the opportunity to ask questions.  Patient verbalized understanding of the plan and was able to repeat key elements of the plan. Patient was given clear instructions to go to Emergency Department or return to medical center if symptoms don't improve, worsen, or new problems develop.The patient verbalized understanding.   Orders Placed This Encounter  Procedures   Ambulatory referral to Cardiology   Ambulatory referral to Gastroenterology   Return for Follow-Up or next available with Enobong Newlin, MD.  Greig JINNY Chute, NP      [1]  Current Outpatient Medications on File Prior to Visit  Medication Sig Dispense Refill   aspirin  EC 81 MG tablet Take 1 tablet (81 mg total) by mouth daily. 30 tablet 11   atorvastatin  (LIPITOR ) 80 MG tablet Take 1 tablet (80 mg total) by mouth daily. 30 tablet 11   benzonatate  (TESSALON ) 200 MG capsule Take 1 capsule (200 mg total) by mouth 3 (three) times daily as needed for cough. 90 capsule 2   carvedilol  (COREG ) 3.125 MG tablet Take 1 tablet (3.125 mg total) by mouth 2 (two) times daily. 60 tablet 3   cetirizine  (ZYRTEC ) 10 MG tablet Take 1 tablet (10 mg total) by mouth daily. 90 tablet 2   dapagliflozin  propanediol (FARXIGA ) 10 MG TABS tablet Take 1 tablet (10 mg total) by mouth daily before breakfast. 30 tablet 11   levothyroxine  (SYNTHROID ) 50 MCG tablet Take 1 tablet (50 mcg total) by mouth daily before breakfast. 30 tablet 6   mexiletine (MEXITIL ) 150 MG capsule Take 2 capsules (300 mg total) by mouth 2 (two) times daily. 120 capsule 8   potassium chloride  (KLOR-CON ) 10 MEQ tablet Take 1 tablet (10 mEq total) by mouth daily. Needs appt for further refills 90 tablet 3   sacubitril -valsartan  (ENTRESTO ) 24-26 MG Take 1 tablet by mouth 2 (two) times daily. 180 tablet 3   spironolactone  (ALDACTONE ) 25 MG tablet Take 1 tablet  (25 mg total) by mouth daily. 90 tablet 3   umeclidinium-vilanterol (ANORO ELLIPTA ) 62.5-25 MCG/ACT AEPB Inhale 1 puff into the lungs daily. 60 each 3   No current facility-administered medications on file prior to visit.  [2] No Known Allergies

## 2024-04-08 NOTE — Progress Notes (Signed)
 Hospital follow up?

## 2024-04-11 ENCOUNTER — Other Ambulatory Visit: Payer: Self-pay

## 2024-04-18 ENCOUNTER — Other Ambulatory Visit: Payer: Self-pay

## 2024-04-25 ENCOUNTER — Other Ambulatory Visit: Payer: Self-pay

## 2024-05-02 ENCOUNTER — Other Ambulatory Visit: Payer: Self-pay

## 2024-05-10 ENCOUNTER — Other Ambulatory Visit: Payer: Self-pay | Admitting: Family Medicine

## 2024-05-11 ENCOUNTER — Other Ambulatory Visit: Payer: Self-pay

## 2024-05-11 MED ORDER — LEVOTHYROXINE SODIUM 50 MCG PO TABS
50.0000 ug | ORAL_TABLET | Freq: Every day | ORAL | 0 refills | Status: AC
  Filled 2024-05-11: qty 30, 30d supply, fill #0

## 2024-05-18 ENCOUNTER — Encounter: Payer: Self-pay | Admitting: Pulmonary Disease

## 2024-05-18 ENCOUNTER — Ambulatory Visit: Admitting: Pulmonary Disease

## 2024-05-18 ENCOUNTER — Ambulatory Visit

## 2024-05-18 ENCOUNTER — Other Ambulatory Visit: Payer: Self-pay

## 2024-05-18 VITALS — BP 97/63 | HR 79 | Temp 97.7°F | Ht 72.0 in | Wt 181.0 lb

## 2024-05-18 DIAGNOSIS — J849 Interstitial pulmonary disease, unspecified: Secondary | ICD-10-CM

## 2024-05-18 DIAGNOSIS — R053 Chronic cough: Secondary | ICD-10-CM

## 2024-05-18 DIAGNOSIS — R918 Other nonspecific abnormal finding of lung field: Secondary | ICD-10-CM

## 2024-05-18 LAB — PULMONARY FUNCTION TEST
DL/VA % pred: 65 %
DL/VA: 2.77 ml/min/mmHg/L
DLCO cor % pred: 64 %
DLCO cor: 18.63 ml/min/mmHg
DLCO unc % pred: 62 %
DLCO unc: 18.03 ml/min/mmHg
FEF 25-75 Post: 5.46 L/s
FEF 25-75 Pre: 4.75 L/s
FEF2575-%Change-Post: 14 %
FEF2575-%Pred-Post: 172 %
FEF2575-%Pred-Pre: 150 %
FEV1-%Change-Post: 6 %
FEV1-%Pred-Post: 112 %
FEV1-%Pred-Pre: 105 %
FEV1-Post: 4.27 L
FEV1-Pre: 4 L
FEV1FVC-%Change-Post: -2 %
FEV1FVC-%Pred-Pre: 111 %
FEV6-%Change-Post: 9 %
FEV6-%Pred-Post: 107 %
FEV6-%Pred-Pre: 98 %
FEV6-Post: 5.16 L
FEV6-Pre: 4.7 L
FEV6FVC-%Pred-Post: 104 %
FEV6FVC-%Pred-Pre: 104 %
FVC-%Change-Post: 9 %
FVC-%Pred-Post: 103 %
FVC-%Pred-Pre: 94 %
FVC-Post: 5.16 L
FVC-Pre: 4.7 L
Post FEV1/FVC ratio: 83 %
Post FEV6/FVC ratio: 100 %
Pre FEV1/FVC ratio: 85 %
Pre FEV6/FVC Ratio: 100 %
RV % pred: 88 %
RV: 1.99 L
TLC % pred: 95 %
TLC: 6.85 L

## 2024-05-18 NOTE — Progress Notes (Signed)
 "               Jonathan King    [id number]    [birth year]  Primary Care Physician:Newlin, Corrina, MD  Referring Physician: Neda Jennet LABOR, MD [address] [place],  [place] [address]  Chief complaint:   Follow-up for chronic cough HPI:  He feels well Not short of breath  Chronic cough with minimal sputum production  Recently had a pulmonary function test showing no obstruction, no restriction, no significant bronchodilator response with normal diffusing capacity Comparing his current PFT with 1 performed 5 years ago there is significant improvement  He had no fevers, no chills, not feeling acutely ill  Most recent CT scan does show some bronchiectatic changes and some coarsening of the interstitial, when this was compared with a CAT scan performed about 5 years ago look like he may have had some interstitial process, organizing pneumonia at the time  He denies any significant discomfort at the present time, no symptoms of reflux, no skin rash, no joint or muscle aches  Never smoker  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   atorvastatin  (LIPITOR ) 80 MG tablet Take 1 tablet (80 mg total) by mouth daily.   carvedilol  (COREG ) 3.125 MG tablet Take 1 tablet (3.125 mg total) by mouth 2 (two) times daily.   cetirizine  (ZYRTEC ) 10 MG tablet Take 1 tablet (10 mg total) by mouth daily.   dapagliflozin  propanediol (FARXIGA ) 10 MG TABS tablet Take 1 tablet (10 mg total) by mouth daily before breakfast.   levothyroxine  (SYNTHROID ) 50 MCG tablet Take 1 tablet (50 mcg total) by mouth daily before breakfast.   mexiletine (MEXITIL ) 150 MG capsule Take 2 capsules (300 mg total) by mouth 2 (two) times daily.   potassium chloride  (KLOR-CON ) 10 MEQ tablet Take 1 tablet (10 mEq total) by mouth  daily. Needs appt for further refills   sacubitril -valsartan  (ENTRESTO ) 24-26 MG Take 1 tablet by mouth 2 (two) times daily.   spironolactone  (ALDACTONE ) 25 MG tablet Take 1 tablet (25 mg total) by mouth daily.   [DISCONTINUED] benzonatate  (TESSALON ) 200 MG capsule Take 1 capsule (200 mg total) by mouth 3 (three) times daily as needed for cough.   [DISCONTINUED] umeclidinium-vilanterol (ANORO ELLIPTA ) 62.5-25 MCG/ACT AEPB Inhale 1 puff into the lungs daily.   No facility-administered encounter medications on file as of 05/18/2024.    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    Past Surgical History:  Procedure Laterality Date   CARDIAC SURGERY     CHOLECYSTECTOMY  01/31/2012   Procedure: LAPAROSCOPIC CHOLECYSTECTOMY;  Surgeon: Camellia CHRISTELLA Blush, MD,FACS;  Location: [facility] OR;  Service: General;  Laterality: N/A;   CORONARY ARTERY BYPASS GRAFT N/A 12/21/2018   Procedure: CORONARY ARTERY BYPASS GRAFTING (CABG) x 3, USING LEFT INTERNAL MAMMARY ARTERY (LIMA) AND RIGHT GREATER SAPHENOUS VEIN (SVG) HARVESTED ENDOSCOPICALLY;    LIMA-LAD, SVG-RAMUS, SVG-DIAG;  Surgeon: Fleeta Hanford Coy, MD;  Location: [facility] OR;  Service: Open Heart Surgery;  Laterality: N/A;   RIGHT/LEFT HEART CATH AND CORONARY ANGIOGRAPHY N/A 12/16/2018   Procedure: RIGHT/LEFT HEART CATH  AND CORONARY ANGIOGRAPHY;  Surgeon: Cherrie Toribio SAUNDERS, MD;  Location: [facility];  Service: Cardiovascular;  Laterality: N/A;   TEE WITHOUT CARDIOVERSION N/A 12/21/2018   Procedure: TRANSESOPHAGEAL ECHOCARDIOGRAM (TEE);  Surgeon: Fleeta Ochoa, Maude, MD;  Location: [facility] OR;  Service: Open Heart Surgery;  Laterality: N/A;    Family History  Problem Relation Age of Onset   Parkinson's disease Mother     Social History   Socioeconomic History   Marital status: Single    Spouse name: Not on file   Number of  children: Not on file   Years of education: Not on file   Highest education level: Not on file  Occupational History   Not on file  Tobacco Use   Smoking status: Never   Smokeless tobacco: Never  Vaping Use   Vaping status: Never Used  Substance and Sexual Activity   Alcohol use: Yes    Comment: occ   Drug use: No   Sexual activity: Not on file  Other Topics Concern   Not on file  Social History Narrative   Not on file   Social Drivers of Health   Tobacco Use: Low Risk (05/18/2024)   Patient History    Smoking Tobacco Use: Never    Smokeless Tobacco Use: Never    Passive Exposure: Not on file  Financial Resource Strain: Patient Declined (09/10/2023)   Overall Financial Resource Strain (CARDIA)    Difficulty of Paying Living Expenses: Patient declined  Food Insecurity: No Food Insecurity (03/19/2024)   Epic    Worried About Radiation Protection Practitioner of Food in the Last Year: Never true    Ran Out of Food in the Last Year: Never true  Transportation Needs: No Transportation Needs (03/19/2024)   Epic    Lack of Transportation (Medical): No    Lack of Transportation (Non-Medical): No  Physical Activity: Unknown (09/10/2023)   Exercise Vital Sign    Days of Exercise per Week: 1 day    Minutes of Exercise per Session: Patient declined  Stress: No Stress Concern Present (09/10/2023)   [employer] of Occupational Health - Occupational Stress Questionnaire    Feeling of Stress : Not at all  Social Connections: Socially Isolated (09/10/2023)   Social Connection and Isolation Panel    Frequency of Communication with Friends and Family: Never    Frequency of Social Gatherings with Friends and Family: Never    Attends Religious Services: Never    Database Administrator or Organizations: No    Attends Banker Meetings: Never    Marital Status: Divorced  Catering Manager Violence: Not At Risk (03/19/2024)   Epic    Fear of Current or Ex-Partner: No    Emotionally Abused: No     Physically Abused: No    Sexually Abused: No  Depression (PHQ2-9): Low Risk (04/08/2024)   Depression (PHQ2-9)    PHQ-2 Score: 0  Alcohol Screen: Low Risk (03/17/2023)   Alcohol Screen    Last Alcohol Screening Score (AUDIT): 0  Housing: Low Risk (03/19/2024)   Epic    Unable to Pay for Housing in the Last Year: No    Number of Times Moved in the Last Year: 0    Homeless in the Last Year: No  Utilities: Not At Risk (03/19/2024)   Epic    Threatened with loss of utilities: No  Health Literacy: Adequate Health Literacy (03/17/2023)   B1300 Health Literacy    Frequency of need for help with medical  instructions: Never    Review of Systems  Constitutional:  Negative for fatigue.  Respiratory:  Positive for cough. Negative for shortness of breath.   Psychiatric/Behavioral:  Negative for sleep disturbance.     Vitals:   05/18/24 1605  BP: 97/63  Pulse: 79  Temp: 97.7 F (36.5 C)  SpO2: 94%     Physical Exam Constitutional:      Appearance: Normal appearance.  HENT:     Head: Normocephalic.     Mouth/Throat:     Mouth: Mucous membranes are moist.  Eyes:     General: No scleral icterus. Cardiovascular:     Rate and Rhythm: Normal rate and regular rhythm.     Heart sounds: No murmur heard.    No friction rub.  Pulmonary:     Effort: No respiratory distress.     Breath sounds: No stridor. Rales present. No wheezing or rhonchi.  Musculoskeletal:     Cervical back: Normal range of motion. No rigidity or tenderness.  Skin:    General: Skin is warm.  Neurological:     Mental Status: He is alert.  Psychiatric:        Mood and Affect: Mood normal.    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  Some bronchiectatic changes  Not consistent with idiopathic pulmonary fibrosis  Largely asymptomatic  Plan/Recommendations: Follow-up a year from now  Will repeat his CT at that time  He was tried empirically on bronchodilators, antimuscarinic which he is not using, did not feel it helped at all  Yearly follow-up will be appropriate  Encouraged to call with significant concerns  I personally spent a total of 30 minutes in the care of the patient today including preparing to see the patient, getting/reviewing separately obtained history, performing a medically appropriate exam/evaluation, counseling and educating, referring and communicating with other health care professionals, documenting clinical information in the EHR, and independently interpreting results.     Jennet Epley MD [facility] 05/18/2024, 4:18 PM  CC: Epley Jennet LABOR, MD   "

## 2024-05-18 NOTE — Progress Notes (Signed)
 Full pft performed today

## 2024-05-18 NOTE — Patient Instructions (Signed)
 Your breathing study is stable from a year ago and much improved compared to when you had 5 years ago  Your CT scan is also compared looks better  I do not believe we need to make any changes at present  I will see you a year from now  We will repeat your breathing study prior to the next visit  Call us  with significant concerns

## 2024-05-18 NOTE — Patient Instructions (Signed)
 Full pft performed today

## 2024-05-27 ENCOUNTER — Other Ambulatory Visit: Payer: Self-pay

## 2024-05-31 ENCOUNTER — Other Ambulatory Visit: Payer: Self-pay | Admitting: Family Medicine

## 2024-05-31 DIAGNOSIS — R053 Chronic cough: Secondary | ICD-10-CM

## 2024-06-01 ENCOUNTER — Other Ambulatory Visit: Payer: Self-pay

## 2024-06-01 MED ORDER — CETIRIZINE HCL 10 MG PO TABS
10.0000 mg | ORAL_TABLET | Freq: Every day | ORAL | 2 refills | Status: AC
  Filled 2024-06-01: qty 90, 90d supply, fill #0
